# Patient Record
Sex: Male | Born: 1985 | Race: White | Hispanic: No | Smoking: Current every day smoker
Health system: Southern US, Community
[De-identification: ages and names within clinical notes are randomized; demographics above are authoritative.]

## PROBLEM LIST (undated history)

## (undated) DIAGNOSIS — R011 Cardiac murmur, unspecified: Secondary | ICD-10-CM

## (undated) DIAGNOSIS — M1990 Unspecified osteoarthritis, unspecified site: Secondary | ICD-10-CM

## (undated) DIAGNOSIS — F32A Depression, unspecified: Secondary | ICD-10-CM

## (undated) DIAGNOSIS — R519 Headache, unspecified: Secondary | ICD-10-CM

## (undated) DIAGNOSIS — R748 Abnormal levels of other serum enzymes: Secondary | ICD-10-CM

## (undated) DIAGNOSIS — M4800 Spinal stenosis, site unspecified: Secondary | ICD-10-CM

## (undated) DIAGNOSIS — F419 Anxiety disorder, unspecified: Secondary | ICD-10-CM

## (undated) DIAGNOSIS — T7840XA Allergy, unspecified, initial encounter: Secondary | ICD-10-CM

## (undated) DIAGNOSIS — B019 Varicella without complication: Secondary | ICD-10-CM

## (undated) DIAGNOSIS — R51 Headache: Secondary | ICD-10-CM

## (undated) HISTORY — PX: ADENOIDECTOMY: SUR15

## (undated) HISTORY — DX: Anxiety disorder, unspecified: F41.9

## (undated) HISTORY — DX: Allergy, unspecified, initial encounter: T78.40XA

## (undated) HISTORY — PX: TONSILLECTOMY: SUR1361

## (undated) HISTORY — DX: Varicella without complication: B01.9

## (undated) HISTORY — PX: SPINE SURGERY: SHX786

## (undated) HISTORY — DX: Depression, unspecified: F32.A

---
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
## 2008-03-02 ENCOUNTER — Emergency Department (HOSPITAL_COMMUNITY): Admission: EM | Admit: 2008-03-02 | Discharge: 2008-03-02 | Payer: Self-pay | Admitting: Emergency Medicine

## 2011-02-08 ENCOUNTER — Emergency Department (HOSPITAL_COMMUNITY)
Admission: EM | Admit: 2011-02-08 | Discharge: 2011-02-08 | Disposition: A | Discharge status: Home / Self Care | Payer: Self-pay | Attending: Emergency Medicine | Admitting: Emergency Medicine

## 2011-02-08 DIAGNOSIS — M545 Low back pain, unspecified: Secondary | ICD-10-CM | POA: Insufficient documentation

## 2011-02-08 DIAGNOSIS — S335XXA Sprain of ligaments of lumbar spine, initial encounter: Secondary | ICD-10-CM | POA: Insufficient documentation

## 2011-02-08 DIAGNOSIS — X58XXXA Exposure to other specified factors, initial encounter: Secondary | ICD-10-CM | POA: Insufficient documentation

## 2012-09-02 ENCOUNTER — Emergency Department (HOSPITAL_BASED_OUTPATIENT_CLINIC_OR_DEPARTMENT_OTHER): Payer: No Typology Code available for payment source

## 2012-09-02 ENCOUNTER — Emergency Department (HOSPITAL_BASED_OUTPATIENT_CLINIC_OR_DEPARTMENT_OTHER)
Admission: EM | Admit: 2012-09-02 | Discharge: 2012-09-02 | Disposition: A | Discharge status: Home / Self Care | Payer: No Typology Code available for payment source | Attending: Emergency Medicine | Admitting: Emergency Medicine

## 2012-09-02 ENCOUNTER — Encounter (HOSPITAL_BASED_OUTPATIENT_CLINIC_OR_DEPARTMENT_OTHER): Payer: Self-pay | Admitting: *Deleted

## 2012-09-02 DIAGNOSIS — F17200 Nicotine dependence, unspecified, uncomplicated: Secondary | ICD-10-CM | POA: Insufficient documentation

## 2012-09-02 DIAGNOSIS — R011 Cardiac murmur, unspecified: Secondary | ICD-10-CM | POA: Insufficient documentation

## 2012-09-02 DIAGNOSIS — Y9389 Activity, other specified: Secondary | ICD-10-CM | POA: Insufficient documentation

## 2012-09-02 DIAGNOSIS — R51 Headache: Secondary | ICD-10-CM | POA: Insufficient documentation

## 2012-09-02 DIAGNOSIS — Y92410 Unspecified street and highway as the place of occurrence of the external cause: Secondary | ICD-10-CM | POA: Insufficient documentation

## 2012-09-02 DIAGNOSIS — S139XXA Sprain of joints and ligaments of unspecified parts of neck, initial encounter: Secondary | ICD-10-CM | POA: Insufficient documentation

## 2012-09-02 DIAGNOSIS — M2550 Pain in unspecified joint: Secondary | ICD-10-CM | POA: Insufficient documentation

## 2012-09-02 DIAGNOSIS — S134XXA Sprain of ligaments of cervical spine, initial encounter: Secondary | ICD-10-CM

## 2012-09-02 HISTORY — DX: Cardiac murmur, unspecified: R01.1

## 2012-09-02 MED ORDER — HYDROCODONE-ACETAMINOPHEN 5-325 MG PO TABS
ORAL_TABLET | ORAL | Status: AC
  Administered 2012-09-02: 1
  Filled 2012-09-02: qty 1

## 2012-09-02 MED ORDER — CYCLOBENZAPRINE HCL 10 MG PO TABS: 10.0000 mg | ORAL_TABLET | Freq: Two times a day (BID) | ORAL | Status: DC | PRN

## 2012-09-02 MED ORDER — OXYCODONE-ACETAMINOPHEN 5-325 MG PO TABS: 1.0000 | ORAL_TABLET | Freq: Four times a day (QID) | ORAL | Status: DC | PRN

## 2012-09-02 MED ORDER — MORPHINE SULFATE 2 MG/ML IJ SOLN
2.0000 mg | Freq: Once | INTRAMUSCULAR | Status: AC
  Administered 2012-09-02: 2 mg via INTRAVENOUS
  Filled 2012-09-02: qty 1
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
## 2012-09-03 NOTE — ED Provider Notes (Signed)
Medical screening examination/treatment/procedure(s) were performed by non-physician practitioner and as supervising physician I was immediately available for consultation/collaboration.  Hurman Horn, MD 09/03/12 [phone]

## 2012-09-25 ENCOUNTER — Emergency Department (HOSPITAL_COMMUNITY)
Admission: EM | Admit: 2012-09-25 | Discharge: 2012-09-25 | Disposition: A | Discharge status: Home / Self Care | Payer: BC Managed Care – PPO | Attending: Emergency Medicine | Admitting: Emergency Medicine

## 2012-09-25 ENCOUNTER — Encounter (HOSPITAL_COMMUNITY): Payer: Self-pay | Admitting: *Deleted

## 2012-09-25 DIAGNOSIS — R059 Cough, unspecified: Secondary | ICD-10-CM | POA: Insufficient documentation

## 2012-09-25 DIAGNOSIS — R05 Cough: Secondary | ICD-10-CM | POA: Insufficient documentation

## 2012-09-25 DIAGNOSIS — J111 Influenza due to unidentified influenza virus with other respiratory manifestations: Secondary | ICD-10-CM | POA: Insufficient documentation

## 2012-09-25 DIAGNOSIS — F17200 Nicotine dependence, unspecified, uncomplicated: Secondary | ICD-10-CM | POA: Insufficient documentation

## 2012-09-25 DIAGNOSIS — Z8679 Personal history of other diseases of the circulatory system: Secondary | ICD-10-CM | POA: Insufficient documentation

## 2012-09-25 MED ORDER — OSELTAMIVIR PHOSPHATE 75 MG PO CAPS: ORAL_CAPSULE | ORAL | Status: DC

## 2012-09-25 NOTE — ED Notes (Signed)
Cough since last night.  This am fever of 103., sore throat nonproductive cough.

## 2012-09-25 NOTE — ED Notes (Signed)
Pt states that he woke up this am with fever of 103, did take 800mg  ibu and two tablets of daytime cold and flu prior to arrival in er, cough that started last night, is non productive, runny nose, watery eyes, denies diarrhea, states that he did have one episode of vomiting just "a little" when he was coughing this am

## 2012-09-25 NOTE — ED Provider Notes (Signed)
Medical screening examination/treatment/procedure(s) were performed by non-physician practitioner and as supervising physician I was immediately available for consultation/collaboration.  Wetona Viramontes R. Baani Bober, MD 09/25/12 2319 

## 2012-09-25 NOTE — ED Provider Notes (Signed)
History     CSN: [id number]  Arrival date & time 09/25/12  1454   First MD Initiated Contact with Patient 09/25/12 1653      Chief Complaint  Patient presents with  . Fever  . Cough    (Consider location/radiation/quality/duration/timing/severity/associated sxs/prior treatment) Patient is a 27 y.o. male presenting with fever and cough. The history is provided by the patient.  Fever Primary symptoms of the febrile illness include fever, fatigue and cough. Primary symptoms do not include headaches, wheezing, shortness of breath, abdominal pain, nausea, vomiting, dysuria, myalgias or arthralgias. The current episode started yesterday. This is a new problem.  The fever began yesterday. The fever has been gradually improving since its onset. The maximum temperature recorded prior to his arrival was 103 to 104 F. The temperature was taken by an oral thermometer.  Cough Pertinent negatives include no chest pain, no headaches, no myalgias, no shortness of breath and no wheezing.    Past Medical History  Diagnosis Date  . Heart murmur     Past Surgical History  Procedure Date  . Tonsillectomy   . Adenoidectomy     No family history on file.  History  Substance Use Topics  . Smoking status: Current Every Day Smoker  . Smokeless tobacco: Not on file  . Alcohol Use: No      Review of Systems  Constitutional: Positive for fever and fatigue. Negative for activity change.       All ROS Neg except as noted in HPI  HENT: Negative for nosebleeds and neck pain.   Eyes: Negative for photophobia and discharge.  Respiratory: Positive for cough. Negative for shortness of breath and wheezing.   Cardiovascular: Negative for chest pain and palpitations.  Gastrointestinal: Negative for nausea, vomiting, abdominal pain and blood in stool.  Genitourinary: Negative for dysuria, frequency and hematuria.  Musculoskeletal: Negative for myalgias, back pain and arthralgias.  Skin: Negative.     Neurological: Negative for dizziness, seizures, speech difficulty and headaches.  Psychiatric/Behavioral: Negative for hallucinations and confusion.    Allergies  Review of patient's allergies indicates no known allergies.  Home Medications   Current Outpatient Rx  Name  Route  Sig  Dispense  Refill  . IBUPROFEN 800 MG PO TABS   Oral   Take 800 mg by mouth every 8 (eight) hours as needed. fever         . DAYQUIL/NYQUIL COLD/FLU RELIEF PO   Oral   Take 1 tablet by mouth every 6 (six) hours as needed. Fever/aches and pain           BP 156/76  Pulse 106  Temp 98 F (36.7 C) (Oral)  Resp 20  Ht 6\' 2"  (1.88 m)  Wt 185 lb (83.915 kg)  BMI 23.75 kg/m2  SpO2 98%  Physical Exam  Nursing note and vitals reviewed. Constitutional: He is oriented to person, place, and time. He appears well-developed and well-nourished.  Non-toxic appearance.  HENT:  Head: Normocephalic.  Right Ear: Tympanic membrane and external ear normal.  Left Ear: Tympanic membrane and external ear normal.       Nasal congestion  Eyes: EOM and lids are normal. Pupils are equal, round, and reactive to light.  Neck: Normal range of motion. Neck supple. Carotid bruit is not present.  Cardiovascular: Normal heart sounds, intact distal pulses and normal pulses.  Tachycardia present.   Pulmonary/Chest: Breath sounds normal. No respiratory distress.  Abdominal: Soft. Bowel sounds are normal. There is no tenderness. There  is no guarding.  Musculoskeletal: Normal range of motion.  Lymphadenopathy:       Head (right side): No submandibular adenopathy present.       Head (left side): No submandibular adenopathy present.    He has no cervical adenopathy.  Neurological: He is alert and oriented to person, place, and time. He has normal strength. No cranial nerve deficit or sensory deficit.  Skin: Skin is warm and dry.  Psychiatric: He has a normal mood and affect. His speech is normal.    ED Course  Procedures  (including critical care time)  Labs Reviewed - No data to display No results found.   No diagnosis found.    MDM  I have reviewed nursing notes, vital signs, and all appropriate lab and imaging results for this patient. Patient presents to the emergency department with cough, congestion, and fever max of 103. States he is generally not feeling well. Suspect patient has influenza. Patient advised to increase fluids. Wash hands frequently. Tamiflu ordered for the patient being a.       Jay Montgomery, [name] 09/25/12 1724

## 2013-10-17 ENCOUNTER — Encounter (HOSPITAL_COMMUNITY): Payer: Self-pay | Admitting: Emergency Medicine

## 2013-10-17 ENCOUNTER — Emergency Department (HOSPITAL_COMMUNITY)
Admission: EM | Admit: 2013-10-17 | Discharge: 2013-10-17 | Disposition: A | Discharge status: Home / Self Care | Payer: No Typology Code available for payment source | Attending: Emergency Medicine | Admitting: Emergency Medicine

## 2013-10-17 DIAGNOSIS — M545 Low back pain, unspecified: Secondary | ICD-10-CM | POA: Insufficient documentation

## 2013-10-17 DIAGNOSIS — M549 Dorsalgia, unspecified: Secondary | ICD-10-CM

## 2013-10-17 DIAGNOSIS — F17200 Nicotine dependence, unspecified, uncomplicated: Secondary | ICD-10-CM | POA: Insufficient documentation

## 2013-10-17 DIAGNOSIS — R011 Cardiac murmur, unspecified: Secondary | ICD-10-CM | POA: Insufficient documentation

## 2013-10-17 MED ORDER — BACLOFEN 10 MG PO TABS: 10.0000 mg | ORAL_TABLET | Freq: Three times a day (TID) | ORAL | Status: DC

## 2013-10-17 MED ORDER — DICLOFENAC SODIUM 75 MG PO TBEC: 75.0000 mg | DELAYED_RELEASE_TABLET | Freq: Two times a day (BID) | ORAL | Status: DC

## 2013-10-17 MED ORDER — DEXAMETHASONE 6 MG PO TABS: ORAL_TABLET | ORAL | Status: DC

## 2013-10-17 NOTE — ED Provider Notes (Signed)
Medical screening examination/treatment/procedure(s) were performed by non-physician practitioner and as supervising physician I was immediately available for consultation/collaboration.  EKG Interpretation   None         Mervin Kung, MD 10/17/13 [phone]

## 2013-10-17 NOTE — ED Notes (Signed)
Pt c/o mid lower back pain x3 days. Pt has hx of back pain and denies any new injury.

## 2013-10-17 NOTE — ED Provider Notes (Signed)
CSN: [id number]     Arrival date & time 10/17/13  0756 History   First MD Initiated Contact with Patient 10/17/13 [phone]     Chief Complaint  Patient presents with  . Back Pain   (Consider location/radiation/quality/duration/timing/severity/associated sxs/prior Treatment) Patient is a 28 y.o. male presenting with back pain. The history is provided by the patient.  Back Pain Location:  Lumbar spine Quality:  Aching Radiates to:  Does not radiate Pain severity:  Moderate Pain is:  Same all the time Onset quality:  Gradual Duration:  3 days Progression:  Worsening Chronicity:  Chronic Context: lifting heavy objects   Relieved by:  Nothing Worsened by:  Nothing tried Ineffective treatments:  None tried Associated symptoms: no abdominal pain, no bladder incontinence, no bowel incontinence, no chest pain, no dysuria, no perianal numbness and no tingling     Past Medical History  Diagnosis Date  . Heart murmur    Past Surgical History  Procedure Laterality Date  . Tonsillectomy    . Adenoidectomy     History reviewed. No pertinent family history. History  Substance Use Topics  . Smoking status: Current Every Day Smoker  . Smokeless tobacco: Not on file  . Alcohol Use: No    Review of Systems  Constitutional: Negative for activity change.       All ROS Neg except as noted in HPI  HENT: Negative for nosebleeds.   Eyes: Negative for photophobia and discharge.  Respiratory: Negative for cough, shortness of breath and wheezing.   Cardiovascular: Negative for chest pain and palpitations.  Gastrointestinal: Negative for abdominal pain, blood in stool and bowel incontinence.  Genitourinary: Negative for bladder incontinence, dysuria, frequency and hematuria.  Musculoskeletal: Positive for back pain. Negative for arthralgias and neck pain.  Skin: Negative.   Neurological: Negative for dizziness, tingling, seizures and speech difficulty.  Psychiatric/Behavioral: Negative for  hallucinations and confusion.    Allergies  Review of patient's allergies indicates no known allergies.  Home Medications   Current Outpatient Rx  Name  Route  Sig  Dispense  Refill  . ibuprofen (ADVIL,MOTRIN) 800 MG tablet   Oral   Take 800 mg by mouth every 8 (eight) hours as needed. fever         . oseltamivir (TAMIFLU) 75 MG capsule      1 po bid with food   10 capsule   0   . Pseudoeph-Doxylamine-DM-APAP (DAYQUIL/NYQUIL COLD/FLU RELIEF PO)   Oral   Take 1 tablet by mouth every 6 (six) hours as needed. Fever/aches and pain          BP 145/83  Pulse 72  Temp(Src) 97.6 F (36.4 C) (Oral)  Resp 18  SpO2 100% Physical Exam  Nursing note and vitals reviewed. Constitutional: He is oriented to person, place, and time. He appears well-developed and well-nourished.  Non-toxic appearance.  HENT:  Head: Normocephalic.  Right Ear: Tympanic membrane and external ear normal.  Left Ear: Tympanic membrane and external ear normal.  Eyes: EOM and lids are normal. Pupils are equal, round, and reactive to light.  Neck: Normal range of motion. Neck supple. Carotid bruit is not present.  Cardiovascular: Normal rate, regular rhythm, normal heart sounds, intact distal pulses and normal pulses.   Pulmonary/Chest: Breath sounds normal. No respiratory distress.  Abdominal: Soft. Bowel sounds are normal. There is no tenderness. There is no guarding.  Musculoskeletal: Normal range of motion.       Lumbar back: He exhibits tenderness and pain.  Lymphadenopathy:       Head (right side): No submandibular adenopathy present.       Head (left side): No submandibular adenopathy present.    He has no cervical adenopathy.  Neurological: He is alert and oriented to person, place, and time. He has normal strength. No cranial nerve deficit or sensory deficit.  Skin: Skin is warm and dry.  Psychiatric: He has a normal mood and affect. His speech is normal.    ED Course  Procedures (including  critical care time) Labs Review Labs Reviewed - No data to display Imaging Review No results found.  EKG Interpretation   None       MDM  No diagnosis found. **I have reviewed nursing notes, vital signs, and all appropriate lab and imaging results for this patient.*  Pt has a long term history of back pain. Pain worse over the past 3 days. Pt believes this is due to the type of work he does. No gross neuro deficits noted. Rx for diclofenac, decadron, and baclofen given to the patient. Pt encouraged to establish a primary MD for management of this issue.   Lenox Ahr, PA-C 10/17/13 [phone]

## 2013-10-17 NOTE — Discharge Instructions (Signed)
There were no gross neurologic deficits appreciated on your examination today. Please apply heat to your lower back. Please use medications as prescribed. Baclofen may cause drowsiness, please use with caution. Please see the physician listed above, or the physician of your choice to establish a primary care physician to manage her back issues. Back Pain, Adult Low back pain is very common. About 1 in 5 people have back pain.The cause of low back pain is rarely dangerous. The pain often gets better over time.About half of people with a sudden onset of back pain feel better in just 2 weeks. About 8 in 10 people feel better by 6 weeks.  CAUSES Some common causes of back pain include:  Strain of the muscles or ligaments supporting the spine.  Wear and tear (degeneration) of the spinal discs.  Arthritis.  Direct injury to the back. DIAGNOSIS Most of the time, the direct cause of low back pain is not known.However, back pain can be treated effectively even when the exact cause of the pain is unknown.Answering your caregiver's questions about your overall health and symptoms is one of the most accurate ways to make sure the cause of your pain is not dangerous. If your caregiver needs more information, he or she may order lab work or imaging tests (X-rays or MRIs).However, even if imaging tests show changes in your back, this usually does not require surgery. HOME CARE INSTRUCTIONS For many people, back pain returns.Since low back pain is rarely dangerous, it is often a condition that people can learn to [facility] their own.   Remain active. It is stressful on the back to sit or stand in one place. Do not sit, drive, or stand in one place for more than 30 minutes at a time. Take short walks on level surfaces as soon as pain allows.Try to increase the length of time you walk each day.  Do not stay in bed.Resting more than 1 or 2 days can delay your recovery.  Do not avoid exercise or work.Your  body is made to move.It is not dangerous to be active, even though your back may hurt.Your back will likely heal faster if you return to being active before your pain is gone.  Pay attention to your body when you bend and lift. Many people have less discomfortwhen lifting if they bend their knees, keep the load close to their bodies,and avoid twisting. Often, the most comfortable positions are those that put less stress on your recovering back.  Find a comfortable position to sleep. Use a firm mattress and lie on your side with your knees slightly bent. If you lie on your back, put a pillow under your knees.  Only take over-the-counter or prescription medicines as directed by your caregiver. Over-the-counter medicines to reduce pain and inflammation are often the most helpful.Your caregiver may prescribe muscle relaxant drugs.These medicines help dull your pain so you can more quickly return to your normal activities and healthy exercise.  Put ice on the injured area.  Put ice in a plastic bag.  Place a towel between your skin and the bag.  Leave the ice on for 15-20 minutes, 03-04 times a day for the first 2 to 3 days. After that, ice and heat may be alternated to reduce pain and spasms.  Ask your caregiver about trying back exercises and gentle massage. This may be of some benefit.  Avoid feeling anxious or stressed.Stress increases muscle tension and can worsen back pain.It is important to recognize when you  are anxious or stressed and learn ways to manage it.Exercise is a great option. SEEK MEDICAL CARE IF:  You have pain that is not relieved with rest or medicine.  You have pain that does not improve in 1 week.  You have new symptoms.  You are generally not feeling well. SEEK IMMEDIATE MEDICAL CARE IF:   You have pain that radiates from your back into your legs.  You develop new bowel or bladder control problems.  You have unusual weakness or numbness in your arms or  legs.  You develop nausea or vomiting.  You develop abdominal pain.  You feel faint. Document Released: 09/06/2005 Document Revised: 03/07/2012 Document Reviewed: 01/25/2011 [facility] Patient Information 2014 [place], [place].

## 2013-10-22 ENCOUNTER — Emergency Department (HOSPITAL_COMMUNITY)
Admission: EM | Admit: 2013-10-22 | Discharge: 2013-10-22 | Disposition: A | Discharge status: Home / Self Care | Payer: Self-pay | Attending: Emergency Medicine | Admitting: Emergency Medicine

## 2013-10-22 ENCOUNTER — Encounter (HOSPITAL_COMMUNITY): Payer: Self-pay | Admitting: Emergency Medicine

## 2013-10-22 DIAGNOSIS — Z791 Long term (current) use of non-steroidal anti-inflammatories (NSAID): Secondary | ICD-10-CM | POA: Insufficient documentation

## 2013-10-22 DIAGNOSIS — R011 Cardiac murmur, unspecified: Secondary | ICD-10-CM | POA: Insufficient documentation

## 2013-10-22 DIAGNOSIS — F17200 Nicotine dependence, unspecified, uncomplicated: Secondary | ICD-10-CM | POA: Insufficient documentation

## 2013-10-22 DIAGNOSIS — Z79899 Other long term (current) drug therapy: Secondary | ICD-10-CM | POA: Insufficient documentation

## 2013-10-22 DIAGNOSIS — M549 Dorsalgia, unspecified: Secondary | ICD-10-CM | POA: Insufficient documentation

## 2013-10-22 MED ORDER — TRAMADOL HCL 50 MG PO TABS: 50.0000 mg | ORAL_TABLET | Freq: Four times a day (QID) | ORAL | Status: DC | PRN

## 2013-10-22 NOTE — ED Notes (Signed)
PT ambulated with baseline gait; VSS; A&Ox3; no signs of distress; respirations even and unlabored; skin warm and dry; no questions upon discharge.  

## 2013-10-22 NOTE — ED Notes (Signed)
Presents with lower back pain began Sunday after trying to lift and move a heavy box. Pain is worse with movement and better with rest. CMS intact.

## 2013-10-22 NOTE — ED Notes (Signed)
Pt. Stated, i went to lift something and it was heavier than I thought and it didn't move and it pulled my lower back.  Nursed it for a week and no better.

## 2013-10-22 NOTE — ED Provider Notes (Signed)
CSN: [id number]     Arrival date & time 10/22/13  1816 History  This chart was scribed for non-physician practitioner Margarita Mail, PA-C, working with Leota Jacobsen, MD by Vernell Barrier, ED scribe. This patient was seen in room TR10C/TR10C and the patient's care was started at 6:56 PM.    Chief Complaint  Patient presents with  . Back Pain   The history is provided by the patient. No language interpreter was used.   HPI Comments: Jay Montgomery is a 28 y.o. male who presents to the Emergency Department complaining of back pain. Pain worse with rotation, and flexion of the waist. Staying still for long periods of time make his back stiff. Pt states he went to pick up a box and states the box was heavier than he thought. Pt was seen on 09/2813 for back pain and was prescribed diclofenac; was also given steroid shot in ED. Pt states he was taking them for pain but with little relief. Never been evaluated by orthopedist. Pt denies the use of IV drugs. Pt does not have a PCP. Pt used to smoke but admits he has quit. Pt works at [employer]; states job includes heavy lifting daily. Denies rash, sensitivity to light, bowel/bladder incontinence, numbness or tingling down legs.   Past Medical History  Diagnosis Date  . Heart murmur    Past Surgical History  Procedure Laterality Date  . Tonsillectomy    . Adenoidectomy     History reviewed. No pertinent family history. History  Substance Use Topics  . Smoking status: Current Every Day Smoker  . Smokeless tobacco: Not on file  . Alcohol Use: No    Review of Systems  Constitutional: Negative for fever and chills.  Eyes: Negative for photophobia.  Genitourinary: Negative for enuresis.  Musculoskeletal: Positive for back pain. Negative for gait problem and myalgias.  Skin: Negative for color change and rash.  Neurological: Negative for weakness and numbness.   Allergies  Review of patient's allergies indicates no known  allergies.  Home Medications   Current Outpatient Rx  Name  Route  Sig  Dispense  Refill  . ibuprofen (ADVIL,MOTRIN) 400 MG tablet   Oral   Take 400 mg by mouth 2 (two) times daily as needed (pain).         . Multiple Vitamin (MULTIVITAMIN) tablet   Oral   Take 1 tablet by mouth daily.          Triage Vitals: BP 155/78  Pulse 84  Temp(Src) 98 F (36.7 C) (Oral)  SpO2 99% Physical Exam  Nursing note and vitals reviewed. Constitutional: He is oriented to person, place, and time. He appears well-developed and well-nourished. No distress.  HENT:  Head: Normocephalic and atraumatic.  Eyes: Conjunctivae and EOM are normal.  Neck: Neck supple. No thyromegaly present.  Cardiovascular: Normal rate.   Pulmonary/Chest: Effort normal. No respiratory distress.  Musculoskeletal: Normal range of motion.  BACK: Tenderness along paraspinal muscles and  point tenderness bilaterally over SI joints  Lymphadenopathy:    He has no cervical adenopathy.  Neurological: He is alert and oriented to person, place, and time.  Skin: Skin is warm and dry.  Psychiatric: He has a normal mood and affect. His behavior is normal.    ED Course  Procedures (including critical care time) DIAGNOSTIC STUDIES: Oxygen Saturation is 99% on room air, noraml by my interpretation.    COORDINATION OF CARE: At 7:01 PM: Discussed treatment plan with patient which includes  a shot of decadron and a referral to an orthopedist per pt request. Patient agrees.   Labs Review Labs Reviewed - No data to display Imaging Review No results found.  EKG Interpretation   None       MDM   1. Back pain    Patient with back pain.  No neurological deficits and normal neuro exam.  Patient can walk but states is painful.  No loss of bowel or bladder control.  No concern for cauda equina.  No fever, night sweats, weight loss, h/o cancer, IVDU.  [place] protocol and pain medicine indicated and discussed with patient.   I  personally performed the services described in this documentation, which was scribed in my presence. The recorded information has been reviewed and is accurate.       Margarita Mail, PA-C 10/24/13 1110

## 2013-10-22 NOTE — Discharge Instructions (Signed)
SEEK IMMEDIATE MEDICAL ATTENTION IF: New numbness, tingling, weakness, or problem with the use of your arms or legs.  Severe back pain not relieved with medications.  Change in bowel or bladder control.  Increasing pain in any areas of the body (such as chest or abdominal pain).  Shortness of breath, dizziness or fainting.  Nausea (feeling sick to your stomach), vomiting, fever, or sweats.  Back Exercises Back exercises help treat and prevent back injuries. The goal of back exercises is to increase the strength of your abdominal and back muscles and the flexibility of your back. These exercises should be started when you no longer have back pain. Back exercises include:  Pelvic Tilt. Lie on your back with your knees bent. Tilt your pelvis until the lower part of your back is against the floor. Hold this position 5 to 10 sec and repeat 5 to 10 times.  Knee to Chest. Pull first 1 knee up against your chest and hold for 20 to 30 seconds, repeat this with the other knee, and then both knees. This may be done with the other leg straight or bent, whichever feels better.  Sit-Ups or Curl-Ups. Bend your knees 90 degrees. Start with tilting your pelvis, and do a partial, slow sit-up, lifting your trunk only 30 to 45 degrees off the floor. Take at least 2 to 3 seconds for each sit-up. Do not do sit-ups with your knees out straight. If partial sit-ups are difficult, simply do the above but with only tightening your abdominal muscles and holding it as directed.  Hip-Lift. Lie on your back with your knees flexed 90 degrees. Push down with your feet and shoulders as you raise your hips a couple inches off the floor; hold for 10 seconds, repeat 5 to 10 times.  Back arches. Lie on your stomach, propping yourself up on bent elbows. Slowly press on your hands, causing an arch in your low back. Repeat 3 to 5 times. Any initial stiffness and discomfort should lessen with repetition over time.  Shoulder-Lifts. Lie  face down with arms beside your body. Keep hips and torso pressed to floor as you slowly lift your head and shoulders off the floor. Do not overdo your exercises, especially in the beginning. Exercises may cause you some mild back discomfort which lasts for a few minutes; however, if the pain is more severe, or lasts for more than 15 minutes, do not continue exercises until you see your caregiver. Improvement with exercise therapy for back problems is slow.  See your caregivers for assistance with developing a proper back exercise program. Document Released: 10/14/2004 Document Revised: 11/29/2011 Document Reviewed: 07/08/2011 [facility] Patient Information 2014 [place].  Back Injury Prevention Back injuries can be extremely painful and difficult to heal. After having one back injury, you are much more likely to experience another later on. It is important to learn how to avoid injuring or re-injuring your back. The following tips can help you to prevent a back injury. PHYSICAL FITNESS  Exercise regularly and try to develop good tone in your abdominal muscles. Your abdominal muscles provide a lot of the support needed by your back.  Do aerobic exercises (walking, jogging, biking, swimming) regularly.  Do exercises that increase balance and strength (tai chi, yoga) regularly. This can decrease your risk of falling and injuring your back.  Stretch before and after exercising.  Maintain a healthy weight. The more you weigh, the more stress is placed on your back. For every pound of weight,  10 times that amount of pressure is placed on the back. DIET  Talk to your caregiver about how much calcium and vitamin D you need per day. These nutrients help to prevent weakening of the bones (osteoporosis). Osteoporosis can cause broken (fractured) bones that lead to back pain.  Include good sources of calcium in your diet, such as dairy products, green, leafy vegetables, and products with calcium  added (fortified).  Include good sources of vitamin D in your diet, such as milk and foods that are fortified with vitamin D.  Consider taking a nutritional supplement or a multivitamin if needed.  Stop smoking if you smoke. POSTURE  Sit and stand up straight. Avoid leaning forward when you sit or hunching over when you stand.  Choose chairs with good low back (lumbar) support.  If you work at a desk, sit close to your work so you do not need to lean over. Keep your chin tucked in. Keep your neck drawn back and elbows bent at a right angle. Your arms should look like the letter "L."  Sit high and close to the steering wheel when you drive. Add a lumbar support to your car seat if needed.  Avoid sitting or standing in one position for too long. Take breaks to get up, stretch, and walk around at least once every hour. Take breaks if you are driving for long periods of time.  Sleep on your side with your knees slightly bent, or sleep on your back with a pillow under your knees. Do not sleep on your stomach. LIFTING, TWISTING, AND REACHING  Avoid heavy lifting, especially repetitive lifting. If you must do heavy lifting:  Stretch before lifting.  Work slowly.  Rest between lifts.  Use carts and dollies to move objects when possible.  Make several small trips instead of carrying 1 heavy load.  Ask for help when you need it.  Ask for help when moving big, awkward objects.  Follow these steps when lifting:  Stand with your feet shoulder-width apart.  Get as close to the object as you can. Do not try to pick up heavy objects that are far from your body.  Use handles or lifting straps if they are available.  Bend at your knees. Squat down, but keep your heels off the floor.  Keep your shoulders pulled back, your chin tucked in, and your back straight.  Lift the object slowly, tightening the muscles in your legs, abdomen, and buttocks. Keep the object as close to the center of  your body as possible.  When you put a load down, use these same guidelines in reverse.  Do not:  Lift the object above your waist.  Twist at the waist while lifting or carrying a load. Move your feet if you need to turn, not your waist.  Bend over without bending at your knees.  Avoid reaching over your head, across a table, or for an object on a high surface. OTHER TIPS  Avoid wet floors and keep sidewalks clear of ice to prevent falls.  Do not sleep on a mattress that is too soft or too hard.  Keep items that are used frequently within easy reach.  Put heavier objects on shelves at waist level and lighter objects on lower or higher shelves.  Find ways to decrease your stress, such as exercise, massage, or relaxation techniques. Stress can build up in your muscles. Tense muscles are more vulnerable to injury.  Seek treatment for depression or anxiety if needed.  These conditions can increase your risk of developing back pain. SEEK MEDICAL CARE IF:  You injure your back.  You have questions about diet, exercise, or other ways to prevent back injuries. MAKE SURE YOU:  Understand these instructions.  Will watch your condition.  Will get help right away if you are not doing well or get worse. Document Released: 10/14/2004 Document Revised: 11/29/2011 Document Reviewed: 10/18/2011 [facility] Patient Information 2014 [place], [place].  Back Pain, Adult Low back pain is very common. About 1 in 5 people have back pain.The cause of low back pain is rarely dangerous. The pain often gets better over time.About half of people with a sudden onset of back pain feel better in just 2 weeks. About 8 in 10 people feel better by 6 weeks.  CAUSES Some common causes of back pain include:  Strain of the muscles or ligaments supporting the spine.  Wear and tear (degeneration) of the spinal discs.  Arthritis.  Direct injury to the back. DIAGNOSIS Most of the time, the direct cause of  low back pain is not known.However, back pain can be treated effectively even when the exact cause of the pain is unknown.Answering your caregiver's questions about your overall health and symptoms is one of the most accurate ways to make sure the cause of your pain is not dangerous. If your caregiver needs more information, he or she may order lab work or imaging tests (X-rays or MRIs).However, even if imaging tests show changes in your back, this usually does not require surgery. HOME CARE INSTRUCTIONS For many people, back pain returns.Since low back pain is rarely dangerous, it is often a condition that people can learn to [facility] their own.   Remain active. It is stressful on the back to sit or stand in one place. Do not sit, drive, or stand in one place for more than 30 minutes at a time. Take short walks on level surfaces as soon as pain allows.Try to increase the length of time you walk each day.  Do not stay in bed.Resting more than 1 or 2 days can delay your recovery.  Do not avoid exercise or work.Your body is made to move.It is not dangerous to be active, even though your back may hurt.Your back will likely heal faster if you return to being active before your pain is gone.  Pay attention to your body when you bend and lift. Many people have less discomfortwhen lifting if they bend their knees, keep the load close to their bodies,and avoid twisting. Often, the most comfortable positions are those that put less stress on your recovering back.  Find a comfortable position to sleep. Use a firm mattress and lie on your side with your knees slightly bent. If you lie on your back, put a pillow under your knees.  Only take over-the-counter or prescription medicines as directed by your caregiver. Over-the-counter medicines to reduce pain and inflammation are often the most helpful.Your caregiver may prescribe muscle relaxant drugs.These medicines help dull your pain so you can more  quickly return to your normal activities and healthy exercise.  Put ice on the injured area.  Put ice in a plastic bag.  Place a towel between your skin and the bag.  Leave the ice on for 15-20 minutes, 03-04 times a day for the first 2 to 3 days. After that, ice and heat may be alternated to reduce pain and spasms.  Ask your caregiver about trying back exercises and gentle massage. This may be  of some benefit.  Avoid feeling anxious or stressed.Stress increases muscle tension and can worsen back pain.It is important to recognize when you are anxious or stressed and learn ways to manage it.Exercise is a great option. SEEK MEDICAL CARE IF:  You have pain that is not relieved with rest or medicine.  You have pain that does not improve in 1 week.  You have new symptoms.  You are generally not feeling well. SEEK IMMEDIATE MEDICAL CARE IF:   You have pain that radiates from your back into your legs.  You develop new bowel or bladder control problems.  You have unusual weakness or numbness in your arms or legs.  You develop nausea or vomiting.  You develop abdominal pain.  You feel faint. Document Released: 09/06/2005 Document Revised: 03/07/2012 Document Reviewed: 01/25/2011 [facility] Patient Information 2014 [place], [place].  Lumbosacral Strain Lumbosacral strain is a strain of any of the parts that make up your lumbosacral vertebrae. Your lumbosacral vertebrae are the bones that make up the lower third of your backbone. Your lumbosacral vertebrae are held together by muscles and tough, fibrous tissue (ligaments).  CAUSES  A sudden blow to your back can cause lumbosacral strain. Also, anything that causes an excessive stretch of the muscles in the low back can cause this strain. This is typically seen when people exert themselves strenuously, fall, lift heavy objects, bend, or crouch repeatedly. RISK FACTORS  Physically demanding work.  Participation in pushing or pulling  sports or sports that require sudden twist of the back (tennis, golf, baseball).  Weight lifting.  Excessive lower back curvature.  Forward-tilted pelvis.  Weak back or abdominal muscles or both.  Tight hamstrings. SIGNS AND SYMPTOMS  Lumbosacral strain may cause pain in the area of your injury or pain that moves (radiates) down your leg.  DIAGNOSIS Your health care provider can often diagnose lumbosacral strain through a physical exam. In some cases, you may need tests such as X-ray exams.  TREATMENT  Treatment for your lower back injury depends on many factors that your clinician will have to evaluate. However, most treatment will include the use of anti-inflammatory medicines. HOME CARE INSTRUCTIONS   Avoid hard physical activities (tennis, racquetball, waterskiing) if you are not in proper physical condition for it. This may aggravate or create problems.  If you have a back problem, avoid sports requiring sudden body movements. Swimming and walking are generally safer activities.  Maintain good posture.  Maintain a healthy weight.  For acute conditions, you may put ice on the injured area.  Put ice in a plastic bag.  Place a towel between your skin and the bag.  Leave the ice on for 20 minutes, 2 3 times a day.  When the low back starts healing, stretching and strengthening exercises may be recommended. SEEK MEDICAL CARE IF:  Your back pain is getting worse.  You experience severe back pain not relieved with medicines. SEEK IMMEDIATE MEDICAL CARE IF:   You have numbness, tingling, weakness, or problems with the use of your arms or legs.  There is a change in bowel or bladder control.  You have increasing pain in any area of the body, including your belly (abdomen).  You notice shortness of breath, dizziness, or feel faint.  You feel sick to your stomach (nauseous), are throwing up (vomiting), or become sweaty.  You notice discoloration of your toes or legs, or  your feet get very cold. MAKE SURE YOU:   Understand these instructions.  Will watch  your condition.  Will get help right away if you are not doing well or get worse. Document Released: 06/16/2005 Document Revised: 06/27/2013 Document Reviewed: 04/25/2013 [facility] Patient Information 2014 [place], [place].

## 2013-10-23 ENCOUNTER — Telehealth: Payer: Self-pay | Admitting: Orthopedic Surgery

## 2013-10-23 NOTE — Telephone Encounter (Signed)
Jay Montgomery requested an appointment for an ER visit to [facility] 10/17/13  For back pain.  He also went to [facility] ER 10/22/13 for same problem. No xrays were done at either place.  Told him you were not on call 10/17/13 but would ask you to review.  He said he was told at [facility] ER to follow up with Dr. Doran Durand but that office told him to call here.  He will be self pay and the policy was explained to him. Please advise.  His # [phone]

## 2013-10-23 NOTE — Telephone Encounter (Signed)
He can come in for 1 visit IF he has ALL of the fee  Make sure he knows its for 1 visit and i don't follow people with back pain

## 2013-10-23 NOTE — Telephone Encounter (Signed)
Called the patient, he does not want to schedule for only 1 apointment

## 2013-10-27 NOTE — ED Provider Notes (Signed)
Medical screening examination/treatment/procedure(s) were performed by non-physician practitioner and as supervising physician I was immediately available for consultation/collaboration.  Leota Jacobsen, MD 10/27/13 [phone]

## 2014-03-24 ENCOUNTER — Emergency Department (HOSPITAL_COMMUNITY): Payer: 59

## 2014-03-24 ENCOUNTER — Encounter (HOSPITAL_COMMUNITY): Payer: Self-pay | Admitting: Emergency Medicine

## 2014-03-24 ENCOUNTER — Emergency Department (HOSPITAL_COMMUNITY)
Admission: EM | Admit: 2014-03-24 | Discharge: 2014-03-24 | Disposition: A | Discharge status: Home / Self Care | Payer: 59 | Attending: Emergency Medicine | Admitting: Emergency Medicine

## 2014-03-24 DIAGNOSIS — R0789 Other chest pain: Secondary | ICD-10-CM

## 2014-03-24 DIAGNOSIS — R011 Cardiac murmur, unspecified: Secondary | ICD-10-CM | POA: Insufficient documentation

## 2014-03-24 DIAGNOSIS — R071 Chest pain on breathing: Secondary | ICD-10-CM | POA: Insufficient documentation

## 2014-03-24 DIAGNOSIS — Z87891 Personal history of nicotine dependence: Secondary | ICD-10-CM | POA: Insufficient documentation

## 2014-03-24 LAB — I-STAT CHEM 8, ED
BUN: 16 mg/dL (ref 6–23)
CALCIUM ION: 1.18 mmol/L (ref 1.12–1.23)
Chloride: 101 mEq/L (ref 96–112)
Creatinine, Ser: 1.2 mg/dL (ref 0.50–1.35)
Glucose, Bld: 95 mg/dL (ref 70–99)
HCT: 42 % (ref 39.0–52.0)
HEMOGLOBIN: 14.3 g/dL (ref 13.0–17.0)
Potassium: 3.7 mEq/L (ref 3.7–5.3)
SODIUM: 136 meq/L — AB (ref 137–147)
TCO2: 24 mmol/L (ref 0–100)

## 2014-03-24 LAB — I-STAT TROPONIN, ED: TROPONIN I, POC: 0 ng/mL (ref 0.00–0.08)

## 2014-03-24 MED ORDER — METHOCARBAMOL 500 MG PO TABS: 1000.0000 mg | ORAL_TABLET | Freq: Four times a day (QID) | ORAL | Status: DC | PRN

## 2014-03-24 MED ORDER — OXYCODONE-ACETAMINOPHEN 5-325 MG PO TABS
1.0000 | ORAL_TABLET | Freq: Once | ORAL | Status: AC
  Administered 2014-03-24: 1 via ORAL
  Filled 2014-03-24: qty 1

## 2014-03-24 MED ORDER — NAPROXEN 250 MG PO TABS: 250.0000 mg | ORAL_TABLET | Freq: Two times a day (BID) | ORAL | Status: DC

## 2014-03-24 MED ORDER — HYDROCODONE-ACETAMINOPHEN 5-325 MG PO TABS: ORAL_TABLET | ORAL | Status: DC

## 2014-03-24 MED ORDER — IBUPROFEN 400 MG PO TABS
400.0000 mg | ORAL_TABLET | Freq: Once | ORAL | Status: AC
  Administered 2014-03-24: 400 mg via ORAL
  Filled 2014-03-24: qty 1

## 2014-03-24 NOTE — ED Provider Notes (Signed)
CSN: [id number]     Arrival date & time 03/24/14  1647 History   First MD Initiated Contact with Patient 03/24/14 1719     Chief Complaint  Patient presents with  . Chest Pain     HPI Pt was seen at 1715. Per pt, c/o gradual onset and persistence of constant right upper chest wall "pain" for the past 2 days. Pain worsens with palpation of the area and body position changes. Describes the pain as "stabbing" and "constant." Cannot recall injury. Denies palpitations, no SOB/cough, no abd pain, no N/V/D, no back pain, no rash, no fevers.    Past Medical History  Diagnosis Date  . Heart murmur    Past Surgical History  Procedure Laterality Date  . Tonsillectomy    . Adenoidectomy      History  Substance Use Topics  . Smoking status: Former Research scientist (life sciences)  . Smokeless tobacco: Not on file  . Alcohol Use: No    Review of Systems ROS: Statement: All systems negative except as marked or noted in the HPI; Constitutional: Negative for fever and chills. ; ; Eyes: Negative for eye pain, redness and discharge. ; ; ENMT: Negative for ear pain, hoarseness, nasal congestion, sinus pressure and sore throat. ; ; Cardiovascular: Negative for palpitations, diaphoresis, dyspnea and peripheral edema. ; ; Respiratory: Negative for cough, wheezing and stridor. ; ; Gastrointestinal: Negative for nausea, vomiting, diarrhea, abdominal pain, blood in stool, hematemesis, jaundice and rectal bleeding. . ; ; Genitourinary: Negative for dysuria, flank pain and hematuria. ; ; Musculoskeletal: +chest wall pain. Negative for back pain and neck pain. Negative for swelling and trauma.; ; Skin: Negative for pruritus, rash, abrasions, blisters, bruising and skin lesion.; ; Neuro: Negative for headache, lightheadedness and neck stiffness. Negative for weakness, altered level of consciousness , altered mental status, extremity weakness, paresthesias, involuntary movement, seizure and syncope.      Allergies  Review of patient's  allergies indicates no known allergies.  Home Medications   Prior to Admission medications   Not on File   BP 125/90  Pulse 73  Temp(Src) 97.9 F (36.6 C) (Oral)  Resp 16  Ht 6\' 2"  (1.88 m)  Wt 215 lb (97.523 kg)  BMI 27.59 kg/m2  SpO2 100% Physical Exam 1720: Physical examination:  Nursing notes reviewed; Vital signs and O2 SAT reviewed;  Constitutional: Well developed, Well nourished, Well hydrated, In no acute distress; Head:  Normocephalic, atraumatic; Eyes: EOMI, PERRL, No scleral icterus; ENMT: Mouth and pharynx normal, Mucous membranes moist; Neck: Supple, Full range of motion, No lymphadenopathy; Cardiovascular: Regular rate and rhythm, No murmur, rub, or gallop; Respiratory: Breath sounds clear & equal bilaterally, No rales, rhonchi, wheezes.  Speaking full sentences with ease, Normal respiratory effort/excursion; Chest: +right upper chest wall tender to palp. No rash, no soft tissue crepitus, no deformity. Movement normal; Abdomen: Soft, Nontender, Nondistended, Normal bowel sounds; Genitourinary: No CVA tenderness; Extremities: Pulses normal, No tenderness, No edema, No calf edema or asymmetry.; Neuro: AA&Ox3, Major CN grossly intact.  Speech clear. No gross focal motor or sensory deficits in extremities.; Skin: Color normal, Warm, Dry.   ED Course  Procedures     EKG Interpretation   Date/Time:  Sunday March 24 2014 17:02:37 EDT Ventricular Rate:  72 PR Interval:  190 QRS Duration: 98 QT Interval:  346 QTC Calculation: 378 R Axis:   77 Text Interpretation:  Normal sinus rhythm with sinus arrhythmia RSR' or QR  pattern in V1 suggests right ventricular conduction delay Borderline  ECG  No old tracing to compare Confirmed by [facility]  MD, Anslie Spadafora [phone]) on  03/24/2014 5:27:26 PM      EKG Interpretation  Date/Time:  Sunday March 24 2014 17:29:05 EDT Ventricular Rate:  79 PR Interval:  193 QRS Duration: 93 QT Interval:  377 QTC Calculation: 432 R Axis:   72 Text  Interpretation:  Sinus rhythm RSR' or QR pattern in V1 suggests right ventricular conduction delay No significant change was found Since last tracing of earlier today Confirmed by [facility]  MD, Nunzio Cory [phone]) on 03/24/2014 5:46:06 PM         MDM  MDM Reviewed: previous chart, nursing note and vitals Reviewed previous: labs and ECG Interpretation: labs, ECG and x-ray     Results for orders placed during the hospital encounter of 03/24/14  I-STAT CHEM 8, ED      Result Value Ref Range   Sodium 136 (*) 137 - 147 mEq/L   Potassium 3.7  3.7 - 5.3 mEq/L   Chloride 101  96 - 112 mEq/L   BUN 16  6 - 23 mg/dL   Creatinine, Ser 1.20  0.50 - 1.35 mg/dL   Glucose, Bld 95  70 - 99 mg/dL   Calcium, Ion 1.18  1.12 - 1.23 mmol/L   TCO2 24  0 - 100 mmol/L   Hemoglobin 14.3  13.0 - 17.0 g/dL   HCT 42.0  39.0 - 52.0 %  I-STAT TROPOININ, ED      Result Value Ref Range   Troponin i, poc 0.00  0.00 - 0.08 ng/mL   Comment 3            Dg Chest 2 View 03/24/2014   CLINICAL DATA:  Chest pain and shortness of breath.  EXAM: CHEST  2 VIEW  COMPARISON:  Report dated 06/20/2002.  FINDINGS: Normal sized heart.  Clear lungs.  No pneumothorax.  Mild scoliosis.  IMPRESSION: No acute abnormality.   Electronically Signed   By: Enrique Sack M.D.   On: 03/24/2014 17:47    1910:  Doubt PE as cause for symptoms with low risk [place].  Doubt ACS as cause for symptoms with normal troponin and unchanged EKG from previous after 2 days of constant symptoms. Will tx symptomatically. Dx and testing d/w pt and family.  Questions answered.  Verb understanding, agreeable to d/c home with outpt f/u.    Alfonzo Feller, DO 03/26/14 1225

## 2014-03-24 NOTE — ED Notes (Signed)
Pt c/o right sharp chest pain that started yesterday, worse with breathing and movement,

## 2014-03-24 NOTE — Discharge Instructions (Signed)
°Emergency Department Resource Guide °1) Find a Doctor and Pay Out of Pocket °Although you won't have to find out who is covered by your insurance plan, it is a good idea to ask around and get recommendations. You will then need to call the office and see if the doctor you have chosen will accept you as a new patient and what types of options they offer for patients who are self-pay. Some doctors offer discounts or will set up payment plans for their patients who do not have insurance, but you will need to ask so you aren't surprised when you get to your appointment. ° °2) Contact Your Local Health Department °Not all health departments have doctors that can see patients for sick visits, but many do, so it is worth a call to see if yours does. If you don't know where your local health department is, you can check in your phone book. The CDC also has a tool to help you locate your state's health department, and many state websites also have listings of all of their local health departments. ° °3) Find a Walk-in Clinic °If your illness is not likely to be very severe or complicated, you may want to try a walk in clinic. These are popping up all over the country in pharmacies, drugstores, and shopping centers. They're usually staffed by nurse practitioners or physician assistants that have been trained to treat common illnesses and complaints. They're usually fairly quick and inexpensive. However, if you have serious medical issues or chronic medical problems, these are probably not your best option. ° °No Primary Care Doctor: °- Call [facility] at  832-8000 - they can help you locate a primary care doctor that  accepts your insurance, provides certain services, etc. °- Physician Referral Service- 1-800-533-3463 ° °Chronic Pain Problems: °Organization         Address  Phone   Notes  °[facility]  (336) 297-2271 Patients need to be referred by their primary care doctor.  ° °Medication  Assistance: °Organization         Address  Phone   Notes  °[place] [facility] [address] °[place], [place] [address] (336) 641-8030 --Must be a resident of [place] °-- Must have NO insurance coverage whatsoever (no Medicaid/ Medicare, etc.) °-- The pt. MUST have a primary care doctor that directs their care regularly and follows them in the community °  °[facility]  (866) 331-1348   °[employer]  (888) 892-1162   ° °Agencies that provide inexpensive medical care: °Organization         Address  Phone   Notes  °[facility]  (336) 832-8035   °[facility]    (336) 832-7272   °[facility] [address] °[place], [place] [address] (336) 832-4777   °[facility] [address], °[place] (336) 271-4999   °[facility]    (336) 373-0678   °[facility]    (336) 272-1050   °[facility] ° [address], [place] Phone:  (336) 832-4444, Fax:  (336) 832-4440 Hours of Operation:  9 am - 6 pm, M-F.  Also accepts Medicaid/Medicare and self-pay.  °[facility] ° [address], [place] Phone: (336) 832-3150, Fax: (336) [phone]. Hours of Operation:  8:30 am - 5:30 pm, M-F.  Also accepts Medicaid and self-pay.  °[facility] [place] [address], [place] Phone: (336) 878-6027   °[facility] [address], [place], [place] (336)723-1848, Ext. 123 Mondays & Thursdays: 7-9 AM.  First 15 patients are seen on a first come, first serve basis. °  ° °Medicaid-accepting [place] Providers: ° °Organization         Address  Phone   Notes  °[facility] [address], [place] (336) 641-2100 Also accepts self-pay patients.  °[facility] [address], [place] ° (336) 856-9996   °[facility] [address], [place]  (336) 288-8857   °[facility] [address] [place] [address], [place] (336) 299-7000   °Veita Bland [address], [place]  ° (336) 373-1557 Only accepts [place] Access Medicaid patients after they have their name applied to their card.  ° °Self-Pay (no insurance) in [place]: ° °Organization         Address  Phone   Notes  °Sickle Cell Patients, [facility] [address], [place] (336) 832-1970   °[facility] [address], [place] (336) 832-4400   °[facility] ° [address], [place] (336) 992-4800   °[facility]/Dr. Osei-Bonsu ° [address] [place] [address], [place] or [address], [place] (336) 841-8500 Phone number for both [place] and [place] locations is the same.  °[facility] [address], [place] (336) 299-0000   °[facility] [address] [place] [address], [place] or [address] (336) 852-7530 °(336) 878-2260   °[facility] [address], [place] (336) 350-1642, phone; (336) 294-5005, fax Sees patients 1st and 3rd Saturday of every month.  Must not qualify for public or private insurance (i.e. Medicaid, Medicare, [place] Health Choice, Veterans' Benefits) • Household income should be no more than 200% of the poverty level •The clinic cannot treat you if you are pregnant or think you are pregnant • Sexually transmitted diseases are not treated at the clinic.  ° ° °Dental Care: °Organization         Address  Phone  Notes  °[place] [facility] [address], [place] (336) 641-6152 Accepts children up to age 21 who are enrolled in Medicaid or [place] Health Choice; pregnant women with a Medicaid card; and children who have applied for Medicaid or [place] Health Choice, but were declined, whose parents can pay a reduced fee at time of service.  °[place]  [facility] [place]  [address], [place] (336) 641-7733 Accepts children up to age 21 who are enrolled in Medicaid or  Health Choice; pregnant women with a Medicaid card; and children who have applied for Medicaid or  Health Choice, but were declined, whose parents can pay a reduced fee at time of service.  °[facility] ° [address],  (336) 641-4533 Patients are seen by appointment only. Walk-ins are not accepted. [facility] will see patients 18 years of age and older. °Monday - Tuesday (8am-5pm) °Most Wednesdays (8:30-5pm) °$30 per visit, cash only  °[facility] ° [address], [place] (336) 641-4533 Patients are seen by appointment only. Walk-ins are not accepted. [facility] will see patients 18 years of age and older. °One   Wednesday Evening (Monthly: Volunteer Based).  $30 per visit, cash only  °[facility]  (919) 537-3737 for adults; Children under age 4, call Graduate Pediatric Dentistry at ([phone]) [phone]. Children aged 4-14, please call (919) [phone] to request a pediatric application. ° Dental services are provided in all areas of dental care including fillings, crowns and bridges, complete and partial dentures, implants, gum treatment, root canals, and extractions. Preventive care is also provided. Treatment is provided to both adults and children. °Patients are selected via a lottery and there is often a waiting list. °  °[facility] [address], °[place] ° (336) 763-8833 [email or web address] °  °[facility] [address], [place], [place] (336)723-1848, Ext. 123 Second and Fourth Thursday of each month, opens at 6:30 AM; Clinic ends at 9 AM.  Patients are seen on a first-come first-served basis, and a limited number are seen during each clinic.  ° °[facility] ° [address], [place], [place] (336) 723-7904    Eligibility Requirements °You must have lived in [place], [place], or [place] counties for at least the last three months. °  You cannot be eligible for state or federal sponsored healthcare insurance, including Veterans Administration, Medicaid, or Medicare. °  You generally cannot be eligible for healthcare insurance through your employer.  °  How to apply: °Eligibility screenings are held every Tuesday and Wednesday afternoon from 1:00 pm until 4:00 pm. You do not need an appointment for the interview!  °[facility] [address], [place], [place] 336-631-2330   °[facility] [facility]  336-342-8273   °[facility]  336-703-3100   °[facility]  336-570-6415   ° °Behavioral Health Resources in the Community: °Intensive Outpatient Programs °Organization         Address  Phone  Notes  °[facility] [address], [place], [place] 336-878-6098   °[facility] [address], [place], [place] 336-832-9800   °[facility] [address], [place], [place] ° 336-882-2125   °[facility] [address],  °[place], [place] 1-800-853-5163 or 336-641-4981   °Substance Abuse Resources °Organization         Address  Phone  Notes  °[facility]  336-882-2125   °[facility]  336-784-9470   °[facility]  336-285-9073   °[facility]  336-845-3988   °Residential & Outpatient Substance Abuse Program  1-800-659-3381   °Psychological Services °Organization         Address  Phone  Notes  °[facility]  336- 832-9600   °[facility]  336- 378-7881   °[facility] [address], [place] 1-800-853-5163 or 336-641-4981   ° °Mobile Crisis Teams °Organization         Address  Phone  Notes  °[facility], Mobile Crisis Care Unit  1-877-626-1772   °Assertive °[facility] ° [address]  [place], [place] 336-834-9664   °Sharon DeEsch [address] °[place] 336-554-5454   ° °Self-Help/Support Groups °Organization         Address  Phone             Notes  °[facility]  - variety of support groups  336- 373-1402 Call for more information  °Narcotics Anonymous (NA), [facility] [address], °[place]  2 meetings at this location  ° °  Residential Treatment Programs °Organization         Address  Phone  Notes  °[facility] [address],    °[place]  1-866-801-8205   °[facility] ° [address], [place], [place] 704-293-8524   °[facility] [address], [place] 336-845-3988 Admissions: 8am-3pm M-F  °[facility] [address]    °[place], [place] 336-841-1104   °[facility] [address], [place], [place] 336-379-7146   °[facility] [address]  °[place], [place] 336-285-9073   °[facility] Intensive Outpatient [address], [place], [place] 336-852-3033   °[facility] [address]  °[place], [place] 1-877-615-2722 or 336-784-9470   °[facility] [address] [place], [place] 336-227-7417 Accepts Medicaid  °[facility] [address]  ° [place] 1-800-659-3381 Substance Abuse/Addiction Treatment  ° °[place] Behavioral Health Resources °Organization         Address  Phone  Notes  °[facility]  (888) 581-9988   °Julie Brannon, PhD [address] [place], [place]   (336) 349-5553 or (336) 951-0000   °[facility]   [address] °[place], [place] (336) 349-4454   °[facility] [address], [place], [place] (336) 342-8316 Insurance/Medicaid/sponsorship through [facility]  °[facility] [address]                                    [place], [place] (336) 342-8316 Therapy/tele-psych/case    °[facility] [address]  ° , [place] (336) 349-2233    °Dr. Arfeen  (336) 349-4544   °[facility] [place]  [employer] [place] [facility] 1) [address],  °2) [address], [place] °3)  [address], [place] (336) 349-3220 °(336) 342-7768 ° °(336) 342-8140   °[place] Child Abuse Hotline (336) 342-1394 or (336) 342-3537 (After Hours)    ° ° °Take the prescriptions as directed.  Apply moist heat or ice to the area(s) of discomfort, for 15 minutes at a time, several times per day for the next few days.  Do not fall asleep on a heating or ice pack.  Call your regular medical doctor on Monday to schedule a follow up appointment this week.  Return to the Emergency Department immediately if worsening. ° °

## 2015-05-02 ENCOUNTER — Encounter (HOSPITAL_COMMUNITY): Payer: Self-pay | Admitting: *Deleted

## 2015-05-02 ENCOUNTER — Emergency Department (HOSPITAL_COMMUNITY): Payer: Self-pay

## 2015-05-02 ENCOUNTER — Emergency Department (HOSPITAL_COMMUNITY)
Admission: EM | Admit: 2015-05-02 | Discharge: 2015-05-02 | Disposition: A | Discharge status: Home / Self Care | Payer: Self-pay | Attending: Emergency Medicine | Admitting: Emergency Medicine

## 2015-05-02 DIAGNOSIS — Y999 Unspecified external cause status: Secondary | ICD-10-CM | POA: Insufficient documentation

## 2015-05-02 DIAGNOSIS — Y9302 Activity, running: Secondary | ICD-10-CM | POA: Insufficient documentation

## 2015-05-02 DIAGNOSIS — Z791 Long term (current) use of non-steroidal anti-inflammatories (NSAID): Secondary | ICD-10-CM | POA: Insufficient documentation

## 2015-05-02 DIAGNOSIS — Z79899 Other long term (current) drug therapy: Secondary | ICD-10-CM | POA: Insufficient documentation

## 2015-05-02 DIAGNOSIS — W01198A Fall on same level from slipping, tripping and stumbling with subsequent striking against other object, initial encounter: Secondary | ICD-10-CM | POA: Insufficient documentation

## 2015-05-02 DIAGNOSIS — R0789 Other chest pain: Secondary | ICD-10-CM

## 2015-05-02 DIAGNOSIS — R011 Cardiac murmur, unspecified: Secondary | ICD-10-CM | POA: Insufficient documentation

## 2015-05-02 DIAGNOSIS — W010XXA Fall on same level from slipping, tripping and stumbling without subsequent striking against object, initial encounter: Secondary | ICD-10-CM

## 2015-05-02 DIAGNOSIS — Z87891 Personal history of nicotine dependence: Secondary | ICD-10-CM | POA: Insufficient documentation

## 2015-05-02 DIAGNOSIS — S299XXA Unspecified injury of thorax, initial encounter: Secondary | ICD-10-CM | POA: Insufficient documentation

## 2015-05-02 DIAGNOSIS — Y929 Unspecified place or not applicable: Secondary | ICD-10-CM | POA: Insufficient documentation

## 2015-05-02 MED ORDER — CYCLOBENZAPRINE HCL 10 MG PO TABS: 10.0000 mg | ORAL_TABLET | Freq: Two times a day (BID) | ORAL | Status: DC | PRN

## 2015-05-02 MED ORDER — DIAZEPAM 5 MG PO TABS
10.0000 mg | ORAL_TABLET | Freq: Once | ORAL | Status: AC
  Administered 2015-05-02: 10 mg via ORAL
  Filled 2015-05-02: qty 2

## 2015-05-02 MED ORDER — IBUPROFEN 800 MG PO TABS: 800.0000 mg | ORAL_TABLET | Freq: Three times a day (TID) | ORAL | Status: DC

## 2015-05-02 MED ORDER — IBUPROFEN 400 MG PO TABS
800.0000 mg | ORAL_TABLET | Freq: Once | ORAL | Status: AC
  Administered 2015-05-02: 800 mg via ORAL
  Filled 2015-05-02: qty 2

## 2015-05-02 NOTE — ED Notes (Signed)
MD at bedside. 

## 2015-05-02 NOTE — Discharge Instructions (Signed)
1. Medications: Ibuprofen, Flexeril, usual home medications 2. Treatment: rest, drink plenty of fluids, gentle stretching, use incentive spirometer as directed 3. Follow Up: Please followup with your primary doctor in 3-5 days for discussion of your diagnoses and further evaluation after today's visit; if you do not have a primary care doctor use the resource guide provided to find one; Please return to the ER for multi-breathing, high fevers or other concerns    Chest Wall Pain Chest wall pain is pain in or around the bones and muscles of your chest. It may take up to 6 weeks to get better. It may take longer if you must stay physically active in your work and activities.  CAUSES  Chest wall pain may happen on its own. However, it may be caused by:  A viral illness like the flu.  Injury.  Coughing.  Exercise.  Arthritis.  Fibromyalgia.  Shingles. HOME CARE INSTRUCTIONS   Avoid overtiring physical activity. Try not to strain or perform activities that cause pain. This includes any activities using your chest or your abdominal and side muscles, especially if heavy weights are used.  Put ice on the sore area.  Put ice in a plastic bag.  Place a towel between your skin and the bag.  Leave the ice on for 15-20 minutes per hour while awake for the first 2 days.  Only take over-the-counter or prescription medicines for pain, discomfort, or fever as directed by your caregiver. SEEK IMMEDIATE MEDICAL CARE IF:   Your pain increases, or you are very uncomfortable.  You have a fever.  Your chest pain becomes worse.  You have new, unexplained symptoms.  You have nausea or vomiting.  You feel sweaty or lightheaded.  You have a cough with phlegm (sputum), or you cough up blood. MAKE SURE YOU:   Understand these instructions.  Will watch your condition.  Will get help right away if you are not doing well or get worse. Document Released: 09/06/2005 Document Revised:  11/29/2011 Document Reviewed: 05/03/2011 [facility] Patient Information 2015 [place], [place]. This information is not intended to replace advice given to you by your health care provider. Make sure you discuss any questions you have with your health care provider.    Emergency Department Resource Guide 1) Find a Doctor and Pay Out of Pocket Although you won't have to find out who is covered by your insurance plan, it is a good idea to ask around and get recommendations. You will then need to call the office and see if the doctor you have chosen will accept you as a new patient and what types of options they offer for patients who are self-pay. Some doctors offer discounts or will set up payment plans for their patients who do not have insurance, but you will need to ask so you aren't surprised when you get to your appointment.  2) Contact Your Local Health Department Not all health departments have doctors that can see patients for sick visits, but many do, so it is worth a call to see if yours does. If you don't know where your local health department is, you can check in your phone book. The CDC also has a tool to help you locate your state's health department, and many state websites also have listings of all of their local health departments.  3) Find a [facility] If your illness is not likely to be very severe or complicated, you may want to try a walk in clinic. These are popping  up all over the country in pharmacies, drugstores, and shopping centers. They're usually staffed by nurse practitioners or physician assistants that have been trained to treat common illnesses and complaints. They're usually fairly quick and inexpensive. However, if you have serious medical issues or chronic medical problems, these are probably not your best option.  No Primary Care Doctor: - Call Health Connect at  [phone] - they can help you locate a primary care doctor that  accepts your insurance, provides  certain services, etc. - Physician Referral Service- [phone]  Chronic Pain Problems: Organization         Address  Phone   Notes  [facility]  [phone] Patients need to be referred by their primary care doctor.   Medication Assistance: Organization         Address  Phone   Notes  [facility] Medication [facility] [place], [place] [address] [phone] --Must be a resident of [facility] -- Must have NO insurance coverage whatsoever (no Medicaid/ Medicare, etc.) -- The pt. MUST have a primary care doctor that directs their care regularly and follows them in the community   MedAssist  [phone]   [employer]  [phone]    Agencies that provide inexpensive medical care: Organization         Address  Phone   Notes  [place]  [phone]   [facility]    [phone]   [facility] [place], [place] [address] [phone]   [place] [address] [place] [phone]   Planned Parenthood    [phone]   [facility]    [phone]   [place] and [address], [place] Phone:  [phone], Fax:  [phone] Hours of Operation:  9 am - 6 pm, M-F.  Also accepts Medicaid/Medicare and self-pay.  [facility] for [place], [address], [place] Phone: [phone], Fax: [phone]. Hours of Operation:  8:30 am - 5:30 pm, M-F.  Also accepts Medicaid and self-pay.  [facility] [place] [address] [place] Phone: [phone]   [place], [place], [place] [phone], Ext. [phone] Mondays & Thursdays: 7-9 AM.  First 15 patients are seen on a first come, first serve basis.    [place] Providers:  Organization         Address  Phone   Notes  [facility] [address], [place] [phone] Also accepts self-pay patients.  [facility] [address], [place]  [phone]   [place], [address], [place] [phone]   [facility] [address] [place] [phone]   Lucianne Lei [address], [place]   [phone] Only accepts [place] Access [place] patients after they have their name applied to their card.   Self-Pay (no insurance) in [facility]:  Organization         Address  Phone   Notes  Sickle Cell Patients, [facility] [place] [phone]   [facility] [place] [phone]   [facility]gent [place]  [phone]  [address], [place] [phone]   [facility]/Dr. Osei-Bonsu  [address] [place] or [address], [place] [phone] Phone number for both [place] and [place] locations is the same.  Urgent Medical and [facility] [address] [place] [phone]   [facility] [address], [place] or [address] [phone]   [facility] [address], [place] [phone], phone; [phone], fax Sees patients 1st and 3rd Saturday of every month.  Must not qualify for public or private insurance (i.e. Medicaid, Medicare, [place] Health Choice, Veterans' Benefits)  Household income should be no more than 200% of the poverty level The clinic cannot treat you if you are pregnant or think you are pregnant  Sexually transmitted diseases are not treated at the clinic.    Dental Care: Organization         Address  Phone  Notes  [facility] Department of [facility] [place] [phone]  Accepts children up to age 29 who are enrolled in [place] or [place]; pregnant women with a Medicaid card; and children who have applied for Medicaid or [place] Health Choice, but were declined, whose parents can pay a reduced fee at time of service.  [facility] Department of [facility]  [address], [place] [phone] Accepts children up to age 79 who are enrolled in [place] or [place]; pregnant women with a Medicaid card; and children who have applied for Medicaid or [place] Health Choice, but were declined, whose parents can pay a reduced fee at time of service.  [facility] Adult [facility] Access PROGRAM  [place] [phone] Patients are seen by appointment only. Walk-ins are not accepted. [place] will see patients 67 years of age and older. Monday - Tuesday (8am-5pm) Most Wednesdays (8:30-5pm) $30 per visit, cash only  [facility] Adult Dental Access PROGRAM  [address], [facility] [phone] Patients are seen by appointment only. Walk-ins are not accepted. [place] will see patients 67 years of age and older. One Wednesday Evening (Monthly: Volunteer Based).  $30 per visit, cash only  [place]  [phone] for adults; Children under age 38, call Graduate Pediatric Dentistry at [phone]. Children aged 65-14, please call [phone] to request a pediatric application.  Dental services are provided in all areas of dental care including fillings, crowns and bridges, complete and partial dentures, implants, gum treatment, root canals, and extractions. Preventive care is also provided. Treatment is provided to both adults and children. Patients are selected via a lottery and there is often a waiting list.   [facility] [address], [place]  [phone] [email or web address]   [facility] [address] [place], [place] [phone], Ext. [phone] Second  and Fourth Thursday of each month, opens at 6:30 AM; Clinic ends at 9 AM.  Patients are seen on a first-come first-served basis, and a limited number are seen during each clinic.   [facility]  [address] [place] Danker [place], [place] [phone]   Eligibility Requirements You must have lived in [place], [place], or [place] counties for at least the last three months.   You cannot be eligible for state or federal sponsored [employer], including [employer], [place], or  Medicare.   You generally cannot be eligible for healthcare insurance through your employer.    How to apply: Eligibility screenings are held every Tuesday and Wednesday afternoon from 1:00 pm until 4:00 pm. You do not need an appointment for the interview!  [facility] [address] [place], [place]   [place]  [place] Department  [place]  [phone]    Behavioral Health Resources in the Community: Intensive Outpatient Programs Organization         Address  Phone  Notes  [place]. [address], [place], [place] [phone]   [facility] [address] [place], [place]   ADS: Alcohol & Drug Svcs [address], [place], [place]   [place] [address],  [place], [place] or [phone]   Substance Abuse Resources Organization         Address  Phone  Notes  Alcohol and Drug Services  [phone]   [place]  [phone]   [place]   Chinita Pester  [phone]   Residential & Outpatient Substance Abuse Program  [phone]   Psychological Services Organization         Address  Phone  Notes  [facility] [place]  [place]  [phone]   [place] [address] [place] or [phone]    Mobile Crisis Teams Organization         Address  Phone  Notes  [facility], Mobile Crisis Care Unit  [phone]   [facility] Psych[facility]  [address] [place], [place]   [place] [address], [place] [phone]    Self-Help/Support Groups Organization         Address  Phone             Notes  [place]. of [place] - variety of support groups  [place] Call for more information  Narcotics Anonymous (NA), [facility] [address], [employer] [place]  2 meetings at this location   Special educational needs teacher         Address  Phone  Notes  [facility] Residential Treatment [place],    [place]  [phone]   [facility]  [address] [place] [address], [place], [place]   [place], [place] [phone] Admissions: 8am-3pm M-F  Incentives Substance [place] [address],    [place], [place] [phone]   [facility] [address] [place], [place], [place]   [facility] [address]  [place], [place]   [facility] - Intensive Outpatient [address], [place], [place]   [facility] ([place] [address].,  [place], [place] [phone] or [phone]   [facility] Services (RTS) [address] [place], [place] Accepts Medicaid  [place] [address],  [place] [phone] Substance Abuse/Addiction Treatment   [facility] Organization         Address  Phone  Notes  [facility]  [phone]   Domenic Schwab, PhD [address] [place], [place]   [phone] or [phone]   [place]  [place], [place]  [phone]   [facility] [address] [place], [place] [phone] Insurance/Medicaid/sponsorship through [facility] and Families [address] [place], [place] [phone] [place], [place] [phone]    Dr. Adele Schilder  [phone]   [facility] Dept. 1) [address], [place] 2) [place] 3)  [place] [address], [place] [phone]  [phone]   [place] [phone] or [phone] (After Hours)

## 2015-05-02 NOTE — ED Notes (Signed)
Patient states he was running this morning and fell onto his right side.  He has pain under the rib.  Patient has more pain with movement and breathing.  Patient states he did not take any pain meds.

## 2015-05-02 NOTE — ED Provider Notes (Signed)
CSN: [id number]     Arrival date & time 05/02/15  1704 History  This chart was scribed for Abigail Butts, PA-C, working with Fredia Sorrow, MD by Starleen Arms, ED Scribe. This patient was seen in room TR09C/TR09C and the patient's care was started at 6:08 PM.   Chief Complaint  Patient presents with  . Fall  . Chest Pain   Patient is a 29 y.o. male presenting with chest pain. The history is provided by the patient and medical records. No language interpreter was used.  Chest Pain Associated symptoms: no abdominal pain, no back pain, no cough, no diaphoresis, no fatigue, no fever, no headache, no nausea, no shortness of breath and not vomiting    HPI Comments: Jay Montgomery is a 29 y.o. male who presents to the Emergency Department complaining of right-sided CP onset this morning.  The patient reports he was running in wet grass, slipped, and hit his right-sided chest on a hard object (suspected to be a rock).  The pain worsened throughout the day and is worsened with inspiration.  No medications have been tried.  HE did not hit his head or have a LOC.  NO difficulty breathing but pain with deep inspiration.    Past Medical History  Diagnosis Date  . Heart murmur    Past Surgical History  Procedure Laterality Date  . Tonsillectomy    . Adenoidectomy     No family history on file. Social History  Substance Use Topics  . Smoking status: Former Research scientist (life sciences)  . Smokeless tobacco: None  . Alcohol Use: No    Review of Systems  Constitutional: Negative for fever, diaphoresis, appetite change, fatigue and unexpected weight change.  HENT: Negative for mouth sores.   Eyes: Negative for visual disturbance.  Respiratory: Negative for cough, chest tightness, shortness of breath and wheezing.   Cardiovascular: Positive for chest pain.  Gastrointestinal: Negative for nausea, vomiting, abdominal pain, diarrhea and constipation.  Endocrine: Negative for polydipsia, polyphagia and polyuria.   Genitourinary: Negative for dysuria, urgency, frequency and hematuria.  Musculoskeletal: Negative for back pain and neck stiffness.  Skin: Negative for rash.  Allergic/Immunologic: Negative for immunocompromised state.  Neurological: Negative for syncope, light-headedness and headaches.  Hematological: Does not bruise/bleed easily.  Psychiatric/Behavioral: Negative for sleep disturbance. The patient is not nervous/anxious.       Allergies  Review of patient's allergies indicates no known allergies.  Home Medications   Prior to Admission medications   Medication Sig Start Date End Date Taking? Authorizing Provider  cyclobenzaprine (FLEXERIL) 10 MG tablet Take 1 tablet (10 mg total) by mouth 2 (two) times daily as needed for muscle spasms. 05/02/15   Rhys Anchondo, PA-C  HYDROcodone-acetaminophen (NORCO/VICODIN) 5-325 MG per tablet 1 or 2 tabs PO q6 hours prn pain 03/24/14   Francine Graven, DO  ibuprofen (ADVIL,MOTRIN) 800 MG tablet Take 1 tablet (800 mg total) by mouth 3 (three) times daily. 05/02/15   Demaya Hardge, PA-C  methocarbamol (ROBAXIN) 500 MG tablet Take 2 tablets (1,000 mg total) by mouth 4 (four) times daily as needed for muscle spasms (muscle spasm/pain). 03/24/14   Francine Graven, DO  naproxen (NAPROSYN) 250 MG tablet Take 1 tablet (250 mg total) by mouth 2 (two) times daily with a meal. 03/24/14   Francine Graven, DO   BP 136/85 mmHg  Pulse 73  Temp(Src) 98.2 F (36.8 C) (Oral)  Resp 20  Ht 6\' 2"  (1.88 m)  Wt 215 lb (97.523 kg)  BMI 27.59 kg/m2  SpO2 100% Physical Exam  Constitutional: He is oriented to person, place, and time. He appears well-developed and well-nourished. No distress.  Awake, alert, nontoxic appearance  HENT:  Head: Normocephalic and atraumatic.  Nose: Nose normal.  Mouth/Throat: Uvula is midline, oropharynx is clear and moist and mucous membranes are normal. No oropharyngeal exudate.  Eyes: Conjunctivae and EOM are normal. Pupils  are equal, round, and reactive to light. No scleral icterus.  Neck: Normal range of motion. Neck supple. No spinous process tenderness and no muscular tenderness present. No rigidity. Normal range of motion present.  Full ROM without pain No midline cervical tenderness No crepitus, deformity or step-offs No paraspinal tenderness  Cardiovascular: Normal rate, regular rhythm, normal heart sounds and intact distal pulses.   No murmur heard. Pulses:      Radial pulses are 2+ on the right side, and 2+ on the left side.       Dorsalis pedis pulses are 2+ on the right side, and 2+ on the left side.       Posterior tibial pulses are 2+ on the right side, and 2+ on the left side.  Pulmonary/Chest: Effort normal and breath sounds normal. No accessory muscle usage. No respiratory distress. He has no decreased breath sounds. He has no wheezes. He has no rhonchi. He has no rales. He exhibits no tenderness and no bony tenderness.  TTP over the anterior 8th, 9th, and 10th ribs.  No palpable deformity.  No ecchymosis.  No crepitus.  No flail segments.  Equal chest rise.  Clear and equal breath sounds.   Abdominal: Soft. Normal appearance and bowel sounds are normal. He exhibits no mass. There is no tenderness. There is no rigidity, no rebound, no guarding and no CVA tenderness.  No contusions Abd soft and nontender  Musculoskeletal: Normal range of motion. He exhibits no edema.       Thoracic back: He exhibits normal range of motion.       Lumbar back: He exhibits normal range of motion.  Full range of motion of the T-spine and L-spine No tenderness to palpation of the spinous processes of the T-spine or L-spine No crepitus, deformity or step-offs no tenderness to palpation of the paraspinous muscles of the L-spine  Lymphadenopathy:    He has no cervical adenopathy.  Neurological: He is alert and oriented to person, place, and time. He has normal reflexes. No cranial nerve deficit. GCS eye subscore is 4.  GCS verbal subscore is 5. GCS motor subscore is 6.  Reflex Scores:      Bicep reflexes are 2+ on the right side and 2+ on the left side.      Brachioradialis reflexes are 2+ on the right side and 2+ on the left side.      Patellar reflexes are 2+ on the right side and 2+ on the left side.      Achilles reflexes are 2+ on the right side and 2+ on the left side. Speech is clear and goal oriented Moves extremities without ataxia 5/5 in the bilateral upper and lower extremities  Skin: Skin is warm and dry. No rash noted. He is not diaphoretic. No erythema.  Psychiatric: He has a normal mood and affect.  Nursing note and vitals reviewed.   ED Course  Procedures (including critical care time)  DIAGNOSTIC STUDIES: Oxygen Saturation is 99% on RA, normal by my interpretation.    COORDINATION OF CARE:  6:12 PM Discussed treatment plan with patient at bedside.  Patient  acknowledges and agrees with plan.    Labs Review Labs Reviewed - No data to display  Imaging Review Dg Ribs Unilateral W/chest Right  05/02/2015   CLINICAL DATA:  Recent fall with right-sided chest pain, initial encounter  EXAM: RIGHT RIBS AND CHEST - 3+ VIEW  COMPARISON:  03/24/2014  FINDINGS: Cardiac shadow is within normal limits. The lungs are well aerated bilaterally. No pneumothorax is identified. No acute rib fracture is noted.  IMPRESSION: No acute abnormality noted.   Electronically Signed   By: Inez Catalina M.D.   On: 05/02/2015 17:59   I personally reviewed and evaluated these images and lab results as part of my medical decision-making.   EKG Interpretation None      MDM   Final diagnoses:  Right-sided chest wall pain  Fall from slip, trip, or stumble, initial encounter   Jay Montgomery presents with chest wall pain after fall this morning. No evidence of fracture on x-ray. Clear and equal breath sounds. No evidence of pneumothorax.  Patient given anti-inflammatory disease and muscle relaxer here in  the emergency department and will be discharged home with same.  Patient also discharged home with incentive spirometry.  BP 136/85 mmHg  Pulse 73  Temp(Src) 98.2 F (36.8 C) (Oral)  Resp 20  Ht 6\' 2"  (1.88 m)  Wt 215 lb (97.523 kg)  BMI 27.59 kg/m2  SpO2 100%  I personally performed the services described in this documentation, which was scribed in my presence. The recorded information has been reviewed and is accurate.   Jarrett Soho Danicka Hourihan, PA-C 05/02/15 1843  Fredia Sorrow, MD 05/07/15 [phone]

## 2016-03-29 ENCOUNTER — Emergency Department (HOSPITAL_COMMUNITY): Payer: Managed Care, Other (non HMO)

## 2016-03-29 ENCOUNTER — Emergency Department (HOSPITAL_COMMUNITY)
Admission: EM | Admit: 2016-03-29 | Discharge: 2016-03-29 | Disposition: A | Discharge status: Home / Self Care | Payer: Managed Care, Other (non HMO) | Attending: Emergency Medicine | Admitting: Emergency Medicine

## 2016-03-29 ENCOUNTER — Encounter (HOSPITAL_COMMUNITY): Payer: Self-pay | Admitting: *Deleted

## 2016-03-29 DIAGNOSIS — Y9301 Activity, walking, marching and hiking: Secondary | ICD-10-CM | POA: Diagnosis not present

## 2016-03-29 DIAGNOSIS — F17200 Nicotine dependence, unspecified, uncomplicated: Secondary | ICD-10-CM | POA: Diagnosis not present

## 2016-03-29 DIAGNOSIS — S93401A Sprain of unspecified ligament of right ankle, initial encounter: Secondary | ICD-10-CM | POA: Diagnosis not present

## 2016-03-29 DIAGNOSIS — Y929 Unspecified place or not applicable: Secondary | ICD-10-CM | POA: Insufficient documentation

## 2016-03-29 DIAGNOSIS — X501XXA Overexertion from prolonged static or awkward postures, initial encounter: Secondary | ICD-10-CM | POA: Diagnosis not present

## 2016-03-29 DIAGNOSIS — S99911A Unspecified injury of right ankle, initial encounter: Secondary | ICD-10-CM | POA: Diagnosis present

## 2016-03-29 DIAGNOSIS — Y999 Unspecified external cause status: Secondary | ICD-10-CM | POA: Diagnosis not present

## 2016-03-29 MED ORDER — OXYCODONE-ACETAMINOPHEN 5-325 MG PO TABS
1.0000 | ORAL_TABLET | ORAL | Status: AC | PRN
  Administered 2016-03-29 (×2): 1 via ORAL
  Filled 2016-03-29 (×2): qty 1

## 2016-03-29 NOTE — ED Provider Notes (Signed)
CSN: [id number]     Arrival date & time 03/29/16  0518 History   First MD Initiated Contact with Patient 03/29/16 [phone]     Chief Complaint  Patient presents with  . Ankle Pain   HPI   30 YOM presents with Right ankle pain. Patient reports that he was walking down the steps this morning when he rolled his right ankle with that inversion injury. Patient reports pain at that time, pain with ambulation. Swelling noted to the right lateral aspect. Patient denies any pain to the proximal foot nor remainder of the lower extremity. Denies any loss of sensation, decreased range of motion due to pain.   Past Medical History  Diagnosis Date  . Heart murmur    Past Surgical History  Procedure Laterality Date  . Tonsillectomy    . Adenoidectomy     No family history on file. Social History  Substance Use Topics  . Smoking status: Current Some Day Smoker  . Smokeless tobacco: None  . Alcohol Use: No    Review of Systems  All other systems reviewed and are negative.   Allergies  Review of patient's allergies indicates no known allergies.  Home Medications   Prior to Admission medications   Medication Sig Start Date End Date Taking? Authorizing Provider  cyclobenzaprine (FLEXERIL) 10 MG tablet Take 1 tablet (10 mg total) by mouth 2 (two) times daily as needed for muscle spasms. 05/02/15   Hannah Muthersbaugh, PA-C  HYDROcodone-acetaminophen (NORCO/VICODIN) 5-325 MG per tablet 1 or 2 tabs PO q6 hours prn pain 03/24/14   Francine Graven, DO  ibuprofen (ADVIL,MOTRIN) 800 MG tablet Take 1 tablet (800 mg total) by mouth 3 (three) times daily. 05/02/15   Hannah Muthersbaugh, PA-C  methocarbamol (ROBAXIN) 500 MG tablet Take 2 tablets (1,000 mg total) by mouth 4 (four) times daily as needed for muscle spasms (muscle spasm/pain). 03/24/14   Francine Graven, DO  naproxen (NAPROSYN) 250 MG tablet Take 1 tablet (250 mg total) by mouth 2 (two) times daily with a meal. 03/24/14   Francine Graven, DO   BP  134/100 mmHg  Pulse 107  Temp(Src) 98.3 F (36.8 C) (Oral)  Resp 22  Ht 6\' 2"  (1.88 m)  Wt 97.523 kg  BMI 27.59 kg/m2  SpO2 98%   Physical Exam  Constitutional: He is oriented to person, place, and time. He appears well-developed and well-nourished.  HENT:  Head: Normocephalic and atraumatic.  Eyes: Conjunctivae are normal. Pupils are equal, round, and reactive to light. Right eye exhibits no discharge. Left eye exhibits no discharge. No scleral icterus.  Neck: Normal range of motion. No JVD present. No tracheal deviation present.  Pulmonary/Chest: Effort normal. No stridor.  Musculoskeletal:  Obvious swelling to the right lateral ankle, tenderness to palpation of the lateral malleolus, medial ankle nontender, no signs of trauma. Difficult exam due to patient's pain, no obvious laxity. No significant pain with compression of the distal tib-fib, no pain to palpation of the proximal fibula. Pedal pulses 2+, sensation intact  Neurological: He is alert and oriented to person, place, and time. Coordination normal.  Psychiatric: He has a normal mood and affect. His behavior is normal. Judgment and thought content normal.  Nursing note and vitals reviewed.   ED Course  Procedures (including critical care time) Labs Review Labs Reviewed - No data to display  Imaging Review Dg Ankle Complete Right  03/29/2016  CLINICAL DATA:  Lateral ankle pain after falling down stairs. EXAM: RIGHT ANKLE - COMPLETE 3+  VIEW COMPARISON:  None. FINDINGS: No fracture deformity nor dislocation. The ankle mortise appears congruent and the tibiofibular syndesmosis intact. No destructive bony lesions. Mild lateral ankle soft tissue swelling without subcutaneous gas or radiopaque foreign bodies. IMPRESSION: Mild lateral ankle soft tissue swelling without acute fracture deformity or dislocation. Electronically Signed   By: Elon Alas M.D.   On: 03/29/2016 05:50   I have personally reviewed and evaluated these  images and lab results as part of my medical decision-making.   EKG Interpretation None      MDM   Final diagnoses:  Ankle sprain, right, initial encounter    Labs:  Imaging: DG ankle complete right- soft tissue swelling no acute fracture  Consults:  Therapeutics:  Discharge Meds:   Assessment/Plan: 30 year old male presents today with ankle sprain. No gross laxity on exam, patient be placed in an ASO, given crutches, weightbearing as tolerated, gradual progression to weightbearing. Patient given orthopedics follow-up if symptoms persist, return precautions given. Patient verbalized understanding and agreement to today's plan.        Okey Regal, PA-C [id number]  Delora Fuel, MD [id number]

## 2016-03-29 NOTE — ED Notes (Signed)
Pt states that he was coming down the stairs for work and rolled his rt ankle; pt c/o pain and swelling to rt ankle; + pulse; + sensation

## 2016-03-29 NOTE — Discharge Instructions (Signed)
Ankle Sprain  An ankle sprain is an injury to the strong, fibrous tissues (ligaments) that hold the bones of your ankle joint together.   CAUSES  An ankle sprain is usually caused by a fall or by twisting your ankle. Ankle sprains most commonly occur when you step on the outer edge of your foot, and your ankle turns inward. People who participate in sports are more prone to these types of injuries.   SYMPTOMS    Pain in your ankle. The pain may be present at rest or only when you are trying to stand or walk.   Swelling.   Bruising. Bruising may develop immediately or within 1 to 2 days after your injury.   Difficulty standing or walking, particularly when turning corners or changing directions.  DIAGNOSIS   Your caregiver will ask you details about your injury and perform a physical exam of your ankle to determine if you have an ankle sprain. During the physical exam, your caregiver will press on and apply pressure to specific areas of your foot and ankle. Your caregiver will try to move your ankle in certain ways. An X-ray exam may be done to be sure a bone was not broken or a ligament did not separate from one of the bones in your ankle (avulsion fracture).   TREATMENT   Certain types of braces can help stabilize your ankle. Your caregiver can make a recommendation for this. Your caregiver may recommend the use of medicine for pain. If your sprain is severe, your caregiver may refer you to a surgeon who helps to restore function to parts of your skeletal system (orthopedist) or a physical therapist.  HOME CARE INSTRUCTIONS    Apply ice to your injury for 1-2 days or as directed by your caregiver. Applying ice helps to reduce inflammation and pain.    Put ice in a plastic bag.    Place a towel between your skin and the bag.    Leave the ice on for 15-20 minutes at a time, every 2 hours while you are awake.   Only take over-the-counter or prescription medicines for pain, discomfort, or fever as directed by  your caregiver.   Elevate your injured ankle above the level of your heart as much as possible for 2-3 days.   If your caregiver recommends crutches, use them as instructed. Gradually put weight on the affected ankle. Continue to use crutches or a cane until you can walk without feeling pain in your ankle.   If you have a plaster splint, wear the splint as directed by your caregiver. Do not rest it on anything harder than a pillow for the first 24 hours. Do not put weight on it. Do not get it wet. You may take it off to take a shower or bath.   You may have been given an elastic bandage to wear around your ankle to provide support. If the elastic bandage is too tight (you have numbness or tingling in your foot or your foot becomes cold and blue), adjust the bandage to make it comfortable.   If you have an air splint, you may blow more air into it or let air out to make it more comfortable. You may take your splint off at night and before taking a shower or bath. Wiggle your toes in the splint several times per day to decrease swelling.  SEEK MEDICAL CARE IF:    You have rapidly increasing bruising or swelling.   Your toes feel   extremely cold or you lose feeling in your foot.   Your pain is not relieved with medicine.  SEEK IMMEDIATE MEDICAL CARE IF:   Your toes are numb or blue.   You have severe pain that is increasing.  MAKE SURE YOU:    Understand these instructions.   Will watch your condition.   Will get help right away if you are not doing well or get worse.     This information is not intended to replace advice given to you by your health care provider. Make sure you discuss any questions you have with your health care provider.     Document Released: 09/06/2005 Document Revised: 09/27/2014 Document Reviewed: 09/18/2011  Elsevier Interactive Patient Education 2016 [employer]

## 2016-04-01 ENCOUNTER — Encounter (HOSPITAL_COMMUNITY): Payer: Self-pay | Admitting: Emergency Medicine

## 2016-04-01 ENCOUNTER — Emergency Department (HOSPITAL_COMMUNITY): Payer: Managed Care, Other (non HMO)

## 2016-04-01 ENCOUNTER — Emergency Department (HOSPITAL_COMMUNITY)
Admission: EM | Admit: 2016-04-01 | Discharge: 2016-04-01 | Disposition: A | Discharge status: Home / Self Care | Payer: Managed Care, Other (non HMO) | Attending: Emergency Medicine | Admitting: Emergency Medicine

## 2016-04-01 DIAGNOSIS — Z5321 Procedure and treatment not carried out due to patient leaving prior to being seen by health care provider: Secondary | ICD-10-CM | POA: Diagnosis not present

## 2016-04-01 DIAGNOSIS — M25571 Pain in right ankle and joints of right foot: Secondary | ICD-10-CM | POA: Diagnosis present

## 2016-04-01 NOTE — ED Notes (Signed)
Pt states he was seen for R ankle sprain 2 days ago, pain has not increased and is now radiating around back of ankle with increased swelling to foot and ankle. Pt also states he has periods of numbness in toes.

## 2016-04-01 NOTE — ED Notes (Signed)
Pt very upset regarding wait, attempts made to explain to patient he was left in triage room for his comfort so he would not have to elevate foot in waiting room. Pt continues to not listen to this RN, pt had repeat xray, pt given cryotherapy. Pt does wear ASO provided 2 days ago, stating it places too much pressure on ankle.  Explained to patient more rooms would be opening very soon so he would be in a room very soon, pt states he will be leaving d/t family obligations, requested patient alert staff if he decided to leave, pt states "I ain't sayin' nothin'".  Family at bedside.

## 2016-04-01 NOTE — ED Notes (Signed)
Pt can be heard cursing in the hallway, this nurse ask patient to refrain from [place]. Pt upset regarding wait, apologies given for delay in getting ice pack to patient. Again attempts made to explain wait times. Pt seen ambulating quickly from department, pt can be heard cursing in waiting area.

## 2016-09-09 ENCOUNTER — Emergency Department (HOSPITAL_COMMUNITY): Payer: Managed Care, Other (non HMO)

## 2016-09-09 ENCOUNTER — Emergency Department (HOSPITAL_COMMUNITY)
Admission: EM | Admit: 2016-09-09 | Discharge: 2016-09-09 | Disposition: A | Discharge status: Home / Self Care | Payer: Managed Care, Other (non HMO) | Attending: Emergency Medicine | Admitting: Emergency Medicine

## 2016-09-09 ENCOUNTER — Encounter (HOSPITAL_COMMUNITY): Payer: Self-pay

## 2016-09-09 DIAGNOSIS — R091 Pleurisy: Secondary | ICD-10-CM | POA: Diagnosis not present

## 2016-09-09 DIAGNOSIS — J069 Acute upper respiratory infection, unspecified: Secondary | ICD-10-CM | POA: Insufficient documentation

## 2016-09-09 DIAGNOSIS — F17200 Nicotine dependence, unspecified, uncomplicated: Secondary | ICD-10-CM | POA: Insufficient documentation

## 2016-09-09 DIAGNOSIS — R079 Chest pain, unspecified: Secondary | ICD-10-CM | POA: Diagnosis present

## 2016-09-09 LAB — CBC
HCT: 46.3 % (ref 39.0–52.0)
HEMOGLOBIN: 16.1 g/dL (ref 13.0–17.0)
MCH: 30 pg (ref 26.0–34.0)
MCHC: 34.8 g/dL (ref 30.0–36.0)
MCV: 86.2 fL (ref 78.0–100.0)
Platelets: 198 10*3/uL (ref 150–400)
RBC: 5.37 MIL/uL (ref 4.22–5.81)
RDW: 13.2 % (ref 11.5–15.5)
WBC: 8.1 10*3/uL (ref 4.0–10.5)

## 2016-09-09 LAB — BASIC METABOLIC PANEL
ANION GAP: 8 (ref 5–15)
BUN: 9 mg/dL (ref 6–20)
CHLORIDE: 105 mmol/L (ref 101–111)
CO2: 24 mmol/L (ref 22–32)
Calcium: 9.5 mg/dL (ref 8.9–10.3)
Creatinine, Ser: 1.14 mg/dL (ref 0.61–1.24)
GFR calc non Af Amer: >60 mL/min (ref >60)
Glucose, Bld: 100 mg/dL — ABNORMAL HIGH (ref 65–99)
POTASSIUM: 4.4 mmol/L (ref 3.5–5.1)
Sodium: 137 mmol/L (ref 135–145)

## 2016-09-09 LAB — D-DIMER, QUANTITATIVE (NOT AT ARMC)

## 2016-09-09 LAB — I-STAT TROPONIN, ED: TROPONIN I, POC: 0 ng/mL (ref 0.00–0.08)

## 2016-09-09 MED ORDER — HYDROCODONE-HOMATROPINE 5-1.5 MG/5ML PO SYRP: 5.0000 mL | ORAL_SOLUTION | Freq: Four times a day (QID) | ORAL | 0 refills | Status: DC | PRN

## 2016-09-09 MED ORDER — IBUPROFEN 800 MG PO TABS
800.0000 mg | ORAL_TABLET | Freq: Three times a day (TID) | ORAL | 0 refills | Status: DC
Start: 2016-09-09 — End: 2016-12-29

## 2016-09-09 MED ORDER — ALBUTEROL SULFATE HFA 108 (90 BASE) MCG/ACT IN AERS
2.0000 | INHALATION_SPRAY | RESPIRATORY_TRACT | Status: DC | PRN
  Administered 2016-09-09: 2 via RESPIRATORY_TRACT
  Filled 2016-09-09: qty 6.7

## 2016-09-09 MED ORDER — AZITHROMYCIN 250 MG PO TABS: 250.0000 mg | ORAL_TABLET | Freq: Every day | ORAL | 0 refills | Status: DC

## 2016-09-09 MED ORDER — IPRATROPIUM BROMIDE 0.02 % IN SOLN
0.5000 mg | Freq: Once | RESPIRATORY_TRACT | Status: AC
  Administered 2016-09-09: 0.5 mg via RESPIRATORY_TRACT
  Filled 2016-09-09: qty 2.5

## 2016-09-09 MED ORDER — ALBUTEROL SULFATE (2.5 MG/3ML) 0.083% IN NEBU
5.0000 mg | INHALATION_SOLUTION | Freq: Once | RESPIRATORY_TRACT | Status: AC
  Administered 2016-09-09: 5 mg via RESPIRATORY_TRACT
  Filled 2016-09-09: qty 6

## 2016-09-09 NOTE — ED Triage Notes (Addendum)
Pt presents for evaluation of nonradiating R sided stabbing chest pain starting yesterday. Pt. Denies hx of same, denies injury. Pt. Reports SOB/Dizziness this AM associated with CP. Pt reports ongoing dry cough x 4 weeks. Pt AxO x4.

## 2016-09-09 NOTE — ED Provider Notes (Signed)
[facility] DEPT Provider Note   CSN: [id number] Arrival date & time: 09/09/16  [id number]     History   Chief Complaint Chief Complaint  Patient presents with  . Chest Pain    HPI Jay Montgomery is a 30 y.o. male.  Patient presents to the emergency department with chief complaint of chest pain. He states that he has had an upper respiratory tract infection for the past several weeks, and reports ongoing dry cough 4 weeks. He states that last night he began noticing some right-sided chest pain. He states that this worsened this morning, and he came to the emergency department for further evaluation. He denies any new fevers or chills. Denies any nausea, vomiting, or diarrhea. He has been taking over-the-counter sinus medication with good relief of his symptoms. He states that he now feels short of breath and felt dizzy this morning. He denies any history of PE or DVT. Denies any lower extremity swelling. Denies any history of ACS. He does not smoke. He denies any COPD or asthma history.   The history is provided by the patient. No language interpreter was used.    Past Medical History:  Diagnosis Date  . Heart murmur     There are no active problems to display for this patient.   Past Surgical History:  Procedure Laterality Date  . ADENOIDECTOMY    . TONSILLECTOMY         Home Medications    Prior to Admission medications   Medication Sig Start Date End Date Taking? Authorizing Provider  acetaminophen (TYLENOL) 500 MG tablet Take 1,000 mg by mouth every 6 (six) hours as needed for mild pain, moderate pain or headache.    Historical Provider, MD    Family History No family history on file.  Social History Social History  Substance Use Topics  . Smoking status: Current Some Day Smoker  . Smokeless tobacco: Never Used  . Alcohol use No     Allergies   Patient has no known allergies.   Review of Systems Review of Systems  Respiratory: Positive for  cough and shortness of breath.   Cardiovascular: Positive for chest pain.  All other systems reviewed and are negative.    Physical Exam Updated Vital Signs BP 132/85 (BP Location: Right Arm)   Pulse 107   Temp 97.8 F (36.6 C) (Oral)   Resp 24   SpO2 98%   Physical Exam  Constitutional: He is oriented to person, place, and time. He appears well-developed and well-nourished.  HENT:  Head: Normocephalic and atraumatic.  Eyes: Conjunctivae and EOM are normal. Pupils are equal, round, and reactive to light. Right eye exhibits no discharge. Left eye exhibits no discharge. No scleral icterus.  Neck: Normal range of motion. Neck supple. No JVD present.  Cardiovascular: Normal rate, regular rhythm and normal heart sounds.  Exam reveals no gallop and no friction rub.   No murmur heard. Pulmonary/Chest: Effort normal and breath sounds normal. No respiratory distress. He has no wheezes. He has no rales. He exhibits no tenderness.  Clear to auscultation bilaterally Chest wall and ribs nontender to palpation  Abdominal: Soft. He exhibits no distension and no mass. There is no tenderness. There is no rebound and no guarding.  Musculoskeletal: Normal range of motion. He exhibits no edema or tenderness.  Neurological: He is alert and oriented to person, place, and time.  Skin: Skin is warm and dry.  Psychiatric: He has a normal mood and affect. His behavior is  normal. Judgment and thought content normal.  Nursing note and vitals reviewed.    ED Treatments / Results  Labs (all labs ordered are listed, but only abnormal results are displayed) Labs Reviewed  BASIC METABOLIC PANEL - Abnormal; Notable for the following:       Result Value   Glucose, Bld 100 (*)    All other components within normal limits  CBC  D-DIMER, QUANTITATIVE (NOT AT [facility])  I-STAT TROPOININ, ED    EKG  EKG Interpretation  Date/Time:  Thursday September 09 2016 09:55:20 EST Ventricular Rate:  130 PR  Interval:  166 QRS Duration: 86 QT Interval:  290 QTC Calculation: 426 R Axis:   94 Text Interpretation:  Sinus tachycardia Biatrial enlargement Rightward axis Baseline wander Abnormal ekg Confirmed by Carmin Muskrat  MD ([id number]) on 09/09/2016 10:18:11 AM Also confirmed by Carmin Muskrat  MD ([id number]), editor [place]  CCT, [place] ([id number])  on 09/09/2016 10:52:44 AM       Radiology Dg Chest 2 View  Result Date: 09/09/2016 CLINICAL DATA:  Chest pain and shortness of Breath EXAM: CHEST  2 VIEW COMPARISON:  05/02/2015 FINDINGS: The heart size and mediastinal contours are within normal limits. Both lungs are clear. The visualized skeletal structures are unremarkable. IMPRESSION: No active cardiopulmonary disease. Electronically Signed   By: Inez Catalina M.D.   On: 09/09/2016 10:27    Procedures Procedures (including critical care time)  Medications Ordered in ED Medications - No data to display   Initial Impression / Assessment and Plan / ED Course  I have reviewed the triage vital signs and the nursing notes.  Pertinent labs & imaging results that were available during my care of the patient were reviewed by me and considered in my medical decision making (see chart for details).  Clinical Course     Patient with right-sided chest pain. Symptoms start last night and worsened this morning. He has had a URI. Chest x-ray negative for pneumonia. No fevers. No visible rib fractures, no tenderness to palpation on exam. He does feel like it's hard to catch his breath, and is slightly tachycardic. Cannot use PERC criteria, will check d-dimer.  D-dimer is negative. Very low suspicion for ACS in this young and otherwise healthy patient. I suspect that the patient has a URI, possible pleurisy. Will treat with ibuprofen, and supportive therapy for URI. Patient did have improvement with breathing treatment, but never had any wheezing on exam. I will send the patient home with an inhaler. His vital  signs are stable. He is well-appearing. He is not in any apparent distress.  Final Clinical Impressions(s) / ED Diagnoses   Final diagnoses:  Upper respiratory tract infection, unspecified type  Pleurisy    New Prescriptions Discharge Medication List as of 09/09/2016 11:47 AM    START taking these medications   Details  azithromycin (ZITHROMAX) 250 MG tablet Take 1 tablet (250 mg total) by mouth daily. Take first 2 tablets together, then 1 every day until finished., Starting Thu 09/09/2016, Print    HYDROcodone-homatropine (HYCODAN) 5-1.5 MG/5ML syrup Take 5 mLs by mouth every 6 (six) hours as needed for cough., Starting Thu 09/09/2016, Print    ibuprofen (ADVIL,MOTRIN) 800 MG tablet Take 1 tablet (800 mg total) by mouth 3 (three) times daily., Starting Thu 09/09/2016, Print         Montine Circle, PA-C 09/09/16 [place], MD 09/15/16 [phone]

## 2016-09-09 NOTE — ED Notes (Signed)
NAD at this time. Pt is stable and going home.  

## 2016-12-13 ENCOUNTER — Encounter (HOSPITAL_COMMUNITY): Payer: Self-pay | Admitting: Emergency Medicine

## 2016-12-13 ENCOUNTER — Emergency Department (HOSPITAL_COMMUNITY): Payer: Managed Care, Other (non HMO)

## 2016-12-13 ENCOUNTER — Emergency Department (HOSPITAL_COMMUNITY)
Admission: EM | Admit: 2016-12-13 | Discharge: 2016-12-14 | Disposition: A | Discharge status: Home / Self Care | Payer: Managed Care, Other (non HMO) | Attending: Emergency Medicine | Admitting: Emergency Medicine

## 2016-12-13 DIAGNOSIS — Y9389 Activity, other specified: Secondary | ICD-10-CM | POA: Insufficient documentation

## 2016-12-13 DIAGNOSIS — F17200 Nicotine dependence, unspecified, uncomplicated: Secondary | ICD-10-CM | POA: Insufficient documentation

## 2016-12-13 DIAGNOSIS — S39012A Strain of muscle, fascia and tendon of lower back, initial encounter: Secondary | ICD-10-CM | POA: Insufficient documentation

## 2016-12-13 DIAGNOSIS — X500XXA Overexertion from strenuous movement or load, initial encounter: Secondary | ICD-10-CM | POA: Diagnosis not present

## 2016-12-13 DIAGNOSIS — S3992XA Unspecified injury of lower back, initial encounter: Secondary | ICD-10-CM | POA: Diagnosis present

## 2016-12-13 DIAGNOSIS — Y9289 Other specified places as the place of occurrence of the external cause: Secondary | ICD-10-CM | POA: Diagnosis not present

## 2016-12-13 DIAGNOSIS — Y999 Unspecified external cause status: Secondary | ICD-10-CM | POA: Insufficient documentation

## 2016-12-13 MED ORDER — HYDROMORPHONE HCL 1 MG/ML IJ SOLN
1.0000 mg | Freq: Once | INTRAMUSCULAR | Status: AC
  Administered 2016-12-13: 1 mg via INTRAMUSCULAR
  Filled 2016-12-13: qty 1

## 2016-12-13 MED ORDER — KETOROLAC TROMETHAMINE 60 MG/2ML IM SOLN
60.0000 mg | Freq: Once | INTRAMUSCULAR | Status: AC
  Administered 2016-12-13: 60 mg via INTRAMUSCULAR
  Filled 2016-12-13: qty 2

## 2016-12-13 MED ORDER — CYCLOBENZAPRINE HCL 10 MG PO TABS: 10.0000 mg | ORAL_TABLET | Freq: Two times a day (BID) | ORAL | 0 refills | Status: DC | PRN

## 2016-12-13 MED ORDER — METHYLPREDNISOLONE 4 MG PO TBPK: ORAL_TABLET | ORAL | 0 refills | Status: DC

## 2016-12-13 MED ORDER — OXYCODONE-ACETAMINOPHEN 5-325 MG PO TABS
ORAL_TABLET | ORAL | Status: AC
  Filled 2016-12-13: qty 1

## 2016-12-13 MED ORDER — HYDROCODONE-ACETAMINOPHEN 5-325 MG PO TABS: 1.0000 | ORAL_TABLET | Freq: Four times a day (QID) | ORAL | 0 refills | Status: DC | PRN

## 2016-12-13 MED ORDER — OXYCODONE-ACETAMINOPHEN 5-325 MG PO TABS
1.0000 | ORAL_TABLET | Freq: Once | ORAL | Status: AC
  Administered 2016-12-13: 1 via ORAL

## 2016-12-13 MED ORDER — NAPROXEN 500 MG PO TABS: 500.0000 mg | ORAL_TABLET | Freq: Two times a day (BID) | ORAL | 0 refills | Status: DC

## 2016-12-13 NOTE — ED Triage Notes (Signed)
Pt. reports low back pain onset this afternoon injured while lifting a heavy couch , pain increases with movement /changing positions , denies hematuria or dysuria .

## 2016-12-13 NOTE — ED Provider Notes (Signed)
[facility] DEPT Provider Note   CSN: [id number] Arrival date & time: 12/13/16  2103     History   Chief Complaint Chief Complaint  Patient presents with  . Back Pain    HPI Jay Montgomery is a 31 y.o. male.  The history is provided by the patient. No language interpreter was used.  Back Pain     Jay Montgomery is a 31 y.o. male who presents to the Emergency Department complaining of back pain. He presents for evaluation of acute onset low back pain that occurred when he was lifting a heavy couch. Pain is located in his mid back and radiates up and down his back. He denies any numbness, weakness, urinary symptoms, abdominal pain, fevers, nausea, vomiting. Pain is worse with movement. Past Medical History:  Diagnosis Date  . Heart murmur     There are no active problems to display for this patient.   Past Surgical History:  Procedure Laterality Date  . ADENOIDECTOMY    . TONSILLECTOMY         Home Medications    Prior to Admission medications   Medication Sig Start Date End Date Taking? Authorizing Provider  acetaminophen (TYLENOL) 325 MG tablet Take 500 mg by mouth every 6 (six) hours as needed for mild pain, moderate pain or headache.     Historical Provider, MD  azithromycin (ZITHROMAX) 250 MG tablet Take 1 tablet (250 mg total) by mouth daily. Take first 2 tablets together, then 1 every day until finished. 09/09/16   Montine Circle, PA-C  HYDROcodone-homatropine [facility]) 5-1.5 MG/5ML syrup Take 5 mLs by mouth every 6 (six) hours as needed for cough. 09/09/16   Montine Circle, PA-C  ibuprofen (ADVIL,MOTRIN) 800 MG tablet Take 1 tablet (800 mg total) by mouth 3 (three) times daily. 09/09/16   Montine Circle, PA-C  Pseudoeph-Doxylamine-DM-APAP (NYQUIL PO) Take 30 mLs by mouth at bedtime as needed (cough).    Historical Provider, MD    Family History No family history on file.  Social History Social History  Substance Use Topics  . Smoking  status: Current Some Day Smoker  . Smokeless tobacco: Never Used  . Alcohol use No     Allergies   Patient has no known allergies.   Review of Systems Review of Systems  Musculoskeletal: Positive for back pain.  All other systems reviewed and are negative.    Physical Exam Updated Vital Signs BP (!) 144/87 (BP Location: Left Arm)   Pulse 71   Temp 98.1 F (36.7 C) (Oral)   Resp 18   Ht 6\' 4"  (1.93 m)   Wt 240 lb (108.9 kg)   SpO2 99%   BMI 29.21 kg/m   Physical Exam  Constitutional: He is oriented to person, place, and time. He appears well-developed and well-nourished.  HENT:  Head: Normocephalic and atraumatic.  Cardiovascular: Normal rate and regular rhythm.   No murmur heard. Pulmonary/Chest: Effort normal and breath sounds normal. No respiratory distress.  Abdominal: Soft. There is no tenderness. There is no rebound and no guarding.  Musculoskeletal: He exhibits no edema.  Tenderness to palpation throughout the lumbar spine without any step-offs or deformity. No paraspinous tenderness to palpation. 2+ DP pulses bilaterally  Neurological: He is alert and oriented to person, place, and time.  5 out of 5 strength in all 4 extremities with sensation to light touch intact in all 4 extremities.  Skin: Skin is warm and dry.  Psychiatric: He has a normal mood and affect.  His behavior is normal.  Nursing note and vitals reviewed.    ED Treatments / Results  Labs (all labs ordered are listed, but only abnormal results are displayed) Labs Reviewed - No data to display  EKG  EKG Interpretation None       Radiology Dg Lumbar Spine Complete  Result Date: 12/13/2016 CLINICAL DATA:  Low back pain while moving a couch today. EXAM: LUMBAR SPINE - COMPLETE 4+ VIEW COMPARISON:  09/25/2012 FINDINGS: Vertebral body heights and alignment are within normal limits. Mild straightening of lumbar lordosis. Interval development small anterior osteophytes at L1 and L2. 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 No acute fracture noted. Chronic mild superior endplate compression suggested of L5 as before. There is facet sclerosis and mild hypertrophy at L5-S1. IMPRESSION: No acute osseous abnormality. Remote appearing superior endplate compression of L5 without retropulsion. Electronically Signed   By: Ashley Royalty M.D.   On: 12/13/2016 22:30    Procedures Procedures (including critical care time)  Medications Ordered in ED Medications  ketorolac (TORADOL) injection 60 mg (not administered)  HYDROmorphone (DILAUDID) injection 1 mg (not administered)  oxyCODONE-acetaminophen (PERCOCET/ROXICET) 5-325 MG per tablet 1 tablet (1 tablet Oral Given 12/13/16 2126)     Initial Impression / Assessment and Plan / ED Course  I have reviewed the triage vital signs and the nursing notes.  Pertinent labs & imaging results that were available during my care of the patient were reviewed by me and considered in my medical decision making (see chart for details).     Patient here for evaluation of low back pain after a lifting injury. He is neurovascularly intact on examination with no evidence of acute fracture, acute cauda equina. Discussed with patient home care for lumbar strain with rest, outpatient follow-up, and return precautions.  Final Clinical Impressions(s) / ED Diagnoses   Final diagnoses:  None    New Prescriptions New Prescriptions   No medications on file     Quintella Reichert, MD 12/13/16 2301

## 2016-12-14 NOTE — ED Notes (Signed)
Pt verbalized understanding of d/c instructions and has no further questions. Pt is stable, A&Ox4, VSS.  

## 2016-12-29 ENCOUNTER — Encounter: Payer: Self-pay | Admitting: Family Medicine

## 2016-12-29 ENCOUNTER — Ambulatory Visit (INDEPENDENT_AMBULATORY_CARE_PROVIDER_SITE_OTHER): Payer: Managed Care, Other (non HMO) | Admitting: Family Medicine

## 2016-12-29 VITALS — BP 120/90 | HR 97 | Resp 12 | Ht 76.0 in | Wt 232.5 lb

## 2016-12-29 DIAGNOSIS — M545 Low back pain, unspecified: Secondary | ICD-10-CM

## 2016-12-29 DIAGNOSIS — G8929 Other chronic pain: Secondary | ICD-10-CM | POA: Diagnosis not present

## 2016-12-29 DIAGNOSIS — R030 Elevated blood-pressure reading, without diagnosis of hypertension: Secondary | ICD-10-CM

## 2016-12-29 MED ORDER — KETOROLAC TROMETHAMINE 60 MG/2ML IM SOLN
60.0000 mg | Freq: Once | INTRAMUSCULAR | Status: AC
Start: 2016-12-29 — End: 2016-12-29
  Administered 2016-12-29: 60 mg via INTRAMUSCULAR

## 2016-12-29 MED ORDER — DICLOFENAC SODIUM 75 MG PO TBEC: 75.0000 mg | DELAYED_RELEASE_TABLET | Freq: Two times a day (BID) | ORAL | 0 refills | Status: DC | PRN

## 2016-12-29 MED ORDER — METHOCARBAMOL 500 MG PO TABS: 500.0000 mg | ORAL_TABLET | Freq: Three times a day (TID) | ORAL | 1 refills | Status: DC | PRN

## 2016-12-29 NOTE — Progress Notes (Signed)
Pre visit review using our clinic review tool, if applicable. No additional management support is needed unless otherwise documented below in the visit note. 

## 2016-12-29 NOTE — Patient Instructions (Addendum)
A few things to remember from today's visit:   Acute exacerbation of chronic low back pain - Plan: diclofenac (VOLTAREN) 75 MG EC tablet, methocarbamol (ROBAXIN) 500 MG tablet, Ambulatory referral to Orthopedic Surgery  Elevated blood pressure reading  Do not start Diclofenac until about 8 hours.  Methocarbamol causes drowsiness.  Smoking cessation encouraged.  Consider scheduling a physical.  Monitor blood pressure periodically,it should be less than 140/90.  DASH diet recommended: high in vegetables, fruits, low-fat dairy products, whole grains, poultry, fish, and nuts; and low in sweets, sugar-sweetened beverages, and red meats.    Please be sure medication list is accurate. If a new problem present, please set up appointment sooner than planned today.

## 2016-12-29 NOTE — Progress Notes (Signed)
HPI:   JayJay Montgomery is a 31 y.o. male, who is here today with his girlfriend to establish care.  Former PCP: N/A Last preventive routine visit: 2015.  Chronic medical problems: Back pain,HLD, elevated BP readings. He is also reporting that he was "born" with chronic bronchitis. + Smoker. He does not follow a healthy diet, he doesn't exercise regularly but states that he has an active job.  Concerns today: Persistent back pain.  He was recently seen in the ER, 12/13/2016, due to back pain. He tells me that he was told pain was muscle related but he does not believe it. He states that he can feel "bone against bone" when he moves. Lumbar X ray done 12/13/16: No acute osseous abnormality. Remote appearing superior endplate compression of L5 without retropulsion.   According to patient, he has had back pain for a while, it is not daily, this time he feels like it is worse that episodes he has previously. According to girlfriend, pain is severe, Reporting seeing  him crying due to pain. He denies any recent injury or unusual level of activity.  Pain is not radiated, sharp like, severe in intensity, with no associated LE numbness, tingling, urinary incontinence or retention, stool incontinence, or saddle anesthesia.  Currently pain is exacerbated by movement (bending,lifting), also upon standing after prolonged sitting.   He usually has pain the next day after he engages in certain activities:cleaning car for example. He can complete the task with no problem but next day he starts with back pain. It usually last a few days but this time it is not getting any better. Alleviated by rest and changes in position. No rash or edema on area, fever, chills, or abnormal wt loss.   OTC medications: Aleve/ibuprofen. He has not taking any medication today.  He has not seen orthopedics the past and has not tried PT.  Review of Systems  Constitutional: Negative for appetite change,  chills, fatigue, fever and unexpected weight change.  HENT: Negative for mouth sores, nosebleeds and sore throat.   Respiratory: Negative for cough, shortness of breath and wheezing.   Cardiovascular: Negative for chest pain, palpitations and leg swelling.  Gastrointestinal: Negative for abdominal pain, nausea and vomiting.       Denies changes in bowel habits.  Genitourinary: Negative for decreased urine volume, difficulty urinating, dysuria, frequency, hematuria and urgency.  Musculoskeletal: Positive for back pain. Negative for joint swelling and neck pain.  Skin: Negative for color change and rash.  Neurological: Negative for syncope, weakness, numbness and headaches.  Psychiatric/Behavioral: Negative for confusion. The patient is not nervous/anxious.     No current outpatient prescriptions on file prior to visit.   No current facility-administered medications on file prior to visit.     Past Medical History:  Diagnosis Date  . Chicken pox   . Emphysema of lung ([facility])   . Heart murmur   . Hyperlipidemia   . Hypertension    elevated BP readings.   No Known Allergies  Family History  Problem Relation Age of Onset  . Arthritis Mother   . Hypertension Mother   . Arthritis Father   . Diabetes Father     Social History   Social History  . Marital status: Married    Spouse name: N/A  . Number of children: N/A  . Years of education: N/A   Social History Main Topics  . Smoking status: Current Some Day Smoker  . Smokeless tobacco: Never  Used  . Alcohol use No  . Drug use: No  . Sexual activity: Not Asked   Other Topics Concern  . None   Social History Narrative  . None    Vitals:   12/29/16 0908  BP: 120/90  Pulse: 97  Resp: 12   O2 sat at RA 98%. Body mass index is 28.3 kg/m.  Physical Exam  Nursing note and vitals reviewed. Constitutional: He is oriented to person, place, and time. He appears well-developed. No distress.  HENT:  Head: Atraumatic.    Mouth/Throat: Oropharynx is clear and moist and mucous membranes are normal.  Eyes: Conjunctivae and EOM are normal.  Cardiovascular: Normal rate and regular rhythm.   No murmur heard. Pulses:      Dorsalis pedis pulses are 2+ on the right side, and 2+ on the left side.  Respiratory: Effort normal and breath sounds normal. No respiratory distress.  GI: Soft. He exhibits no mass. There is no hepatomegaly. There is no tenderness.  Musculoskeletal: He exhibits no edema.       Lumbar back: He exhibits decreased range of motion. He exhibits no bony tenderness.  Mild scoliosis appreciated. + Tenderness upon palpation of paraspinal muscles, bilateral: rigth from T12 and Left from L3 down. Pain elicited with movement on exam table during examination. No local edema or erythema appreciated, no suspicious lesions.  Lymphadenopathy:    He has no cervical adenopathy.  Neurological: He is alert and oriented to person, place, and time. He has normal strength. Coordination normal.  Reflex Scores:      Patellar reflexes are 2+ on the right side and 2+ on the left side.      Achilles reflexes are 2+ on the right side and 2+ on the left side. SLR negative bilateral. Can walk on heels and tip toes. Antalgic gait.   Skin: Skin is warm. No erythema.  Psychiatric: He has a normal mood and affect.  Well groomed,good eye contact.    ASSESSMENT AND PLAN:   Jay Montgomery was seen today for establish care.  Diagnoses and all orders for this visit:  Acute exacerbation of chronic low back pain  Here in the office after verbal consent she received Toradol 60 mg IM x 1. He will continue with muscle relaxants and oral Diclofenac (which he can start in about 8 hours). Some side effects of medications were discussed. Relative rest. Instructed about warning signs. Appointment with orthopedist will be arranged.  -     diclofenac (VOLTAREN) 75 MG EC tablet; Take 1 tablet (75 mg total) by mouth 2 (two) times  daily as needed. With food -     methocarbamol (ROBAXIN) 500 MG tablet; Take 1 tablet (500 mg total) by mouth every 8 (eight) hours as needed for muscle spasms. -     Ambulatory referral to Orthopedic Surgery -     ketorolac (TORADOL) injection 60 mg; Inject 2 mLs (60 mg total) into the muscle once.  Elevated blood pressure reading  Mildly elevated today. Recommended monitoring BP at home. Low salt diet recommended as well as smoking cessation.      Jacquelynn Friend G. Martinique, MD  [facility]. [place] office.

## 2017-01-11 ENCOUNTER — Telehealth: Payer: Self-pay | Admitting: Family Medicine

## 2017-01-11 NOTE — Telephone Encounter (Signed)
Pt states he cannot see the ortho md until 5/23. Pt states the meds dr Martinique prescribed him are not working and he doesn't know what to do now.  Please advise  [place], [place]

## 2017-01-11 NOTE — Telephone Encounter (Signed)
We could try a different muscle relaxant, try another NSAID's like Celebrex or OTC Ibuprofen. Thanks, BJ

## 2017-01-12 NOTE — Telephone Encounter (Signed)
Tried contacting patient, VM not set up.

## 2017-01-13 NOTE — Telephone Encounter (Signed)
Tried contacting patient, VM not set up.

## 2017-01-17 NOTE — Telephone Encounter (Signed)
Tried contacting patient, VM not set up.

## 2017-01-17 NOTE — Telephone Encounter (Signed)
I have tried contacting patient multiple times, with no answer & no return phone call. Do you want to send in the Celebrex, or close the phone note?

## 2017-02-09 ENCOUNTER — Encounter (INDEPENDENT_AMBULATORY_CARE_PROVIDER_SITE_OTHER): Payer: Self-pay | Admitting: Orthopaedic Surgery

## 2017-02-09 ENCOUNTER — Ambulatory Visit (INDEPENDENT_AMBULATORY_CARE_PROVIDER_SITE_OTHER): Payer: Managed Care, Other (non HMO) | Admitting: Orthopaedic Surgery

## 2017-02-09 VITALS — BP 139/97 | HR 75

## 2017-02-09 DIAGNOSIS — M545 Low back pain: Secondary | ICD-10-CM | POA: Diagnosis not present

## 2017-02-09 DIAGNOSIS — G8929 Other chronic pain: Secondary | ICD-10-CM | POA: Diagnosis not present

## 2017-02-09 DIAGNOSIS — M47816 Spondylosis without myelopathy or radiculopathy, lumbar region: Secondary | ICD-10-CM
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
## 2017-02-10 ENCOUNTER — Ambulatory Visit
Admission: RE | Admit: 2017-02-10 | Discharge: 2017-02-10 | Disposition: A | Discharge status: Home / Self Care | Payer: Managed Care, Other (non HMO) | Source: Ambulatory Visit | Attending: Surgery | Admitting: Surgery

## 2017-02-10 DIAGNOSIS — M545 Low back pain: Principal | ICD-10-CM

## 2017-02-10 DIAGNOSIS — G8929 Other chronic pain: Secondary | ICD-10-CM

## 2017-02-15 ENCOUNTER — Ambulatory Visit (INDEPENDENT_AMBULATORY_CARE_PROVIDER_SITE_OTHER): Payer: Managed Care, Other (non HMO) | Admitting: Orthopaedic Surgery

## 2017-02-15 ENCOUNTER — Encounter (INDEPENDENT_AMBULATORY_CARE_PROVIDER_SITE_OTHER): Payer: Self-pay | Admitting: Orthopaedic Surgery

## 2017-02-15 VITALS — BP 141/98 | HR 91 | Ht 75.0 in | Wt 240.0 lb

## 2017-02-15 DIAGNOSIS — M48061 Spinal stenosis, lumbar region without neurogenic claudication: Secondary | ICD-10-CM | POA: Diagnosis not present

## 2017-02-15 DIAGNOSIS — Q7649 Other congenital malformations of spine, not associated with scoliosis: Secondary | ICD-10-CM | POA: Diagnosis not present
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
## 2017-02-22 ENCOUNTER — Telehealth (INDEPENDENT_AMBULATORY_CARE_PROVIDER_SITE_OTHER): Payer: Self-pay | Admitting: Orthopaedic Surgery

## 2017-02-22 NOTE — Telephone Encounter (Signed)
Called patient and left message advising patient that this had been submitted and we are awaitng approval.

## 2017-02-22 NOTE — Telephone Encounter (Signed)
Please advise what you would like to do. Thanks.

## 2017-02-22 NOTE — Telephone Encounter (Signed)
Please advise 

## 2017-02-22 NOTE — Telephone Encounter (Signed)
Patient called and asked the status of the approval for the steroid injection in his back. CB # [phone]

## 2017-02-23 NOTE — Telephone Encounter (Signed)
Tylenol, ibuprofen in February and March. IM Toradol 12/13/2016 emergency room visit. Robaxin and Voltaren 12/29/2016.

## 2017-02-23 NOTE — Telephone Encounter (Signed)
Fyi. Dr Lorin Mercy called patient and gave him all of the information. He advised patient to call his insurance company.

## 2017-02-23 NOTE — Telephone Encounter (Signed)
Patient has already had anti-inflammatories, muscle relaxants, intramuscular shots of Toradol and is been going to the emergency room multiple times for treatment with resultant higher health care costs. I called patient and he can call and talk to his insurance company.

## 2017-04-06 ENCOUNTER — Ambulatory Visit (INDEPENDENT_AMBULATORY_CARE_PROVIDER_SITE_OTHER): Payer: Managed Care, Other (non HMO) | Admitting: Orthopaedic Surgery

## 2017-05-19 ENCOUNTER — Emergency Department
Admission: EM | Admit: 2017-05-19 | Discharge: 2017-05-20 | Disposition: A | Discharge status: Home / Self Care | Payer: Managed Care, Other (non HMO) | Attending: Emergency Medicine | Admitting: Emergency Medicine

## 2017-05-19 DIAGNOSIS — M545 Low back pain: Secondary | ICD-10-CM

## 2017-05-19 DIAGNOSIS — M5440 Lumbago with sciatica, unspecified side: Secondary | ICD-10-CM | POA: Insufficient documentation

## 2017-05-19 DIAGNOSIS — G8929 Other chronic pain: Secondary | ICD-10-CM

## 2017-05-19 DIAGNOSIS — R200 Anesthesia of skin: Secondary | ICD-10-CM | POA: Diagnosis present

## 2017-05-19 DIAGNOSIS — R202 Paresthesia of skin: Secondary | ICD-10-CM

## 2017-05-19 DIAGNOSIS — I10 Essential (primary) hypertension: Secondary | ICD-10-CM | POA: Diagnosis not present

## 2017-05-19 DIAGNOSIS — M5416 Radiculopathy, lumbar region: Secondary | ICD-10-CM | POA: Insufficient documentation

## 2017-05-19 DIAGNOSIS — F17210 Nicotine dependence, cigarettes, uncomplicated: Secondary | ICD-10-CM | POA: Diagnosis not present

## 2017-05-19 HISTORY — DX: Spinal stenosis, site unspecified: M48.00

## 2017-05-19 NOTE — ED Triage Notes (Signed)
Patient c/o bilateral leg and foot numbness, and lower back pain. Pt reports hx of spinal stenosis.

## 2017-05-20 ENCOUNTER — Emergency Department: Payer: Managed Care, Other (non HMO)

## 2017-05-20 MED ORDER — LIDOCAINE 5 % EX PTCH: 1.0000 | MEDICATED_PATCH | Freq: Two times a day (BID) | CUTANEOUS | 0 refills | Status: DC

## 2017-05-20 MED ORDER — OXYCODONE-ACETAMINOPHEN 5-325 MG PO TABS
1.0000 | ORAL_TABLET | Freq: Once | ORAL | Status: AC
  Administered 2017-05-20: 1 via ORAL
  Filled 2017-05-20: qty 1

## 2017-05-20 MED ORDER — METHYLPREDNISOLONE 4 MG PO TBPK: ORAL_TABLET | ORAL | 0 refills | Status: DC

## 2017-05-20 MED ORDER — LIDOCAINE 5 % EX PTCH
1.0000 | MEDICATED_PATCH | CUTANEOUS | Status: DC
  Administered 2017-05-20: 1 via TRANSDERMAL
  Filled 2017-05-20: qty 1

## 2017-05-20 MED ORDER — PREDNISONE 20 MG PO TABS
60.0000 mg | ORAL_TABLET | Freq: Once | ORAL | Status: AC
  Administered 2017-05-20: 60 mg via ORAL
  Filled 2017-05-20: qty 3

## 2017-05-20 NOTE — ED Provider Notes (Signed)
[facility] Emergency Department Provider Note   ____________________________________________   First MD Initiated Contact with Patient 05/19/17 2345     (approximate)  I have reviewed the triage vital signs and the nursing notes.   HISTORY  Chief Complaint Numbness (leg)    HPI Jay Montgomery is a 31 y.o. male who comes into the hospital today with some numbness to his bilateral lower extremities. The patient states that he was diagnosed with a congenital spinal stenosis. Yesterday he states it is feet and legs became numb and cold. He reports that he could not get in to see his neurosurgeon in because the symptoms are persistent he decided to come in and get checked out. He reports that he initially didn't pay much attention to it as the numbness would come and go. 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 He reports that his leg gave away but he did not hit his head or fall to the ground. He reports that his entire legs from his thighs down are numb bilaterally. He denies any incontinence or any saddle anesthesia. He reports that his strength is still intact uterus feels numb. He also has some low back pain and pain in his hips that he rates an 8 out of 10 in intensity. The patient reports that the plan is for him to start physical therapy tomorrow and to get epidural injections. He reports that he would eventually need surgery but they're trying to delay it as long as they possibly can.   Past Medical History:  Diagnosis Date  . Chicken pox   . Emphysema of lung ([place])   . Heart murmur   . Hyperlipidemia   . Hypertension    elevated BP readings.  Marland Kitchen Spinal stenosis     Patient Active Problem List   Diagnosis Date Noted  . Spinal stenosis of lumbar region 02/15/2017    Past Surgical History:  Procedure Laterality Date  . ADENOIDECTOMY    . TONSILLECTOMY      Prior to Admission medications   Medication Sig  Start Date End Date Taking? Authorizing Provider  diclofenac (VOLTAREN) 75 MG EC tablet Take 1 tablet (75 mg total) by mouth 2 (two) times daily as needed. With food 12/29/16   [place], Betty G, MD  lidocaine (LIDODERM) 5 % Place 1 patch onto the skin every 12 (twelve) hours. Remove & Discard patch within 12 hours or as directed by MD 05/20/17 05/20/18  Loney Hering, MD  methocarbamol (ROBAXIN) 500 MG tablet Take 1 tablet (500 mg total) by mouth every 8 (eight) hours as needed for muscle spasms. 12/29/16   [place], Betty G, MD  methylPREDNISolone (MEDROL DOSEPAK) 4 MG TBPK tablet Take 6 tabs on day 1 Take 5 tabs on day 2 Take 4 tabs on day 3 Take 3 tabs on day 4 Take 2 tabs on day 5 Take 1 tab on day 6 05/20/17   Loney Hering, MD    Allergies Patient has no known allergies.  Family History  Problem Relation Age of Onset  . Arthritis Mother   . Hypertension Mother   . Arthritis Father   . Diabetes Father     Social History Social History  Substance Use Topics  . Smoking status: Current Every Day Smoker    Packs/day: 0.50    Types: Cigarettes  . Smokeless tobacco: Never Used  . Alcohol use Yes     Comment: occassional  Review of Systems  Constitutional: No fever/chills Eyes: No visual changes. ENT: No sore throat. Cardiovascular: Denies chest pain. Respiratory: Denies shortness of breath. Gastrointestinal: No abdominal pain.  No nausea, no vomiting.  No diarrhea.  No constipation. Genitourinary: Negative for dysuria. Musculoskeletal:  back pain. Skin: Negative for rash. Neurological: Bilateral lower extremity numbness.   ____________________________________________   PHYSICAL EXAM:  VITAL SIGNS: ED Triage Vitals  Enc Vitals Group     BP 05/19/17 1952 140/88     Pulse Rate 05/19/17 1952 89     Resp 05/19/17 1952 20     Temp 05/19/17 1952 98.7 F (37.1 C)     Temp Source 05/19/17 1952 Oral     SpO2 05/19/17 1952 98 %     Weight 05/19/17 1953 240 lb  (108.9 kg)     Height 05/19/17 1953 6\' 4"  (1.93 m)     Head Circumference --      Peak Flow --      Pain Score 05/19/17 1952 8     Pain Loc --      Pain Edu? --      Excl. in [place]? --     Constitutional: Alert and oriented. Well appearing and in Mild distress. Eyes: Conjunctivae are normal. PERRL. EOMI. Head: Atraumatic. Nose: No congestion/rhinnorhea. Mouth/Throat: Mucous membranes are moist.  Oropharynx non-erythematous. Cardiovascular: Normal rate, regular rhythm. Grossly normal heart sounds.  Good peripheral circulation. Respiratory: Normal respiratory effort.  No retractions. Lungs CTAB. Gastrointestinal: Soft and nontender. No distention. Positive bowel sounds Musculoskeletal: No lower extremity tenderness nor edema.   Neurologic:  Normal speech and language. Mild sensory deficit to bilateral lower extremities worse medially than laterally, no complete numbness. Strenght 5/5 in lower extremities.  Skin:  Skin is warm, dry and intact.  Psychiatric: Mood and affect are normal.   ____________________________________________   LABS (all labs ordered are listed, but only abnormal results are displayed)  Labs Reviewed - No data to display ____________________________________________  EKG  none ____________________________________________  RADIOLOGY  Mr Lumbar Spine Wo Contrast  Result Date: 05/20/2017 CLINICAL DATA:  Acute on chronic back pain, repetitive stress/lifting injuries. EXAM: MRI LUMBAR SPINE WITHOUT CONTRAST TECHNIQUE: Multiplanar, multisequence MR imaging of the lumbar spine was performed. No intravenous contrast was administered. COMPARISON:  MRI of the lumbar spine Feb 10, 2017 FINDINGS: SEGMENTATION: For the purposes of this report, the last well-formed intervertebral disc will be reported as L5-S1. ALIGNMENT: Straightened lumbar lordosis. No malalignment. VERTEBRAE:Mild old L5 superior endplate compression fracture with less than 25% height loss. Acute to  subacute L5 superior endplate Schmorl's month with additional old Schmorl's nodes at all lumbar levels. Intervertebral disc morphology generally maintained with mild desiccation. Multilevel mild chronic discogenic endplate changes. Congenital canal narrowing on the basis of foreshortened pedicles. CONUS MEDULLARIS: Conus medullaris terminates at T12-L1 and demonstrates normal morphology and signal characteristics. Cauda equina is normal. PARASPINAL AND SOFT TISSUES: Included prevertebral and paraspinal soft tissues are normal. DISC LEVELS: L1-2 and L2-3: No disc bulge, canal stenosis nor neural foraminal narrowing. L3-4: No disc bulge, canal stenosis nor neural foraminal narrowing. Mild facet arthropathy L4-5: Small broad-based disc bulge, mild facet arthropathy and ligamentum flavum redundancy. Mild canal stenosis. No neural foraminal narrowing. L5-S1: No disc bulge. Minimal RIGHT facet arthropathy. No canal stenosis. Mild RIGHT neural foraminal narrowing. IMPRESSION: 1. Stable examination: Old mild L5 compression fracture without acute fracture or malalignment. 2. Degenerative lumbar spine superimposed on congenital canal narrowing. Multilevel Schmorl's nodes. 3. Mild canal stenosis L4-5.  Mild RIGHT L5-S1 neural foraminal narrowing. Electronically Signed   By: Elon Alas M.D.   On: 05/20/2017 01:14    ____________________________________________   PROCEDURES  Procedure(s) performed: None  Procedures  Critical Care performed: No  ____________________________________________   INITIAL IMPRESSION / ASSESSMENT AND PLAN / ED COURSE  Pertinent labs & imaging results that were available during my care of the patient were reviewed by me and considered in my medical decision making (see chart for details).  This is a 31 year old male who comes into the hospital today with some leg numbness after being diagnosed with some congenital spinal stenosis. The patient does not have saddle anesthesia  nor does he have any incontinence or urinary retention but I did send him for an MRI to evaluate if there was any progression of his stenosis or any acute disc bulging. The patient's MRI was stable from his previous study. I did give the patient a dose of prednisone as well as Percocet here in the emergency department. I will send him home with a Medrol Dosepak and some Lidoderm patches. He should follow back up with his neurosurgeon for further evaluation and treatment of his symptoms.      ____________________________________________   FINAL CLINICAL IMPRESSION(S) / ED DIAGNOSES  Final diagnoses:  Paresthesia  Lumbar radiculopathy  Chronic midline low back pain, with sciatica presence unspecified      NEW MEDICATIONS STARTED DURING THIS VISIT:  Discharge Medication List as of 05/20/2017  1:32 AM    START taking these medications   Details  lidocaine (LIDODERM) 5 % Place 1 patch onto the skin every 12 (twelve) hours. Remove & Discard patch within 12 hours or as directed by MD, Starting Fri 05/20/2017, Until Sat 05/20/2018, Print    methylPREDNISolone (MEDROL DOSEPAK) 4 MG TBPK tablet Take 6 tabs on day 1 Take 5 tabs on day 2 Take 4 tabs on day 3 Take 3 tabs on day 4 Take 2 tabs on day 5 Take 1 tab on day 6, Print         Note:  This document was prepared using Dragon voice recognition software and may include unintentional dictation errors.    Loney Hering, MD 05/20/17 [id number]

## 2017-05-20 NOTE — ED Notes (Signed)
Patient transported to MRI 

## 2017-05-20 NOTE — ED Notes (Signed)
Pt back from MRI 

## 2017-05-20 NOTE — Discharge Instructions (Signed)
Please follow up with Dr. Cyndy Freeze for further evaluation of your back pain

## 2017-08-17 HISTORY — PX: BACK SURGERY: SHX140

## 2017-11-24 ENCOUNTER — Other Ambulatory Visit: Payer: Self-pay | Admitting: Neurosurgery

## 2017-11-30 ENCOUNTER — Encounter (HOSPITAL_COMMUNITY): Payer: Self-pay

## 2017-11-30 ENCOUNTER — Other Ambulatory Visit: Payer: Self-pay

## 2017-11-30 ENCOUNTER — Encounter (HOSPITAL_COMMUNITY)
Admission: RE | Admit: 2017-11-30 | Discharge: 2017-11-30 | Disposition: A | Discharge status: Home / Self Care | Payer: Managed Care, Other (non HMO) | Source: Ambulatory Visit | Attending: Neurosurgery | Admitting: Neurosurgery

## 2017-11-30 DIAGNOSIS — Z01812 Encounter for preprocedural laboratory examination: Secondary | ICD-10-CM | POA: Insufficient documentation

## 2017-11-30 HISTORY — DX: Headache, unspecified: R51.9

## 2017-11-30 HISTORY — DX: Headache: R51

## 2017-11-30 LAB — BASIC METABOLIC PANEL
Anion gap: 9 (ref 5–15)
BUN: 14 mg/dL (ref 6–20)
CO2: 24 mmol/L (ref 22–32)
Calcium: 9.4 mg/dL (ref 8.9–10.3)
Chloride: 107 mmol/L (ref 101–111)
Creatinine, Ser: 1.17 mg/dL (ref 0.61–1.24)
GFR calc Af Amer: >60 mL/min (ref >60)
Glucose, Bld: 109 mg/dL — ABNORMAL HIGH (ref 65–99)
POTASSIUM: 4.2 mmol/L (ref 3.5–5.1)
SODIUM: 140 mmol/L (ref 135–145)

## 2017-11-30 LAB — CBC WITH DIFFERENTIAL/PLATELET
BASOS ABS: 0 10*3/uL (ref 0.0–0.1)
Basophils Relative: 1 %
EOS ABS: 0.1 10*3/uL (ref 0.0–0.7)
EOS PCT: 2 %
HCT: 46.9 % (ref 39.0–52.0)
Hemoglobin: 15.9 g/dL (ref 13.0–17.0)
LYMPHS ABS: 2.1 10*3/uL (ref 0.7–4.0)
LYMPHS PCT: 29 %
MCH: 30.5 pg (ref 26.0–34.0)
MCHC: 33.9 g/dL (ref 30.0–36.0)
MCV: 89.8 fL (ref 78.0–100.0)
Monocytes Absolute: 0.5 10*3/uL (ref 0.1–1.0)
Monocytes Relative: 7 %
Neutro Abs: 4.5 10*3/uL (ref 1.7–7.7)
Neutrophils Relative %: 61 %
PLATELETS: 257 10*3/uL (ref 150–400)
RBC: 5.22 MIL/uL (ref 4.22–5.81)
RDW: 12.9 % (ref 11.5–15.5)
WBC: 7.2 10*3/uL (ref 4.0–10.5)

## 2017-11-30 LAB — SURGICAL PCR SCREEN
MRSA, PCR: NEGATIVE
Staphylococcus aureus: NEGATIVE

## 2017-11-30 NOTE — Pre-Procedure Instructions (Signed)
Jay Montgomery  11/30/2017      [place], [place] [address] [place] [address] Phone: [phone] Fax: [phone]    Your procedure is scheduled on 12/01/2017.  Report to [facility] Admitting at 0600 A.M.  Call this number if you have problems the morning of surgery:  [phone]   Remember:  Do not eat food or drink liquids after midnight.  Take these medicines the morning of surgery with A SIP OF WATER: Gabapentin (Neurontin) Methocarbamol (Robaxin) - if needed  7 days prior to surgery STOP taking any Aspirin(unless otherwise instructed by your surgeon), Aleve, Naproxen, Ibuprofen, Motrin, Advil, Goody's, BC's, all herbal medications, fish oil, and all vitamins    Do not wear jewelry.  Do not wear lotions, powders, or colognes, or deodorant.  Men may shave face and neck.  Do not bring valuables to the hospital.  [facility] is not responsible for any belongings or valuables.  Contacts, dentures or bridgework may not be worn into surgery.  Leave your suitcase in the car.  After surgery it may be brought to your room.  For patients admitted to the hospital, discharge time will be determined by your treatment team.  Patients discharged the day of surgery will not be allowed to drive home.   Name and phone number of your driver:    Special instructions:   [facility]- Preparing For Surgery  Before surgery, you can play an important role. Because skin is not sterile, your skin needs to be as free of germs as possible. You can reduce the number of germs on your skin by washing with CHG (chlorahexidine gluconate) Soap before surgery.  CHG is an antiseptic cleaner which kills germs and bonds with the skin to continue killing germs even after washing.  Please do not use if you have an allergy to CHG or antibacterial soaps. If your skin becomes reddened/irritated stop using the CHG.  Do not  shave (including legs and underarms) for at least 48 hours prior to first CHG shower. It is OK to shave your face.  Please follow these instructions carefully.   1. Shower the NIGHT BEFORE SURGERY and the MORNING OF SURGERY with CHG.   2. If you chose to wash your hair, wash your hair first as usual with your normal shampoo.  3. After you shampoo, rinse your hair and body thoroughly to remove the shampoo.  4. Use CHG as you would any other liquid soap. You can apply CHG directly to the skin and wash gently with a scrungie or a clean washcloth.   5. Apply the CHG Soap to your body ONLY FROM THE NECK DOWN.  Do not use on open wounds or open sores. Avoid contact with your eyes, ears, mouth and genitals (private parts). Wash Face and genitals (private parts)  with your normal soap.  6. Wash thoroughly, paying special attention to the area where your surgery will be performed.  7. Thoroughly rinse your body with warm water from the neck down.  8. DO NOT shower/wash with your normal soap after using and rinsing off the CHG Soap.  9. Pat yourself dry with a CLEAN TOWEL.  10. Wear CLEAN PAJAMAS to bed the night before surgery, wear comfortable clothes the morning of surgery  11. Place CLEAN SHEETS on your bed the night of your first shower and DO NOT SLEEP WITH PETS.    Day of Surgery:  Shower as stated above. Do not apply any deodorants/lotions. Please wear clean clothes to the hospital/surgery center.      Please read over the following fact sheets that you were given.

## 2017-12-01 ENCOUNTER — Encounter (HOSPITAL_COMMUNITY): Payer: Self-pay | Admitting: *Deleted

## 2017-12-01 ENCOUNTER — Inpatient Hospital Stay (HOSPITAL_COMMUNITY)
Admission: RE | Admit: 2017-12-01 | Discharge: 2017-12-06 | DRG: 092 | Disposition: A | Discharge status: Home / Self Care | Payer: Managed Care, Other (non HMO) | Source: Ambulatory Visit | Attending: Neurosurgery | Admitting: Neurosurgery

## 2017-12-01 ENCOUNTER — Inpatient Hospital Stay (HOSPITAL_COMMUNITY)
Admission: RE | Disposition: A | Discharge status: Home / Self Care | Payer: Self-pay | Source: Ambulatory Visit | Attending: Neurosurgery

## 2017-12-01 ENCOUNTER — Inpatient Hospital Stay (HOSPITAL_COMMUNITY): Payer: Managed Care, Other (non HMO) | Admitting: Certified Registered"

## 2017-12-01 DIAGNOSIS — Z79899 Other long term (current) drug therapy: Secondary | ICD-10-CM | POA: Diagnosis not present

## 2017-12-01 DIAGNOSIS — R51 Headache: Secondary | ICD-10-CM | POA: Diagnosis not present

## 2017-12-01 DIAGNOSIS — G9600 Cerebrospinal fluid leak, unspecified: Secondary | ICD-10-CM | POA: Diagnosis present

## 2017-12-01 DIAGNOSIS — G9782 Other postprocedural complications and disorders of nervous system: Secondary | ICD-10-CM | POA: Diagnosis present

## 2017-12-01 DIAGNOSIS — G9619 Other disorders of meninges, not elsewhere classified: Principal | ICD-10-CM | POA: Diagnosis present

## 2017-12-01 DIAGNOSIS — Z87891 Personal history of nicotine dependence: Secondary | ICD-10-CM

## 2017-12-01 HISTORY — PX: LUMBAR LAMINECTOMY/DECOMPRESSION MICRODISCECTOMY: SHX5026

## 2017-12-01 SURGERY — LUMBAR LAMINECTOMY/DECOMPRESSION MICRODISCECTOMY 1 LEVEL
Anesthesia: General | Site: Back

## 2017-12-01 MED ORDER — CHLORHEXIDINE GLUCONATE CLOTH 2 % EX PADS: 6.0000 | MEDICATED_PAD | Freq: Once | CUTANEOUS | Status: DC

## 2017-12-01 MED ORDER — HYDROCODONE-ACETAMINOPHEN 5-325 MG PO TABS
1.0000 | ORAL_TABLET | ORAL | Status: DC | PRN
  Administered 2017-12-02 – 2017-12-05 (×4): 1 via ORAL
  Filled 2017-12-01 (×4): qty 1

## 2017-12-01 MED ORDER — CEFAZOLIN SODIUM-DEXTROSE 2-4 GM/100ML-% IV SOLN
INTRAVENOUS | Status: AC
  Filled 2017-12-01: qty 100

## 2017-12-01 MED ORDER — FENTANYL CITRATE (PF) 250 MCG/5ML IJ SOLN
INTRAMUSCULAR | Status: AC
  Filled 2017-12-01: qty 5

## 2017-12-01 MED ORDER — ROCURONIUM BROMIDE 10 MG/ML (PF) SYRINGE
PREFILLED_SYRINGE | INTRAVENOUS | Status: DC | PRN
  Administered 2017-12-01: 20 mg via INTRAVENOUS
  Administered 2017-12-01: 50 mg via INTRAVENOUS

## 2017-12-01 MED ORDER — SODIUM CHLORIDE 0.9% FLUSH
3.0000 mL | Freq: Two times a day (BID) | INTRAVENOUS | Status: DC
  Administered 2017-12-01 – 2017-12-05 (×7): 3 mL via INTRAVENOUS

## 2017-12-01 MED ORDER — LACTATED RINGERS IV SOLN
INTRAVENOUS | Status: DC
  Administered 2017-12-01: 07:00:00 via INTRAVENOUS

## 2017-12-01 MED ORDER — DEXAMETHASONE SODIUM PHOSPHATE 10 MG/ML IJ SOLN
10.0000 mg | INTRAMUSCULAR | Status: AC
  Administered 2017-12-01: 10 mg via INTRAVENOUS

## 2017-12-01 MED ORDER — OXYCODONE HCL 5 MG PO TABS: 5.0000 mg | ORAL_TABLET | Freq: Once | ORAL | Status: DC | PRN

## 2017-12-01 MED ORDER — BACITRACIN 50000 UNITS IM SOLR
INTRAMUSCULAR | Status: DC | PRN
  Administered 2017-12-01: 09:00:00

## 2017-12-01 MED ORDER — SODIUM CHLORIDE 0.9% FLUSH: 3.0000 mL | INTRAVENOUS | Status: DC | PRN

## 2017-12-01 MED ORDER — ALUM & MAG HYDROXIDE-SIMETH 200-200-20 MG/5ML PO SUSP: 30.0000 mL | Freq: Four times a day (QID) | ORAL | Status: DC | PRN

## 2017-12-01 MED ORDER — CYCLOBENZAPRINE HCL 10 MG PO TABS
10.0000 mg | ORAL_TABLET | Freq: Three times a day (TID) | ORAL | Status: DC | PRN
  Administered 2017-12-01 – 2017-12-06 (×7): 10 mg via ORAL
  Filled 2017-12-01 (×7): qty 1

## 2017-12-01 MED ORDER — ONDANSETRON HCL 4 MG/2ML IJ SOLN: 4.0000 mg | Freq: Four times a day (QID) | INTRAMUSCULAR | Status: DC | PRN

## 2017-12-01 MED ORDER — ACETAMINOPHEN 650 MG RE SUPP: 650.0000 mg | RECTAL | Status: DC | PRN

## 2017-12-01 MED ORDER — MIDAZOLAM HCL 2 MG/2ML IJ SOLN
INTRAMUSCULAR | Status: AC
  Filled 2017-12-01: qty 2

## 2017-12-01 MED ORDER — CEFAZOLIN SODIUM-DEXTROSE 1-4 GM/50ML-% IV SOLN
1.0000 g | Freq: Three times a day (TID) | INTRAVENOUS | Status: DC
  Administered 2017-12-01 – 2017-12-06 (×15): 1 g via INTRAVENOUS
  Filled 2017-12-01 (×18): qty 50

## 2017-12-01 MED ORDER — BUPIVACAINE HCL (PF) 0.25 % IJ SOLN
INTRAMUSCULAR | Status: DC | PRN
  Administered 2017-12-01: 20 mL

## 2017-12-01 MED ORDER — ARTIFICIAL TEARS OPHTHALMIC OINT
TOPICAL_OINTMENT | OPHTHALMIC | Status: DC | PRN
  Administered 2017-12-01: 1 via OPHTHALMIC

## 2017-12-01 MED ORDER — PROMETHAZINE HCL 25 MG/ML IJ SOLN: 6.2500 mg | INTRAMUSCULAR | Status: DC | PRN

## 2017-12-01 MED ORDER — FENTANYL CITRATE (PF) 250 MCG/5ML IJ SOLN
INTRAMUSCULAR | Status: DC | PRN
  Administered 2017-12-01: 100 ug via INTRAVENOUS
  Administered 2017-12-01 (×3): 50 ug via INTRAVENOUS

## 2017-12-01 MED ORDER — ONDANSETRON HCL 4 MG/2ML IJ SOLN
INTRAMUSCULAR | Status: AC
  Filled 2017-12-01: qty 2

## 2017-12-01 MED ORDER — LIDOCAINE HCL (CARDIAC) 20 MG/ML IV SOLN
INTRAVENOUS | Status: AC
  Filled 2017-12-01: qty 5

## 2017-12-01 MED ORDER — LIDOCAINE 2% (20 MG/ML) 5 ML SYRINGE
INTRAMUSCULAR | Status: DC | PRN
  Administered 2017-12-01: 100 mg via INTRAVENOUS

## 2017-12-01 MED ORDER — THROMBIN 5000 UNITS EX SOLR
CUTANEOUS | Status: DC | PRN
  Administered 2017-12-01 (×2): 5000 [IU] via TOPICAL

## 2017-12-01 MED ORDER — ACETAMINOPHEN 325 MG PO TABS: 650.0000 mg | ORAL_TABLET | ORAL | Status: DC | PRN

## 2017-12-01 MED ORDER — ONDANSETRON HCL 4 MG PO TABS: 4.0000 mg | ORAL_TABLET | Freq: Four times a day (QID) | ORAL | Status: DC | PRN

## 2017-12-01 MED ORDER — CEFAZOLIN SODIUM-DEXTROSE 2-4 GM/100ML-% IV SOLN
2.0000 g | INTRAVENOUS | Status: AC
  Administered 2017-12-01: 2 g via INTRAVENOUS

## 2017-12-01 MED ORDER — THROMBIN 5000 UNITS EX SOLR
CUTANEOUS | Status: AC
  Filled 2017-12-01: qty 5000

## 2017-12-01 MED ORDER — PROPOFOL 10 MG/ML IV BOLUS
INTRAVENOUS | Status: DC | PRN
  Administered 2017-12-01: 170 mg via INTRAVENOUS

## 2017-12-01 MED ORDER — PHENOL 1.4 % MT LIQD: 1.0000 | OROMUCOSAL | Status: DC | PRN

## 2017-12-01 MED ORDER — HYDROMORPHONE HCL 1 MG/ML IJ SOLN
INTRAMUSCULAR | Status: AC
  Administered 2017-12-01: 0.5 mg via INTRAVENOUS
  Filled 2017-12-01: qty 1

## 2017-12-01 MED ORDER — MENTHOL 3 MG MT LOZG: 1.0000 | LOZENGE | OROMUCOSAL | Status: DC | PRN

## 2017-12-01 MED ORDER — LACTATED RINGERS IV SOLN
INTRAVENOUS | Status: DC | PRN
  Administered 2017-12-01 (×2): via INTRAVENOUS

## 2017-12-01 MED ORDER — THROMBIN 5000 UNITS EX SOLR
CUTANEOUS | Status: AC
  Filled 2017-12-01: qty 10000

## 2017-12-01 MED ORDER — HYDROCODONE-ACETAMINOPHEN 10-325 MG PO TABS
2.0000 | ORAL_TABLET | ORAL | Status: DC | PRN
  Administered 2017-12-01 – 2017-12-06 (×14): 2 via ORAL
  Filled 2017-12-01 (×15): qty 2

## 2017-12-01 MED ORDER — DEXMEDETOMIDINE HCL 200 MCG/2ML IV SOLN
INTRAVENOUS | Status: DC | PRN
  Administered 2017-12-01 (×4): 4 ug via INTRAVENOUS
  Administered 2017-12-01: 8 ug via INTRAVENOUS

## 2017-12-01 MED ORDER — OXYCODONE HCL 5 MG/5ML PO SOLN: 5.0000 mg | Freq: Once | ORAL | Status: DC | PRN

## 2017-12-01 MED ORDER — MIDAZOLAM HCL 2 MG/2ML IJ SOLN
INTRAMUSCULAR | Status: DC | PRN
  Administered 2017-12-01: 2 mg via INTRAVENOUS

## 2017-12-01 MED ORDER — SUGAMMADEX SODIUM 500 MG/5ML IV SOLN
INTRAVENOUS | Status: AC
  Filled 2017-12-01: qty 5

## 2017-12-01 MED ORDER — GABAPENTIN 300 MG PO CAPS
300.0000 mg | ORAL_CAPSULE | Freq: Three times a day (TID) | ORAL | Status: DC
  Administered 2017-12-01 – 2017-12-06 (×15): 300 mg via ORAL
  Filled 2017-12-01 (×15): qty 1

## 2017-12-01 MED ORDER — SODIUM CHLORIDE 0.9 % IV SOLN
250.0000 mL | INTRAVENOUS | Status: DC
  Administered 2017-12-04: 250 mL via INTRAVENOUS

## 2017-12-01 MED ORDER — ROCURONIUM BROMIDE 10 MG/ML (PF) SYRINGE
PREFILLED_SYRINGE | INTRAVENOUS | Status: AC
  Filled 2017-12-01: qty 10

## 2017-12-01 MED ORDER — THROMBIN (RECOMBINANT) 5000 UNITS EX SOLR
OROMUCOSAL | Status: DC | PRN
  Administered 2017-12-01: 11:00:00 via TOPICAL

## 2017-12-01 MED ORDER — HYDROMORPHONE HCL 1 MG/ML IJ SOLN
0.2500 mg | INTRAMUSCULAR | Status: DC | PRN
  Administered 2017-12-01 (×2): 0.5 mg via INTRAVENOUS

## 2017-12-01 MED ORDER — KETOROLAC TROMETHAMINE 15 MG/ML IJ SOLN
30.0000 mg | Freq: Four times a day (QID) | INTRAMUSCULAR | Status: AC
Start: 2017-12-01 — End: 2017-12-02
  Administered 2017-12-01 – 2017-12-02 (×4): 30 mg via INTRAVENOUS
  Filled 2017-12-01 (×4): qty 2

## 2017-12-01 MED ORDER — SUGAMMADEX SODIUM 500 MG/5ML IV SOLN
INTRAVENOUS | Status: DC | PRN
  Administered 2017-12-01: 250 mg via INTRAVENOUS

## 2017-12-01 MED ORDER — ONDANSETRON HCL 4 MG/2ML IJ SOLN
INTRAMUSCULAR | Status: DC | PRN
  Administered 2017-12-01: 4 mg via INTRAVENOUS

## 2017-12-01 MED ORDER — PROPOFOL 10 MG/ML IV BOLUS
INTRAVENOUS | Status: AC
  Filled 2017-12-01: qty 20

## 2017-12-01 MED ORDER — MEPERIDINE HCL 50 MG/ML IJ SOLN: 6.2500 mg | INTRAMUSCULAR | Status: DC | PRN

## 2017-12-01 MED ORDER — HEMOSTATIC AGENTS (NO CHARGE) OPTIME
TOPICAL | Status: DC | PRN
  Administered 2017-12-01 (×2): 1 via TOPICAL

## 2017-12-01 MED ORDER — BUPIVACAINE HCL (PF) 0.25 % IJ SOLN
INTRAMUSCULAR | Status: AC
  Filled 2017-12-01: qty 30

## 2017-12-01 MED ORDER — 0.9 % SODIUM CHLORIDE (POUR BTL) OPTIME
TOPICAL | Status: DC | PRN
  Administered 2017-12-01: 1000 mL

## 2017-12-01 MED ORDER — DEXAMETHASONE SODIUM PHOSPHATE 10 MG/ML IJ SOLN
INTRAMUSCULAR | Status: AC
  Filled 2017-12-01: qty 1

## 2017-12-01 MED ORDER — HYDROMORPHONE HCL 1 MG/ML IJ SOLN
1.0000 mg | INTRAMUSCULAR | Status: DC | PRN
  Administered 2017-12-01 – 2017-12-06 (×17): 1 mg via INTRAVENOUS
  Filled 2017-12-01 (×17): qty 1

## 2017-12-01 SURGICAL SUPPLY — 57 items
BAG DECANTER FOR FLEXI CONT (MISCELLANEOUS) ×2 IMPLANT
BENZOIN TINCTURE PRP APPL 2/3 (GAUZE/BANDAGES/DRESSINGS) ×2 IMPLANT
BLADE CLIPPER SURG (BLADE) ×2 IMPLANT
BUR CUTTER 7.0 ROUND (BURR) ×2 IMPLANT
CANISTER SUCT 3000ML PPV (MISCELLANEOUS) ×2 IMPLANT
CARTRIDGE OIL MAESTRO DRILL (MISCELLANEOUS) ×1 IMPLANT
DECANTER SPIKE VIAL GLASS SM (MISCELLANEOUS) ×2 IMPLANT
DERMABOND ADVANCED (GAUZE/BANDAGES/DRESSINGS) ×1
DERMABOND ADVANCED .7 DNX12 (GAUZE/BANDAGES/DRESSINGS) ×1 IMPLANT
DIFFUSER DRILL AIR PNEUMATIC (MISCELLANEOUS) ×2 IMPLANT
DRAIN SUBARACHNOID (WOUND CARE) ×2 IMPLANT
DRAPE HALF SHEET 40X57 (DRAPES) IMPLANT
DRAPE LAPAROTOMY 100X72X124 (DRAPES) ×2 IMPLANT
DRAPE MICROSCOPE LEICA (MISCELLANEOUS) ×2 IMPLANT
DRAPE SURG 17X23 STRL (DRAPES) ×4 IMPLANT
DRSG OPSITE 4X5.5 SM (GAUZE/BANDAGES/DRESSINGS) ×2 IMPLANT
DRSG OPSITE POSTOP 4X6 (GAUZE/BANDAGES/DRESSINGS) ×2 IMPLANT
DURAPREP 26ML APPLICATOR (WOUND CARE) ×2 IMPLANT
DURASEAL APPLICATOR TIP (TIP) ×2 IMPLANT
DURASEAL SPINE SEALANT 3ML (MISCELLANEOUS) ×2 IMPLANT
ELECT REM PT RETURN 9FT ADLT (ELECTROSURGICAL) ×2
ELECTRODE REM PT RTRN 9FT ADLT (ELECTROSURGICAL) ×1 IMPLANT
GAUZE SPONGE 4X4 12PLY STRL (GAUZE/BANDAGES/DRESSINGS) ×2 IMPLANT
GAUZE SPONGE 4X4 16PLY XRAY LF (GAUZE/BANDAGES/DRESSINGS) IMPLANT
GLOVE BIOGEL PI IND STRL 7.0 (GLOVE) ×4 IMPLANT
GLOVE BIOGEL PI IND STRL 8 (GLOVE) ×1 IMPLANT
GLOVE BIOGEL PI INDICATOR 7.0 (GLOVE) ×4
GLOVE BIOGEL PI INDICATOR 8 (GLOVE) ×1
GLOVE ECLIPSE 9.0 STRL (GLOVE) ×2 IMPLANT
GLOVE EXAM NITRILE LRG STRL (GLOVE) IMPLANT
GLOVE EXAM NITRILE XL STR (GLOVE) IMPLANT
GLOVE EXAM NITRILE XS STR PU (GLOVE) IMPLANT
GOWN STRL REUS W/ TWL LRG LVL3 (GOWN DISPOSABLE) IMPLANT
GOWN STRL REUS W/ TWL XL LVL3 (GOWN DISPOSABLE) ×2 IMPLANT
GOWN STRL REUS W/TWL 2XL LVL3 (GOWN DISPOSABLE) IMPLANT
GOWN STRL REUS W/TWL LRG LVL3 (GOWN DISPOSABLE)
GOWN STRL REUS W/TWL XL LVL3 (GOWN DISPOSABLE) ×2
HEMOSTAT POWDER SURGIFOAM 1G (HEMOSTASIS) ×2 IMPLANT
KIT BASIN OR (CUSTOM PROCEDURE TRAY) ×2 IMPLANT
KIT DRAIN CSF ACCUDRAIN (MISCELLANEOUS) ×2 IMPLANT
KIT ROOM TURNOVER OR (KITS) ×2 IMPLANT
NEEDLE HYPO 22GX1.5 SAFETY (NEEDLE) ×2 IMPLANT
NEEDLE SPNL 22GX3.5 QUINCKE BK (NEEDLE) ×2 IMPLANT
NS IRRIG 1000ML POUR BTL (IV SOLUTION) ×2 IMPLANT
OIL CARTRIDGE MAESTRO DRILL (MISCELLANEOUS) ×2
PACK LAMINECTOMY NEURO (CUSTOM PROCEDURE TRAY) ×2 IMPLANT
PAD ARMBOARD 7.5X6 YLW CONV (MISCELLANEOUS) ×6 IMPLANT
RUBBERBAND STERILE (MISCELLANEOUS) ×4 IMPLANT
SEALANT ADHERUS EXTEND TIP (MISCELLANEOUS) ×2 IMPLANT
SPONGE SURGIFOAM ABS GEL SZ50 (HEMOSTASIS) ×2 IMPLANT
STRIP CLOSURE SKIN 1/2X4 (GAUZE/BANDAGES/DRESSINGS) ×2 IMPLANT
SUT PROLENE 6 0 BV (SUTURE) ×2 IMPLANT
SUT VIC AB 2-0 CT1 18 (SUTURE) ×4 IMPLANT
SUT VIC AB 3-0 SH 8-18 (SUTURE) ×2 IMPLANT
TOWEL GREEN STERILE (TOWEL DISPOSABLE) ×2 IMPLANT
TOWEL GREEN STERILE FF (TOWEL DISPOSABLE) ×2 IMPLANT
WATER STERILE IRR 1000ML POUR (IV SOLUTION) ×2 IMPLANT

## 2017-12-01 NOTE — Anesthesia Procedure Notes (Signed)
Procedure Name: Intubation Date/Time: 12/01/2017 10:13 AM Performed by: Bryson Corona, CRNA Pre-anesthesia Checklist: Patient identified, Emergency Drugs available, Suction available and Patient being monitored Patient Re-evaluated:Patient Re-evaluated prior to induction Oxygen Delivery Method: Circle System Utilized Preoxygenation: Pre-oxygenation with 100% oxygen Induction Type: IV induction Ventilation: Mask ventilation without difficulty Laryngoscope Size: Mac and 4 Grade View: Grade II Tube type: Oral Tube size: 7.0 mm Number of attempts: 1 Airway Equipment and Method: Stylet and Oral airway Placement Confirmation: ETT inserted through vocal cords under direct vision,  positive ETCO2 and breath sounds checked- equal and bilateral Secured at: 22 cm Tube secured with: Tape Dental Injury: Teeth and Oropharynx as per pre-operative assessment

## 2017-12-01 NOTE — Transfer of Care (Signed)
Immediate Anesthesia Transfer of Care Note  Patient: Jay Montgomery  Procedure(s) Performed: REPAIR OF CEREBROSPINAL FLUID LEAK and Placement of Lumbar Drain (N/A Back)  Patient Location: PACU  Anesthesia Type:General  Level of Consciousness: drowsy  Airway & Oxygen Therapy: Patient Spontanous Breathing and Patient connected to nasal cannula oxygen  Post-op Assessment: Report given to RN and Post -op Vital signs reviewed and unstable, Anesthesiologist notified  Post vital signs: Reviewed and stable  Last Vitals:  Vitals:   12/01/17 0640  BP: (!) 153/99  Pulse: 75  Resp: 20  Temp: (!) 36.4 C  SpO2: 99%    Last Pain:  Vitals:   12/01/17 0647  TempSrc:   PainSc: 7       Patients Stated Pain Goal: 4 (86/76/19 5093)  Complications: No apparent anesthesia complications

## 2017-12-01 NOTE — Brief Op Note (Signed)
12/01/2017  11:37 AM  PATIENT:  Glena Norfolk Lehrmann  32 y.o. male  PRE-OPERATIVE DIAGNOSIS:  Pseudomeningocele  POST-OPERATIVE DIAGNOSIS:  Pseudomeningocele  PROCEDURE:  Procedure(s): REPAIR OF CEREBROSPINAL FLUID LEAK and Placement of Lumbar Drain (N/A)  SURGEON:  Surgeon(s) and Role:    * Earnie Larsson, MD - Primary  PHYSICIAN ASSISTANT:   ASSISTANTS: none   ANESTHESIA:   general  EBL:  50 mL   BLOOD ADMINISTERED:none  DRAINS: Lumbar subarachnoid catheter hooked to closed drainage system   LOCAL MEDICATIONS USED:  MARCAINE     SPECIMEN:  No Specimen  DISPOSITION OF SPECIMEN:  N/A  COUNTS:  YES  TOURNIQUET:  * No tourniquets in log *  DICTATION: .Dragon Dictation  PLAN OF CARE: Admit to inpatient   PATIENT DISPOSITION:  PACU - hemodynamically stable.   Delay start of Pharmacological VTE agent (>24hrs) due to surgical blood loss or risk of bleeding: yes

## 2017-12-01 NOTE — Anesthesia Preprocedure Evaluation (Signed)
Anesthesia Evaluation  Patient identified by MRN, date of birth, ID band Patient awake    Reviewed: Allergy & Precautions, NPO status , Patient's Chart, lab work & pertinent test results  Airway Mallampati: II  TM Distance: >3 FB Neck ROM: Full    Dental no notable dental hx.    Pulmonary neg pulmonary ROS, former smoker,    Pulmonary exam normal breath sounds clear to auscultation       Cardiovascular negative cardio ROS Normal cardiovascular exam Rhythm:Regular Rate:Normal     Neuro/Psych negative neurological ROS  negative psych ROS   GI/Hepatic negative GI ROS, Neg liver ROS,   Endo/Other  negative endocrine ROS  Renal/GU negative Renal ROS  negative genitourinary   Musculoskeletal negative musculoskeletal ROS (+)   Abdominal   Peds negative pediatric ROS (+)  Hematology negative hematology ROS (+)   Anesthesia Other Findings   Reproductive/Obstetrics negative OB ROS                             Anesthesia Physical Anesthesia Plan  ASA: II  Anesthesia Plan: General   Post-op Pain Management:    Induction: Intravenous  PONV Risk Score and Plan: 2 and Ondansetron and Midazolam  Airway Management Planned: Oral ETT  Additional Equipment:   Intra-op Plan:   Post-operative Plan: Extubation in OR  Informed Consent: I have reviewed the patients History and Physical, chart, labs and discussed the procedure including the risks, benefits and alternatives for the proposed anesthesia with the patient or authorized representative who has indicated his/her understanding and acceptance.   Dental advisory given  Plan Discussed with: CRNA  Anesthesia Plan Comments:         Anesthesia Quick Evaluation

## 2017-12-01 NOTE — H&P (Signed)
Jay Montgomery is an 32 y.o. male.   Chief Complaint: Back swelling HPI: 32 year old male status post L4-5 decompressive laminectomy by Dr. Cyndy Freeze in November of this past year.  Patient presents back with worsening lower back swelling consistent with a pseudomeningocele.  MRI scanning confirms a subcutaneous CSF collection which is compressing the dorsal aspect of his thecal sac and causing symptoms.  Patient presents now for placement of lumbar drain and open repair of his CSF fistula.  Past Medical History:  Diagnosis Date  . Chicken pox   . Headache    due to CSF leak  . Heart murmur    "as a child"  . Spinal stenosis     Past Surgical History:  Procedure Laterality Date  . ADENOIDECTOMY    . BACK SURGERY  08/17/2017   L4-L5  . TONSILLECTOMY      Family History  Problem Relation Age of Onset  . Arthritis Mother   . Hypertension Mother   . COPD Mother   . Emphysema Mother   . Arthritis Father   . Diabetes Father    Social History:  reports that he quit smoking about 2 months ago. His smoking use included cigarettes. He smoked 0.50 packs per day. he has never used smokeless tobacco. He reports that he drinks alcohol. He reports that he does not use drugs.  Allergies: No Known Allergies  Medications Prior to Admission  Medication Sig Dispense Refill  . gabapentin (NEURONTIN) 300 MG capsule Take 300 mg by mouth 3 (three) times daily.    . methocarbamol (ROBAXIN) 750 MG tablet Take 750 mg by mouth every 6 (six) hours as needed for muscle spasms.    Marland Kitchen ibuprofen (ADVIL,MOTRIN) 200 MG tablet Take 400-600 mg by mouth every 8 (eight) hours as needed (FOR PAIN.).      Results for orders placed or performed during the hospital encounter of 11/30/17 (from the past 48 hour(s))  Surgical pcr screen     Status: None   Collection Time: 11/30/17  3:22 PM  Result Value Ref Range   MRSA, PCR NEGATIVE NEGATIVE   Staphylococcus aureus NEGATIVE NEGATIVE    Comment: (NOTE) The Xpert  SA Assay (FDA approved for NASAL specimens in patients 58 years of age and older), is one component of a comprehensive surveillance program. It is not intended to diagnose infection nor to guide or monitor treatment. Performed at [facility], [place] [address] [place], [place] [address]   Basic metabolic panel     Status: Abnormal   Collection Time: 11/30/17  3:23 PM  Result Value Ref Range   Sodium 140 135 - 145 mmol/L   Potassium 4.2 3.5 - 5.1 mmol/L   Chloride 107 101 - 111 mmol/L   CO2 24 22 - 32 mmol/L   Glucose, Bld 109 (H) 65 - 99 mg/dL   BUN 14 6 - 20 mg/dL   Creatinine, Ser 1.17 0.61 - 1.24 mg/dL   Calcium 9.4 8.9 - 10.3 mg/dL   GFR calc non Af Amer >60 >60 mL/min   GFR calc Af Amer >60 >60 mL/min    Comment: (NOTE) The eGFR has been calculated using the CKD EPI equation. This calculation has not been validated in all clinical situations. eGFR's persistently <60 mL/min signify possible Chronic Kidney Disease.    Anion gap 9 5 - 15    Comment: Performed at [facility] [address], [place], [place] [address]  CBC WITH DIFFERENTIAL  Status: None   Collection Time: 11/30/17  3:23 PM  Result Value Ref Range   WBC 7.2 4.0 - 10.5 K/uL   RBC 5.22 4.22 - 5.81 MIL/uL   Hemoglobin 15.9 13.0 - 17.0 g/dL   HCT 46.9 39.0 - 52.0 %   MCV 89.8 78.0 - 100.0 fL   MCH 30.5 26.0 - 34.0 pg   MCHC 33.9 30.0 - 36.0 g/dL   RDW 12.9 11.5 - 15.5 %   Platelets 257 150 - 400 K/uL   Neutrophils Relative % 61 %   Neutro Abs 4.5 1.7 - 7.7 K/uL   Lymphocytes Relative 29 %   Lymphs Abs 2.1 0.7 - 4.0 K/uL   Monocytes Relative 7 %   Monocytes Absolute 0.5 0.1 - 1.0 K/uL   Eosinophils Relative 2 %   Eosinophils Absolute 0.1 0.0 - 0.7 K/uL   Basophils Relative 1 %   Basophils Absolute 0.0 0.0 - 0.1 K/uL    Comment: Performed at [facility] [address], [place], [place] [address]   No results found.  Pertinent items noted in HPI and remainder of comprehensive ROS  otherwise negative.  Blood pressure (!) 153/99, pulse 75, temperature (!) 97.5 F (36.4 C), temperature source Oral, resp. rate 20, height '6\' 2"'  (1.88 m), weight 112.5 kg (248 lb), SpO2 99 %.  Patient is awake and alert.  Oriented and appropriate.  Cranial nerve function intact.  Motor and sensory function extremities normal.  Straight raising positive bilaterally.  Motor examination intact bilateral.  Sensory examination normal.  Examination of his lumbar wound demonstrates evidence of a subcutaneous fluid collection which with palpation causes pain to radiate down both lower extremities.  Examination head ears eyes nose throat is unremarkable her chest and abdomen are benign.  Extremities free from injury deformity. 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  Plan placement of lumbar drain and open repair of CSF fistula.  Risks and benefits of been explained.  Patient wishes to proceed.  Mallie Mussel A Lorenzo Arscott 12/01/2017, 9:48 AM

## 2017-12-01 NOTE — Op Note (Signed)
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  Asst. Surgeon: None  Anesthesia: General  Indication: 32 year old male status post previous L4-5 decompressive laminectomy by Dr. Cyndy Freeze in November.  Patient presents with wound swelling and increased pain.  Workup consistent with pseudomeningocele from CSF fistula.  Patient presents now for repair.  Operative note: After induction of anesthesia, patient position prone on the Wilson frame and properly padded.  Lumbar region prepped and draped sterilely.  A 14-gauge Touhy needle was then introduced into the L23 interspace with good return of clear CSF.  A lumbar subarachnoid catheter was then passed into the lumbar subarachnoid space.  The catheter was noted to be draining CSF well.  This is then tunneled and exited through a separate incision.  The lumbar wound was then reopened using a 10 blade.  Dissection then proceeded down into the associated mucocele.  Fluid was evacuated and dissection then proceeded down to the laminectomy bed.  Previous epidural scar was resected.  There was a small area of dural laceration in the midline above the lamina of L5.  Microscope brought in field used for microdissection of the spinal canal.  The dural laceration was oversewn using a 6-0 Prolene in a watertight fashion.  DuraSeal fibrin glue sealant was then placed over the dural repair.  Wounds and closed in layers with Vicryl sutures.  Steri-Strips and sterile dressing were applied.  No apparent complications.  Patient tolerated the procedure well and he returns to the recovery room postop.

## 2017-12-02 ENCOUNTER — Encounter (HOSPITAL_COMMUNITY): Payer: Self-pay | Admitting: Neurosurgery

## 2017-12-02 MED FILL — Thrombin For Soln 5000 Unit: CUTANEOUS | Qty: 5000 | Status: AC

## 2017-12-02 NOTE — Progress Notes (Signed)
Postop day 1.  Overall patient's lower extremities feel better.  Less numbness.  No weakness.  Tolerating the lumbar drain well with no significant headache.  Awake and alert.  Oriented and appropriate.  Motor and sensory function intact.  Abdomen soft.  CSF clear.  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  Continue bedrest and lumbar drainage.  Plan to continue lumbar drainage through Monday at which point a lumbar drain will be removed and the patient will be mobilized.  Dr. Saintclair Halsted will cover for me in my absence this next week.

## 2017-12-02 NOTE — Anesthesia Postprocedure Evaluation (Signed)
Anesthesia Post Note  Patient: Jay Montgomery  Procedure(s) Performed: REPAIR OF CEREBROSPINAL FLUID LEAK and Placement of Lumbar Drain (N/A Back)     Patient location during evaluation: PACU Anesthesia Type: General Level of consciousness: awake and alert Pain management: pain level controlled Vital Signs Assessment: post-procedure vital signs reviewed and stable Respiratory status: spontaneous breathing, nonlabored ventilation and respiratory function stable Cardiovascular status: blood pressure returned to baseline and stable Postop Assessment: no apparent nausea or vomiting Anesthetic complications: no    Last Vitals:  Vitals:   12/02/17 0500 12/02/17 0600  BP: (!) 118/107 110/67  Pulse: 74 (!) 59  Resp: 17 13  Temp:    SpO2: 92% 95%    Last Pain:  Vitals:   12/02/17 0400  TempSrc: Oral  PainSc: Asleep                 Lynda [place]

## 2017-12-03 NOTE — Progress Notes (Signed)
Subjective: Patient reports doing well little bit of a headache but manageable  Objective: Vital signs in last 24 hours: Temp:  [97.7 F (36.5 C)-98.3 F (36.8 C)] 98 F (36.7 C) (03/16 0747) Pulse Rate:  [67-107] 73 (03/16 0700) Resp:  [13-22] 17 (03/16 0700) BP: (107-155)/(62-106) 149/106 (03/16 0700) SpO2:  [92 %-99 %] 92 % (03/16 0700)  Intake/Output from previous day: 03/15 0701 - 03/16 0700 In: 1443 [P.O.:1340; I.V.:3; IV Piggyback:100] Out: 854 [Urine:700; Drains:154] Intake/Output this shift: No intake/output data recorded.  awake alert oriented strength 5 out of 5 wound clean dry and intact  Lab Results: Recent Labs    11/30/17 1523  WBC 7.2  HGB 15.9  HCT 46.9  PLT 257   BMET Recent Labs    11/30/17 1523  NA 140  K 4.2  CL 107  CO2 24  GLUCOSE 109*  BUN 14  CREATININE 1.17  CALCIUM 9.4    Studies/Results: No results found.  Assessment/Plan: Postoperative day to revision and repair CSF leak with lumbar drain placement. Drains seems to be functioning well patient has minimal headache dressings dry. Continue drainage for 4-5 days  LOS: 2 days     Montserrat Shek P 12/03/2017, 7:51 AM

## 2017-12-04 MED ORDER — SENNA 8.6 MG PO TABS
1.0000 | ORAL_TABLET | Freq: Every day | ORAL | Status: DC
  Administered 2017-12-04 – 2017-12-06 (×3): 8.6 mg via ORAL
  Filled 2017-12-04 (×3): qty 1

## 2017-12-04 MED ORDER — DOCUSATE SODIUM 100 MG PO CAPS
100.0000 mg | ORAL_CAPSULE | Freq: Every day | ORAL | Status: DC
  Administered 2017-12-04 – 2017-12-06 (×3): 100 mg via ORAL
  Filled 2017-12-04 (×3): qty 1

## 2017-12-04 NOTE — Progress Notes (Signed)
Subjective: Patient reports patient well slight headache no leg pain  Objective: Vital signs in last 24 hours: Temp:  [97.9 F (36.6 C)-98.8 F (37.1 C)] 98.1 F (36.7 C) (03/17 0400) Pulse Rate:  [60-109] 91 (03/17 0500) Resp:  [12-22] 17 (03/17 0500) BP: (123-149)/(72-107) 123/90 (03/17 0500) SpO2:  [89 %-98 %] 91 % (03/17 0500) FiO2 (%):  [3 %] 3 % (03/17 0400)  Intake/Output from previous day: 03/16 0701 - 03/17 0700 In: 200 [IV Piggyback:200] Out: 1818 [Urine:1700; Drains:118] Intake/Output this shift: Total I/O In: 50 [IV Piggyback:50] Out: 1021 [Urine:975; Drains:46]  strength 5 out of 5 wound clean dry and intact  Lab Results: No results for input(s): WBC, HGB, HCT, PLT in the last 72 hours. BMET No results for input(s): NA, K, CL, CO2, GLUCOSE, BUN, CREATININE, CALCIUM in the last 72 hours.  Studies/Results: No results found.  Assessment/Plan: Postop day3 from primary repair of CSF leak of lumbar drain placement. Patient doing well drain functioning well continue drainage will more day DC the drain tomorrow.  LOS: 3 days     Dhruv Christina P 12/04/2017, 5:52 AM

## 2017-12-05 NOTE — Progress Notes (Signed)
Subjective: Patient reports  Mild headache but has not gotten any worse.  Objective: Vital signs in last 24 hours: Temp:  [98 F (36.7 C)-98.9 F (37.2 C)] 98.1 F (36.7 C) (03/18 0800) Pulse Rate:  [67-127] 91 (03/18 0900) Resp:  [11-25] 25 (03/18 0900) BP: (116-146)/(62-99) 124/83 (03/18 0900) SpO2:  [95 %-99 %] 99 % (03/18 0900)  Intake/Output from previous day: 03/17 0701 - 03/18 0700 In: 383 [I.V.:233; IV Piggyback:150] Out: 4008 [Urine:3850; Drains:158] Intake/Output this shift: Total I/O In: 30 [I.V.:30] Out: 331 [Urine:300; Drains:31]  Neurologic: Grossly normal  Lab Results: Lab Results  Component Value Date   WBC 7.2 11/30/2017   HGB 15.9 11/30/2017   HCT 46.9 11/30/2017   MCV 89.8 11/30/2017   PLT 257 11/30/2017   No results found for: INR, PROTIME BMET Lab Results  Component Value Date   NA 140 11/30/2017   K 4.2 11/30/2017   CL 107 11/30/2017   CO2 24 11/30/2017   GLUCOSE 109 (H) 11/30/2017   BUN 14 11/30/2017   CREATININE 1.17 11/30/2017   CALCIUM 9.4 11/30/2017    Studies/Results: No results found.  Assessment/Plan: DC'd lumbar drain. Continue to lay flat today. Will slowly raise him up tomorrow   LOS: 4 days    Ocie Cornfield [facility] 12/05/2017, 10:39 AM

## 2017-12-05 NOTE — Plan of Care (Signed)
PRN pain meds helping w/ appropriate pain management for pt.

## 2017-12-06 MED ORDER — HYDROCODONE-ACETAMINOPHEN 10-325 MG PO TABS: 2.0000 | ORAL_TABLET | ORAL | 0 refills | Status: DC | PRN

## 2017-12-06 NOTE — Progress Notes (Signed)
Patient d/c instructions given to patient , questions answered. IV removed. Patient taken to car via wheelchair.   MD office paged 1250 for prescription to be signed.  Office called again 1400 for same. MD office sent prescription electronically to pharmacy.

## 2017-12-06 NOTE — Progress Notes (Signed)
1900: Handoff report received from RN. Pt resting in bed with girlfriend at bedside; pt c/o 8/10 pain in back. Discussed plan of care for the shift; pt amenable to plan.  0000: Pt continues with poor pain control per subjective report; no objective s/s. Awaiting IV abx from Rx.  0200: Pt resting comfortably.  0400: Pt continues resting comfortably.  0700: Handoff report given to RN. No acute events overnight.

## 2017-12-06 NOTE — Progress Notes (Signed)
Patient ID: Jay Montgomery, male   DOB: [birth year], 32 y.o.   MRN: [id number] Doing well some headache  Strength out of 5 wound clean dry and intact  Mobilized today

## 2017-12-06 NOTE — Discharge Summary (Signed)
  Physician Discharge Summary  Patient ID: Jay Montgomery MRN: [id number] DOB/AGE: [birth year] 32 y.o. Estimated body mass index is 31.84 kg/m as calculated from the following:   Height as of this encounter: 6\' 2"  (1.88 m).   Weight as of this encounter: 112.5 kg (248 lb).   Admit date: 12/01/2017 Discharge date: 12/06/2017  Admission Diagnoses:CSF leak  Discharge Diagnoses: same Active Problems:   Postoperative CSF leak   Discharged Condition: good  Hospital Course: patient was admitted underwent primary CSF leak requiring placement of lumbar drain. Drain was continued for 4 days wound remained dry. Drain was discontinued patient was observed another 24 hours was mobilized and no active drainage. Patient stable for discharge home.  Consults: Significant Diagnostic Studies: Treatments:exploration of lumbar wound for repair of CSF leak with placement of lumbar drain Discharge Exam: Blood pressure (!) 135/92, pulse 93, temperature 98.2 F (36.8 C), temperature source Oral, resp. rate 18, height 6\' 2"  (1.88 m), weight 112.5 kg (248 lb), SpO2 95 %. Strength out of 5 wound clean dry and intact  Disposition: home   Allergies as of 12/06/2017   No Known Allergies     Medication List    TAKE these medications   gabapentin 300 MG capsule Commonly known as:  NEURONTIN Take 300 mg by mouth 3 (three) times daily.   HYDROcodone-acetaminophen 10-325 MG tablet Commonly known as:  NORCO Take 2 tablets by mouth every 4 (four) hours as needed for severe pain ((score 7 to 10)).   ibuprofen 200 MG tablet Commonly known as:  ADVIL,MOTRIN Take 400-600 mg by mouth every 8 (eight) hours as needed (FOR PAIN.).   methocarbamol 750 MG tablet Commonly known as:  ROBAXIN Take 750 mg by mouth every 6 (six) hours as needed for muscle spasms.        Signed: Ross Hefferan P 12/06/2017, 12:10 PM

## 2019-01-23 ENCOUNTER — Other Ambulatory Visit: Payer: Self-pay

## 2019-01-23 ENCOUNTER — Emergency Department (HOSPITAL_COMMUNITY)
Admission: EM | Admit: 2019-01-23 | Discharge: 2019-01-23 | Disposition: A | Discharge status: Home / Self Care | Payer: Managed Care, Other (non HMO) | Attending: Emergency Medicine | Admitting: Emergency Medicine

## 2019-01-23 ENCOUNTER — Emergency Department (HOSPITAL_COMMUNITY): Payer: Managed Care, Other (non HMO)

## 2019-01-23 ENCOUNTER — Encounter (HOSPITAL_COMMUNITY): Payer: Self-pay

## 2019-01-23 DIAGNOSIS — Z87891 Personal history of nicotine dependence: Secondary | ICD-10-CM | POA: Insufficient documentation

## 2019-01-23 DIAGNOSIS — W010XXA Fall on same level from slipping, tripping and stumbling without subsequent striking against object, initial encounter: Secondary | ICD-10-CM | POA: Insufficient documentation

## 2019-01-23 DIAGNOSIS — Y92003 Bedroom of unspecified non-institutional (private) residence as the place of occurrence of the external cause: Secondary | ICD-10-CM | POA: Diagnosis not present

## 2019-01-23 DIAGNOSIS — Y9389 Activity, other specified: Secondary | ICD-10-CM | POA: Diagnosis not present

## 2019-01-23 DIAGNOSIS — S0990XA Unspecified injury of head, initial encounter: Secondary | ICD-10-CM | POA: Diagnosis not present

## 2019-01-23 DIAGNOSIS — R55 Syncope and collapse: Secondary | ICD-10-CM

## 2019-01-23 DIAGNOSIS — Z79899 Other long term (current) drug therapy: Secondary | ICD-10-CM | POA: Diagnosis not present

## 2019-01-23 DIAGNOSIS — Y999 Unspecified external cause status: Secondary | ICD-10-CM | POA: Diagnosis not present

## 2019-01-23 DIAGNOSIS — H9319 Tinnitus, unspecified ear: Secondary | ICD-10-CM

## 2019-01-23 DIAGNOSIS — F419 Anxiety disorder, unspecified: Secondary | ICD-10-CM

## 2019-01-23 DIAGNOSIS — D229 Melanocytic nevi, unspecified: Secondary | ICD-10-CM | POA: Diagnosis not present

## 2019-01-23 LAB — BASIC METABOLIC PANEL
Anion gap: 7 (ref 5–15)
BUN: 11 mg/dL (ref 6–20)
CO2: 26 mmol/L (ref 22–32)
Calcium: 9.6 mg/dL (ref 8.9–10.3)
Chloride: 104 mmol/L (ref 98–111)
Creatinine, Ser: 1.05 mg/dL (ref 0.61–1.24)
GFR calc Af Amer: >60 mL/min (ref >60)
GFR calc non Af Amer: >60 mL/min (ref >60)
Glucose, Bld: 107 mg/dL — ABNORMAL HIGH (ref 70–99)
Potassium: 3.8 mmol/L (ref 3.5–5.1)
Sodium: 137 mmol/L (ref 135–145)

## 2019-01-23 LAB — URINALYSIS, ROUTINE W REFLEX MICROSCOPIC
Bilirubin Urine: NEGATIVE
Glucose, UA: NEGATIVE mg/dL
Hgb urine dipstick: NEGATIVE
Ketones, ur: NEGATIVE mg/dL
Leukocytes,Ua: NEGATIVE
Nitrite: NEGATIVE
Protein, ur: NEGATIVE mg/dL
Specific Gravity, Urine: 1.005 (ref 1.005–1.030)
pH: 7 (ref 5.0–8.0)

## 2019-01-23 LAB — CBC
HCT: 48.6 % (ref 39.0–52.0)
Hemoglobin: 16.1 g/dL (ref 13.0–17.0)
MCH: 29.7 pg (ref 26.0–34.0)
MCHC: 33.1 g/dL (ref 30.0–36.0)
MCV: 89.7 fL (ref 80.0–100.0)
Platelets: 218 10*3/uL (ref 150–400)
RBC: 5.42 MIL/uL (ref 4.22–5.81)
RDW: 13 % (ref 11.5–15.5)
WBC: 11.2 10*3/uL — ABNORMAL HIGH (ref 4.0–10.5)
nRBC: 0 % (ref 0.0–0.2)

## 2019-01-23 LAB — CBG MONITORING, ED: Glucose-Capillary: 76 mg/dL (ref 70–99)

## 2019-01-23 MED ORDER — SODIUM CHLORIDE 0.9% FLUSH: 3.0000 mL | Freq: Once | INTRAVENOUS | Status: DC

## 2019-01-23 MED ORDER — HYDROXYZINE HCL 25 MG PO TABS: 25.0000 mg | ORAL_TABLET | Freq: Four times a day (QID) | ORAL | 0 refills | Status: DC | PRN

## 2019-01-23 MED ORDER — SODIUM CHLORIDE 0.9 % IV BOLUS
1000.0000 mL | Freq: Once | INTRAVENOUS | Status: AC
  Administered 2019-01-23: 1000 mL via INTRAVENOUS

## 2019-01-23 NOTE — ED Triage Notes (Signed)
Pt states he stood up and passed out this morning, hit his head on the floor. Pt states his heart was racing.

## 2019-01-23 NOTE — ED Notes (Signed)
Patient returned from CT

## 2019-01-23 NOTE — ED Provider Notes (Signed)
Pt signed out by Dr. Eulis Foster pending labs.  Pt is feeling a little better after IVFs, but still a little dizzy.  He also tells me that he has some tinnitus. He denies taking ASA.  He has been working night shift, and has 4 kids at home due to [employer].  He gets very little sleep.  He has been having trouble with anxiety. Labs wnl.  Pt will be d/c home with prn atarax.  He is instructed to f/u with ENT if tinnitus worsens or does not resolve.  He is given head injury instructions.  He is encouraged to try to sleep more and to drink more water.  Return if worse.   Isla Pence, MD 01/23/19 [phone]

## 2019-01-23 NOTE — ED Notes (Signed)
Patient transported to CT 

## 2019-01-23 NOTE — ED Provider Notes (Signed)
[facility] EMERGENCY DEPARTMENT Provider Note   CSN: [id number] Arrival date & time: 01/23/19  1728    History   Chief Complaint Chief Complaint  Patient presents with  . Near Syncope    HPI Jay Montgomery is a 33 y.o. male.    Patient was getting out of bed to go to work around 3 PM, when he felt dizzy and fell forward.  He remembers staggering then awoke with his wife arousing him.  He was "groggy for a while," then able to get up and walk.  He came here by private vehicle for evaluation.  Currently complains of headache.  He denies nausea, vomiting, blurred vision, weakness, paresthesias, neck pain, back pain or extremity pain.  No prior similar problems.  There are no other known modifying factors.  Past Medical History:  Diagnosis Date  . Chicken pox   . Headache    due to CSF leak  . Heart murmur    "as a child"  . Spinal stenosis     Patient Active Problem List   Diagnosis Date Noted  . Postoperative CSF leak 12/01/2017  . Spinal stenosis of lumbar region 02/15/2017    Past Surgical History:  Procedure Laterality Date  . ADENOIDECTOMY    . BACK SURGERY  08/17/2017   L4-L5  . LUMBAR LAMINECTOMY/DECOMPRESSION MICRODISCECTOMY N/A 12/01/2017   Procedure: REPAIR OF CEREBROSPINAL FLUID LEAK and Placement of Lumbar Drain;  Surgeon: Earnie Larsson, MD;  Location: [place];  Service: Neurosurgery;  Laterality: N/A;  . TONSILLECTOMY          Home Medications    Prior to Admission medications   Medication Sig Start Date End Date Taking? Authorizing Provider  gabapentin (NEURONTIN) 300 MG capsule Take 300 mg by mouth 3 (three) times daily.    [provider]  HYDROcodone-acetaminophen (NORCO) 10-325 MG tablet Take 2 tablets by mouth every 4 (four) hours as needed for severe pain ((score 7 to 10)). 12/06/17   Kary Kos, MD  ibuprofen (ADVIL,MOTRIN) 200 MG tablet Take 400-600 mg by mouth every 8 (eight) hours as needed (FOR PAIN.).    [provider]   methocarbamol (ROBAXIN) 750 MG tablet Take 750 mg by mouth every 6 (six) hours as needed for muscle spasms.    [provider]    Family History Family History  Problem Relation Age of Onset  . Arthritis Mother   . Hypertension Mother   . COPD Mother   . Emphysema Mother   . Arthritis Father   . Diabetes Father     Social History Social History   Tobacco Use  . Smoking status: Former Smoker    Packs/day: 0.50    Types: Cigarettes    Last attempt to quit: 09/12/2017    Years since quitting: 1.3  . Smokeless tobacco: Never Used  Substance Use Topics  . Alcohol use: Not Currently    Comment: occassional  . Drug use: No     Allergies   Vicodin [hydrocodone-acetaminophen]   Review of Systems Review of Systems   Physical Exam Updated Vital Signs BP (!) 122/91   Pulse 83   Temp 97.9 F (36.6 C) (Oral)   Resp 20   Ht 6\' 3"  (1.905 m)   Wt 129.3 kg   SpO2 99%   BMI 35.62 kg/m   Physical Exam Vitals signs and nursing note reviewed.  Constitutional:      General: He is not in acute distress.    Appearance: He is  well-developed. He is not ill-appearing, toxic-appearing or diaphoretic.  HENT:     Head: Normocephalic.     Comments: Tender left frontal temporal area, without significant swelling, abrasion, laceration or bruising.  No blood in auditory canals.    Right Ear: External ear normal.     Left Ear: External ear normal.     Nose: No congestion or rhinorrhea.     Mouth/Throat:     Mouth: Mucous membranes are moist.     Pharynx: No oropharyngeal exudate or posterior oropharyngeal erythema.  Eyes:     Conjunctiva/sclera: Conjunctivae normal.     Pupils: Pupils are equal, round, and reactive to light.  Neck:     Musculoskeletal: Normal range of motion and neck supple.     Trachea: Phonation normal.  Cardiovascular:     Rate and Rhythm: Normal rate and regular rhythm.     Heart sounds: Normal heart sounds.  Pulmonary:     Effort: Pulmonary  effort is normal.     Breath sounds: Normal breath sounds.  Abdominal:     Palpations: Abdomen is soft.     Tenderness: There is no abdominal tenderness.  Musculoskeletal: Normal range of motion.  Skin:    General: Skin is warm and dry.     Comments: He asked me to look at a skin lesion on his back.  Left posterior shoulder region has a 3 mm benign appearing mole with some hair growing out of it.  He is concerned that has been present for a while and would like to get it removed.  Neurological:     Mental Status: He is alert and oriented to person, place, and time.     Cranial Nerves: No cranial nerve deficit.     Sensory: No sensory deficit.     Motor: No abnormal muscle tone.     Coordination: Coordination normal.  Psychiatric:        Mood and Affect: Mood normal.        Behavior: Behavior normal.        Thought Content: Thought content normal.        Judgment: Judgment normal.      ED Treatments / Results  Labs (all labs ordered are listed, but only abnormal results are displayed) Labs Reviewed  BASIC METABOLIC PANEL  CBC  URINALYSIS, ROUTINE W REFLEX MICROSCOPIC  CBG MONITORING, ED    EKG None  Radiology No results found.  Procedures Procedures (including critical care time)  Medications Ordered in ED Medications  sodium chloride flush (NS) 0.9 % injection 3 mL (has no administration in time range)  sodium chloride 0.9 % bolus 1,000 mL (has no administration in time range)     Initial Impression / Assessment and Plan / ED Course  I have reviewed the triage vital signs and the nursing notes.  Pertinent labs & imaging results that were available during my care of the patient were reviewed by me and considered in my medical decision making (see chart for details).         Patient Vitals for the past 24 hrs:  BP Temp Temp src Pulse Resp SpO2 Height Weight  01/23/19 1749 (!) 122/91 97.9 F (36.6 C) Oral 83 20 99 % - -  01/23/19 1736 - - - - - - 6\' 3"   (1.905 m) 129.3 kg     Medical Decision Making: Syncope upon standing, after sleeping.  Likely vagal event.  Treatment for cardiopulmonary abnormalities and occult problems has been requested.  Patient has a benign mole, and has requested dermatology follow-up.  CRITICAL CARE-no Performed by: Daleen Bo   Nursing Notes Reviewed/ Care Coordinated Applicable Imaging Reviewed Interpretation of Laboratory Data incorporated into ED treatment  Plan-disposition by oncoming provider team following return of testing. \  Final Clinical Impressions(s) / ED Diagnoses   Final diagnoses:  Syncope and collapse  Injury of head, initial encounter  Benign skin mole    ED Discharge Orders    None       Daleen Bo, MD 01/23/19 1907

## 2019-01-24 ENCOUNTER — Telehealth: Payer: Self-pay | Admitting: Family Medicine

## 2019-01-24 NOTE — Telephone Encounter (Signed)
Are you ok with TOC of this pt to [facility] in [place]. GF states that [facility] will be closer.

## 2019-01-25 ENCOUNTER — Ambulatory Visit (INDEPENDENT_AMBULATORY_CARE_PROVIDER_SITE_OTHER): Payer: Managed Care, Other (non HMO) | Admitting: Physician Assistant

## 2019-01-25 ENCOUNTER — Encounter: Payer: Self-pay | Admitting: Physician Assistant

## 2019-01-25 ENCOUNTER — Other Ambulatory Visit: Payer: Self-pay

## 2019-01-25 VITALS — Ht 75.0 in | Wt 215.0 lb

## 2019-01-25 DIAGNOSIS — F411 Generalized anxiety disorder: Secondary | ICD-10-CM

## 2019-01-25 DIAGNOSIS — R55 Syncope and collapse: Secondary | ICD-10-CM | POA: Diagnosis not present

## 2019-01-25 MED ORDER — CITALOPRAM HYDROBROMIDE 10 MG PO TABS: 10.0000 mg | ORAL_TABLET | Freq: Every day | ORAL | 1 refills | Status: DC

## 2019-01-25 MED ORDER — ALPRAZOLAM 0.25 MG PO TABS: 0.2500 mg | ORAL_TABLET | Freq: Two times a day (BID) | ORAL | 0 refills | Status: DC | PRN

## 2019-01-25 NOTE — Progress Notes (Signed)
I have discussed the procedure for the virtual visit with the patient who has given consent to proceed with assessment and treatment.   Rebeka Kimble, CMA     

## 2019-01-25 NOTE — Patient Instructions (Signed)
Instructions sent to MyChart.   Please start the Citalopram, taking 1 tablet (10 mg) daily.  The Xanax is to use no more than as directed for severe acute anxiety only. Try to download the Calm app on your smart phone and try out some of these exercises to help with stress, anxiety and irritability.  Consider calling [place] at [phone] to set up a counseling appointment. We have fantastic counselors available to [place]. I would recommend Dr. Glennon Hamilton, Dr. Lurline Hare, Clint Bolder, Dr. Gaynell Face or Caroline Sauger.  Follow-up with me in 4 weeks. You can call the office to schedule video visit or we can do via MyChart if you prefer.   It was very nice meeting you today. Welcome to [facility]!

## 2019-01-25 NOTE — Progress Notes (Signed)
   Virtual Visit via Video   I connected with patient on 01/25/19 at 10:20 AM EDT by a video enabled telemedicine application and verified that I am speaking with the correct person using two identifiers.  Location patient: Home Location provider: [facility], Office Persons participating in the virtual visit: Patient, Provider, [place] ([place])  I discussed the limitations of evaluation and management by telemedicine and the availability of in person appointments. The patient expressed understanding and agreed to proceed.  Subjective:   HPI:   Patient presents via DOxy.me today as a transfer of care from Dr. Betty Martinique, for ER follow-up. Patient was seen in ER on 01/23/2019 with c/o syncopal episode (witnessed) occurring at home. ER workup included labs (negative UA, unremarkable BMP, Mild increase WBC at 11.2 (hemoconcentration likely)), CT Head (negative). Felt better with IV fluids. Syncopal episode thought to be vasovagal 2/2 anxiety, lack of sleep and mild dehydration. Was told to increase fluids, rest and follow-up with primary care for discussion of anxiety.   Since discharge, patient denies any recurrent syncopal episodes. Notes some episodes of lightheadedness when getting flustered. Notes chronic anxiety for most of his life but notes this has been worsening over the past several months. Notes being more irritable. Sometimes noting anhedonia without true depressed mood. Denies SI/HI.  ROS:   See pertinent positives and negatives per HPI.  Patient Active Problem List   Diagnosis Date Noted  . Postoperative CSF leak 12/01/2017  . Spinal stenosis of lumbar region 02/15/2017    Social History   Tobacco Use  . Smoking status: Former Smoker    Packs/day: 0.50    Types: Cigarettes    Last attempt to quit: 09/12/2017    Years since quitting: 1.3  . Smokeless tobacco: Never Used  Substance Use Topics  . Alcohol use: Not Currently    Comment: occassional   No  current outpatient medications on file.  Allergies  Allergen Reactions  . Hydroxyzine     Dizziness, mental fogginess, nausea  . Vicodin [Hydrocodone-Acetaminophen] Hives and Itching    Objective:   Ht 6\' 3"  (1.905 m)   Wt 215 lb (97.5 kg)   BMI 26.87 kg/m   Patient is well-developed, well-nourished in no acute distress.  Resting comfortably at home.  Head is normocephalic, atraumatic.  No labored breathing.  Speech is clear and coherent with logical content.  Patient is alert and oriented at baseline.   Assessment and Plan:   1. GAD (generalized anxiety disorder) Will start Citalopram 10 mg once daily over the next two weeks. If tolerating well without side effect, will plan on increasing to 20 mg at that time. Rx short-course of Xanax to act as a Data processing manager for acute anxiety. - citalopram (CELEXA) 10 MG tablet; Take 1 tablet (10 mg total) by mouth daily.  Dispense: 30 tablet; Refill: 1 - ALPRAZolam (XANAX) 0.25 MG tablet; Take 1 tablet (0.25 mg total) by mouth 2 (two) times daily as needed for anxiety.  Dispense: 5 tablet; Refill: 0  2. Vasovagal episode Working on diet, hydration and anxiety. Counseling recommended. He will give thought to this. Starting SSRI as noted above for anxiety. Continue increased fluid intake. No skipping meals. Follow-up scheduled. Strict ER precautions reviewed with patient.     [name], PA-C 01/25/2019

## 2019-01-26 ENCOUNTER — Encounter: Payer: Self-pay | Admitting: Physician Assistant

## 2019-01-26 NOTE — Telephone Encounter (Signed)
It is Ok. Thanks, BJ

## 2019-01-31 ENCOUNTER — Encounter: Payer: Self-pay | Admitting: Physician Assistant

## 2019-01-31 ENCOUNTER — Other Ambulatory Visit: Payer: Self-pay

## 2019-01-31 ENCOUNTER — Ambulatory Visit (INDEPENDENT_AMBULATORY_CARE_PROVIDER_SITE_OTHER): Payer: Managed Care, Other (non HMO) | Admitting: Physician Assistant

## 2019-01-31 DIAGNOSIS — F411 Generalized anxiety disorder: Secondary | ICD-10-CM | POA: Diagnosis not present

## 2019-01-31 MED ORDER — DIAZEPAM 2 MG PO TABS: 2.0000 mg | ORAL_TABLET | Freq: Two times a day (BID) | ORAL | 0 refills | Status: DC | PRN

## 2019-01-31 NOTE — Progress Notes (Signed)
I have discussed the procedure for the virtual visit with the patient who has given consent to proceed with assessment and treatment.   Riyah Bardon S Jannatul Wojdyla, CMA     

## 2019-01-31 NOTE — Progress Notes (Signed)
   Virtual Visit via Video   I connected with patient on 01/31/19 at 10:00 AM EDT by a video enabled telemedicine application and verified that I am speaking with the correct person using two identifiers.  Location patient: Home Location provider: [facility], Office Persons participating in the virtual visit: Patient, Provider, [place] ([place])  I discussed the limitations of evaluation and management by telemedicine and the availability of in person appointments. The patient expressed understanding and agreed to proceed.  Subjective:   HPI:   Patient presets today for 1 week follow-up of GAD with panic attack and vasovagal syncope. Patient endorses starting the Citalopram and taking daily as directed. Is tolerating well. Has noted some improvement in appetite. Still dealing with significant anxiety, having to take a Xanax every couple of days when he gets really overwhelmed. Notes one other episode of nausea with sweating and lightheadedness the other day. Took a Xanax and symptoms resolved rapidly. Is trying to hydrate more. Sleep is somewhat improved but still inconsistent.   ROS:   See pertinent positives and negatives per HPI.  Patient Active Problem List   Diagnosis Date Noted  . GAD (generalized anxiety disorder) 01/25/2019  . Postoperative CSF leak 12/01/2017  . Spinal stenosis of lumbar region 02/15/2017    Social History   Tobacco Use  . Smoking status: Former Smoker    Packs/day: 0.50    Types: Cigarettes    Last attempt to quit: 09/12/2017    Years since quitting: 1.3  . Smokeless tobacco: Never Used  Substance Use Topics  . Alcohol use: Not Currently    Comment: occassional    Current Outpatient Medications:  .  citalopram (CELEXA) 10 MG tablet, Take 1 tablet (10 mg total) by mouth daily., Disp: 30 tablet, Rfl: 1 .  diazepam (VALIUM) 2 MG tablet, Take 1 tablet (2 mg total) by mouth every 12 (twelve) hours as needed for anxiety., Disp: 60 tablet,  Rfl: 0  Allergies  Allergen Reactions  . Hydroxyzine     Dizziness, mental fogginess, nausea  . Vicodin [Hydrocodone-Acetaminophen] Hives and Itching    Objective:   There were no vitals taken for this visit.  Patient is well-developed, well-nourished in no acute distress.  Resting comfortably at home.  Head is normocephalic, atraumatic.  No labored breathing.  Speech is clear and coherent with logical content.  Patient is alert and oriented at baseline.   Assessment and Plan:    1. GAD (generalized anxiety disorder) Will have him continue Citalopram at current dose for a couple more days to get to total of 10 days on this dose. Then will increase to 20 mg daily. Stop Xanax and start Diazepam as longer acting to help more with anxiety until SSRI gets to therapeutic effect. In-office follow-up scheduled. Strict return sooner precautions reviewed with patient.   - diazepam (VALIUM) 2 MG tablet; Take 1 tablet (2 mg total) by mouth every 12 (twelve) hours as needed for anxiety.  Dispense: 60 tablet; Refill: 0    [name], [place] 01/31/2019

## 2019-01-31 NOTE — Patient Instructions (Signed)
Instructions sent to MyChart:  Please continue the 1 tablet of the Citalopram for a few more days, then increase to 2 tablets (20 mg) daily. Stop the Xanax. I have sent in Diazepam to take up to twice daily to help with acute anxiety and sleep while waiting for the Citalopam to reach a more therapeutic effect.  Keep hydrated and try to rest. We will follow-up in office as scheduled.

## 2019-02-15 ENCOUNTER — Encounter: Payer: Self-pay | Admitting: Physician Assistant

## 2019-02-15 ENCOUNTER — Other Ambulatory Visit: Payer: Self-pay

## 2019-02-15 ENCOUNTER — Ambulatory Visit: Payer: Managed Care, Other (non HMO) | Admitting: Physician Assistant

## 2019-02-15 VITALS — BP 122/84 | HR 88 | Temp 98.2°F | Resp 16 | Ht 74.0 in | Wt 214.0 lb

## 2019-02-15 DIAGNOSIS — R42 Dizziness and giddiness: Secondary | ICD-10-CM | POA: Diagnosis not present

## 2019-02-15 DIAGNOSIS — R002 Palpitations: Secondary | ICD-10-CM | POA: Diagnosis not present

## 2019-02-15 LAB — BASIC METABOLIC PANEL
BUN: 8 mg/dL (ref 6–23)
CO2: 28 mEq/L (ref 19–32)
Calcium: 9.3 mg/dL (ref 8.4–10.5)
Chloride: 104 mEq/L (ref 96–112)
Creatinine, Ser: 0.99 mg/dL (ref 0.40–1.50)
GFR: 87.77 mL/min (ref >60.00)
Glucose, Bld: 92 mg/dL (ref 70–99)
Potassium: 3.8 mEq/L (ref 3.5–5.1)
Sodium: 140 mEq/L (ref 135–145)

## 2019-02-15 LAB — CBC WITH DIFFERENTIAL/PLATELET
Basophils Absolute: 0.1 10*3/uL (ref 0.0–0.1)
Basophils Relative: 0.7 % (ref 0.0–3.0)
Eosinophils Absolute: 0.2 10*3/uL (ref 0.0–0.7)
Eosinophils Relative: 2.5 % (ref 0.0–5.0)
HCT: 44.8 % (ref 39.0–52.0)
Hemoglobin: 15.4 g/dL (ref 13.0–17.0)
Lymphocytes Relative: 27.2 % (ref 12.0–46.0)
Lymphs Abs: 2.1 10*3/uL (ref 0.7–4.0)
MCHC: 34.5 g/dL (ref 30.0–36.0)
MCV: 89.3 fl (ref 78.0–100.0)
Monocytes Absolute: 0.5 10*3/uL (ref 0.1–1.0)
Monocytes Relative: 5.9 % (ref 3.0–12.0)
Neutro Abs: 5 10*3/uL (ref 1.4–7.7)
Neutrophils Relative %: 63.7 % (ref 43.0–77.0)
Platelets: 204 10*3/uL (ref 150.0–400.0)
RBC: 5.02 Mil/uL (ref 4.22–5.81)
RDW: 13.5 % (ref 11.5–15.5)
WBC: 7.9 10*3/uL (ref 4.0–10.5)

## 2019-02-15 LAB — TSH: TSH: 2.14 u[IU]/mL (ref 0.35–4.50)

## 2019-02-15 NOTE — Patient Instructions (Addendum)
Please go to the lab today for blood work.  I will call you with your results. We will alter treatment regimen(s) if indicated by your results.   You will be contacted for holter monitor study and Ultrasound of your heart to further assess giving current symptoms.  I do think anxiety is a big component, but giving your history we need to check this out more fully.  Continue 20 mg of the Citalopram daily. I have sent in a new prescription for you. For the next 2 days, try to increase the Valium to 1-2 tablets every every 12 hours to help with anxiety.  Keep hydrated and drink an electrolyte water like gatorade once daily. Change positions slowly. Try to avoid situations that are overly stressful.

## 2019-02-15 NOTE — Progress Notes (Signed)
Patient presents to clinic today for follow-up of anxiety and pre-syncopal events. At last visit patient's Citalopram was increased from 10 to 20 mg. Was also switched from Xanax to Valium BID as needed. Notes improvement overall in mood and anxiety with increase in Citalopram. Notes mood is calmer and no depressed mood. Notes improvement in overall anxiety but is still having episodes of increased anxiety with panic and pre-syncopal lightheadedness. Notes nausea and sweating associated with these episodes. Denies any recurrence of syncope. Again workup thus far has been unremarkable. Notes history of heart murmur as a child/teenager that would cause some SOB with exertion but this resolved itself per patient. Denies skipped beats but does not heart races during episodes of anxiety.   Past Medical History:  Diagnosis Date  . Chicken pox   . Headache    due to CSF leak  . Heart murmur    "as a child"  . Spinal stenosis     Current Outpatient Medications on File Prior to Visit  Medication Sig Dispense Refill  . citalopram (CELEXA) 10 MG tablet Take 1 tablet (10 mg total) by mouth daily. 30 tablet 1  . diazepam (VALIUM) 2 MG tablet Take 1 tablet (2 mg total) by mouth every 12 (twelve) hours as needed for anxiety. 60 tablet 0   No current facility-administered medications on file prior to visit.     Allergies  Allergen Reactions  . Hydroxyzine Other (See Comments)    Dizziness, mental fogginess, nausea  . Vicodin [Hydrocodone-Acetaminophen] Hives and Itching    Family History  Problem Relation Age of Onset  . Arthritis Mother   . Hypertension Mother   . COPD Mother   . Emphysema Mother   . Arthritis Father   . Diabetes Father     Social History   Socioeconomic History  . Marital status: Legally Separated    Spouse name: Not on file  . Number of children: Not on file  . Years of education: Not on file  . Highest education level: Not on file  Occupational History  . Not on  file  Social Needs  . Financial resource strain: Not on file  . Food insecurity:    Worry: Not on file    Inability: Not on file  . Transportation needs:    Medical: Not on file    Non-medical: Not on file  Tobacco Use  . Smoking status: Former Smoker    Packs/day: 0.50    Types: Cigarettes    Last attempt to quit: 09/12/2017    Years since quitting: 1.4  . Smokeless tobacco: Never Used  Substance and Sexual Activity  . Alcohol use: Not Currently    Comment: occassional  . Drug use: No  . Sexual activity: Yes    Partners: Female    Comment: wife  Lifestyle  . Physical activity:    Days per week: Not on file    Minutes per session: Not on file  . Stress: Not on file  Relationships  . Social connections:    Talks on phone: Not on file    Gets together: Not on file    Attends religious service: Not on file    Active member of club or organization: Not on file    Attends meetings of clubs or organizations: Not on file    Relationship status: Not on file  Other Topics Concern  . Not on file  Social History Narrative  . Not on file   Review of Systems -  See HPI.  All other ROS are negative.  BP 122/84   Pulse 88   Temp 98.2 F (36.8 C) (Skin)   Resp 16   Ht 6\' 2"  (1.88 m)   Wt 214 lb (97.1 kg)   SpO2 95%   BMI 27.48 kg/m   Physical Exam Vitals signs reviewed.  Constitutional:      Appearance: Normal appearance.  HENT:     Head: Normocephalic and atraumatic.     Right Ear: Tympanic membrane normal.     Left Ear: Tympanic membrane normal.     Nose: Nose normal.     Mouth/Throat:     Mouth: Mucous membranes are moist.  Eyes:     Conjunctiva/sclera: Conjunctivae normal.     Pupils: Pupils are equal, round, and reactive to light.  Neck:     Musculoskeletal: Neck supple. No muscular tenderness.  Cardiovascular:     Rate and Rhythm: Normal rate and regular rhythm.     Pulses: Normal pulses.     Heart sounds: Murmur (Faint I-II/VI systolic murmur.  Since  childhood per patient.) present.  Pulmonary:     Effort: Pulmonary effort is normal.     Breath sounds: Normal breath sounds.  Lymphadenopathy:     Cervical: No cervical adenopathy.  Neurological:     General: No focal deficit present.     Mental Status: He is alert and oriented to person, place, and time.  Psychiatric:        Mood and Affect: Mood normal.        Thought Content: Thought content normal.     Recent Results (from the past 2160 hour(s))  Urinalysis, Routine w reflex microscopic     Status: Abnormal   Collection Time: 01/23/19  5:38 PM  Result Value Ref Range   Color, Urine STRAW (A) YELLOW   APPearance CLEAR CLEAR   Specific Gravity, Urine 1.005 1.005 - 1.030   pH 7.0 5.0 - 8.0   Glucose, UA NEGATIVE NEGATIVE mg/dL   Hgb urine dipstick NEGATIVE NEGATIVE   Bilirubin Urine NEGATIVE NEGATIVE   Ketones, ur NEGATIVE NEGATIVE mg/dL   Protein, ur NEGATIVE NEGATIVE mg/dL   Nitrite NEGATIVE NEGATIVE   Leukocytes,Ua NEGATIVE NEGATIVE    Comment: Performed at [facility], [address], [place], [place] [address]  Basic metabolic panel     Status: Abnormal   Collection Time: 01/23/19  5:55 PM  Result Value Ref Range   Sodium 137 135 - 145 mmol/L   Potassium 3.8 3.5 - 5.1 mmol/L   Chloride 104 98 - 111 mmol/L   CO2 26 22 - 32 mmol/L   Glucose, Bld 107 (H) 70 - 99 mg/dL   BUN 11 6 - 20 mg/dL   Creatinine, Ser 1.05 0.61 - 1.24 mg/dL   Calcium 9.6 8.9 - 10.3 mg/dL   GFR calc non Af Amer >60 >60 mL/min   GFR calc Af Amer >60 >60 mL/min   Anion gap 7 5 - 15    Comment: Performed at [facility], [address], [place], [place] [address]  CBC     Status: Abnormal   Collection Time: 01/23/19  5:55 PM  Result Value Ref Range   WBC 11.2 (H) 4.0 - 10.5 K/uL   RBC 5.42 4.22 - 5.81 MIL/uL   Hemoglobin 16.1 13.0 - 17.0 g/dL   HCT 48.6 39.0 - 52.0 %   MCV 89.7 80.0 - 100.0 fL   MCH 29.7 26.0 - 34.0 pg   MCHC  33.1 30.0 - 36.0 g/dL   RDW 13.0 11.5 - 15.5 %   Platelets 218  150 - 400 K/uL   nRBC 0.0 0.0 - 0.2 %    Comment: Performed at [facility], [address], [place], [place] [address]  CBG monitoring, ED     Status: None   Collection Time: 01/23/19  6:08 PM  Result Value Ref Range   Glucose-Capillary 76 70 - 99 mg/dL    Assessment/Plan: 1. Episodic lightheadedness  2. Palpitations Felt to be secondary to anxiety/panic attack. Slightly improved since addition of SSRI but still occurring a few times during the day. Will repeat CBC and check BMP, TSH. EKG today without concerning findings. Will obtain Holter study and set up for an Echo as well giving audible murmur (chronic per patient). Strict ER precautions reviewed with patient. Will increased Valium to 1-2 tablets BID. Close follow-up scheduled.   - EKG 12-Lead - Holter monitor - 48 hour; Future - CBC w/Diff - Basic metabolic panel - TSH - ECHOCARDIOGRAM COMPLETE; Future   [name], PA-C

## 2019-02-16 ENCOUNTER — Encounter: Payer: Self-pay | Admitting: Physician Assistant

## 2019-02-16 ENCOUNTER — Other Ambulatory Visit: Payer: Self-pay | Admitting: Physician Assistant

## 2019-02-16 ENCOUNTER — Ambulatory Visit: Payer: Managed Care, Other (non HMO) | Admitting: Physician Assistant

## 2019-02-16 DIAGNOSIS — F411 Generalized anxiety disorder: Secondary | ICD-10-CM

## 2019-02-20 MED ORDER — DIAZEPAM 5 MG PO TABS: 5.0000 mg | ORAL_TABLET | Freq: Three times a day (TID) | ORAL | 0 refills | Status: DC | PRN

## 2019-02-20 NOTE — Addendum Note (Signed)
Addended by: Brunetta Jeans on: 02/20/2019 03:42 PM   Modules accepted: Orders

## 2019-02-20 NOTE — Addendum Note (Signed)
Addended by: Brunetta Jeans on: 02/20/2019 07:36 AM   Modules accepted: Orders

## 2019-02-22 ENCOUNTER — Telehealth (HOSPITAL_COMMUNITY): Payer: Self-pay | Admitting: Cardiology

## 2019-02-22 ENCOUNTER — Other Ambulatory Visit: Payer: Self-pay

## 2019-02-22 ENCOUNTER — Ambulatory Visit (HOSPITAL_COMMUNITY): Payer: Managed Care, Other (non HMO) | Attending: Cardiology

## 2019-02-22 DIAGNOSIS — R002 Palpitations: Secondary | ICD-10-CM

## 2019-02-22 DIAGNOSIS — R42 Dizziness and giddiness: Secondary | ICD-10-CM | POA: Diagnosis present

## 2019-02-22 NOTE — Telephone Encounter (Signed)

## 2019-02-27 ENCOUNTER — Encounter: Payer: Self-pay | Admitting: Physician Assistant

## 2019-02-27 DIAGNOSIS — F411 Generalized anxiety disorder: Secondary | ICD-10-CM

## 2019-02-27 MED ORDER — CITALOPRAM HYDROBROMIDE 20 MG PO TABS: 20.0000 mg | ORAL_TABLET | Freq: Every day | ORAL | 1 refills | Status: DC

## 2019-03-07 ENCOUNTER — Encounter: Payer: Self-pay | Admitting: Physician Assistant

## 2019-03-08 ENCOUNTER — Other Ambulatory Visit: Payer: Self-pay

## 2019-03-08 ENCOUNTER — Encounter: Payer: Self-pay | Admitting: Physician Assistant

## 2019-03-08 ENCOUNTER — Ambulatory Visit (INDEPENDENT_AMBULATORY_CARE_PROVIDER_SITE_OTHER): Payer: Managed Care, Other (non HMO) | Admitting: Physician Assistant

## 2019-03-08 DIAGNOSIS — J302 Other seasonal allergic rhinitis: Secondary | ICD-10-CM | POA: Diagnosis not present

## 2019-03-08 DIAGNOSIS — G44209 Tension-type headache, unspecified, not intractable: Secondary | ICD-10-CM | POA: Diagnosis not present

## 2019-03-08 MED ORDER — BENZONATATE 100 MG PO CAPS: 100.0000 mg | ORAL_CAPSULE | Freq: Three times a day (TID) | ORAL | 0 refills | Status: DC | PRN

## 2019-03-08 MED ORDER — FLUTICASONE PROPIONATE 50 MCG/ACT NA SUSP: 2.0000 | Freq: Every day | NASAL | 0 refills | Status: DC

## 2019-03-08 NOTE — Progress Notes (Signed)
I have discussed the procedure for the virtual visit with the patient who has given consent to proceed with assessment and treatment.   Aitan Rossbach S Basem Yannuzzi, CMA     

## 2019-03-08 NOTE — Patient Instructions (Signed)
Please take the Tessalon as directed for cough. Start the Flonase every other day over the next 1-2 weeks. Continue OTC Claritin daily.  Apply Icy Hot or Aspercreme to the neck  Can consider a heating pad as well. Really feel if we can calm allergic cough down then the muscle tension will also improve.   Hang in there!
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
## 2019-03-12 ENCOUNTER — Encounter: Payer: Self-pay | Admitting: Physician Assistant

## 2019-03-20 ENCOUNTER — Ambulatory Visit: Payer: Self-pay | Admitting: *Deleted

## 2019-03-20 ENCOUNTER — Encounter: Payer: Self-pay | Admitting: Physician Assistant
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
## 2019-03-21 ENCOUNTER — Telehealth: Payer: Self-pay | Admitting: *Deleted

## 2019-03-21 ENCOUNTER — Other Ambulatory Visit: Payer: Self-pay

## 2019-03-21 ENCOUNTER — Encounter: Payer: Self-pay | Admitting: Physician Assistant

## 2019-03-21 ENCOUNTER — Ambulatory Visit (INDEPENDENT_AMBULATORY_CARE_PROVIDER_SITE_OTHER): Payer: Managed Care, Other (non HMO) | Admitting: Physician Assistant

## 2019-03-21 DIAGNOSIS — F410 Panic disorder [episodic paroxysmal anxiety] without agoraphobia: Secondary | ICD-10-CM | POA: Diagnosis not present

## 2019-03-21 DIAGNOSIS — F411 Generalized anxiety disorder: Secondary | ICD-10-CM

## 2019-03-21 MED ORDER — CITALOPRAM HYDROBROMIDE 40 MG PO TABS: 40.0000 mg | ORAL_TABLET | Freq: Every day | ORAL | 3 refills | Status: DC

## 2019-03-21 MED ORDER — DIAZEPAM 5 MG PO TABS: 5.0000 mg | ORAL_TABLET | Freq: Three times a day (TID) | ORAL | 0 refills | Status: DC | PRN
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
## 2019-03-21 NOTE — Telephone Encounter (Signed)
Please call to follow-up with patient and to schedule VV or in-office visit if needed.

## 2019-03-21 NOTE — Telephone Encounter (Signed)
3 day ZIO XT long term holter monitor to be mailed to your home.  Instructions reviewed briefly as they are included in the monitor kit.  Patient may remove and mail back after 48 hours.

## 2019-03-21 NOTE — Progress Notes (Signed)
I have discussed the procedure for the virtual visit with the patient who has given consent to proceed with assessment and treatment.   Khloey Chern S Saraiyah Hemminger, CMA     

## 2019-03-21 NOTE — Patient Instructions (Signed)
Instructions sent to MyChart.  Please continue the Diazepam as directed as needed for more acute anxiety. Will start the new dose of the Citalopram once daily as directed.  Follow-up with me via MyChart in 2 weeks for reassessment of mood/anxiety. I will try to reach out more to the specialist to see what our options are since they are only doing 30-day monitors right now.  Please reach out to your HR representative to have them send me FMLA paperwork so that we can get you some coverage in case of absence due to a panic attack while we are getting this under control.

## 2019-03-21 NOTE — Telephone Encounter (Signed)
Spoke with patient regarding symptoms.  Patient reports panic attack x 2 yesterday, had to leave work early.  Patient states he is "okay today", but is fidgety. Has taken medications today.  Scheduled with PCP today via VV.

## 2019-03-22 ENCOUNTER — Encounter: Payer: Self-pay | Admitting: Physician Assistant

## 2019-03-26 NOTE — Telephone Encounter (Signed)
Spoke with patient regarding symptoms/change in citalopram dosage.  Patient states he is tolerating dosage increase "pretty good today". He states he is not as tired/sleepy today and has more energy.  Patient does c/o increased bruising, denies injury. Patient reports he is easily distracted, concerned for attention deficit disorder (family hx). Advised to observe for excessive bruising/bleeding, call with worsening symptoms. Pt verbalized understanding.

## 2019-03-26 NOTE — Telephone Encounter (Signed)
Please contact patient to see how he is doing today. Thank you.

## 2019-03-27 ENCOUNTER — Ambulatory Visit (INDEPENDENT_AMBULATORY_CARE_PROVIDER_SITE_OTHER): Payer: Managed Care, Other (non HMO)

## 2019-03-27 ENCOUNTER — Encounter: Payer: Self-pay | Admitting: Physician Assistant

## 2019-03-27 DIAGNOSIS — R002 Palpitations: Secondary | ICD-10-CM

## 2019-03-27 DIAGNOSIS — R42 Dizziness and giddiness: Secondary | ICD-10-CM

## 2019-04-01 ENCOUNTER — Encounter: Payer: Self-pay | Admitting: Physician Assistant

## 2019-04-02 ENCOUNTER — Ambulatory Visit (INDEPENDENT_AMBULATORY_CARE_PROVIDER_SITE_OTHER): Payer: Managed Care, Other (non HMO) | Admitting: Physician Assistant

## 2019-04-02 ENCOUNTER — Encounter: Payer: Self-pay | Admitting: Physician Assistant

## 2019-04-02 ENCOUNTER — Other Ambulatory Visit: Payer: Self-pay

## 2019-04-02 DIAGNOSIS — F39 Unspecified mood [affective] disorder: Secondary | ICD-10-CM

## 2019-04-02 MED ORDER — ARIPIPRAZOLE 2 MG PO TABS: 2.0000 mg | ORAL_TABLET | Freq: Every day | ORAL | 1 refills | Status: DC

## 2019-04-02 NOTE — Progress Notes (Signed)
I have discussed the procedure for the virtual visit with the patient who has given consent to proceed with assessment and treatment.   Jay Montgomery Jay Montgomery, CMA     

## 2019-04-02 NOTE — Progress Notes (Signed)
Virtual Visit via Video   I connected with patient on 04/02/19 at 10:00 AM EDT by a video enabled telemedicine application and verified that I am speaking with the correct person using two identifiers.  Location patient: Home Location provider: [facility], Office Persons participating in the virtual visit: Patient, Provider, [place] (Patina Moore)  I discussed the limitations of evaluation and management by telemedicine and the availability of in person appointments. The patient expressed understanding and agreed to proceed.  Subjective:   HPI:   Patient presents today via Doxy.Me to further discuss mood.  Patient is currently on a regimen of citalopram, recently increased to 40 mg daily for depression and anxiety.  Patient endorses taking medication as directed.  Notes improvement in levels of anxiety, but feels his mood has continued to be "off".  Patient notes extreme difficulty dealing with irritability.  Notes having fits of rage, although denies thoughts of harming others.  These are mainly triggered by other individuals outside of his family.  Patient states he feels that his mood has been very fluctuant, even before starting medication.  Now endorses episodes of "highs", where he feels very good but makes reckless choices.  Wife is present for visit and notes carelessness with money during these episodes.  Notes extremely labile mood.  Patient denies suicidal thought or ideation.  Patient states his lows are more so associated with anhedonia then very extreme levels of depressed mood.  Wife mentions that patient has family history of bipolar disorder.  ROS:   See pertinent positives and negatives per HPI.  Patient Active Problem List   Diagnosis Date Noted  . GAD (generalized anxiety disorder) 01/25/2019  . Postoperative CSF leak 12/01/2017  . Spinal stenosis of lumbar region 02/15/2017    Social History   Tobacco Use  . Smoking status: Former Smoker    Packs/day: 0.50     Types: Cigarettes    Quit date: 09/12/2017    Years since quitting: 1.5  . Smokeless tobacco: Never Used  Substance Use Topics  . Alcohol use: Not Currently    Comment: occassional    Current Outpatient Medications:  .  citalopram (CELEXA) 40 MG tablet, Take 1 tablet (40 mg total) by mouth daily., Disp: 30 tablet, Rfl: 3 .  diazepam (VALIUM) 5 MG tablet, Take 1 tablet (5 mg total) by mouth every 8 (eight) hours as needed for anxiety., Disp: 90 tablet, Rfl: 0 .  fluticasone (FLONASE) 50 MCG/ACT nasal spray, Place 2 sprays into both nostrils daily., Disp: 16 g, Rfl: 0  Allergies  Allergen Reactions  . Hydroxyzine Other (See Comments)    Dizziness, mental fogginess, nausea  . Vicodin [Hydrocodone-Acetaminophen] Hives and Itching    Objective:   There were no vitals taken for this visit.  Patient is well-developed, well-nourished in no acute distress.  Resting comfortably at home.  Head is normocephalic, atraumatic.  No labored breathing.  Speech is clear and coherent with logical contest.  Patient is alert and oriented at baseline.   Assessment and Plan:   1. Mood disorder ([place]) Question bipolar disorder versus unipolar depression originally thought to be the issue.  Will start a wean down off citalopram, reducing to 20 mg once daily over the next week, then 10 mg once daily for a week before stopping.  Will begin Abilify 2 mg daily.  Referral to psychiatry placed for further assessment.  Video visit scheduled for 2 weeks to follow-up regarding this and for further titration of mood stabilizing medication.  Strict ER precautions reviewed with patient and wife.  They voiced understanding and agreement with plan. - Ambulatory referral to Psychiatry - ARIPiprazole (ABILIFY) 2 MG tablet; Take 1 tablet (2 mg total) by mouth daily.  Dispense: 30 tablet; Refill: [place], [place] 04/02/2019

## 2019-04-02 NOTE — Patient Instructions (Signed)
Instructions sent to MyChart.  Please start weaning off of the Citalopram as follows:  - Cut back from 40 mg to 20 mg once daily over the next week.  - Then reduce to 10 mg daily for 1 week (1/2 tablet of your leftover 20 mg tablets)  -  Then stop.  Start the Abilify once daily as directed. I am setting you up with Psychiatry for further management.  Follow-up with me via video visit as scheduled in 2 weeks.

## 2019-04-03 ENCOUNTER — Encounter: Payer: Self-pay | Admitting: Physician Assistant

## 2019-04-04 NOTE — Telephone Encounter (Signed)
Please see MyChart message. Can we see about getting him a male psychiatrist per patient preference? Referral had already been placed.

## 2019-04-04 NOTE — Addendum Note (Signed)
Addended by: Katina Dung on: 04/04/2019 08:05 AM   Modules accepted: Orders

## 2019-04-04 NOTE — Telephone Encounter (Signed)
LM for [facility] to call back to give me a update on scheduling pts.

## 2019-04-04 NOTE — Addendum Note (Signed)
Addended by: Katina Dung on: 04/04/2019 11:11 AM   Modules accepted: Orders

## 2019-04-05 ENCOUNTER — Encounter: Payer: Self-pay | Admitting: Physician Assistant

## 2019-04-05 NOTE — Telephone Encounter (Signed)
The referral was for a psychiatrist not a counselor/psychologist. Ok to leave current appt for counseling (noted in referral) as this is already scheduled but he needs to see a psychiatrist

## 2019-04-09 ENCOUNTER — Telehealth: Payer: Self-pay | Admitting: Physician Assistant

## 2019-04-09 NOTE — Telephone Encounter (Signed)
FMLA paperwork cannot be completed as is. The forms he dropped off are for caregiver FMLA and not personal FMLA. Needs to have work send correct forms to our office so we can complete for him. Can be faxed.

## 2019-04-10 NOTE — Telephone Encounter (Signed)
Spoke with pt he will get the correct forms faxed over

## 2019-04-12 ENCOUNTER — Encounter: Payer: Self-pay | Admitting: Physician Assistant

## 2019-04-12 ENCOUNTER — Other Ambulatory Visit: Payer: Self-pay | Admitting: Physician Assistant

## 2019-04-12 DIAGNOSIS — R42 Dizziness and giddiness: Secondary | ICD-10-CM

## 2019-04-12 DIAGNOSIS — R002 Palpitations: Secondary | ICD-10-CM

## 2019-04-16 ENCOUNTER — Ambulatory Visit (INDEPENDENT_AMBULATORY_CARE_PROVIDER_SITE_OTHER): Payer: Managed Care, Other (non HMO) | Admitting: Physician Assistant

## 2019-04-16 ENCOUNTER — Encounter: Payer: Self-pay | Admitting: Physician Assistant

## 2019-04-16 ENCOUNTER — Other Ambulatory Visit: Payer: Self-pay

## 2019-04-16 DIAGNOSIS — F39 Unspecified mood [affective] disorder: Secondary | ICD-10-CM | POA: Diagnosis not present

## 2019-04-16 DIAGNOSIS — F411 Generalized anxiety disorder: Secondary | ICD-10-CM | POA: Diagnosis not present

## 2019-04-16 NOTE — Progress Notes (Signed)
I have discussed the procedure for the virtual visit with the patient who has given consent to proceed with assessment and treatment.   Jay Montgomery, CMA     

## 2019-04-16 NOTE — Progress Notes (Signed)
   Virtual Visit via Video   I connected with patient on 04/16/19 at 10:00 AM EDT by a video enabled telemedicine application and verified that I am speaking with the correct person using two identifiers.  Location patient: Home Location provider: [facility], Office Persons participating in the virtual visit: Patient, Provider, [place] (Patina Moore)  I discussed the limitations of evaluation and management by telemedicine and the availability of in person appointments. The patient expressed understanding and agreed to proceed.  Subjective:   HPI:   Patient presents via Doxy.me today to follow-up regarding bipolar depression.  At last visit it was deemed he was most likely suffering more so from bipolar depression and anxiety disorder, versus unipolar depression.  As such he started to wean him off of citalopram.  He is now on 10 mg daily and has been doing so for a week.  Is about to wean further before completely stopping.  Patient was placed on Abilify 2 mg daily at last visit.  Patient endorses taking as directed.  Has noted a little bit of a scratchy throat and was questioning if this might be a side effect.  Otherwise tolerating very well.  Patient notes he is being more cognizant of his anxiety triggers.  Is trying to avoid provoking situations if possible.  At last visit, patient was referred to psychiatry.  Has appointment on Wednesday with Dr. Theda Sers.  ROS:   See pertinent positives and negatives per HPI.  Patient Active Problem List   Diagnosis Date Noted  . GAD (generalized anxiety disorder) 01/25/2019  . Postoperative CSF leak 12/01/2017  . Spinal stenosis of lumbar region 02/15/2017    Social History   Tobacco Use  . Smoking status: Former Smoker    Packs/day: 0.50    Types: Cigarettes    Quit date: 09/12/2017    Years since quitting: 1.5  . Smokeless tobacco: Never Used  Substance Use Topics  . Alcohol use: Not Currently    Comment: occassional     Current Outpatient Medications:  .  ARIPiprazole (ABILIFY) 2 MG tablet, Take 1 tablet (2 mg total) by mouth daily., Disp: 30 tablet, Rfl: 1 .  diazepam (VALIUM) 5 MG tablet, Take 1 tablet (5 mg total) by mouth every 8 (eight) hours as needed for anxiety., Disp: 90 tablet, Rfl: 0 .  fluticasone (FLONASE) 50 MCG/ACT nasal spray, Place 2 sprays into both nostrils daily., Disp: 16 g, Rfl: 0  Allergies  Allergen Reactions  . Hydroxyzine Other (See Comments)    Dizziness, mental fogginess, nausea  . Vicodin [Hydrocodone-Acetaminophen] Hives and Itching    Objective:   There were no vitals taken for this visit.  Patient is well-developed, well-nourished in no acute distress.  Resting comfortably at home.  Head is normocephalic, atraumatic.  No labored breathing.  Speech is clear and coherent with logical contest.  Patient is alert and oriented at baseline.   Assessment and Plan:   1. GAD (generalized anxiety disorder) 2. Mood disorder ([place]) Patient to cut back on citalopram to 10 mg every other day until his appointment Wednesday with psychiatry.  Will likely be completely stopped at that time.  Continue Abilify as directed.  Will defer further medication changes to psychiatry as he still needs to get a formal diagnosis of bipolar disorder.  Continue Diazepam as needed for acute anxiety.     [name], PA-C 04/16/2019

## 2019-04-16 NOTE — Patient Instructions (Signed)
Instructions sent to my chart.  Please refax your FMLA paperwork to our office. The number is [phone]. I will fill this out once I have received it.  Cut back the citalopram to 1 tablet every other day until your appointment this week with psychiatry. Continue Abilify as directed.

## 2019-04-18 ENCOUNTER — Ambulatory Visit (INDEPENDENT_AMBULATORY_CARE_PROVIDER_SITE_OTHER): Payer: 59 | Admitting: Professional

## 2019-04-18 ENCOUNTER — Encounter: Payer: Self-pay | Admitting: Physician Assistant

## 2019-04-18 ENCOUNTER — Other Ambulatory Visit: Payer: Self-pay | Admitting: Physician Assistant

## 2019-04-18 ENCOUNTER — Other Ambulatory Visit: Payer: Self-pay

## 2019-04-18 ENCOUNTER — Telehealth (HOSPITAL_COMMUNITY): Payer: Self-pay | Admitting: Psychiatry

## 2019-04-18 ENCOUNTER — Other Ambulatory Visit (HOSPITAL_COMMUNITY): Payer: 59 | Attending: Psychiatry | Admitting: Psychiatry

## 2019-04-18 DIAGNOSIS — F39 Unspecified mood [affective] disorder: Secondary | ICD-10-CM

## 2019-04-18 DIAGNOSIS — F6381 Intermittent explosive disorder: Secondary | ICD-10-CM | POA: Diagnosis not present

## 2019-04-18 DIAGNOSIS — F411 Generalized anxiety disorder: Secondary | ICD-10-CM

## 2019-04-18 NOTE — Progress Notes (Unsigned)
Virtual Visit via Video Note  I connected with Jay Montgomery on 04/18/19 at 1530 by a video enabled telemedicine application and verified that I am speaking with the correct person using two identifiers.  I discussed the limitations of evaluation and management by telemedicine and the availability of in person appointments. The patient expressed understanding and agreed to proceed.  I discussed the assessment and treatment plan with the patient. The patient was provided an opportunity to ask questions and all were answered. The patient agreed with the plan and demonstrated an understanding of the instructions.   The patient was advised to call back or seek an in-person evaluation if the symptoms worsen or if the condition fails to improve as anticipated.  I provided 50 minutes of non-face-to-face time during this encounter.
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
## 2019-04-18 NOTE — Telephone Encounter (Signed)
D:  Jay Montgomery, [facility] referred pt to MH-IOP.  A:  Placed call to orient and complete assessment with pt.  Pt will start MH-IOP tomorrow (9-12 noon) virtually.  R:  Pt receptive.

## 2019-04-18 NOTE — Telephone Encounter (Signed)
Please see referral. I put referral in to psychiatry for patient and it seems he was set up with a counselor instead who cannot prescribe medication.  Please have patient continue counseling and let him know we are making sure he is also set up with a psychiatrist as this was what the referral was for.

## 2019-04-19 ENCOUNTER — Telehealth (HOSPITAL_COMMUNITY): Payer: Self-pay | Admitting: Psychiatry

## 2019-04-19 ENCOUNTER — Other Ambulatory Visit: Payer: Self-pay | Admitting: Physician Assistant

## 2019-04-19 MED ORDER — DIAZEPAM 5 MG PO TABS: 5.0000 mg | ORAL_TABLET | Freq: Three times a day (TID) | ORAL | 0 refills | Status: DC | PRN

## 2019-04-19 NOTE — Telephone Encounter (Signed)
D:  Pt was referred by therapist Francie Massing, [facility]).  Pt didn't sign on for MH-IOP this morning.  A:  Placed call to patient and he eventually picked up; stating that he forgot about group this morning, but has decided not to do group but see his new psychiatrist on 05-02-19.  Writer will inform Francie Massing, LPC.  R:  Pt receptive.

## 2019-04-19 NOTE — Telephone Encounter (Signed)
Last OV 04/16/19 Valium last filled 03/21/19 #90 with 0

## 2019-04-20 ENCOUNTER — Telehealth: Payer: Self-pay

## 2019-04-20 NOTE — Telephone Encounter (Signed)
Spoke with patient and informed him of the information that was given to me by the pharmacy and PCP. I tried to explain that even though the prescription was sent in on 03/21/19, he did not fill script until 03/26/19. Pharmacy is unable to fill new script until 04/24/19. Patient became upset stating that he was going on vacation today and that if he "wigs out at the beach, he will call us." I was trying to calm patient down when he terminated the call.

## 2019-04-20 NOTE — Telephone Encounter (Signed)
Attempted to notify patient. VM full

## 2019-04-20 NOTE — Telephone Encounter (Signed)
Patient called in stating the pharmacy is denying to refill DIAZEPAM 0.5 mg until August 4th.  Spoke with pharmacist at [facility] [employer], patient last picked up medication on July 6th. Pharmacist stated that patient should have 15 tabs left, but admitted to taking more than 3 daily. Patient said that he is going out of town today and needs refill.   OK to give approval for early refill or does patient need to wait til August 4th? Please advise.

## 2019-04-20 NOTE — Telephone Encounter (Signed)
Cannot be filled until due. He should not be taking more than as directed.

## 2019-04-25 ENCOUNTER — Other Ambulatory Visit: Payer: Self-pay

## 2019-04-25 DIAGNOSIS — R001 Bradycardia, unspecified: Secondary | ICD-10-CM

## 2019-04-30 ENCOUNTER — Encounter: Payer: Self-pay | Admitting: Physician Assistant

## 2019-04-30 ENCOUNTER — Ambulatory Visit (INDEPENDENT_AMBULATORY_CARE_PROVIDER_SITE_OTHER): Payer: Managed Care, Other (non HMO) | Admitting: Physician Assistant

## 2019-04-30 ENCOUNTER — Other Ambulatory Visit: Payer: Self-pay

## 2019-04-30 VITALS — BP 99/62 | HR 100

## 2019-04-30 DIAGNOSIS — F411 Generalized anxiety disorder: Secondary | ICD-10-CM

## 2019-04-30 MED ORDER — CLONAZEPAM 1 MG PO TABS: 1.0000 mg | ORAL_TABLET | Freq: Two times a day (BID) | ORAL | 0 refills | Status: DC

## 2019-04-30 NOTE — Progress Notes (Signed)
I have discussed the procedure for the virtual visit with the patient who has given consent to proceed with assessment and treatment.   Jay Montgomery S Reghan Thul, CMA     

## 2019-04-30 NOTE — Progress Notes (Signed)
   Virtual Visit via Video   I connected with patient on 04/30/19 at  1:00 PM EDT by a video enabled telemedicine application and verified that I am speaking with the correct person using two identifiers.  Location patient: Home Location provider: [facility], Office Persons participating in the virtual visit: Patient, Provider, [place] (Patina Moore)  I discussed the limitations of evaluation and management by telemedicine and the availability of in person appointments. The patient expressed understanding and agreed to proceed.  Subjective:   HPI:   Patient presents via Doxy.Me today to discuss issue with FMLA paperwork as well as to follow-up on mood disorder. Patient has started counseling sessions with his therapist. Is taking his Abilify daily. Has weaned Citalopram down to 10 mg QOD. Notes mood stable overall. Still some breakthrough anxiety that is not helped with diazepam. Did get an appointment scheduled with Psychiatry, scheduled for this Wednesday 05/02/2019. In regards to his FMLA he was written for Intermittent FMLA to cover flares of anxiety and to cover appointments. Was notified by HR supervisor that they will not accept intermittent FMLA. He will either have to take a full 12 weeks off or need [place] to write a letter terminating the FMLA for now. States he wants to work and does not understand why the [facility] could not be granted.  ROS:   See pertinent positives and negatives per HPI.  Patient Active Problem List   Diagnosis Date Noted  . GAD (generalized anxiety disorder) 01/25/2019  . Postoperative CSF leak 12/01/2017  . Spinal stenosis of lumbar region 02/15/2017    Social History   Tobacco Use  . Smoking status: Former Smoker    Packs/day: 0.50    Types: Cigarettes    Quit date: 09/12/2017    Years since quitting: 1.6  . Smokeless tobacco: Never Used  Substance Use Topics  . Alcohol use: Not Currently    Comment: occassional    Current Outpatient  Medications:  .  ARIPiprazole (ABILIFY) 2 MG tablet, Take 1 tablet (2 mg total) by mouth daily., Disp: 30 tablet, Rfl: 1 .  fluticasone (FLONASE) 50 MCG/ACT nasal spray, Place 2 sprays into both nostrils daily., Disp: 16 g, Rfl: 0 .  clonazePAM (KLONOPIN) 1 MG tablet, Take 1 tablet (1 mg total) by mouth 2 (two) times daily., Disp: 20 tablet, Rfl: 0  Allergies  Allergen Reactions  . Hydroxyzine Other (See Comments)    Dizziness, mental fogginess, nausea  . Vicodin [Hydrocodone-Acetaminophen] Hives and Itching    Objective:   BP 99/62   Pulse 100   Patient is well-developed, well-nourished in no acute distress.  Resting comfortably at home.  Head is normocephalic, atraumatic.  No labored breathing.  Speech is clear and coherent with logical contest.  Patient is alert and oriented at baseline.   Assessment and Plan:        1. GAD (generalized anxiety disorder) Dicussed cessation of Citalopram. Continue Abilify. Will stop Diazepam and give short trial of Klonopin for acute anxiety. He is aware specialist will be taking this over. In terms of FMLA will write letter to terminate this. Discussed with him recommendation to speak with another rep or manager regarding this as I have never encountered this in my 6 years of practice.     [name], PA-C 04/30/2019

## 2019-04-30 NOTE — Patient Instructions (Signed)
Instructions sent to MyChart.  I will send you a letter shortly to terminate current FMLA.  I would still recommend speaking to someone else in HR about what the original person told you. Go ahead and stop the Citalopram now that you have weaned. Continue Abilify. I have sent in a script for Klonopin to use for acute anxiety. Follow-up with Psychiatry Wednesday for your new patient appointment.

## 2019-05-02 ENCOUNTER — Encounter: Payer: Self-pay | Admitting: Adult Health

## 2019-05-02 ENCOUNTER — Ambulatory Visit (INDEPENDENT_AMBULATORY_CARE_PROVIDER_SITE_OTHER): Payer: 59 | Admitting: Adult Health

## 2019-05-02 ENCOUNTER — Other Ambulatory Visit: Payer: Self-pay

## 2019-05-02 DIAGNOSIS — G4700 Insomnia, unspecified: Secondary | ICD-10-CM

## 2019-05-02 DIAGNOSIS — F39 Unspecified mood [affective] disorder: Secondary | ICD-10-CM | POA: Diagnosis not present

## 2019-05-02 DIAGNOSIS — F411 Generalized anxiety disorder: Secondary | ICD-10-CM | POA: Diagnosis not present

## 2019-05-02 MED ORDER — CLONAZEPAM 1 MG PO TABS: 1.0000 mg | ORAL_TABLET | Freq: Three times a day (TID) | ORAL | 0 refills | Status: DC | PRN

## 2019-05-02 MED ORDER — ARIPIPRAZOLE 5 MG PO TABS: ORAL_TABLET | ORAL | 2 refills | Status: DC
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
## 2019-05-16 ENCOUNTER — Ambulatory Visit (INDEPENDENT_AMBULATORY_CARE_PROVIDER_SITE_OTHER): Payer: 59 | Admitting: Adult Health

## 2019-05-16 ENCOUNTER — Encounter: Payer: Self-pay | Admitting: Adult Health

## 2019-05-16 ENCOUNTER — Other Ambulatory Visit: Payer: Self-pay

## 2019-05-16 DIAGNOSIS — F331 Major depressive disorder, recurrent, moderate: Secondary | ICD-10-CM

## 2019-05-16 DIAGNOSIS — F319 Bipolar disorder, unspecified: Secondary | ICD-10-CM

## 2019-05-16 DIAGNOSIS — F909 Attention-deficit hyperactivity disorder, unspecified type: Secondary | ICD-10-CM

## 2019-05-16 DIAGNOSIS — F411 Generalized anxiety disorder: Secondary | ICD-10-CM

## 2019-05-16 NOTE — Progress Notes (Signed)
[facility] MD/PA/NP Initial Note  05/16/2019 8:32 AM Jay Montgomery  MRN:  [id number]  Chief Complaint: Mood disorder, Anxiety, depression, ADHD, and Insomnia  HPI:   Describes mood today as "a little better". Mood symptoms - feels anxious, and irritable - "but better". Panic attacks have calmed down a "little bit". Gets really "aggravated" at times. Abilify has helped with sleep and irritability. Denies feeling depressed. More "stressed" over all. Still throws up, but not like he was. Has to go get daughter in [place]. Has no money to go get her. Feels more "stressed than depressed". More "anxious" some days than others. Stating "I get bored a lot, but I'm trying to find things to do". [place].  Feels "iffy about going out in public". Took dog out with him to [employer] and felt safe and secure. Don't like to talk on the phone. Improved interest and motivation. Taking medications as prescribed. Energy levels stable. Active, exercises 3 times a week at home. Bought a 40 pound punching bag and broke it in 2 days. Riding 4-wheelers on the weekends. It did help - "not as intense or tight". Typically works full time for [employer]. Is out on FMLA for 12 weeks because of his current "issues". Last day of FMLA is October 29. Returns to November 1st. Enjoys some usual interests. Spending time with family. Having some issues with step-son ADHD. Stating "we need to get his medication changed".  Appetite improved. Stating "I eat once or twice a day". Weight stable.   Sleeping better at night. Averages 6 to 8 hours a day. Had been sleeping 2 to 3 hours a night. Stating "I'm sleeping better".  Focus and concentration difficulties. Can't focus on things. Talked to a person recently and changed topics 3 times in 30 mintues. Completing tasks. Manages aspects of household. Mowing grass.  Denies SI or HI.  Denies AH or VH.  Medication trials: Xanax, Celexa, Valium  Visit Diagnosis:  No  diagnosis found.  Past Psychiatric History:   Past Medical History:  Past Medical History:  Diagnosis Date  . Anxiety   . Chicken pox   . Headache    due to CSF leak  . Heart murmur    "as a child"  . Spinal stenosis     Past Surgical History:  Procedure Laterality Date  . ADENOIDECTOMY    . BACK SURGERY  08/17/2017   L4-L5  . LUMBAR LAMINECTOMY/DECOMPRESSION MICRODISCECTOMY N/A 12/01/2017   Procedure: REPAIR OF CEREBROSPINAL FLUID LEAK and Placement of Lumbar Drain;  Surgeon: Earnie Larsson, MD;  Location: [facility];  Service: Neurosurgery;  Laterality: N/A;  . TONSILLECTOMY      Family Psychiatric History: Mother - deceased 7 years ago COPD and emphysema. Was Bipolar - manic depressive. Sister 64 bipolar/Schizophrena.   Family History:  Family History  Problem Relation Age of Onset  . Arthritis Mother   . Hypertension Mother   . COPD Mother   . Emphysema Mother   . Bipolar disorder Mother   . Arthritis Father   . Diabetes Father   . Bipolar disorder Sister   . Schizophrenia Sister     Social History:  Social History   Socioeconomic History  . Marital status: Legally Separated    Spouse name: Not on file  . Number of children: Not on file  . Years of education: Not on file  . Highest education level: Not on file  Occupational History  . Occupation: Public house manager  Social Needs  .  Financial resource strain: Not on file  . Food insecurity    Worry: Not on file    Inability: Not on file  . Transportation needs    Medical: Not on file    Non-medical: Not on file  Tobacco Use  . Smoking status: Current Every Day Smoker    Packs/day: 1.00    Types: Cigarettes  . Smokeless tobacco: Never Used  Substance and Sexual Activity  . Alcohol use: Not Currently    Comment: occassional  . Drug use: No  . Sexual activity: Yes    Partners: Female    Comment: wife  Lifestyle  . Physical activity    Days per week: Not on file    Minutes per session: Not on file  .  Stress: Not on file  Relationships  . Social Herbalist on phone: Not on file    Gets together: Not on file    Attends religious service: Not on file    Active member of club or organization: Not on file    Attends meetings of clubs or organizations: Not on file    Relationship status: Not on file  Other Topics Concern  . Not on file  Social History Narrative  . Not on file    Allergies:  Allergies  Allergen Reactions  . Hydroxyzine Other (See Comments)    Dizziness, mental fogginess, nausea  . Vicodin [Hydrocodone-Acetaminophen] Hives and Itching    Metabolic Disorder Labs: No results found for: HGBA1C, MPG No results found for: PROLACTIN No results found for: CHOL, TRIG, HDL, CHOLHDL, VLDL, LDLCALC Lab Results  Component Value Date   TSH 2.14 02/15/2019    Therapeutic Level Labs: No results found for: LITHIUM No results found for: VALPROATE No components found for:  CBMZ  Current Medications: Current Outpatient Medications  Medication Sig Dispense Refill  . ARIPiprazole (ABILIFY) 5 MG tablet Take one tablet at hs 30 tablet 2  . clonazePAM (KLONOPIN) 1 MG tablet Take 1 tablet (1 mg total) by mouth 3 (three) times daily as needed for anxiety. 90 tablet 0  . fluticasone (FLONASE) 50 MCG/ACT nasal spray Place 2 sprays into both nostrils daily. 16 g 0   No current facility-administered medications for this visit.     Medication Side Effects: none  Orders placed this visit:  No orders of the defined types were placed in this encounter.   Psychiatric Specialty Exam:  Review of Systems  Neurological: Negative for tremors and weakness.  Psychiatric/Behavioral: The patient is nervous/anxious.     There were no vitals taken for this visit.There is no height or weight on file to calculate BMI.  General Appearance: Neat  Eye Contact:  Good  Speech:  Talkative  Volume:  Normal  Mood:  Anxious  Affect:  Appropriate  Thought Process:  Coherent   Orientation:  Full (Time, Place, and Person)  Thought Content: Logical   Suicidal Thoughts:  No  Homicidal Thoughts:  No  Memory:  WNL  Judgement:  Fair  Insight:  Fair  Psychomotor Activity:  Negative  Concentration:  Concentration: Fair  Recall:  Good  Fund of Knowledge: Fair  Language: Good  Assets:  Communication Skills Desire for Improvement Financial Resources/Insurance Housing Intimacy Leisure Time Physical Health Resilience Social Support Talents/Skills Transportation Vocational/Educational  ADL's:  Intact  Cognition: WNL  Prognosis:  Good   Screenings:  GAD-7     Office Visit from 02/15/2019 in [facility] Office Visit from 01/31/2019 in  [facility] Office Visit from 01/25/2019 in [facility]  Total GAD-7 Score  3  11  18     PHQ2-9     Office Visit from 01/25/2019 in [facility]  PHQ-2 Total Score  0      Receiving Psychotherapy: No   Treatment Plan/Recommendations:   Rule out BPD, IED, ADHD,OCD  Plan: 1. Increase Abilify from 5mg  to 7.5mg  daily to target mood instability. Will plan to escalate as needed/tolerated. 2. Increase Klonopin 1mg  TID to 4 times daily - explained this will be a temporay increase until anxiety more controlled - no substance issues. 3. Consider adding Depakote 125mg  BID 4. ADHD outside testing - tested 7 - adult ADHD in office today. Would not recommend treatment at this time with current mood instability. 5. Consider referral for psychological testing. 6. Will fill out forms for emotional support animal.   RTC 3 weeks  Discussed potential metabolic side effects associated with atypical antipsychotics, as well as potential risk for movement side effects. Advised pt to contact office if movement side effects occur.   Patient advised to contact office with any questions,  adverse effects, or acute worsening in signs and symptoms.  Aloha Gell, NP

## 2019-05-17 NOTE — Progress Notes (Signed)
Cardiology Office Note   Date:  05/18/2019   ID:  Jay Montgomery, Jay Montgomery [birth year], MRN [id number]  PCP:  Brunetta Jeans, PA-C  Cardiologist:   Minus Breeding, MD Referring:  Brunetta Jeans, PA-C  Chief Complaint  Patient presents with  . Loss of Consciousness      History of Present Illness: Jay Montgomery is a 33 y.o. male who is referred by Brunetta Jeans, PA-C for evaluation of syncope and bradycardia.   He had an episode of syncope in May.  He stood up in his house to go to work.  Everything went blank and he passed out.  He injured his elbow.  However, he had a work-up.  He went to the emergency room and I did review these records.  He had a normal echo in June.  Holter demonstrated occasional premature beats.  He did have a few episodes of bradycardia.  He had heart rate in the 30s.  This was thought to be vagal.  Head CT and lab work up was normal.  EKG demonstrated sinus rhythm rate 69, RSR prime V1 V2 no acute ST-T wave changes.  He was sent to me because a Holter demonstrated sinus bradycardia with rates in the 30s.  I reviewed the tracings with him.  He was occasionally tachycardic but his overall heart rate was in the 70s.  We reviewed what he was doing at that time as his heart rate would be high and low and it perfectly tracked with his activities at work or sleeping.  He does not really notice any palpitations other than when he gets agitated.  He has been diagnosed with panic and generalized anxiety and has not been medicated for this.  He denies any shortness of breath, PND or orthopnea.  He is having no new chest pressure, neck or arm discomfort.  He has had no weight gain or edema.   Past Medical History:  Diagnosis Date  . Anxiety   . Chicken pox   . Headache    due to CSF leak  . Heart murmur    "as a child"  . Spinal stenosis     Past Surgical History:  Procedure Laterality Date  . ADENOIDECTOMY    . BACK SURGERY  08/17/2017   L4-L5  .  LUMBAR LAMINECTOMY/DECOMPRESSION MICRODISCECTOMY N/A 12/01/2017   Procedure: REPAIR OF CEREBROSPINAL FLUID LEAK and Placement of Lumbar Drain;  Surgeon: Earnie Larsson, MD;  Location: [facility];  Service: Neurosurgery;  Laterality: N/A;  . TONSILLECTOMY       Current Outpatient Medications  Medication Sig Dispense Refill  . ARIPiprazole (ABILIFY) 5 MG tablet Take 7.5 mg by mouth daily.    . clonazePAM (KLONOPIN) 1 MG tablet Take 1 mg by mouth 4 (four) times daily as needed for anxiety.    . fluticasone (FLONASE) 50 MCG/ACT nasal spray Place 2 sprays into both nostrils daily as needed for allergies or rhinitis.     No current facility-administered medications for this visit.     Allergies:   Hydroxyzine and Vicodin [hydrocodone-acetaminophen]    Social History:  The patient  reports that he has been smoking cigarettes. He has been smoking about 1.00 pack per day. He has never used smokeless tobacco. He reports previous alcohol use. He reports that he does not use drugs.   Family History:  The patient's family history includes Arthritis in his father and mother; Bipolar disorder in his mother and sister; COPD in his mother;  Diabetes in his father; Emphysema in his mother; Hypertension in his mother; Schizophrenia in his sister.    ROS:  Please see the history of present illness.   Otherwise, review of systems are positive for none.   All other systems are reviewed and negative.    PHYSICAL EXAM: VS:  BP 120/76   Pulse 72   Ht 6\' 2"  (1.88 m)   Wt 200 lb 9.6 oz (91 kg)   BMI 25.76 kg/m  , BMI Body mass index is 25.76 kg/m. GENERAL:  Well appearing HEENT:  Pupils equal round and reactive, fundi not visualized, oral mucosa unremarkable NECK:  No jugular venous distention, waveform within normal limits, carotid upstroke brisk and symmetric, no bruits, no thyromegaly LYMPHATICS:  No cervical, inguinal adenopathy LUNGS:  Clear to auscultation bilaterally BACK:  No CVA tenderness CHEST:   Unremarkable HEART:  PMI not displaced or sustained,S1 and S2 within normal limits, no S3, no S4, no clicks, no rubs, no murmurs ABD:  Flat, positive bowel sounds normal in frequency in pitch, no bruits, no rebound, no guarding, no midline pulsatile mass, no hepatomegaly, no splenomegaly EXT:  2 plus pulses throughout, no edema, no cyanosis no clubbing SKIN:  No rashes no nodules NEURO:  Cranial nerves II through XII grossly intact, motor grossly intact throughout PSYCH:  Cognitively intact, oriented to person place and time    EKG:  EKG is not ordered today. See above   Recent Labs: 02/15/2019: BUN 8; Creatinine, Ser 0.99; Hemoglobin 15.4; Platelets 204.0; Potassium 3.8; Sodium 140; TSH 2.14    Lipid Panel No results found for: CHOL, TRIG, HDL, CHOLHDL, VLDL, LDLCALC, LDLDIRECT    Wt Readings from Last 3 Encounters:  05/18/19 200 lb 9.6 oz (91 kg)  02/15/19 214 lb (97.1 kg)  01/25/19 215 lb (97.5 kg)      Other studies Reviewed: Additional studies/ records that were reviewed today include: ED records, Holter, labs, EKG. Review of the above records demonstrates:  Please see elsewhere in the note.     ASSESSMENT AND PLAN:  SYNCOPE:   This was likely a vagal episode.  He had an extensive work-up.  No further work-up is suggested.  BRADYCARDIA:   He has sinus bradycardia but only during his sleeping hours.  He has a perfectly normal response.  No change in therapy is indicated.  Of note he did have normal electrolytes and TSH.  ANXIETY: We a long discussion about this and about healthy management.  That is getting medical management and he is exercising routinely and he always has.  We talked about other techniques of management.  TOBACCO: I gave him suggestions for quitting smoking.  Current medicines are reviewed at length with the patient today.  The patient does not have concerns regarding medicines.  The following changes have been made:  no change  Labs/ tests  ordered today include: None No orders of the defined types were placed in this encounter.    Disposition:   FU with as needed.      Signed, Minus Breeding, MD  05/18/2019 8:48 AM    [facility]

## 2019-05-18 ENCOUNTER — Ambulatory Visit: Payer: Managed Care, Other (non HMO) | Admitting: Cardiology

## 2019-05-18 ENCOUNTER — Other Ambulatory Visit: Payer: Self-pay

## 2019-05-18 ENCOUNTER — Encounter: Payer: Self-pay | Admitting: Cardiology

## 2019-05-18 VITALS — BP 120/76 | HR 72 | Ht 74.0 in | Wt 200.6 lb

## 2019-05-18 DIAGNOSIS — R001 Bradycardia, unspecified: Secondary | ICD-10-CM | POA: Insufficient documentation

## 2019-05-18 DIAGNOSIS — R55 Syncope and collapse: Secondary | ICD-10-CM | POA: Diagnosis not present

## 2019-05-18 NOTE — Patient Instructions (Signed)
Medication Instructions:  Your physician recommends that you continue on your current medications as directed. Please refer to the Current Medication list given to you today.  If you need a refill on your cardiac medications before your next appointment, please call your pharmacy.   Lab work: NONE  Testing/Procedures: NONE  Follow-Up: AS NEEDED   

## 2019-05-22 ENCOUNTER — Encounter: Payer: Self-pay | Admitting: Physician Assistant

## 2019-05-23 ENCOUNTER — Other Ambulatory Visit: Payer: Self-pay | Admitting: Adult Health

## 2019-05-23 DIAGNOSIS — F411 Generalized anxiety disorder: Secondary | ICD-10-CM

## 2019-05-23 DIAGNOSIS — F319 Bipolar disorder, unspecified: Secondary | ICD-10-CM

## 2019-05-23 DIAGNOSIS — F331 Major depressive disorder, recurrent, moderate: Secondary | ICD-10-CM

## 2019-05-23 MED ORDER — ARIPIPRAZOLE 5 MG PO TABS: 7.5000 mg | ORAL_TABLET | Freq: Every day | ORAL | 2 refills | Status: DC

## 2019-05-23 MED ORDER — CLONAZEPAM 1 MG PO TABS: 1.0000 mg | ORAL_TABLET | Freq: Four times a day (QID) | ORAL | 0 refills | Status: DC | PRN

## 2019-05-23 NOTE — Telephone Encounter (Signed)
Scripts resent

## 2019-05-23 NOTE — Telephone Encounter (Signed)
Patient called and said that he needs arefill on his abilify. On his last visit gina told him to increase to 7.5 mg and so he is out also he was told to increase his klonopin to 4 a day and is out of that as well. [facility] [address] [facility] Barnett Applebaum was suppose to send these in at last visit

## 2019-05-30 ENCOUNTER — Ambulatory Visit (INDEPENDENT_AMBULATORY_CARE_PROVIDER_SITE_OTHER): Payer: 59 | Admitting: Adult Health

## 2019-05-30 ENCOUNTER — Other Ambulatory Visit: Payer: Self-pay

## 2019-05-30 ENCOUNTER — Encounter: Payer: Self-pay | Admitting: Adult Health

## 2019-05-30 DIAGNOSIS — G4700 Insomnia, unspecified: Secondary | ICD-10-CM

## 2019-05-30 DIAGNOSIS — F411 Generalized anxiety disorder: Secondary | ICD-10-CM | POA: Diagnosis not present

## 2019-05-30 DIAGNOSIS — F909 Attention-deficit hyperactivity disorder, unspecified type: Secondary | ICD-10-CM

## 2019-05-30 DIAGNOSIS — F331 Major depressive disorder, recurrent, moderate: Secondary | ICD-10-CM

## 2019-05-30 DIAGNOSIS — F39 Unspecified mood [affective] disorder: Secondary | ICD-10-CM

## 2019-05-30 MED ORDER — ARIPIPRAZOLE 10 MG PO TABS: 10.0000 mg | ORAL_TABLET | Freq: Every day | ORAL | 2 refills | Status: DC

## 2019-05-30 MED ORDER — CLONAZEPAM 1 MG PO TABS: 1.0000 mg | ORAL_TABLET | Freq: Four times a day (QID) | ORAL | 2 refills | Status: DC | PRN

## 2019-05-30 MED ORDER — ARIPIPRAZOLE 5 MG PO TABS: 7.5000 mg | ORAL_TABLET | Freq: Every day | ORAL | 2 refills | Status: DC

## 2019-05-30 NOTE — Progress Notes (Signed)
[facility] MD/PA/NP Initial Note  05/30/2019 9:36 AM Jay Montgomery  MRN:  [id number]  Chief Complaint: Mood disorder, Anxiety, depression, ADHD, and Insomnia  HPI:   Describes mood today as "a lot better". Mood symptoms - feels anxious, and irritable - "but better".  Things are "going". 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 Does not feel like the at Abilify 7.5mg  is all the helpful. Helps him sleep better at night. Stating "last Saturday was the best day I've had in a while". Was able to race again. Had fun for the first time in a long time. Has had a few panic attacks. Got over excited about racing. Always has one on race day. Is able to control himself better. Gets bored easily. Has nothing to occupy my mind with. Currently out of work on [employer]. Feels a lot calmer than he used. Not wide opened, antsy, and fidgety. Not as aggravated and annoyed. Using punching bag. Improved interest and motivation. Taking medications as prescribed. Energy levels stable. Active, exercises 3 times a week at home. Working part-time. Gets frustrated when he has nothing to do.   Enjoys some usual interests. Spending time with family. Getting out more Appetite improved. Weight stable.   Sleep has improved. Averages 8 hours a day.  Focus and concentration difficulties. Completing tasks. Manages aspects of household.  Denies SI or HI. Denies AH or VH.  Medication trials: Xanax, Celexa, Valium  Visit Diagnosis:    ICD-10-CM   1. Mood disorder (HCC)  F39 ARIPiprazole (ABILIFY) 10 MG tablet  2. Insomnia, unspecified type  G47.00   3. Generalized anxiety disorder  F41.1 ARIPiprazole (ABILIFY) 10 MG tablet    clonazePAM (KLONOPIN) 1 MG tablet  4. Attention deficit hyperactivity disorder (ADHD), unspecified ADHD type  F90.9   5. Major depressive disorder, recurrent episode, moderate (HCC)  F33.1 ARIPiprazole (ABILIFY) 10 MG tablet     Past Psychiatric History:   Past Medical History:  Past Medical History:  Diagnosis Date  . Anxiety   . Chicken pox   . Headache    due to CSF leak  . Heart murmur    "as a child"  . Spinal stenosis     Past Surgical History:  Procedure Laterality Date  . ADENOIDECTOMY    . BACK SURGERY  08/17/2017   L4-L5  . LUMBAR LAMINECTOMY/DECOMPRESSION MICRODISCECTOMY N/A 12/01/2017   Procedure: REPAIR OF CEREBROSPINAL FLUID LEAK and Placement of Lumbar Drain;  Surgeon: Earnie Larsson, MD;  Location: [facility];  Service: Neurosurgery;  Laterality: N/A;  . TONSILLECTOMY      Family Psychiatric History: Mother - deceased 7 years ago COPD and emphysema. Was Bipolar - manic depressive. Sister 35 bipolar/Schizophrena.   Family History:  Family History  Problem Relation Age of Onset  . Arthritis Mother   . Hypertension Mother   . COPD Mother   . Emphysema Mother   . Bipolar disorder Mother   . Arthritis Father   . Diabetes Father   . Bipolar disorder Sister   . Schizophrenia Sister     Social History:  Social History   Socioeconomic History  . Marital status: Legally Separated    Spouse name: Not on file  . Number of children: Not on file  . Years of education: Not on file  . Highest education level: Not on file  Occupational History  . Occupation: Public house manager  Social Needs  .  Financial resource strain: Not on file  . Food insecurity    Worry: Not on file    Inability: Not on file  . Transportation needs    Medical: Not on file    Non-medical: Not on file  Tobacco Use  . Smoking status: Current Every Day Smoker    Packs/day: 1.00    Types: Cigarettes  . Smokeless tobacco: Never Used  Substance and Sexual Activity  . Alcohol use: Not Currently    Comment: occassional  . Drug use: No  . Sexual activity: Yes    Partners: Female    Comment: wife  Lifestyle  . Physical activity    Days per week: Not on file    Minutes per session: Not on file  . Stress: Not on file   Relationships  . Social Herbalist on phone: Not on file    Gets together: Not on file    Attends religious service: Not on file    Active member of club or organization: Not on file    Attends meetings of clubs or organizations: Not on file    Relationship status: Not on file  Other Topics Concern  . Not on file  Social History Narrative  . Not on file    Allergies:  Allergies  Allergen Reactions  . Hydroxyzine Other (See Comments)    Dizziness, mental fogginess, nausea  . Vicodin [Hydrocodone-Acetaminophen] Hives and Itching    Metabolic Disorder Labs: No results found for: HGBA1C, MPG No results found for: PROLACTIN No results found for: CHOL, TRIG, HDL, CHOLHDL, VLDL, LDLCALC Lab Results  Component Value Date   TSH 2.14 02/15/2019    Therapeutic Level Labs: No results found for: LITHIUM No results found for: VALPROATE No components found for:  CBMZ  Current Medications: Current Outpatient Medications  Medication Sig Dispense Refill  . ARIPiprazole (ABILIFY) 10 MG tablet Take 1 tablet (10 mg total) by mouth daily. 30 tablet 2  . clonazePAM (KLONOPIN) 1 MG tablet Take 1 tablet (1 mg total) by mouth 4 (four) times daily as needed for anxiety. 120 tablet 2  . fluticasone (FLONASE) 50 MCG/ACT nasal spray Place 2 sprays into both nostrils daily as needed for allergies or rhinitis.     No current facility-administered medications for this visit.     Medication Side Effects: none  Orders placed this visit:  No orders of the defined types were placed in this encounter.   Psychiatric Specialty Exam:  Review of Systems  Neurological: Negative for tremors and weakness.  Psychiatric/Behavioral: The patient is nervous/anxious.     There were no vitals taken for this visit.There is no height or weight on file to calculate BMI.  General Appearance: Neat  Eye Contact:  Good  Speech:  Talkative  Volume:  Normal  Mood:  Anxious  Affect:  Appropriate   Thought Process:  Coherent  Orientation:  Full (Time, Place, and Person)  Thought Content: Logical   Suicidal Thoughts:  No  Homicidal Thoughts:  No  Memory:  WNL  Judgement:  Fair  Insight:  Fair  Psychomotor Activity:  Negative  Concentration:  Concentration: Fair  Recall:  Good  Fund of Knowledge: Fair  Language: Good  Assets:  Communication Skills Desire for Improvement Financial Resources/Insurance Housing Intimacy Leisure Time Physical Health Resilience Social Support Talents/Skills Transportation Vocational/Educational  ADL's:  Intact  Cognition: WNL  Prognosis:  Good   Screenings:  GAD-7     Office Visit from 02/15/2019 in [facility] Office Visit from 01/31/2019 in [facility] Office Visit from 01/25/2019 in [facility]  Total GAD-7 Score  3  11  18     PHQ2-9     Office Visit from 01/25/2019 in [facility]  PHQ-2 Total Score  0      Receiving Psychotherapy: No   Treatment Plan/Recommendations:   Rule out BPD, IED, ADHD,OCD  Plan: Increase Abilify from 7.5mg  to 10mg  daily to target mood instability.  Continue Klonopin 1mg  TID to 4 times daily - explained this will be a temporay increase until anxiety more controlled - no substance issues.  ADHD outside testing - tested 84 - adult ADHD in office today. Would not recommend treatment at this time with current mood instability.  Consider referral for psychological testing.  Will fill out forms for emotional support animal.   Out ow work on FMLA through 06/17/2019  RTC 3 weeks  Discussed potential metabolic side effects associated with atypical antipsychotics, as well as potential risk for movement side effects. Advised pt to contact office if movement side effects occur.   Patient advised to contact office with any questions, adverse effects, or  acute worsening in signs and symptoms.  Aloha Gell, NP

## 2019-06-05 ENCOUNTER — Encounter: Payer: Self-pay | Admitting: Physician Assistant

## 2019-06-18 ENCOUNTER — Encounter: Payer: Self-pay | Admitting: Physician Assistant

## 2019-06-19 ENCOUNTER — Telehealth: Payer: Managed Care, Other (non HMO) | Admitting: Physician Assistant

## 2019-06-19 DIAGNOSIS — R059 Cough, unspecified: Secondary | ICD-10-CM

## 2019-06-19 DIAGNOSIS — R05 Cough: Secondary | ICD-10-CM

## 2019-06-19 DIAGNOSIS — J029 Acute pharyngitis, unspecified: Secondary | ICD-10-CM

## 2019-06-19 MED ORDER — FLUTICASONE PROPIONATE 50 MCG/ACT NA SUSP: 2.0000 | Freq: Every day | NASAL | 0 refills | Status: DC | PRN

## 2019-06-19 MED ORDER — BENZONATATE 100 MG PO CAPS: 100.0000 mg | ORAL_CAPSULE | Freq: Three times a day (TID) | ORAL | 0 refills | Status: DC | PRN

## 2019-06-19 NOTE — Progress Notes (Signed)
We are sorry you are not feeling well.  Here is how we plan to help! ° °Based on what you have shared with me, it looks like you may have a viral upper respiratory infection.  Upper respiratory infections are caused by a large number of viruses; however, rhinovirus is the most common cause.  ° °Symptoms vary from person to person, with common symptoms including sore throat, cough, fatigue or lack of energy and feeling of general discomfort.  A low-grade fever of up to 100.4 may present, but is often uncommon.  Symptoms vary however, and are closely related to a person's age or underlying illnesses.  The most common symptoms associated with an upper respiratory infection are nasal discharge or congestion, cough, sneezing, headache and pressure in the ears and face.  These symptoms usually persist for about 3 to 10 days, but can last up to 2 weeks.  It is important to know that upper respiratory infections do not cause serious illness or complications in most cases.   ° °Upper respiratory infections can be transmitted from person to person, with the most common method of transmission being a person's hands.  The virus is able to live on the skin and can infect other persons for up to 2 hours after direct contact.  Also, these can be transmitted when someone coughs or sneezes; thus, it is important to cover the mouth to reduce this risk.  To keep the spread of the illness at bay, good hand hygiene is very important. ° °This is an infection that is most likely caused by a virus. There are no specific treatments other than to help you with the symptoms until the infection runs its course.  We are sorry you are not feeling well.  Here is how we plan to help! ° ° °For nasal congestion, you may use an oral decongestants such as Mucinex D or if you have glaucoma or high blood pressure use plain Mucinex.  Saline nasal spray or nasal drops can help and can safely be used as often as needed for congestion.  For your congestion,  I have prescribed Fluticasone nasal spray one spray in each nostril twice a day ° °If you do not have a history of heart disease, hypertension, diabetes or thyroid disease, prostate/bladder issues or glaucoma, you may also use Sudafed to treat nasal congestion.  It is highly recommended that you consult with a pharmacist or your primary care physician to ensure this medication is safe for you to take.    ° °If you have a cough, you may use cough suppressants such as Delsym and Robitussin.  If you have glaucoma or high blood pressure, you can also use Coricidin HBP.   °For cough I have prescribed for you A prescription cough medication called Tessalon Perles 100 mg. You may take 1-2 capsules every 8 hours as needed for cough ° °If you have a sore or scratchy throat, use a saltwater gargle- ¼ to ½ teaspoon of salt dissolved in a 4-ounce to 8-ounce glass of warm water.  Gargle the solution for approximately 15-30 seconds and then spit.  It is important not to swallow the solution.  You can also use throat lozenges/cough drops and Chloraseptic spray to help with throat pain or discomfort.  Warm or cold liquids can also be helpful in relieving throat pain. ° °For headache, pain or general discomfort, you can use Ibuprofen or Tylenol as directed.   °Some authorities believe that zinc sprays or the use of   Echinacea may shorten the course of your symptoms. ° ° °HOME CARE °• Only take medications as instructed by your medical team. °• Be sure to drink plenty of fluids. Water is fine as well as fruit juices, sodas and electrolyte beverages. You may want to stay away from caffeine or alcohol. If you are nauseated, try taking small sips of liquids. How do you know if you are getting enough fluid? Your urine should be a pale yellow or almost colorless. °• Get rest. °• Taking a steamy shower or using a humidifier may help nasal congestion and ease sore throat pain. You can place a towel over your head and breathe in the steam  from hot water coming from a faucet. °• Using a saline nasal spray works much the same way. °• Cough drops, hard candies and sore throat lozenges may ease your cough. °• Avoid close contacts especially the very young and the elderly °• Cover your mouth if you cough or sneeze °• Always remember to wash your hands.  ° °GET HELP RIGHT AWAY IF: °• You develop worsening fever. °• If your symptoms do not improve within 10 days °• You develop yellow or green discharge from your nose over 3 days. °• You have coughing fits °• You develop a severe head ache or visual changes. °• You develop shortness of breath, difficulty breathing or start having chest pain °• Your symptoms persist after you have completed your treatment plan ° °MAKE SURE YOU  °· Understand these instructions. °· Will watch your condition. °· Will get help right away if you are not doing well or get worse. ° °Your e-visit answers were reviewed by a board certified advanced clinical practitioner to complete your personal care plan. Depending upon the condition, your plan could have included both over the counter or prescription medications. °Please review your pharmacy choice. If there is a problem, you may call our nursing hot line at and have the prescription routed to another pharmacy. °Your safety is important to us. If you have drug allergies check your prescription carefully.  ° °You can use MyChart to ask questions about today’s visit, request a non-urgent call back, or ask for a work or school excuse for 24 hours related to this e-Visit. If it has been greater than 24 hours you will need to follow up with your provider, or enter a new e-Visit to address those concerns. °You will get an e-mail in the next two days asking about your experience.  I hope that your e-visit has been valuable and will speed your recovery. Thank you for using e-visits. ° ° ° ° °Greater than 5 minutes, yet less than 10 minutes of time have been spent researching, coordinating  and implementing care for this patient today.  ° °

## 2019-06-20 ENCOUNTER — Other Ambulatory Visit: Payer: Self-pay

## 2019-06-20 ENCOUNTER — Ambulatory Visit (INDEPENDENT_AMBULATORY_CARE_PROVIDER_SITE_OTHER): Payer: 59 | Admitting: Adult Health

## 2019-06-20 ENCOUNTER — Encounter: Payer: Self-pay | Admitting: Adult Health

## 2019-06-20 DIAGNOSIS — G4700 Insomnia, unspecified: Secondary | ICD-10-CM | POA: Diagnosis not present

## 2019-06-20 DIAGNOSIS — F411 Generalized anxiety disorder: Secondary | ICD-10-CM

## 2019-06-20 DIAGNOSIS — F39 Unspecified mood [affective] disorder: Secondary | ICD-10-CM | POA: Diagnosis not present

## 2019-06-20 DIAGNOSIS — F909 Attention-deficit hyperactivity disorder, unspecified type: Secondary | ICD-10-CM

## 2019-06-20 DIAGNOSIS — F319 Bipolar disorder, unspecified: Secondary | ICD-10-CM

## 2019-06-20 NOTE — Progress Notes (Signed)
[facility] MD/PA/NP Initial Note  06/20/2019 10:04 AM Jay Montgomery  MRN:  [id number]  Chief Complaint: Mood disorder, Anxiety, depression, ADHD, and Insomnia  HPI:   Describes mood today as "ok". Mood symptoms - decreased anxiety, depression, and irritability. Stating "I'm doing a lot better". Moods more "level". Has decreased Clonazepam from 4 to 3 a day. Went back to work Sunday, September 20th. Forgot to take Abilify one day and noticed a big difference without it. Otherwise, has been compliant with medications. Plans to start racing again. Has a race this weekend in [place] [facility]. Improved interest and motivation. Taking medications as prescribed. Energy levels stable. Active, exercises 3 times a week at home. Working full-time - 40 hours.  Enjoys some usual interests. Spending time with family - fiance and children. Family local.  Appetite improved. Weight stable.   Sleeps well most nights.  Averages 8 hours.  Focus and concentration has improved overall - still very distracted - stating "I'm like a squirrel". Completing tasks. Manages aspects of household. Working going well - 4p to 1:30a.  Denies SI or HI. Denies AH or VH.  Medication trials: Xanax, Celexa, Valium  Visit Diagnosis:    ICD-10-CM   1. Mood disorder ([place])  F39   2. Insomnia, unspecified type  G47.00   3. Generalized anxiety disorder  F41.1   4. Attention deficit hyperactivity disorder (ADHD), unspecified ADHD type  F90.9   5. Bipolar I disorder ([place])  F31.9     Past Psychiatric History:   Past Medical History:  Past Medical History:  Diagnosis Date  . Anxiety   . Chicken pox   . Headache    due to CSF leak  . Heart murmur    "as a child"  . Spinal stenosis     Past Surgical History:  Procedure Laterality Date  . ADENOIDECTOMY    . BACK SURGERY  08/17/2017   L4-L5  . LUMBAR LAMINECTOMY/DECOMPRESSION MICRODISCECTOMY N/A 12/01/2017   Procedure: REPAIR OF CEREBROSPINAL FLUID LEAK and Placement of Lumbar Drain;   Surgeon: Earnie Larsson, MD;  Location: [facility];  Service: Neurosurgery;  Laterality: N/A;  . TONSILLECTOMY      Family Psychiatric History: Mother - deceased 7 years ago COPD and emphysema. Was Bipolar - manic depressive. Sister 11 bipolar/Schizophrena.   Family History:  Family History  Problem Relation Age of Onset  . Arthritis Mother   . Hypertension Mother   . COPD Mother   . Emphysema Mother   . Bipolar disorder Mother   . Arthritis Father   . Diabetes Father   . Bipolar disorder Sister   . Schizophrenia Sister     Social History:  Social History   Socioeconomic History  . Marital status: Legally Separated    Spouse name: Not on file  . Number of children: Not on file  . Years of education: Not on file  . Highest education level: Not on file  Occupational History  . Occupation: Public house manager  Social Needs  . Financial resource strain: Not on file  . Food insecurity    Worry: Not on file    Inability: Not on file  . Transportation needs    Medical: Not on file    Non-medical: Not on file  Tobacco Use  . Smoking status: Current Every Day Smoker    Packs/day: 1.00    Types: Cigarettes  . Smokeless tobacco: Never Used  Substance and Sexual Activity  . Alcohol use: Not Currently    Comment: occassional  .  Drug use: No  . Sexual activity: Yes    Partners: Female    Comment: wife  Lifestyle  . Physical activity    Days per week: Not on file    Minutes per session: Not on file  . Stress: Not on file  Relationships  . Social Herbalist on phone: Not on file    Gets together: Not on file    Attends religious service: Not on file    Active member of club or organization: Not on file    Attends meetings of clubs or organizations: Not on file    Relationship status: Not on file  Other Topics Concern  . Not on file  Social History Narrative  . Not on file    Allergies:  Allergies  Allergen Reactions  . Hydroxyzine Other (See Comments)     Dizziness, mental fogginess, nausea  . Vicodin [Hydrocodone-Acetaminophen] Hives and Itching    Metabolic Disorder Labs: No results found for: HGBA1C, MPG No results found for: PROLACTIN No results found for: CHOL, TRIG, HDL, CHOLHDL, VLDL, LDLCALC Lab Results  Component Value Date   TSH 2.14 02/15/2019    Therapeutic Level Labs: No results found for: LITHIUM No results found for: VALPROATE No components found for:  CBMZ  Current Medications: Current Outpatient Medications  Medication Sig Dispense Refill  . ARIPiprazole (ABILIFY) 10 MG tablet Take 1 tablet (10 mg total) by mouth daily. 30 tablet 2  . benzonatate (TESSALON) 100 MG capsule Take 1-2 capsules (100-200 mg total) by mouth 3 (three) times daily as needed for cough. 40 capsule 0  . clonazePAM (KLONOPIN) 1 MG tablet Take 1 tablet (1 mg total) by mouth 4 (four) times daily as needed for anxiety. 120 tablet 2  . fluticasone (FLONASE) 50 MCG/ACT nasal spray Place 2 sprays into both nostrils daily as needed for allergies or rhinitis. 9.9 mL 0   No current facility-administered medications for this visit.     Medication Side Effects: none  Orders placed this visit:  No orders of the defined types were placed in this encounter.   Psychiatric Specialty Exam:  Review of Systems  Neurological: Negative for tremors and weakness.    There were no vitals taken for this visit.There is no height or weight on file to calculate BMI.  General Appearance: Neat  Eye Contact:  Good  Speech:  Talkative  Volume:  Normal  Mood:  Euthymic  Affect:  Appropriate  Thought Process:  Coherent  Orientation:  Full (Time, Place, and Person)  Thought Content: Logical   Suicidal Thoughts:  No  Homicidal Thoughts:  No  Memory:  WNL  Judgement:  Fair  Insight:  Fair  Psychomotor Activity:  Negative  Concentration:  Concentration: Fair  Recall:  Good  Fund of Knowledge: Fair  Language: Good  Assets:  Communication Skills Desire for  Improvement Financial Resources/Insurance Housing Intimacy Leisure Time Physical Health Resilience Social Support Talents/Skills Transportation Vocational/Educational  ADL's:  Intact  Cognition: WNL  Prognosis:  Good   Screenings:  GAD-7     Office Visit from 02/15/2019 in [facility] Office Visit from 01/31/2019 in [facility] Office Visit from 01/25/2019 in [facility]  Total GAD-7 Score  3  11  18     PHQ2-9     Office Visit from 01/25/2019 in [facility]  PHQ-2 Total Score  0      Receiving Psychotherapy: No  Treatment Plan/Recommendations:   Rule out BPD, IED, ADHD,OCD  Plan: Abilify 10mg  daily to target mood instability.  Klonopin 1mg  TID to 4 times daily - continue for now - explained this will be a temporay increase until anxiety more controlled - no substance issues.  Add low dose Adderall next visit  ADHD outside testing - tested 37 - adult ADHD in office today. Would not recommend treatment at this time with current mood instability.  Consider referral for psychological testing.  Will fill out forms for emotional support animal.   RTC 4 weeks  Discussed potential metabolic side effects associated with atypical antipsychotics, as well as potential risk for movement side effects. Advised pt to contact office if movement side effects occur.   Patient advised to contact office with any questions, adverse effects, or acute worsening in signs and symptoms.  Aloha Gell, NP

## 2019-06-27 ENCOUNTER — Encounter: Payer: Self-pay | Admitting: Physician Assistant

## 2019-06-27 ENCOUNTER — Other Ambulatory Visit: Payer: Self-pay

## 2019-06-27 ENCOUNTER — Ambulatory Visit (INDEPENDENT_AMBULATORY_CARE_PROVIDER_SITE_OTHER): Payer: Managed Care, Other (non HMO) | Admitting: Physician Assistant

## 2019-06-27 VITALS — Temp 100.0°F

## 2019-06-27 DIAGNOSIS — Z20822 Contact with and (suspected) exposure to covid-19: Secondary | ICD-10-CM

## 2019-06-27 DIAGNOSIS — J0100 Acute maxillary sinusitis, unspecified: Secondary | ICD-10-CM

## 2019-06-27 DIAGNOSIS — Z20828 Contact with and (suspected) exposure to other viral communicable diseases: Secondary | ICD-10-CM

## 2019-06-27 MED ORDER — AMOXICILLIN-POT CLAVULANATE 875-125 MG PO TABS: 1.0000 | ORAL_TABLET | Freq: Two times a day (BID) | ORAL | 0 refills | Status: DC

## 2019-06-27 NOTE — Progress Notes (Signed)
   Virtual Visit via Video   I connected with patient on 06/27/19 at  3:30 PM EDT by a video enabled telemedicine application and verified that I am speaking with the correct person using two identifiers.  Location patient: Home Location provider: [facility] Office Persons participating in the virtual visit: Patient, Provider, [place] (Patina Moore)  I discussed the limitations of evaluation and management by telemedicine and the availability of in person appointments. The patient expressed understanding and agreed to proceed.  Subjective:   HPI:   Patient presents via [facility] today c/o 3-4 days of nasal congestion, cough, watery eyes. Now with fever, Tmax 100.2, maxillary sinus pain and frontal sinus tenderness. Also has body aches and chills since onset.   Had 1 episode of emesis this morning after a hot cup of coffee. Non-bloody. None since then. Has been able to keep down food and fluids. Denies diarrhea. Has been taking his Flonase and old script for tessalon that is in date.  ROS:   See pertinent positives and negatives per HPI.  Patient Active Problem List   Diagnosis Date Noted  . Bradycardia 05/18/2019  . Syncope and collapse 05/18/2019  . GAD (generalized anxiety disorder) 01/25/2019  . Postoperative CSF leak 12/01/2017  . Spinal stenosis of lumbar region 02/15/2017    Social History   Tobacco Use  . Smoking status: Current Every Day Smoker    Packs/day: 1.00    Types: Cigarettes  . Smokeless tobacco: Never Used  Substance Use Topics  . Alcohol use: Not Currently    Comment: occassional    Current Outpatient Medications:  .  ARIPiprazole (ABILIFY) 10 MG tablet, Take 1 tablet (10 mg total) by mouth daily., Disp: 30 tablet, Rfl: 2 .  benzonatate (TESSALON) 100 MG capsule, Take 1-2 capsules (100-200 mg total) by mouth 3 (three) times daily as needed for cough., Disp: 40 capsule, Rfl: 0 .  clonazePAM (KLONOPIN) 1 MG tablet, Take 1 tablet (1 mg total) by mouth  4 (four) times daily as needed for anxiety., Disp: 120 tablet, Rfl: 2 .  fluticasone (FLONASE) 50 MCG/ACT nasal spray, Place 2 sprays into both nostrils daily as needed for allergies or rhinitis., Disp: 9.9 mL, Rfl: 0  Allergies  Allergen Reactions  . Hydroxyzine Other (See Comments)    Dizziness, mental fogginess, nausea  . Vicodin [Hydrocodone-Acetaminophen] Hives and Itching    Objective:   Temp 100 F (37.8 C) (Temporal)   Patient is well-developed, well-nourished in no acute distress.  Resting comfortably at home.  Head is normocephalic, atraumatic.  No labored breathing.  Speech is clear and coherent with logical contest.  Patient is alert and oriented at baseline.  + TTP R frontal sinuses (patient-performed)  Assessment and Plan:   1. Suspected COVID-19 virus infection Sent for testing. HE is to quarantine until results are in. Patient enrolled in symptom monitoring program. Supportive measures and OTC medications reviewed. ER precautions reviewed. Tx for secondary maxillary sinusitis as noted below.   2. Acute non-recurrent maxillary sinusitis Rx Augmentin.  Increase fluids.  Rest.  Saline nasal spray.  Probiotic.  Mucinex as directed.  Humidifier in bedroom. Continue Flonase and antihistamine.  Call or return to clinic if symptoms are not improving.     [name], PA-C 06/27/2019

## 2019-06-27 NOTE — Progress Notes (Signed)
I have discussed the procedure for the virtual visit with the patient who has given consent to proceed with assessment and treatment.   Jamilex Bohnsack S Pearley Baranek, CMA     

## 2019-06-27 NOTE — Patient Instructions (Signed)
Instructions sent to MyChart.  Please go to [facility] for Covid testing. They are open M-F 8-3. This is a drive-thru testing. Quarantine until results are in.  Please take antibiotic as directed.  Increase fluid intake.  Use Saline nasal spray.  Take a daily multivitamin. Continue Flonase and Tessalon.  Place a humidifier in the bedroom.  Please call or return clinic if symptoms are not improving.  Sinusitis Sinusitis is redness, soreness, and swelling (inflammation) of the paranasal sinuses. Paranasal sinuses are air pockets within the bones of your face (beneath the eyes, the middle of the forehead, or above the eyes). In healthy paranasal sinuses, mucus is able to drain out, and air is able to circulate through them by way of your nose. However, when your paranasal sinuses are inflamed, mucus and air can become trapped. This can allow bacteria and other germs to grow and cause infection. Sinusitis can develop quickly and last only a short time (acute) or continue over a long period (chronic). Sinusitis that lasts for more than 12 weeks is considered chronic.  CAUSES  Causes of sinusitis include:  Allergies.  Structural abnormalities, such as displacement of the cartilage that separates your nostrils (deviated septum), which can decrease the air flow through your nose and sinuses and affect sinus drainage.  Functional abnormalities, such as when the small hairs (cilia) that line your sinuses and help remove mucus do not work properly or are not present. SYMPTOMS  Symptoms of acute and chronic sinusitis are the same. The primary symptoms are pain and pressure around the affected sinuses. Other symptoms include:  Upper toothache.  Earache.  Headache.  Bad breath.  Decreased sense of smell and taste.  A cough, which worsens when you are lying flat.  Fatigue.  Fever.  Thick drainage from your nose, which often is green and may contain pus (purulent).  Swelling and warmth  over the affected sinuses. DIAGNOSIS  Your caregiver will perform a physical exam. During the exam, your caregiver may:  Look in your nose for signs of abnormal growths in your nostrils (nasal polyps).  Tap over the affected sinus to check for signs of infection.  View the inside of your sinuses (endoscopy) with a special imaging device with a light attached (endoscope), which is inserted into your sinuses. If your caregiver suspects that you have chronic sinusitis, one or more of the following tests may be recommended:  Allergy tests.  Nasal culture A sample of mucus is taken from your nose and sent to a lab and screened for bacteria.  Nasal cytology A sample of mucus is taken from your nose and examined by your caregiver to determine if your sinusitis is related to an allergy. TREATMENT  Most cases of acute sinusitis are related to a viral infection and will resolve on their own within 10 days. Sometimes medicines are prescribed to help relieve symptoms (pain medicine, decongestants, nasal steroid sprays, or saline sprays).  However, for sinusitis related to a bacterial infection, your caregiver will prescribe antibiotic medicines. These are medicines that will help kill the bacteria causing the infection.  Rarely, sinusitis is caused by a fungal infection. In theses cases, your caregiver will prescribe antifungal medicine. For some cases of chronic sinusitis, surgery is needed. Generally, these are cases in which sinusitis recurs more than 3 times per year, despite other treatments. HOME CARE INSTRUCTIONS   Drink plenty of water. Water helps thin the mucus so your sinuses can drain more easily.  Use a  humidifier.  Inhale steam 3 to 4 times a day (for example, sit in the bathroom with the shower running).  Apply a warm, moist washcloth to your face 3 to 4 times a day, or as directed by your caregiver.  Use saline nasal sprays to help moisten and clean your sinuses.  Take  over-the-counter or prescription medicines for pain, discomfort, or fever only as directed by your caregiver. SEEK IMMEDIATE MEDICAL CARE IF:  You have increasing pain or severe headaches.  You have nausea, vomiting, or drowsiness.  You have swelling around your face.  You have vision problems.  You have a stiff neck.  You have difficulty breathing. MAKE SURE YOU:   Understand these instructions.  Will watch your condition.  Will get help right away if you are not doing well or get worse. Document Released: 09/06/2005 Document Revised: 11/29/2011 Document Reviewed: 09/21/2011 [facility] Patient Information 2014 [place], [place].

## 2019-06-28 ENCOUNTER — Other Ambulatory Visit: Payer: Self-pay

## 2019-06-28 ENCOUNTER — Encounter (INDEPENDENT_AMBULATORY_CARE_PROVIDER_SITE_OTHER): Payer: Self-pay

## 2019-06-28 DIAGNOSIS — Z20822 Contact with and (suspected) exposure to covid-19: Secondary | ICD-10-CM

## 2019-06-29 ENCOUNTER — Telehealth: Payer: Self-pay

## 2019-06-29 ENCOUNTER — Encounter (INDEPENDENT_AMBULATORY_CARE_PROVIDER_SITE_OTHER): Payer: Self-pay

## 2019-06-29 LAB — NOVEL CORONAVIRUS, NAA: SARS-CoV-2, NAA: NOT DETECTED

## 2019-06-29 NOTE — Telephone Encounter (Signed)
Patient has been advise per protocol for diarrhea:  If diarrhea remains the same: encourage patient to drink oral fluids and bland foods.   Avoid alcohol, spicy foods, caffeine or fatty foods that could make diarrhea worse.   Continue to monitor for signs of dehydration (increased thirst decreased urine output, yellow urine, dry skin, headache or dizziness).   Advise patient to try OTC medication (Imodium, kaopectate, Pepto-Bismol) as per manufacturer's instructions.  If worsening diarrhea occurs and becomes severe (6-7 bowel movements a day): notify PCP   If diarrhea last greater than 7 days: notify PCP   IF SIGNS OF DEHYDRATION OCCUR (INCREASED THIRST, DECREASED URINE OUTPUT, YELLOW URINE, DRY SKIN, HEADACHE OR DIZZINESS) ADVISE PATIENT TO CALL 911 AND SEEK TREATMENT IN THE ED  Patient states that his diarrhea started this morning. Patient will continue to monitor symptoms. Patient verbalized understanding and agrees with plan.

## 2019-06-30 ENCOUNTER — Encounter (INDEPENDENT_AMBULATORY_CARE_PROVIDER_SITE_OTHER): Payer: Self-pay

## 2019-07-01 ENCOUNTER — Encounter (INDEPENDENT_AMBULATORY_CARE_PROVIDER_SITE_OTHER): Payer: Self-pay

## 2019-07-02 ENCOUNTER — Encounter (INDEPENDENT_AMBULATORY_CARE_PROVIDER_SITE_OTHER): Payer: Self-pay

## 2019-07-02 ENCOUNTER — Telehealth: Payer: Self-pay | Admitting: Physician Assistant

## 2019-07-02 NOTE — Telephone Encounter (Signed)
Pt called in asking for a returned to work note. States that he is still having a cough and runny nose but believes this is allergy related. Pt has not had a fever since Friday. Please advise. Pt can be reached at the home # pt states you can send the letter via mychart.

## 2019-07-02 NOTE — Telephone Encounter (Signed)
Sine he is 0 days out from onset of symptoms with negative COVID testing and fever free for Friday, he may return to work this evening. Note written and sent to MyChart.

## 2019-07-03 ENCOUNTER — Encounter (INDEPENDENT_AMBULATORY_CARE_PROVIDER_SITE_OTHER): Payer: Self-pay

## 2019-07-09 ENCOUNTER — Encounter (INDEPENDENT_AMBULATORY_CARE_PROVIDER_SITE_OTHER): Payer: Self-pay

## 2019-07-18 ENCOUNTER — Ambulatory Visit (INDEPENDENT_AMBULATORY_CARE_PROVIDER_SITE_OTHER): Payer: 59 | Admitting: Adult Health

## 2019-07-18 ENCOUNTER — Ambulatory Visit: Payer: 59 | Admitting: Adult Health

## 2019-07-18 ENCOUNTER — Other Ambulatory Visit: Payer: Self-pay

## 2019-07-18 ENCOUNTER — Encounter: Payer: Self-pay | Admitting: Adult Health

## 2019-07-18 DIAGNOSIS — F331 Major depressive disorder, recurrent, moderate: Secondary | ICD-10-CM | POA: Diagnosis not present

## 2019-07-18 DIAGNOSIS — F319 Bipolar disorder, unspecified: Secondary | ICD-10-CM | POA: Diagnosis not present

## 2019-07-18 DIAGNOSIS — G4700 Insomnia, unspecified: Secondary | ICD-10-CM | POA: Diagnosis not present

## 2019-07-18 DIAGNOSIS — F909 Attention-deficit hyperactivity disorder, unspecified type: Secondary | ICD-10-CM

## 2019-07-18 DIAGNOSIS — F411 Generalized anxiety disorder: Secondary | ICD-10-CM | POA: Diagnosis not present

## 2019-07-18 MED ORDER — AMPHETAMINE-DEXTROAMPHETAMINE 20 MG PO TABS: 20.0000 mg | ORAL_TABLET | Freq: Every day | ORAL | 0 refills | Status: DC
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
## 2019-08-14 ENCOUNTER — Encounter: Payer: Self-pay | Admitting: Adult Health

## 2019-08-14 ENCOUNTER — Ambulatory Visit (INDEPENDENT_AMBULATORY_CARE_PROVIDER_SITE_OTHER): Payer: 59 | Admitting: Adult Health

## 2019-08-14 ENCOUNTER — Other Ambulatory Visit: Payer: Self-pay

## 2019-08-14 DIAGNOSIS — F909 Attention-deficit hyperactivity disorder, unspecified type: Secondary | ICD-10-CM

## 2019-08-14 DIAGNOSIS — F411 Generalized anxiety disorder: Secondary | ICD-10-CM | POA: Diagnosis not present

## 2019-08-14 DIAGNOSIS — F331 Major depressive disorder, recurrent, moderate: Secondary | ICD-10-CM

## 2019-08-14 DIAGNOSIS — F319 Bipolar disorder, unspecified: Secondary | ICD-10-CM

## 2019-08-14 DIAGNOSIS — F39 Unspecified mood [affective] disorder: Secondary | ICD-10-CM

## 2019-08-14 DIAGNOSIS — G4700 Insomnia, unspecified: Secondary | ICD-10-CM

## 2019-08-14 MED ORDER — CLONAZEPAM 1 MG PO TABS: 1.0000 mg | ORAL_TABLET | Freq: Four times a day (QID) | ORAL | 2 refills | Status: DC | PRN

## 2019-08-14 MED ORDER — ARIPIPRAZOLE 10 MG PO TABS: 10.0000 mg | ORAL_TABLET | Freq: Every day | ORAL | 5 refills | Status: DC

## 2019-08-14 MED ORDER — AMPHETAMINE-DEXTROAMPHETAMINE 20 MG PO TABS: 20.0000 mg | ORAL_TABLET | Freq: Two times a day (BID) | ORAL | 0 refills | Status: DC

## 2019-08-14 NOTE — Progress Notes (Signed)
[facility] MD/PA/NP Medication Check  08/14/2019 3:21 PM Jay Montgomery  MRN:  [id number]  Chief Complaint: Mood disorder, Anxiety, depression, ADHD, and Insomnia  HPI:   Describes mood today as "ok". Mood symptoms - denies depression. Anxious "sometimes". More irritable "overall". Feels like "we are on the right track with his medications". Feels more "tolerable" of things until Adderall wears off.  Stating "I don't know if it's people I can't stand, or what". Gets aggravated when people don't understand things. 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 Stating "working with people is my issue". Feels like addition of Adderall has been helpful. States "I think it helps keep me sit down and do things. Can sit down and work on one thing until it's completed. Cleaning house and making sure things are in "order". Improved interest and motivation. Taking medications as prescribed. Energy levels stable. Active, exercises 3 days a week. 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  Enjoys some usual interests. Spending time with family - fiance and children. Family local. Going to father's for Thanksgiving.  Appetite stable. Weight stable. Did not want to "eat initially, then it passed".   Sleeps well most nights. Averages 6 hours. Some nights with "broken sleep". Focus and concentration improved with addition of Adderall. Completing tasks. Manages aspects of household. Doing better in work setting. Denies SI or HI. Denies AH or VH.  Medication trials: Xanax, Celexa, Valium  Visit Diagnosis:    ICD-10-CM   1. Insomnia, unspecified type  G47.00   2. Generalized anxiety disorder  F41.1 clonazePAM (KLONOPIN) 1 MG tablet    ARIPiprazole (ABILIFY) 10 MG tablet  3. Major depressive disorder, recurrent episode, moderate (HCC)  F33.1 ARIPiprazole (ABILIFY) 10 MG tablet  4. Bipolar I disorder ([place])  F31.9   5. Attention deficit hyperactivity disorder (ADHD),  unspecified ADHD type  F90.9 amphetamine-dextroamphetamine (ADDERALL) 20 MG tablet  6. Mood disorder (HCC)  F39 ARIPiprazole (ABILIFY) 10 MG tablet    Past Psychiatric History: Denies psychiatric hospitalizations.   Past Medical History:  Past Medical History:  Diagnosis Date  . Anxiety   . Chicken pox   . Headache    due to CSF leak  . Heart murmur    "as a child"  . Spinal stenosis     Past Surgical History:  Procedure Laterality Date  . ADENOIDECTOMY    . BACK SURGERY  08/17/2017   L4-L5  . LUMBAR LAMINECTOMY/DECOMPRESSION MICRODISCECTOMY N/A 12/01/2017   Procedure: REPAIR OF CEREBROSPINAL FLUID LEAK and Placement of Lumbar Drain;  Surgeon: Earnie Larsson, MD;  Location: [facility];  Service: Neurosurgery;  Laterality: N/A;  . TONSILLECTOMY      Family Psychiatric History: Mother - deceased 7 years ago COPD and emphysema. Was Bipolar - manic depressive. Sister 75 bipolar/Schizophrena.   Family History:  Family History  Problem Relation Age of Onset  . Arthritis Mother   . Hypertension Mother   . COPD Mother   . Emphysema Mother   . Bipolar disorder Mother   . Arthritis Father   . Diabetes Father   . Bipolar disorder Sister   . Schizophrenia Sister     Social History:  Social History   Socioeconomic History  . Marital status: Legally Separated    Spouse name: Not on file  . Number of children: Not on file  . Years of education: Not on file  . Highest education level: Not on file  Occupational History  . Occupation:  [employer]  Social Needs  . Financial resource strain: Not on file  . Food insecurity    Worry: Not on file    Inability: Not on file  . Transportation needs    Medical: Not on file    Non-medical: Not on file  Tobacco Use  . Smoking status: Current Every Day Smoker    Packs/day: 1.00    Types: Cigarettes  . Smokeless tobacco: Never Used  Substance and Sexual Activity  . Alcohol use: Not Currently    Comment: occassional  . Drug use: No  .  Sexual activity: Yes    Partners: Female    Comment: wife  Lifestyle  . Physical activity    Days per week: Not on file    Minutes per session: Not on file  . Stress: Not on file  Relationships  . Social Herbalist on phone: Not on file    Gets together: Not on file    Attends religious service: Not on file    Active member of club or organization: Not on file    Attends meetings of clubs or organizations: Not on file    Relationship status: Not on file  Other Topics Concern  . Not on file  Social History Narrative  . Not on file    Allergies:  Allergies  Allergen Reactions  . Hydroxyzine Other (See Comments)    Dizziness, mental fogginess, nausea  . Vicodin [Hydrocodone-Acetaminophen] Hives and Itching    Metabolic Disorder Labs: No results found for: HGBA1C, MPG No results found for: PROLACTIN No results found for: CHOL, TRIG, HDL, CHOLHDL, VLDL, LDLCALC Lab Results  Component Value Date   TSH 2.14 02/15/2019    Therapeutic Level Labs: No results found for: LITHIUM No results found for: VALPROATE No components found for:  CBMZ  Current Medications: Current Outpatient Medications  Medication Sig Dispense Refill  . amoxicillin-clavulanate (AUGMENTIN) 875-125 MG tablet Take 1 tablet by mouth 2 (two) times daily. 14 tablet 0  . amphetamine-dextroamphetamine (ADDERALL) 20 MG tablet Take 1 tablet (20 mg total) by mouth 2 (two) times daily. 60 tablet 0  . ARIPiprazole (ABILIFY) 10 MG tablet Take 1 tablet (10 mg total) by mouth daily. 30 tablet 5  . benzonatate (TESSALON) 100 MG capsule Take 1-2 capsules (100-200 mg total) by mouth 3 (three) times daily as needed for cough. 40 capsule 0  . clonazePAM (KLONOPIN) 1 MG tablet Take 1 tablet (1 mg total) by mouth 4 (four) times daily as needed for anxiety. 120 tablet 2  . fluticasone (FLONASE) 50 MCG/ACT nasal spray Place 2 sprays into both nostrils daily as needed for allergies or rhinitis. 9.9 mL 0   No current  facility-administered medications for this visit.     Medication Side Effects: none  Orders placed this visit:  No orders of the defined types were placed in this encounter.   Psychiatric Specialty Exam:  Review of Systems  Musculoskeletal: Negative for falls.  Neurological: Negative for tremors and weakness.  Psychiatric/Behavioral: Negative for depression and suicidal ideas. The patient has insomnia.     There were no vitals taken for this visit.There is no height or weight on file to calculate BMI.  General Appearance: Neat  Eye Contact:  Good  Speech:  Talkative  Volume:  Normal  Mood:  Irritable  Affect:  Appropriate  Thought Process:  Coherent and Descriptions of Associations: Intact  Orientation:  Full (Time, Place, and Person)  Thought Content: Logical  Suicidal Thoughts:  No  Homicidal Thoughts:  No  Memory:  WNL  Judgement:  Fair  Insight:  Fair  Psychomotor Activity:  Negative  Concentration:  Concentration: Fair  Recall:  Good  Fund of Knowledge: Fair  Language: Good  Assets:  Communication Skills Desire for Improvement Financial Resources/Insurance Housing Intimacy Leisure Time Physical Health Resilience Social Support Talents/Skills Transportation Vocational/Educational  ADL's:  Intact  Cognition: WNL  Prognosis:  Good   Screenings:  GAD-7     Office Visit from 02/15/2019 in [facility] Office Visit from 01/31/2019 in [facility] Office Visit from 01/25/2019 in [facility]  Total GAD-7 Score  3  11  18     PHQ2-9     Office Visit from 01/25/2019 in [facility]  PHQ-2 Total Score  0      Receiving Psychotherapy: No   Treatment Plan/Recommendations:   Rule out BPD, IED, ADHD,OCD  Plan: Abilify 10mg  daily to target mood instability.  Klonopin 1mg  4 times daily - continue for now -  explained this will be a temporay increase until anxiety more controlled - no substance issues. Increase Adderall 20mg  to 40mg  daily   ADHD outside testing - tested 61 - adult ADHD in office today. Consider referral for psychological testing.  Will fill out forms for emotional support animal.   RTC 4 weeks  Discussed potential metabolic side effects associated with atypical antipsychotics, as well as potential risk for movement side effects. Advised pt to contact office if movement side effects occur.   Patient advised to contact office with any questions, adverse effects, or acute worsening in signs and symptoms.  Aloha Gell, NP

## 2019-09-11 ENCOUNTER — Other Ambulatory Visit: Payer: Self-pay

## 2019-09-11 ENCOUNTER — Ambulatory Visit (INDEPENDENT_AMBULATORY_CARE_PROVIDER_SITE_OTHER): Payer: 59 | Admitting: Adult Health

## 2019-09-11 ENCOUNTER — Encounter: Payer: Self-pay | Admitting: Adult Health

## 2019-09-11 DIAGNOSIS — F411 Generalized anxiety disorder: Secondary | ICD-10-CM

## 2019-09-11 DIAGNOSIS — F909 Attention-deficit hyperactivity disorder, unspecified type: Secondary | ICD-10-CM

## 2019-09-11 DIAGNOSIS — F319 Bipolar disorder, unspecified: Secondary | ICD-10-CM

## 2019-09-11 DIAGNOSIS — F39 Unspecified mood [affective] disorder: Secondary | ICD-10-CM

## 2019-09-11 DIAGNOSIS — G4700 Insomnia, unspecified: Secondary | ICD-10-CM

## 2019-09-11 DIAGNOSIS — F331 Major depressive disorder, recurrent, moderate: Secondary | ICD-10-CM

## 2019-09-11 MED ORDER — AMPHETAMINE-DEXTROAMPHETAMINE 30 MG PO TABS: 30.0000 mg | ORAL_TABLET | Freq: Two times a day (BID) | ORAL | 0 refills | Status: DC

## 2019-09-11 MED ORDER — ARIPIPRAZOLE 15 MG PO TABS: 15.0000 mg | ORAL_TABLET | Freq: Every day | ORAL | 5 refills | Status: DC

## 2019-09-11 MED ORDER — AMPHETAMINE-DEXTROAMPHETAMINE 20 MG PO TABS: 20.0000 mg | ORAL_TABLET | Freq: Every day | ORAL | 0 refills | Status: DC

## 2019-09-11 NOTE — Progress Notes (Signed)
[name] [id number] [birth year] 33 y.o.  Subjective:   Patient ID:  Jay Montgomery is a 33 y.o. (DOB March 29, 1986) male.  Chief Complaint: No chief complaint on file.   HPI Jay Montgomery presents to the office today for follow-up of Gad, MDD, insomnia, ADHD, BPD 1.  Describes mood today as "ok". Mood symptoms - reports depression, anxiety, and irritability. Stating "I think it's the holidays". Stating "things haven't been the same since my mother passed away". Mother passed away at Christmas. Also stating "I've gotten where I hate Christmas". Increased worry and rumination. Stating "I worry about everything all the time". Also stating "I think that's why I get irritated". Trying to get children christmas presents and pay the bills. Stating "I don't like being around a lot of people". Improved interest and motivation. Taking medications as prescribed. Energy levels stable. Active, exercises 3 days a week. 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  Enjoys some usual interests. Single. Lives with family - fiance and children. Family local - visiting family members. Appetite stable. Weight stable - 201. Sleeps well most nights. Averages 8 to 9 hours.  Focus and concentration improved with Adderall - "still not where I want to be". Completing tasks. Manages aspects of household. Work is "better" - still has a hard time staying focused on tasks". Feels like he is more patient with the children. Not "yelling and talking more".  Denies SI or HI. Denies AH or VH.  Medication trials: Xanax, Celexa, Valium   GAD-7     Office Visit from 02/15/2019 in [place] Office Visit from 01/31/2019 in [place] Office Visit from 01/25/2019 in [place]  Total GAD-7 Score  3  11  18     PHQ2-9     Office Visit from 01/25/2019 in [place]  PHQ-2 Total Score  0       Review of Systems:  Review of Systems  Musculoskeletal: Negative for gait problem.  Neurological: Negative for tremors.  Psychiatric/Behavioral:       Please refer to HPI    Medications: I have reviewed the patient's current medications.  Current Outpatient Medications  Medication Sig Dispense Refill  . amoxicillin-clavulanate (AUGMENTIN) 875-125 MG tablet Take 1 tablet by mouth 2 (two) times daily. 14 tablet 0  . amphetamine-dextroamphetamine (ADDERALL) 20 MG tablet Take 1 tablet (20 mg total) by mouth daily. 30 tablet 0  . amphetamine-dextroamphetamine (ADDERALL) 30 MG tablet Take 1 tablet by mouth 2 (two) times daily. 60 tablet 0  . ARIPiprazole (ABILIFY) 15 MG tablet Take 1 tablet (15 mg total) by mouth daily. 30 tablet 5  . benzonatate (TESSALON) 100 MG capsule Take 1-2 capsules (100-200 mg total) by mouth 3 (three) times daily as needed for cough. 40 capsule 0  . clonazePAM (KLONOPIN) 1 MG tablet Take 1 tablet (1 mg total) by mouth 4 (four) times daily as needed for anxiety. 120 tablet 2  . fluticasone (FLONASE) 50 MCG/ACT nasal spray Place 2 sprays into both nostrils daily as needed for allergies or rhinitis. 9.9 mL 0   No current facility-administered medications for this visit.    Medication Side Effects: None  Allergies:  Allergies  Allergen Reactions  . Hydroxyzine Other (See Comments)    Dizziness, mental fogginess, nausea  . Vicodin [Hydrocodone-Acetaminophen] Hives and Itching    Past Medical History:  Diagnosis Date  . Anxiety   . Chicken pox   .  Headache    due to CSF leak  . Heart murmur    "as a child"  . Spinal stenosis     Family History  Problem Relation Age of Onset  . Arthritis Mother   . Hypertension Mother   . COPD Mother   . Emphysema Mother   . Bipolar disorder Mother   . Arthritis Father   . Diabetes Father   . Bipolar disorder Sister   . Schizophrenia Sister     Social History    Socioeconomic History  . Marital status: Legally Separated    Spouse name: Not on file  . Number of children: Not on file  . Years of education: Not on file  . Highest education level: Not on file  Occupational History  . Occupation: [employer]  Tobacco Use  . Smoking status: Current Every Day Smoker    Packs/day: 1.00    Types: Cigarettes  . Smokeless tobacco: Never Used  Substance and Sexual Activity  . Alcohol use: Not Currently    Comment: occassional  . Drug use: No  . Sexual activity: Yes    Partners: Female    Comment: wife  Other Topics Concern  . Not on file  Social History Narrative  . Not on file   Social Determinants of Health   Financial Resource Strain:   . Difficulty of Paying Living Expenses: Not on file  Food Insecurity:   . Worried About Charity fundraiser in the Last Year: Not on file  . Ran Out of Food in the Last Year: Not on file  Transportation Needs:   . Lack of Transportation (Medical): Not on file  . Lack of Transportation (Non-Medical): Not on file  Physical Activity:   . Days of Exercise per Week: Not on file  . Minutes of Exercise per Session: Not on file  Stress:   . Feeling of Stress : Not on file  Social Connections:   . Frequency of Communication with Friends and Family: Not on file  . Frequency of Social Gatherings with Friends and Family: Not on file  . Attends Religious Services: Not on file  . Active Member of Clubs or Organizations: Not on file  . Attends Archivist Meetings: Not on file  . Marital Status: Not on file  Intimate Partner Violence:   . Fear of Current or Ex-Partner: Not on file  . Emotionally Abused: Not on file  . Physically Abused: Not on file  . Sexually Abused: Not on file    Past Medical History, Surgical history, Social history, and Family history were reviewed and updated as appropriate.   Please see review of systems for further details on the patient's review from today.    Objective:   Physical Exam:  There were no vitals taken for this visit.  Physical Exam Constitutional:      General: He is not in acute distress.    Appearance: He is well-developed.  Musculoskeletal:        General: No deformity.  Neurological:     Mental Status: He is alert and oriented to person, place, and time.     Coordination: Coordination normal.  Psychiatric:        Attention and Perception: Attention and perception normal. He does not perceive auditory or visual hallucinations.        Mood and Affect: Mood is anxious and depressed. Affect is not labile, blunt, angry or inappropriate.        Speech: Speech normal.  Behavior: Behavior is agitated.        Thought Content: Thought content normal. Thought content is not paranoid or delusional. Thought content does not include homicidal or suicidal ideation. Thought content does not include homicidal or suicidal plan.        Cognition and Memory: Cognition and memory normal.        Judgment: Judgment normal.     Comments: Insight intact     Lab Review:     Component Value Date/Time   NA 140 02/15/2019 0949   K 3.8 02/15/2019 0949   CL 104 02/15/2019 0949   CO2 28 02/15/2019 0949   GLUCOSE 92 02/15/2019 0949   BUN 8 02/15/2019 0949   CREATININE 0.99 02/15/2019 0949   CALCIUM 9.3 02/15/2019 0949   GFRNONAA >60 01/23/2019 1755   GFRAA >60 01/23/2019 1755       Component Value Date/Time   WBC 7.9 02/15/2019 0949   RBC 5.02 02/15/2019 0949   HGB 15.4 02/15/2019 0949   HCT 44.8 02/15/2019 0949   PLT 204.0 02/15/2019 0949   MCV 89.3 02/15/2019 0949   MCH 29.7 01/23/2019 1755   MCHC 34.5 02/15/2019 0949   RDW 13.5 02/15/2019 0949   LYMPHSABS 2.1 02/15/2019 0949   MONOABS 0.5 02/15/2019 0949   EOSABS 0.2 02/15/2019 0949   BASOSABS 0.1 02/15/2019 0949    No results found for: POCLITH, LITHIUM   No results found for: PHENYTOIN, PHENOBARB, VALPROATE, CBMZ   .res Assessment: Plan:    Plan: Increase  Abilify 10mg  to 15mg  daily to target mood instability.  Klonopin 1mg  4 times daily. Increase Adderall 20mg  BID to 30mg  BID  Add Adderall 20mg  in the afternoon for work - evening shift  ADHD outside testing - tested 45 - adult ADHD in office today. Consider referral for psychological testing.  Will fill out forms for emotional support animal.   RTC 4 weeks  Discussed potential metabolic side effects associated with atypical antipsychotics, as well as potential risk for movement side effects. Advised pt to contact office if movement side effects occur.   Discussed potential benefits, risks, and side effects of stimulants with patient to include increased heart rate, palpitations, insomnia, increased anxiety, increased irritability, or decreased appetite.  Instructed patient to contact office if experiencing any significant tolerability issues.  Discussed potential benefits, risk, and side effects of benzodiazepines to include potential risk of tolerance and dependence, as well as possible drowsiness.  Advised patient not to drive if experiencing drowsiness and to take lowest possible effective dose to minimize risk of dependence and tolerance.  Patient advised to contact office with any questions, adverse effects, or acute worsening in signs and symptoms. Diagnoses and all orders for this visit:  Mood disorder ([place]) -     ARIPiprazole (ABILIFY) 15 MG tablet; Take 1 tablet (15 mg total) by mouth daily.  Attention deficit hyperactivity disorder (ADHD), unspecified ADHD type -     amphetamine-dextroamphetamine (ADDERALL) 30 MG tablet; Take 1 tablet by mouth 2 (two) times daily. -     amphetamine-dextroamphetamine (ADDERALL) 20 MG tablet; Take 1 tablet (20 mg total) by mouth daily.  Bipolar I disorder ([place])  Major depressive disorder, recurrent episode, moderate (HCC) -     ARIPiprazole (ABILIFY) 15 MG tablet; Take 1 tablet (15 mg total) by mouth daily.  Generalized anxiety disorder -      ARIPiprazole (ABILIFY) 15 MG tablet; Take 1 tablet (15 mg total) by mouth daily.  Insomnia, unspecified type  Please see After Visit Summary for patient specific instructions.  No future appointments.  No orders of the defined types were placed in this encounter.   -------------------------------

## 2019-09-26 ENCOUNTER — Telehealth: Payer: Self-pay | Admitting: Adult Health

## 2019-09-26 NOTE — Telephone Encounter (Signed)
Pt called to advise he had to leave work early last night. Felt he was having a panic attack. Come on all the sudden. Stated stressed more than usual, got the best of him. He did notify his supervisor he was leaving. Ask for a note for work. Please contact @ [phone]

## 2019-09-26 NOTE — Telephone Encounter (Signed)
Rtc to patient and he left early from work last night on 09/25/2019 due to his panic attack. Note typed up and patient will pick up this afternoon.

## 2019-10-09 ENCOUNTER — Other Ambulatory Visit: Payer: Self-pay

## 2019-10-09 ENCOUNTER — Ambulatory Visit (INDEPENDENT_AMBULATORY_CARE_PROVIDER_SITE_OTHER): Payer: 59 | Admitting: Adult Health

## 2019-10-09 ENCOUNTER — Encounter: Payer: Self-pay | Admitting: Adult Health

## 2019-10-09 DIAGNOSIS — F909 Attention-deficit hyperactivity disorder, unspecified type: Secondary | ICD-10-CM | POA: Diagnosis not present

## 2019-10-09 DIAGNOSIS — F319 Bipolar disorder, unspecified: Secondary | ICD-10-CM

## 2019-10-09 DIAGNOSIS — F331 Major depressive disorder, recurrent, moderate: Secondary | ICD-10-CM | POA: Diagnosis not present

## 2019-10-09 DIAGNOSIS — G4700 Insomnia, unspecified: Secondary | ICD-10-CM

## 2019-10-09 DIAGNOSIS — F39 Unspecified mood [affective] disorder: Secondary | ICD-10-CM

## 2019-10-09 DIAGNOSIS — F411 Generalized anxiety disorder: Secondary | ICD-10-CM

## 2019-10-09 MED ORDER — CLONAZEPAM 1 MG PO TABS: 1.0000 mg | ORAL_TABLET | Freq: Four times a day (QID) | ORAL | 2 refills | Status: DC | PRN

## 2019-10-09 MED ORDER — AMPHETAMINE-DEXTROAMPHETAMINE 20 MG PO TABS: 20.0000 mg | ORAL_TABLET | Freq: Every day | ORAL | 0 refills | Status: DC

## 2019-10-09 MED ORDER — AMPHETAMINE-DEXTROAMPHETAMINE 30 MG PO TABS: 30.0000 mg | ORAL_TABLET | Freq: Two times a day (BID) | ORAL | 0 refills | Status: DC

## 2019-10-09 NOTE — Progress Notes (Signed)
DRISTIN DEGON [id number] 06/12/1986 34 y.o.  Subjective:   Patient ID:  Jay Montgomery is a 34 y.o. (DOB [birth year]) male.  Chief Complaint:  Chief Complaint  Patient presents with  . Anxiety  . Depression  . ADHD  . Insomnia  . Other    Bipolar disorder    HPI Jay Montgomery presents to the office today for follow-up of GAD, MDD, insomnia, ADHD, BPD 1.  Describes mood today as "ok". Mood symptoms - reports depression, anxiety, and irritability "some days". Having better days over all. Stating "I feel like I'm ok right now". Frustrated with the chaos at "work". Feels like they are bringing new people in and not training them. Increases his anxiety - "it is really hard for me". Has tried "talking" to management with little "success". Feels like taking his Adderall has helped "a lot at work". Stating my biggest issues is my "job". Stating "things at home are going great". Decreased worry and rumination - "that has settled down". Improved interest and motivation. Taking medications as prescribed. Energy levels stable. Active, exercises 3 days a week. 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  Enjoys some usual interests. Single. Lives with family - fiance of 3 years and 2 children - 3 on the weekend. Family local and supportive. Appetite stable. Weight stable. Sleeps well most nights. Averages 8 to 9 hours most nights. Sometimes taking longer to get to sleep.  Focus and concentration stable with Adderall. Completing tasks. Manages aspects of household. Work is "difficult". Denies SI or HI. Denies AH or VH.  Medication trials: Xanax, Celexa, Valium   GAD-7     Office Visit from 02/15/2019 in [place] Office Visit from 01/31/2019 in [place] Office Visit from 01/25/2019 in [place]  Total GAD-7 Score  3  11  18     PHQ2-9     Office Visit from  01/25/2019 in [place]  PHQ-2 Total Score  0       Review of Systems:  Review of Systems  Musculoskeletal: Negative for gait problem.  Neurological: Negative for tremors.  Psychiatric/Behavioral:       Please refer to HPI    Medications: I have reviewed the patient's current medications.  Current Outpatient Medications  Medication Sig Dispense Refill  . amoxicillin-clavulanate (AUGMENTIN) 875-125 MG tablet Take 1 tablet by mouth 2 (two) times daily. 14 tablet 0  . amphetamine-dextroamphetamine (ADDERALL) 20 MG tablet Take 1 tablet (20 mg total) by mouth daily. 30 tablet 0  . amphetamine-dextroamphetamine (ADDERALL) 30 MG tablet Take 1 tablet by mouth 2 (two) times daily. 60 tablet 0  . ARIPiprazole (ABILIFY) 15 MG tablet Take 1 tablet (15 mg total) by mouth daily. 30 tablet 5  . benzonatate (TESSALON) 100 MG capsule Take 1-2 capsules (100-200 mg total) by mouth 3 (three) times daily as needed for cough. 40 capsule 0  . clonazePAM (KLONOPIN) 1 MG tablet Take 1 tablet (1 mg total) by mouth 4 (four) times daily as needed for anxiety. 120 tablet 2  . fluticasone (FLONASE) 50 MCG/ACT nasal spray Place 2 sprays into both nostrils daily as needed for allergies or rhinitis. 9.9 mL 0   No current facility-administered medications for this visit.    Medication Side Effects: None  Allergies:  Allergies  Allergen Reactions  . Hydroxyzine Other (See Comments)    Dizziness, mental fogginess, nausea  . Vicodin [Hydrocodone-Acetaminophen] Hives and Itching  Past Medical History:  Diagnosis Date  . Anxiety   . Chicken pox   . Headache    due to CSF leak  . Heart murmur    "as a child"  . Spinal stenosis     Family History  Problem Relation Age of Onset  . Arthritis Mother   . Hypertension Mother   . COPD Mother   . Emphysema Mother   . Bipolar disorder Mother   . Arthritis Father   . Diabetes Father   . Bipolar disorder Sister   .  Schizophrenia Sister     Social History   Socioeconomic History  . Marital status: Legally Separated    Spouse name: Not on file  . Number of children: Not on file  . Years of education: Not on file  . Highest education level: Not on file  Occupational History  . Occupation: [employer]  Tobacco Use  . Smoking status: Current Every Day Smoker    Packs/day: 1.00    Types: Cigarettes  . Smokeless tobacco: Never Used  Substance and Sexual Activity  . Alcohol use: Not Currently    Comment: occassional  . Drug use: No  . Sexual activity: Yes    Partners: Female    Comment: wife  Other Topics Concern  . Not on file  Social History Narrative  . Not on file   Social Determinants of Health   Financial Resource Strain:   . Difficulty of Paying Living Expenses: Not on file  Food Insecurity:   . Worried About Charity fundraiser in the Last Year: Not on file  . Ran Out of Food in the Last Year: Not on file  Transportation Needs:   . Lack of Transportation (Medical): Not on file  . Lack of Transportation (Non-Medical): Not on file  Physical Activity:   . Days of Exercise per Week: Not on file  . Minutes of Exercise per Session: Not on file  Stress:   . Feeling of Stress : Not on file  Social Connections:   . Frequency of Communication with Friends and Family: Not on file  . Frequency of Social Gatherings with Friends and Family: Not on file  . Attends Religious Services: Not on file  . Active Member of Clubs or Organizations: Not on file  . Attends Archivist Meetings: Not on file  . Marital Status: Not on file  Intimate Partner Violence:   . Fear of Current or Ex-Partner: Not on file  . Emotionally Abused: Not on file  . Physically Abused: Not on file  . Sexually Abused: Not on file    Past Medical History, Surgical history, Social history, and Family history were reviewed and updated as appropriate.   Please see review of systems for further details on  the patient's review from today.   Objective:   Physical Exam:  There were no vitals taken for this visit.  Physical Exam Constitutional:      General: He is not in acute distress.    Appearance: He is well-developed.  Musculoskeletal:        General: No deformity.  Neurological:     Mental Status: He is alert and oriented to person, place, and time.     Coordination: Coordination normal.  Psychiatric:        Attention and Perception: Attention and perception normal. He does not perceive auditory or visual hallucinations.        Mood and Affect: Mood normal. Mood is not anxious  or depressed. Affect is not labile, blunt, angry or inappropriate.        Speech: Speech normal.        Behavior: Behavior normal.        Thought Content: Thought content normal. Thought content is not paranoid or delusional. Thought content does not include homicidal or suicidal ideation. Thought content does not include homicidal or suicidal plan.        Cognition and Memory: Cognition and memory normal.        Judgment: Judgment normal.     Comments: Insight intact     Lab Review:     Component Value Date/Time   NA 140 02/15/2019 0949   K 3.8 02/15/2019 0949   CL 104 02/15/2019 0949   CO2 28 02/15/2019 0949   GLUCOSE 92 02/15/2019 0949   BUN 8 02/15/2019 0949   CREATININE 0.99 02/15/2019 0949   CALCIUM 9.3 02/15/2019 0949   GFRNONAA >60 01/23/2019 1755   GFRAA >60 01/23/2019 1755       Component Value Date/Time   WBC 7.9 02/15/2019 0949   RBC 5.02 02/15/2019 0949   HGB 15.4 02/15/2019 0949   HCT 44.8 02/15/2019 0949   PLT 204.0 02/15/2019 0949   MCV 89.3 02/15/2019 0949   MCH 29.7 01/23/2019 1755   MCHC 34.5 02/15/2019 0949   RDW 13.5 02/15/2019 0949   LYMPHSABS 2.1 02/15/2019 0949   MONOABS 0.5 02/15/2019 0949   EOSABS 0.2 02/15/2019 0949   BASOSABS 0.1 02/15/2019 0949    No results found for: POCLITH, LITHIUM   No results found for: PHENYTOIN, PHENOBARB, VALPROATE, CBMZ    .res Assessment: Plan:    Plan: Abilify 10mg  to 15mg  daily to target mood instability.  Klonopin 1mg  4 times daily. Adderall 30mg  BID  Adderall 20mg  in the afternoon for work - evening shift  ADHD outside testing - tested 48 - adult ADHD in office today. Consider referral for psychological testing.  Will fill out forms for emotional support animal.   RTC 4 weeks  Discussed potential metabolic side effects associated with atypical antipsychotics, as well as potential risk for movement side effects. Advised pt to contact office if movement side effects occur.   Discussed potential benefits, risks, and side effects of stimulants with patient to include increased heart rate, palpitations, insomnia, increased anxiety, increased irritability, or decreased appetite.  Instructed patient to contact office if experiencing any significant tolerability issues.  Discussed potential benefits, risk, and side effects of benzodiazepines to include potential risk of tolerance and dependence, as well as possible drowsiness.  Advised patient not to drive if experiencing drowsiness and to take lowest possible effective dose to minimize risk of dependence and tolerance.  Patient advised to contact office with any questions, adverse effects, or acute worsening in signs and symptoms. Diagnoses and all orders for this visit:  [place] was seen today for anxiety, depression, adhd, insomnia and other.  Diagnoses and all orders for this visit:  Attention deficit hyperactivity disorder (ADHD), unspecified ADHD type -     amphetamine-dextroamphetamine (ADDERALL) 30 MG tablet; Take 1 tablet by mouth 2 (two) times daily. -     amphetamine-dextroamphetamine (ADDERALL) 20 MG tablet; Take 1 tablet (20 mg total) by mouth daily.  Mood disorder (HCC)  Bipolar I disorder ([place])  Major depressive disorder, recurrent episode, moderate (HCC)  Generalized anxiety disorder -     clonazePAM (KLONOPIN) 1 MG tablet;  Take 1 tablet (1 mg total) by mouth 4 (four) times daily as needed  for anxiety.  Insomnia, unspecified type     Please see After Visit Summary for patient specific instructions.  No future appointments.  No orders of the defined types were placed in this encounter.   -------------------------------

## 2019-10-24 ENCOUNTER — Other Ambulatory Visit: Payer: Self-pay

## 2019-10-24 ENCOUNTER — Encounter: Payer: Self-pay | Admitting: Physician Assistant

## 2019-10-24 ENCOUNTER — Ambulatory Visit: Payer: Managed Care, Other (non HMO) | Attending: Internal Medicine

## 2019-10-24 DIAGNOSIS — Z20822 Contact with and (suspected) exposure to covid-19: Secondary | ICD-10-CM

## 2019-10-26 LAB — NOVEL CORONAVIRUS, NAA: SARS-CoV-2, NAA: NOT DETECTED

## 2019-10-30 ENCOUNTER — Encounter: Payer: Self-pay | Admitting: Physician Assistant

## 2019-11-06 ENCOUNTER — Other Ambulatory Visit: Payer: Self-pay

## 2019-11-06 ENCOUNTER — Ambulatory Visit (INDEPENDENT_AMBULATORY_CARE_PROVIDER_SITE_OTHER): Payer: 59 | Admitting: Adult Health

## 2019-11-06 ENCOUNTER — Encounter: Payer: Self-pay | Admitting: Adult Health

## 2019-11-06 DIAGNOSIS — F319 Bipolar disorder, unspecified: Secondary | ICD-10-CM | POA: Diagnosis not present

## 2019-11-06 DIAGNOSIS — G4700 Insomnia, unspecified: Secondary | ICD-10-CM

## 2019-11-06 DIAGNOSIS — F331 Major depressive disorder, recurrent, moderate: Secondary | ICD-10-CM | POA: Diagnosis not present

## 2019-11-06 DIAGNOSIS — F39 Unspecified mood [affective] disorder: Secondary | ICD-10-CM

## 2019-11-06 DIAGNOSIS — F411 Generalized anxiety disorder: Secondary | ICD-10-CM

## 2019-11-06 DIAGNOSIS — F909 Attention-deficit hyperactivity disorder, unspecified type: Secondary | ICD-10-CM | POA: Diagnosis not present

## 2019-11-06 MED ORDER — AMPHETAMINE-DEXTROAMPHETAMINE 30 MG PO TABS: 30.0000 mg | ORAL_TABLET | Freq: Two times a day (BID) | ORAL | 0 refills | Status: DC

## 2019-11-06 MED ORDER — AMPHETAMINE-DEXTROAMPHETAMINE 20 MG PO TABS: 20.0000 mg | ORAL_TABLET | Freq: Every day | ORAL | 0 refills | Status: DC

## 2019-11-06 NOTE — Progress Notes (Signed)
Jay Montgomery [id number] 05-21-1986 34 y.o.  Subjective:   Patient ID:  Jay Montgomery is a 34 y.o. (DOB [birth year]) male.  Chief Complaint: No chief complaint on file.  HPI   Jay Montgomery presents to the office today for follow-up of GAD, MDD, insomnia, ADHD, and BPD 1.  Describes mood today as "ok". Mood symptoms - reports decreased depression, anxiety, and irritability. Stating "everything is going ok right now". Stating "my family life is good". Having more "stress" at work. Has to confront people in the work setting and gets nervous about it. Stating "there's a lot of bickering in my area". Has to tell people he works with that "they the messed up". Stating "they get upset with me because I take their money away". Is interviewing for a new job. Hoping to be moved to a less stressful area. Decreased worry and rumination - "that has settled down". Improved interest and motivation. Taking medications as prescribed. Energy levels stable. Active, has a regular exercise routine. 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  Enjoys some usual interests. Single. Lives with family - fiance of 3 years and 3 children. Family local and supportive. Appetite stable. Weight stable - 201. Sleeps well most nights. Averages 8 to 9 hours. Focus and concentration improved with Adderall. Completing tasks. Manages aspects of household. Difficulties in work setting.. Denies SI or HI. Denies AH or VH.  Medication trials: Xanax, Celexa, Valium   GAD-7     Office Visit from 02/15/2019 in [facility] Office Visit from 01/31/2019 in [facility] Office Visit from 01/25/2019 in [facility]  Total GAD-7 Score  3  11  18     PHQ2-9     Office Visit from 01/25/2019 in [facility]  PHQ-2 Total Score  0       Review of Systems:  Review of Systems   Musculoskeletal: Negative for gait problem.  Neurological: Negative for tremors.  Psychiatric/Behavioral:       Please refer to HPI    Medications: I have reviewed the patient's current medications.  Current Outpatient Medications  Medication Sig Dispense Refill  . amoxicillin-clavulanate (AUGMENTIN) 875-125 MG tablet Take 1 tablet by mouth 2 (two) times daily. 14 tablet 0  . amphetamine-dextroamphetamine (ADDERALL) 20 MG tablet Take 1 tablet (20 mg total) by mouth daily. 30 tablet 0  . amphetamine-dextroamphetamine (ADDERALL) 30 MG tablet Take 1 tablet by mouth 2 (two) times daily. 60 tablet 0  . ARIPiprazole (ABILIFY) 15 MG tablet Take 1 tablet (15 mg total) by mouth daily. 30 tablet 5  . benzonatate (TESSALON) 100 MG capsule Take 1-2 capsules (100-200 mg total) by mouth 3 (three) times daily as needed for cough. 40 capsule 0  . clonazePAM (KLONOPIN) 1 MG tablet Take 1 tablet (1 mg total) by mouth 4 (four) times daily as needed for anxiety. 120 tablet 2  . fluticasone (FLONASE) 50 MCG/ACT nasal spray Place 2 sprays into both nostrils daily as needed for allergies or rhinitis. 9.9 mL 0   No current facility-administered medications for this visit.    Medication Side Effects: None  Allergies:  Allergies  Allergen Reactions  . Hydroxyzine Other (See Comments)    Dizziness, mental fogginess, nausea  . Vicodin [Hydrocodone-Acetaminophen] Hives and Itching    Past Medical History:  Diagnosis Date  . Anxiety   . Chicken pox   . Headache    due to CSF leak  .  Heart murmur    "as a child"  . Spinal stenosis     Family History  Problem Relation Age of Onset  . Arthritis Mother   . Hypertension Mother   . COPD Mother   . Emphysema Mother   . Bipolar disorder Mother   . Arthritis Father   . Diabetes Father   . Bipolar disorder Sister   . Schizophrenia Sister     Social History   Socioeconomic History  . Marital status: Legally Separated    Spouse name: Not on file   . Number of children: Not on file  . Years of education: Not on file  . Highest education level: Not on file  Occupational History  . Occupation: [employer]  Tobacco Use  . Smoking status: Current Every Day Smoker    Packs/day: 1.00    Types: Cigarettes  . Smokeless tobacco: Never Used  Substance and Sexual Activity  . Alcohol use: Not Currently    Comment: occassional  . Drug use: No  . Sexual activity: Yes    Partners: Female    Comment: wife  Other Topics Concern  . Not on file  Social History Narrative  . Not on file   Social Determinants of Health   Financial Resource Strain:   . Difficulty of Paying Living Expenses: Not on file  Food Insecurity:   . Worried About Charity fundraiser in the Last Year: Not on file  . Ran Out of Food in the Last Year: Not on file  Transportation Needs:   . Lack of Transportation (Medical): Not on file  . Lack of Transportation (Non-Medical): Not on file  Physical Activity:   . Days of Exercise per Week: Not on file  . Minutes of Exercise per Session: Not on file  Stress:   . Feeling of Stress : Not on file  Social Connections:   . Frequency of Communication with Friends and Family: Not on file  . Frequency of Social Gatherings with Friends and Family: Not on file  . Attends Religious Services: Not on file  . Active Member of Clubs or Organizations: Not on file  . Attends Archivist Meetings: Not on file  . Marital Status: Not on file  Intimate Partner Violence:   . Fear of Current or Ex-Partner: Not on file  . Emotionally Abused: Not on file  . Physically Abused: Not on file  . Sexually Abused: Not on file    Past Medical History, Surgical history, Social history, and Family history were reviewed and updated as appropriate.   Please see review of systems for further details on the patient's review from today.   Objective:   Physical Exam:  There were no vitals taken for this visit.  Physical  Exam Constitutional:      General: He is not in acute distress.    Appearance: He is well-developed.  Musculoskeletal:        General: No deformity.  Neurological:     Mental Status: He is alert and oriented to person, place, and time.     Coordination: Coordination normal.  Psychiatric:        Attention and Perception: Attention and perception normal. He does not perceive auditory or visual hallucinations.        Mood and Affect: Mood normal. Mood is not anxious or depressed. Affect is not labile, blunt, angry or inappropriate.        Speech: Speech normal.  Behavior: Behavior normal.        Thought Content: Thought content normal. Thought content is not paranoid or delusional. Thought content does not include homicidal or suicidal ideation. Thought content does not include homicidal or suicidal plan.        Cognition and Memory: Cognition and memory normal.        Judgment: Judgment normal.     Comments: Insight intact    Lab Review:     Component Value Date/Time   NA 140 02/15/2019 0949   K 3.8 02/15/2019 0949   CL 104 02/15/2019 0949   CO2 28 02/15/2019 0949   GLUCOSE 92 02/15/2019 0949   BUN 8 02/15/2019 0949   CREATININE 0.99 02/15/2019 0949   CALCIUM 9.3 02/15/2019 0949   GFRNONAA >60 01/23/2019 1755   GFRAA >60 01/23/2019 1755       Component Value Date/Time   WBC 7.9 02/15/2019 0949   RBC 5.02 02/15/2019 0949   HGB 15.4 02/15/2019 0949   HCT 44.8 02/15/2019 0949   PLT 204.0 02/15/2019 0949   MCV 89.3 02/15/2019 0949   MCH 29.7 01/23/2019 1755   MCHC 34.5 02/15/2019 0949   RDW 13.5 02/15/2019 0949   LYMPHSABS 2.1 02/15/2019 0949   MONOABS 0.5 02/15/2019 0949   EOSABS 0.2 02/15/2019 0949   BASOSABS 0.1 02/15/2019 0949    No results found for: POCLITH, LITHIUM   No results found for: PHENYTOIN, PHENOBARB, VALPROATE, CBMZ   .res Assessment: Plan:    Plan:  Abilify 15mg  daily to target mood instability.  Klonopin 1mg  4 times daily. Adderall  30mg  BID  Adderall 20mg  in the afternoon for work - evening shift  ADHD outside testing - tested 12 - adult ADHD in office today. Consider referral for psychological testing.  Will fill out forms for emotional support animal.   RTC 4 weeks  Discussed potential metabolic side effects associated with atypical antipsychotics, as well as potential risk for movement side effects. Advised pt to contact office if movement side effects occur.   Discussed potential benefits, risks, and side effects of stimulants with patient to include increased heart rate, palpitations, insomnia, increased anxiety, increased irritability, or decreased appetite.  Instructed patient to contact office if experiencing any significant tolerability issues.  Discussed potential benefits, risk, and side effects of benzodiazepines to include potential risk of tolerance and dependence, as well as possible drowsiness.  Advised patient not to drive if experiencing drowsiness and to take lowest possible effective dose to minimize risk of dependence and tolerance.  Patient advised to contact office with any questions, adverse effects, or acute worsening in signs and symptoms. Diagnoses and all orders for this visit:  Diagnoses and all orders for this visit:  Attention deficit hyperactivity disorder (ADHD), unspecified ADHD type  Mood disorder (HCC)  Bipolar I disorder ([place])  Major depressive disorder, recurrent episode, moderate (HCC)  Generalized anxiety disorder  Insomnia, unspecified type     Please see After Visit Summary for patient specific instructions.  No future appointments.  No orders of the defined types were placed in this encounter.   -------------------------------

## 2019-12-18 ENCOUNTER — Telehealth: Payer: Self-pay | Admitting: Adult Health

## 2019-12-18 ENCOUNTER — Other Ambulatory Visit: Payer: Self-pay

## 2019-12-18 DIAGNOSIS — F909 Attention-deficit hyperactivity disorder, unspecified type: Secondary | ICD-10-CM

## 2019-12-18 MED ORDER — AMPHETAMINE-DEXTROAMPHETAMINE 30 MG PO TABS: 30.0000 mg | ORAL_TABLET | Freq: Two times a day (BID) | ORAL | 0 refills | Status: DC

## 2019-12-18 MED ORDER — AMPHETAMINE-DEXTROAMPHETAMINE 20 MG PO TABS: 20.0000 mg | ORAL_TABLET | Freq: Every day | ORAL | 0 refills | Status: DC

## 2019-12-18 NOTE — Telephone Encounter (Signed)
Last refill on both 11/13/2019, pended for Barnett Applebaum to submit Has apt scheduled in April

## 2019-12-18 NOTE — Telephone Encounter (Signed)
Pt would like a refill on his Adderall 20mg  and 30mg . Please send to [facility] on [address].

## 2020-01-03 ENCOUNTER — Ambulatory Visit (INDEPENDENT_AMBULATORY_CARE_PROVIDER_SITE_OTHER): Payer: 59 | Admitting: Adult Health

## 2020-01-03 ENCOUNTER — Encounter: Payer: Self-pay | Admitting: Adult Health

## 2020-01-03 ENCOUNTER — Other Ambulatory Visit: Payer: Self-pay

## 2020-01-03 DIAGNOSIS — F39 Unspecified mood [affective] disorder: Secondary | ICD-10-CM

## 2020-01-03 DIAGNOSIS — F411 Generalized anxiety disorder: Secondary | ICD-10-CM

## 2020-01-03 DIAGNOSIS — F909 Attention-deficit hyperactivity disorder, unspecified type: Secondary | ICD-10-CM | POA: Diagnosis not present

## 2020-01-03 DIAGNOSIS — F331 Major depressive disorder, recurrent, moderate: Secondary | ICD-10-CM

## 2020-01-03 MED ORDER — AMPHETAMINE-DEXTROAMPHETAMINE 30 MG PO TABS: 30.0000 mg | ORAL_TABLET | Freq: Two times a day (BID) | ORAL | 0 refills | Status: DC

## 2020-01-03 MED ORDER — AMPHETAMINE-DEXTROAMPHETAMINE 20 MG PO TABS: 20.0000 mg | ORAL_TABLET | Freq: Every day | ORAL | 0 refills | Status: DC

## 2020-01-03 MED ORDER — ARIPIPRAZOLE 15 MG PO TABS: 15.0000 mg | ORAL_TABLET | Freq: Every day | ORAL | 5 refills | Status: DC

## 2020-01-03 MED ORDER — CLONAZEPAM 1 MG PO TABS: 1.0000 mg | ORAL_TABLET | Freq: Four times a day (QID) | ORAL | 2 refills | Status: DC | PRN

## 2020-01-03 NOTE — Progress Notes (Signed)
MCKAY FITZNER [id number] 1986-06-18 34 y.o.  Subjective:   Patient ID:  Jay Montgomery is a 34 y.o. (DOB 1985/10/04) male.  Chief Complaint: No chief complaint on file.   HPI Jay Montgomery presents to the office today for follow-up of GAD, MDD, insomnia, ADHD, and BPD 1.  Describes mood today as "ok". Mood symptoms - reports decreased depression, anxiety, and irritability. Stating "I feel like I'm doing alright". Home life is "great". Continues to have issues in work setting. Has gotten another job and is being transferred out. Stating "sometime I feel like I need a break from that place". Dreads going into work some days. Decreased worry and rumination. Improved interest and motivation. Taking medications as prescribed. Energy levels stable. Active, has a regular exercise routine. 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  Enjoys some usual interests. Single. Lives with family - fiance of 3 years and 3 children. Family local and supportive. 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  Appetite stable. Weight stable - 201. Sleeps well most nights. Averages 7 to 8 hours. Focus and concentration stable. Completing tasks. Manages aspects of household. Work going better - stressful.  Denies SI or HI. Denies AH or VH.  Medication trials: Xanax, Celexa, Valium   GAD-7     Office Visit from 02/15/2019 in [facility] Office Visit from 01/31/2019 in [facility] Office Visit from 01/25/2019 in [facility]  Total GAD-7 Score  3  11  18     PHQ2-9     Office Visit from 01/25/2019 in [facility]  PHQ-2 Total Score  0       Review of Systems:  Review of Systems  Musculoskeletal: Negative for gait problem.  Neurological: Negative for tremors.  Psychiatric/Behavioral:       Please refer to HPI    Medications: I have reviewed the  patient's current medications.  Current Outpatient Medications  Medication Sig Dispense Refill  . amoxicillin-clavulanate (AUGMENTIN) 875-125 MG tablet Take 1 tablet by mouth 2 (two) times daily. 14 tablet 0  . [START ON 02/28/2020] amphetamine-dextroamphetamine (ADDERALL) 20 MG tablet Take 1 tablet (20 mg total) by mouth daily. 30 tablet 0  . [START ON 02/28/2020] amphetamine-dextroamphetamine (ADDERALL) 30 MG tablet Take 1 tablet by mouth 2 (two) times daily. 60 tablet 0  . ARIPiprazole (ABILIFY) 15 MG tablet Take 1 tablet (15 mg total) by mouth daily. 30 tablet 5  . benzonatate (TESSALON) 100 MG capsule Take 1-2 capsules (100-200 mg total) by mouth 3 (three) times daily as needed for cough. 40 capsule 0  . clonazePAM (KLONOPIN) 1 MG tablet Take 1 tablet (1 mg total) by mouth 4 (four) times daily as needed for anxiety. 120 tablet 2  . fluticasone (FLONASE) 50 MCG/ACT nasal spray Place 2 sprays into both nostrils daily as needed for allergies or rhinitis. 9.9 mL 0   No current facility-administered medications for this visit.    Medication Side Effects: None  Allergies:  Allergies  Allergen Reactions  . Hydroxyzine Other (See Comments)    Dizziness, mental fogginess, nausea  . Vicodin [Hydrocodone-Acetaminophen] Hives and Itching    Past Medical History:  Diagnosis Date  . Anxiety   . Chicken pox   . Headache    due to CSF leak  . Heart murmur    "as a child"  . Spinal stenosis     Family History  Problem Relation Age of Onset  . Arthritis  Mother   . Hypertension Mother   . COPD Mother   . Emphysema Mother   . Bipolar disorder Mother   . Arthritis Father   . Diabetes Father   . Bipolar disorder Sister   . Schizophrenia Sister     Social History   Socioeconomic History  . Marital status: Legally Separated    Spouse name: Not on file  . Number of children: Not on file  . Years of education: Not on file  . Highest education level: Not on file  Occupational History   . Occupation: [employer]  Tobacco Use  . Smoking status: Current Every Day Smoker    Packs/day: 1.00    Types: Cigarettes  . Smokeless tobacco: Never Used  Substance and Sexual Activity  . Alcohol use: Not Currently    Comment: occassional  . Drug use: No  . Sexual activity: Yes    Partners: Female    Comment: wife  Other Topics Concern  . Not on file  Social History Narrative  . Not on file   Social Determinants of Health   Financial Resource Strain:   . Difficulty of Paying Living Expenses:   Food Insecurity:   . Worried About Charity fundraiser in the Last Year:   . Arboriculturist in the Last Year:   Transportation Needs:   . Film/video editor (Medical):   Marland Kitchen Lack of Transportation (Non-Medical):   Physical Activity:   . Days of Exercise per Week:   . Minutes of Exercise per Session:   Stress:   . Feeling of Stress :   Social Connections:   . Frequency of Communication with Friends and Family:   . Frequency of Social Gatherings with Friends and Family:   . Attends Religious Services:   . Active Member of Clubs or Organizations:   . Attends Archivist Meetings:   Marland Kitchen Marital Status:   Intimate Partner Violence:   . Fear of Current or Ex-Partner:   . Emotionally Abused:   Marland Kitchen Physically Abused:   . Sexually Abused:     Past Medical History, Surgical history, Social history, and Family history were reviewed and updated as appropriate.   Please see review of systems for further details on the patient's review from today.   Objective:   Physical Exam:  There were no vitals taken for this visit.  Physical Exam Constitutional:      General: He is not in acute distress. Musculoskeletal:        General: No deformity.  Neurological:     Mental Status: He is alert and oriented to person, place, and time.     Coordination: Coordination normal.  Psychiatric:        Attention and Perception: Attention and perception normal. He does not perceive  auditory or visual hallucinations.        Mood and Affect: Mood normal. Mood is not anxious or depressed. Affect is not labile, blunt, angry or inappropriate.        Speech: Speech normal.        Behavior: Behavior normal.        Thought Content: Thought content normal. Thought content is not paranoid or delusional. Thought content does not include homicidal or suicidal ideation. Thought content does not include homicidal or suicidal plan.        Cognition and Memory: Cognition and memory normal.        Judgment: Judgment normal.     Comments: Insight intact  Lab Review:     Component Value Date/Time   NA 140 02/15/2019 0949   K 3.8 02/15/2019 0949   CL 104 02/15/2019 0949   CO2 28 02/15/2019 0949   GLUCOSE 92 02/15/2019 0949   BUN 8 02/15/2019 0949   CREATININE 0.99 02/15/2019 0949   CALCIUM 9.3 02/15/2019 0949   GFRNONAA >60 01/23/2019 1755   GFRAA >60 01/23/2019 1755       Component Value Date/Time   WBC 7.9 02/15/2019 0949   RBC 5.02 02/15/2019 0949   HGB 15.4 02/15/2019 0949   HCT 44.8 02/15/2019 0949   PLT 204.0 02/15/2019 0949   MCV 89.3 02/15/2019 0949   MCH 29.7 01/23/2019 1755   MCHC 34.5 02/15/2019 0949   RDW 13.5 02/15/2019 0949   LYMPHSABS 2.1 02/15/2019 0949   MONOABS 0.5 02/15/2019 0949   EOSABS 0.2 02/15/2019 0949   BASOSABS 0.1 02/15/2019 0949    No results found for: POCLITH, LITHIUM   No results found for: PHENYTOIN, PHENOBARB, VALPROATE, CBMZ   .res Assessment: Plan:    Plan:  Abilify 15mg  daily to target mood instability.  Klonopin 1mg  4 times daily. Adderall 30mg  BID  Adderall 20mg  in the afternoon for work - evening shift  ADHD outside testing - tested 65 - adult ADHD in office today. Consider referral for psychological testing.  RTC 3 months  Discussed potential metabolic side effects associated with atypical antipsychotics, as well as potential risk for movement side effects. Advised pt to contact office if movement side effects  occur.   Discussed potential benefits, risks, and side effects of stimulants with patient to include increased heart rate, palpitations, insomnia, increased anxiety, increased irritability, or decreased appetite.  Instructed patient to contact office if experiencing any significant tolerability issues.  Discussed potential benefits, risk, and side effects of benzodiazepines to include potential risk of tolerance and dependence, as well as possible drowsiness.  Advised patient not to drive if experiencing drowsiness and to take lowest possible effective dose to minimize risk of dependence and tolerance.  Patient advised to contact office with any questions, adverse effects, or acute worsening in signs and symptoms. Diagnoses and all orders for this visit:   Diagnoses and all orders for this visit:  Attention deficit hyperactivity disorder (ADHD), unspecified ADHD type -     Discontinue: amphetamine-dextroamphetamine (ADDERALL) 30 MG tablet; Take 1 tablet by mouth 2 (two) times daily. -     Discontinue: amphetamine-dextroamphetamine (ADDERALL) 20 MG tablet; Take 1 tablet (20 mg total) by mouth daily. -     Discontinue: amphetamine-dextroamphetamine (ADDERALL) 30 MG tablet; Take 1 tablet by mouth 2 (two) times daily. -     Discontinue: amphetamine-dextroamphetamine (ADDERALL) 20 MG tablet; Take 1 tablet (20 mg total) by mouth daily. -     amphetamine-dextroamphetamine (ADDERALL) 30 MG tablet; Take 1 tablet by mouth 2 (two) times daily. -     amphetamine-dextroamphetamine (ADDERALL) 20 MG tablet; Take 1 tablet (20 mg total) by mouth daily.  Mood disorder (HCC) -     ARIPiprazole (ABILIFY) 15 MG tablet; Take 1 tablet (15 mg total) by mouth daily.  Major depressive disorder, recurrent episode, moderate (HCC) -     ARIPiprazole (ABILIFY) 15 MG tablet; Take 1 tablet (15 mg total) by mouth daily.  Generalized anxiety disorder -     ARIPiprazole (ABILIFY) 15 MG tablet; Take 1 tablet (15 mg total) by  mouth daily. -     clonazePAM (KLONOPIN) 1 MG tablet; Take 1 tablet (1 mg total)  by mouth 4 (four) times daily as needed for anxiety.     Please see After Visit Summary for patient specific instructions.  No future appointments.  No orders of the defined types were placed in this encounter.   -------------------------------

## 2020-04-03 ENCOUNTER — Other Ambulatory Visit: Payer: Self-pay

## 2020-04-03 ENCOUNTER — Encounter: Payer: Self-pay | Admitting: Adult Health

## 2020-04-03 ENCOUNTER — Ambulatory Visit (INDEPENDENT_AMBULATORY_CARE_PROVIDER_SITE_OTHER): Payer: 59 | Admitting: Adult Health

## 2020-04-03 DIAGNOSIS — F909 Attention-deficit hyperactivity disorder, unspecified type: Secondary | ICD-10-CM

## 2020-04-03 DIAGNOSIS — F411 Generalized anxiety disorder: Secondary | ICD-10-CM | POA: Diagnosis not present

## 2020-04-03 DIAGNOSIS — F39 Unspecified mood [affective] disorder: Secondary | ICD-10-CM | POA: Diagnosis not present

## 2020-04-03 DIAGNOSIS — F331 Major depressive disorder, recurrent, moderate: Secondary | ICD-10-CM

## 2020-04-03 MED ORDER — ARIPIPRAZOLE 15 MG PO TABS: 15.0000 mg | ORAL_TABLET | Freq: Every day | ORAL | 5 refills | Status: DC

## 2020-04-03 MED ORDER — AMPHETAMINE-DEXTROAMPHETAMINE 20 MG PO TABS: 20.0000 mg | ORAL_TABLET | Freq: Every day | ORAL | 0 refills | Status: DC

## 2020-04-03 MED ORDER — CLONAZEPAM 1 MG PO TABS: 1.0000 mg | ORAL_TABLET | Freq: Four times a day (QID) | ORAL | 2 refills | Status: DC | PRN

## 2020-04-03 MED ORDER — AMPHETAMINE-DEXTROAMPHETAMINE 30 MG PO TABS: 30.0000 mg | ORAL_TABLET | Freq: Two times a day (BID) | ORAL | 0 refills | Status: DC

## 2020-04-03 NOTE — Progress Notes (Signed)
CHISOM AUST [id number] Aug 26, 1986 34 y.o.  Subjective:   Patient ID:  Jay Montgomery is a 34 y.o. (DOB 1986/01/19) male.  Chief Complaint: No chief complaint on file.   HPI Jay Montgomery presents to the office today for follow-up of GAD, MDD, insomnia, ADHD, and BPD 1.  Describes mood today as "ok". Mood symptoms - denies depression, anxiety, and irritability. Stating "I'm doing alright". Had 2 days this week where Jay Montgomery felt like medications weren't working for him. Stating "I don't know why that happened". Missed 2 days of work this week. Stating "I feel fine now". Reports taking medications consistently. Has changed jobs at work and feels like things are "much better" there.Spending time with family - racing cars. Stable interest and motivation. Taking medications as prescribed. Energy levels stable. Active, has a regular exercise routine.  Enjoys some usual interests. Single. Lives with family - fiance of 3 years and 3 children. Gamily ily local and supportive. 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  Appetite stable. Weight stable - 207. Sleeps well most nights. Averages 7 to 8 hours. Focus and concentration stable. Completing tasks. Manages aspects of household. Work going well. 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  Denies SI or HI. Denies AH or VH.  Medication trials: Xanax, Celexa, Valium   GAD-7     Office Visit from 02/15/2019 in [facility] Office Visit from 01/31/2019 in [facility] Office Visit from 01/25/2019 in [facility]  Total GAD-7 Score 3 11 18     PHQ2-9     Office Visit from 01/25/2019 in [facility]  PHQ-2 Total Score 0       Review of Systems:  Review of Systems  Musculoskeletal: Negative for gait problem.  Neurological: Negative for tremors.  Psychiatric/Behavioral:       Please refer  to HPI    Medications: I have reviewed the patient's current medications.  Current Outpatient Medications  Medication Sig Dispense Refill  . amoxicillin-clavulanate (AUGMENTIN) 875-125 MG tablet Take 1 tablet by mouth 2 (two) times daily. 14 tablet 0  . [START ON 05/29/2020] amphetamine-dextroamphetamine (ADDERALL) 20 MG tablet Take 1 tablet (20 mg total) by mouth daily. 30 tablet 0  . [START ON 05/29/2020] amphetamine-dextroamphetamine (ADDERALL) 30 MG tablet Take 1 tablet by mouth 2 (two) times daily. 60 tablet 0  . ARIPiprazole (ABILIFY) 15 MG tablet Take 1 tablet (15 mg total) by mouth daily. 30 tablet 5  . benzonatate (TESSALON) 100 MG capsule Take 1-2 capsules (100-200 mg total) by mouth 3 (three) times daily as needed for cough. 40 capsule 0  . clonazePAM (KLONOPIN) 1 MG tablet Take 1 tablet (1 mg total) by mouth 4 (four) times daily as needed for anxiety. 120 tablet 2  . fluticasone (FLONASE) 50 MCG/ACT nasal spray Place 2 sprays into both nostrils daily as needed for allergies or rhinitis. 9.9 mL 0   No current facility-administered medications for this visit.    Medication Side Effects: None  Allergies:  Allergies  Allergen Reactions  . Hydroxyzine Other (See Comments)    Dizziness, mental fogginess, nausea  . Vicodin [Hydrocodone-Acetaminophen] Hives and Itching    Past Medical History:  Diagnosis Date  . Anxiety   . Chicken pox   . Headache    due to CSF leak  . Heart murmur    "as a child"  . Spinal stenosis     Family History  Problem Relation Age  of Onset  . Arthritis Mother   . Hypertension Mother   . COPD Mother   . Emphysema Mother   . Bipolar disorder Mother   . Arthritis Father   . Diabetes Father   . Bipolar disorder Sister   . Schizophrenia Sister     Social History   Socioeconomic History  . Marital status: Legally Separated    Spouse name: Not on file  . Number of children: Not on file  . Years of education: Not on file  . Highest  education level: Not on file  Occupational History  . Occupation: [employer]  Tobacco Use  . Smoking status: Current Every Day Smoker    Packs/day: 1.00    Types: Cigarettes  . Smokeless tobacco: Never Used  Vaping Use  . Vaping Use: Former  Substance and Sexual Activity  . Alcohol use: Not Currently    Comment: occassional  . Drug use: No  . Sexual activity: Yes    Partners: Female    Comment: wife  Other Topics Concern  . Not on file  Social History Narrative  . Not on file   Social Determinants of Health   Financial Resource Strain:   . Difficulty of Paying Living Expenses:   Food Insecurity:   . Worried About Charity fundraiser in the Last Year:   . Arboriculturist in the Last Year:   Transportation Needs:   . Film/video editor (Medical):   Marland Kitchen Lack of Transportation (Non-Medical):   Physical Activity:   . Days of Exercise per Week:   . Minutes of Exercise per Session:   Stress:   . Feeling of Stress :   Social Connections:   . Frequency of Communication with Friends and Family:   . Frequency of Social Gatherings with Friends and Family:   . Attends Religious Services:   . Active Member of Clubs or Organizations:   . Attends Archivist Meetings:   Marland Kitchen Marital Status:   Intimate Partner Violence:   . Fear of Current or Ex-Partner:   . Emotionally Abused:   Marland Kitchen Physically Abused:   . Sexually Abused:     Past Medical History, Surgical history, Social history, and Family history were reviewed and updated as appropriate.   Please see review of systems for further details on the patient's review from today.   Objective:   Physical Exam:  There were no vitals taken for this visit.  Physical Exam Constitutional:      General: Jay Montgomery is not in acute distress. Musculoskeletal:        General: No deformity.  Neurological:     Mental Status: Jay Montgomery is alert and oriented to person, place, and time.     Coordination: Coordination normal.  Psychiatric:         Attention and Perception: Attention and perception normal. Jay Montgomery does not perceive auditory or visual hallucinations.        Mood and Affect: Mood normal. Mood is not anxious or depressed. Affect is not labile, blunt, angry or inappropriate.        Speech: Speech normal.        Behavior: Behavior normal.        Thought Content: Thought content normal. Thought content is not paranoid or delusional. Thought content does not include homicidal or suicidal ideation. Thought content does not include homicidal or suicidal plan.        Cognition and Memory: Cognition and memory normal.  Judgment: Judgment normal.     Comments: Insight intact     Lab Review:     Component Value Date/Time   NA 140 02/15/2019 0949   K 3.8 02/15/2019 0949   CL 104 02/15/2019 0949   CO2 28 02/15/2019 0949   GLUCOSE 92 02/15/2019 0949   BUN 8 02/15/2019 0949   CREATININE 0.99 02/15/2019 0949   CALCIUM 9.3 02/15/2019 0949   GFRNONAA >60 01/23/2019 1755   GFRAA >60 01/23/2019 1755       Component Value Date/Time   WBC 7.9 02/15/2019 0949   RBC 5.02 02/15/2019 0949   HGB 15.4 02/15/2019 0949   HCT 44.8 02/15/2019 0949   PLT 204.0 02/15/2019 0949   MCV 89.3 02/15/2019 0949   MCH 29.7 01/23/2019 1755   MCHC 34.5 02/15/2019 0949   RDW 13.5 02/15/2019 0949   LYMPHSABS 2.1 02/15/2019 0949   MONOABS 0.5 02/15/2019 0949   EOSABS 0.2 02/15/2019 0949   BASOSABS 0.1 02/15/2019 0949    No results found for: POCLITH, LITHIUM   No results found for: PHENYTOIN, PHENOBARB, VALPROATE, CBMZ   .res Assessment: Plan:    Plan:  Abilify 15mg  daily to target mood instability.  Klonopin 1mg  4 times daily. Adderall 30mg  BID  Adderall 20mg  in the afternoon for work - evening shift  ADHD outside testing - tested 74 - adult ADHD in office today. Consider referral for psychological testing.  RTC 3 months  Discussed potential metabolic side effects associated with atypical antipsychotics, as well as potential  risk for movement side effects. Advised pt to contact office if movement side effects occur.   Discussed potential benefits, risks, and side effects of stimulants with patient to include increased heart rate, palpitations, insomnia, increased anxiety, increased irritability, or decreased appetite.  Instructed patient to contact office if experiencing any significant tolerability issues.  Discussed potential benefits, risk, and side effects of benzodiazepines to include potential risk of tolerance and dependence, as well as possible drowsiness.  Advised patient not to drive if experiencing drowsiness and to take lowest possible effective dose to minimize risk of dependence and tolerance.  Patient advised to contact office with any questions, adverse effects, or acute worsening in signs and symptoms.   Diagnoses and all orders for this visit:  Generalized anxiety disorder -     clonazePAM (KLONOPIN) 1 MG tablet; Take 1 tablet (1 mg total) by mouth 4 (four) times daily as needed for anxiety. -     ARIPiprazole (ABILIFY) 15 MG tablet; Take 1 tablet (15 mg total) by mouth daily.  Mood disorder (HCC) -     ARIPiprazole (ABILIFY) 15 MG tablet; Take 1 tablet (15 mg total) by mouth daily.  Major depressive disorder, recurrent episode, moderate (HCC) -     ARIPiprazole (ABILIFY) 15 MG tablet; Take 1 tablet (15 mg total) by mouth daily.  Attention deficit hyperactivity disorder (ADHD), unspecified ADHD type -     Discontinue: amphetamine-dextroamphetamine (ADDERALL) 30 MG tablet; Take 1 tablet by mouth 2 (two) times daily. -     Discontinue: amphetamine-dextroamphetamine (ADDERALL) 20 MG tablet; Take 1 tablet (20 mg total) by mouth daily. -     Discontinue: amphetamine-dextroamphetamine (ADDERALL) 30 MG tablet; Take 1 tablet by mouth 2 (two) times daily. -     Discontinue: amphetamine-dextroamphetamine (ADDERALL) 20 MG tablet; Take 1 tablet (20 mg total) by mouth daily. -      amphetamine-dextroamphetamine (ADDERALL) 30 MG tablet; Take 1 tablet by mouth 2 (two) times daily. -  amphetamine-dextroamphetamine (ADDERALL) 20 MG tablet; Take 1 tablet (20 mg total) by mouth daily.     Please see After Visit Summary for patient specific instructions.  Future Appointments  Date Time Provider [place]  07/03/2020  9:40 AM Phuong Hillary, Berdie Ogren, NP CP-CP None    No orders of the defined types were placed in this encounter.   -------------------------------

## 2020-04-27 ENCOUNTER — Encounter: Payer: Self-pay | Admitting: Physician Assistant

## 2020-04-28 NOTE — Telephone Encounter (Signed)
Patient would need appt

## 2020-04-29 ENCOUNTER — Encounter: Payer: Self-pay | Admitting: Physician Assistant

## 2020-04-29 ENCOUNTER — Telehealth (INDEPENDENT_AMBULATORY_CARE_PROVIDER_SITE_OTHER): Payer: Managed Care, Other (non HMO) | Admitting: Physician Assistant

## 2020-04-29 ENCOUNTER — Other Ambulatory Visit: Payer: Self-pay

## 2020-04-29 DIAGNOSIS — Z20822 Contact with and (suspected) exposure to covid-19: Secondary | ICD-10-CM | POA: Diagnosis not present

## 2020-04-29 MED ORDER — BENZONATATE 100 MG PO CAPS: 100.0000 mg | ORAL_CAPSULE | Freq: Three times a day (TID) | ORAL | 0 refills | Status: DC | PRN

## 2020-04-29 NOTE — Progress Notes (Signed)
I have discussed the procedure for the virtual visit with the patient who has given consent to proceed with assessment and treatment.   Bethzaida Boord S Harryette Shuart, CMA     

## 2020-04-29 NOTE — Progress Notes (Signed)
   Virtual Visit via Video   I connected with patient on 04/29/20 at  9:00 AM EDT by a video enabled telemedicine application and verified that I am speaking with the correct person using two identifiers.  Location patient: Home Location provider: [facility], Office Persons participating in the virtual visit: Patient, Provider, [place] (Patina Moore)  I discussed the limitations of evaluation and management by telemedicine and the availability of in person appointments. The patient expressed understanding and agreed to proceed.  Subjective:   HPI:   Patient presents via [facility] today c/o sudden onset of diarrhea, headache, dry cough, body aches and chills. Denies true fever but has felt feverish. Notes nasal congestion, sinus pressure. Denies ear pain or tooth pain. Denies SOB. Notes significant fatigue.  Notes in-laws were both tested positive for COVID a few days ago. He and wife had been around them. Wife is doing ok so far.   ROS:   See pertinent positives and negatives per HPI.  Patient Active Problem List   Diagnosis Date Noted  . Bradycardia 05/18/2019  . Syncope and collapse 05/18/2019  . GAD (generalized anxiety disorder) 01/25/2019  . Postoperative CSF leak 12/01/2017  . Spinal stenosis of lumbar region 02/15/2017    Social History   Tobacco Use  . Smoking status: Current Every Day Smoker    Packs/day: 1.00    Types: Cigarettes  . Smokeless tobacco: Never Used  Substance Use Topics  . Alcohol use: Not Currently    Comment: occassional    Current Outpatient Medications:  .  [START ON 05/29/2020] amphetamine-dextroamphetamine (ADDERALL) 20 MG tablet, Take 1 tablet (20 mg total) by mouth daily., Disp: 30 tablet, Rfl: 0 .  [START ON 05/29/2020] amphetamine-dextroamphetamine (ADDERALL) 30 MG tablet, Take 1 tablet by mouth 2 (two) times daily., Disp: 60 tablet, Rfl: 0 .  ARIPiprazole (ABILIFY) 15 MG tablet, Take 1 tablet (15 mg total) by mouth daily., Disp: 30  tablet, Rfl: 5 .  clonazePAM (KLONOPIN) 1 MG tablet, Take 1 tablet (1 mg total) by mouth 4 (four) times daily as needed for anxiety., Disp: 120 tablet, Rfl: 2 .  benzonatate (TESSALON) 100 MG capsule, Take 1 capsule (100 mg total) by mouth 3 (three) times daily as needed., Disp: 30 capsule, Rfl: 0  Allergies  Allergen Reactions  . Hydroxyzine Other (See Comments)    Dizziness, mental fogginess, nausea  . Vicodin [Hydrocodone-Acetaminophen] Hives and Itching    Objective:   There were no vitals taken for this visit.  Patient is well-developed, well-nourished in no acute distress.  Resting comfortably at home.  Head is normocephalic, atraumatic.  No labored breathing.  Speech is clear and coherent with logical content.  Patient is alert and oriented at baseline.   Assessment and Plan:   1. Suspected COVID-19 virus infection + exposure. GI and URI symptoms along with flu-like symptoms. Needs COVID testing. He has been sent for this. He is to quarantine until results are in. Supportive measures, OTC medications reviewed and Vitamin schedule -- Vitamin C 1000 mg, Vitamin D3 1000 units once daily and OTC zinc supplement. Strict ER precautions reviewed with patient.     [name], PA-C 04/29/2020

## 2020-04-29 NOTE — Patient Instructions (Signed)
Instructions sent to MyChart

## 2020-05-14 ENCOUNTER — Telehealth: Payer: Self-pay | Admitting: Adult Health

## 2020-05-14 ENCOUNTER — Ambulatory Visit (INDEPENDENT_AMBULATORY_CARE_PROVIDER_SITE_OTHER): Payer: 59 | Admitting: Adult Health

## 2020-05-14 ENCOUNTER — Encounter: Payer: Self-pay | Admitting: Adult Health

## 2020-05-14 ENCOUNTER — Other Ambulatory Visit: Payer: Self-pay

## 2020-05-14 DIAGNOSIS — F909 Attention-deficit hyperactivity disorder, unspecified type: Secondary | ICD-10-CM

## 2020-05-14 DIAGNOSIS — F411 Generalized anxiety disorder: Secondary | ICD-10-CM | POA: Diagnosis not present

## 2020-05-14 DIAGNOSIS — F331 Major depressive disorder, recurrent, moderate: Secondary | ICD-10-CM

## 2020-05-14 DIAGNOSIS — F39 Unspecified mood [affective] disorder: Secondary | ICD-10-CM

## 2020-05-14 MED ORDER — BUSPIRONE HCL 10 MG PO TABS: 10.0000 mg | ORAL_TABLET | Freq: Three times a day (TID) | ORAL | 2 refills | Status: DC

## 2020-05-14 MED ORDER — ARIPIPRAZOLE 20 MG PO TABS: 20.0000 mg | ORAL_TABLET | Freq: Every day | ORAL | 2 refills | Status: DC

## 2020-05-14 NOTE — Progress Notes (Signed)
YOSHIO SELIGA [id number] 24-Apr-1986 34 y.o.  Subjective:   Patient ID:  CARNEL STEGMAN is a 34 y.o. (DOB 05/27/1986) male.  Chief Complaint: No chief complaint on file.   HPI Lesean Woolverton Gorman presents to the office today for follow-up of GAD, MDD, insomnia, ADHD, and BPD 1.  Describes mood today as "not good". Mood symptoms - reports increased depression, anxiety, and irritability. Stating "I'm not doing so good right now". Doesn't feel like medication is working as well as it has. Having to force himself to go to work or do things around the house. Reports a recent incident in work setting. Was pointed out by supervisor to wear his mask. Explained to supervisor that he had anxiety and panic attacks and it was "difficult" for him. He replied with "you must have a Jesus card". Stating "I lost it". Was then sent home by supervisor. Returned to work last night, but "struggled". Concerns about returning to work at this time. Does not want to lose his job, but is concerned about mood instability. Has been short with family - "mostly ok at home". Feels like he is "safe" at home. Stating "that's my safety zone". Stable interest and motivation. Taking medications as prescribed. Energy levels stable. Active, has stopped exercising with increased work load.  Enjoys some usual interests. Single. Lives with family - fiance of 3 years and 3 children. Family local and supportive. Racing cars.   Appetite stable. Weight loss - 6 pounds - 201 pounds. Sleeps well most nights. Averages 7 to 8 hours. Focus and concentration stable. Completing tasks. Manages aspects of household. Work going well. Works full-time - 12 to 16 hours a day currently - [employer]. Denies SI or HI. Denies AH or VH.  Medication trials: Xanax, Celexa, Valium    GAD-7     Office Visit from 02/15/2019 in [facility] Office Visit from 01/31/2019 in [facility] Office Visit from 01/25/2019 in [facility]  Total GAD-7 Score 3 11 18     PHQ2-9     Video Visit from 04/29/2020 in [facility] Office Visit from 01/25/2019 in [facility]  PHQ-2 Total Score 0 0  PHQ-9 Total Score 1 --       Review of Systems:  Review of Systems  Musculoskeletal: Negative for gait problem.  Neurological: Negative for tremors.  Psychiatric/Behavioral:       Please refer to HPI    Medications: I have reviewed the patient's current medications.  Current Outpatient Medications  Medication Sig Dispense Refill  . [START ON 05/29/2020] amphetamine-dextroamphetamine (ADDERALL) 20 MG tablet Take 1 tablet (20 mg total) by mouth daily. 30 tablet 0  . [START ON 05/29/2020] amphetamine-dextroamphetamine (ADDERALL) 30 MG tablet Take 1 tablet by mouth 2 (two) times daily. 60 tablet 0  . ARIPiprazole (ABILIFY) 20 MG tablet Take 1 tablet (20 mg total) by mouth daily. 30 tablet 2  . benzonatate (TESSALON) 100 MG capsule Take 1 capsule (100 mg total) by mouth 3 (three) times daily as needed. 30 capsule 0  . busPIRone (BUSPAR) 10 MG tablet Take 1 tablet (10 mg total) by mouth 3 (three) times daily. 90 tablet 2  . clonazePAM (KLONOPIN) 1 MG tablet Take 1 tablet (1 mg total) by mouth 4 (four) times daily as needed for anxiety. 120 tablet 2   No current facility-administered medications for this visit.    Medication Side Effects: None  Allergies:  Allergies  Allergen Reactions  . Hydroxyzine Other (See Comments)    Dizziness, mental fogginess, nausea  . Vicodin [Hydrocodone-Acetaminophen] Hives and Itching    Past Medical History:  Diagnosis Date  . Anxiety   . Chicken pox   . Headache    due to CSF leak  . Heart murmur    "as a child"  . Spinal stenosis     Family History  Problem Relation Age of Onset  . Arthritis Mother   .  Hypertension Mother   . COPD Mother   . Emphysema Mother   . Bipolar disorder Mother   . Arthritis Father   . Diabetes Father   . Bipolar disorder Sister   . Schizophrenia Sister     Social History   Socioeconomic History  . Marital status: Legally Separated    Spouse name: Not on file  . Number of children: Not on file  . Years of education: Not on file  . Highest education level: Not on file  Occupational History  . Occupation: [employer]  Tobacco Use  . Smoking status: Current Every Day Smoker    Packs/day: 1.00    Types: Cigarettes  . Smokeless tobacco: Never Used  Vaping Use  . Vaping Use: Former  Substance and Sexual Activity  . Alcohol use: Not Currently    Comment: occassional  . Drug use: No  . Sexual activity: Yes    Partners: Female    Comment: wife  Other Topics Concern  . Not on file  Social History Narrative  . Not on file   Social Determinants of Health   Financial Resource Strain:   . Difficulty of Paying Living Expenses: Not on file  Food Insecurity:   . Worried About Charity fundraiser in the Last Year: Not on file  . Ran Out of Food in the Last Year: Not on file  Transportation Needs:   . Lack of Transportation (Medical): Not on file  . Lack of Transportation (Non-Medical): Not on file  Physical Activity:   . Days of Exercise per Week: Not on file  . Minutes of Exercise per Session: Not on file  Stress:   . Feeling of Stress : Not on file  Social Connections:   . Frequency of Communication with Friends and Family: Not on file  . Frequency of Social Gatherings with Friends and Family: Not on file  . Attends Religious Services: Not on file  . Active Member of Clubs or Organizations: Not on file  . Attends Archivist Meetings: Not on file  . Marital Status: Not on file  Intimate Partner Violence:   . Fear of Current or Ex-Partner: Not on file  . Emotionally Abused: Not on file  . Physically Abused: Not on file  . Sexually  Abused: Not on file    Past Medical History, Surgical history, Social history, and Family history were reviewed and updated as appropriate.   Please see review of systems for further details on the patient's review from today.   Objective:   Physical Exam:  There were no vitals taken for this visit.  Physical Exam Constitutional:      General: He is not in acute distress. Musculoskeletal:        General: No deformity.  Neurological:     Mental Status: He is alert and oriented to person, place, and time.     Cranial Nerves: No dysarthria.     Coordination: Coordination normal.  Psychiatric:  Attention and Perception: Attention and perception normal. He does not perceive auditory or visual hallucinations.        Mood and Affect: Mood normal. Mood is not anxious or depressed. Affect is not labile, blunt, angry or inappropriate.        Speech: Speech normal.        Behavior: Behavior normal. Behavior is cooperative.        Thought Content: Thought content normal. Thought content is not paranoid or delusional. Thought content does not include homicidal or suicidal ideation. Thought content does not include homicidal or suicidal plan.        Cognition and Memory: Cognition and memory normal.        Judgment: Judgment normal.     Comments: Insight intact     Lab Review:     Component Value Date/Time   NA 140 02/15/2019 0949   K 3.8 02/15/2019 0949   CL 104 02/15/2019 0949   CO2 28 02/15/2019 0949   GLUCOSE 92 02/15/2019 0949   BUN 8 02/15/2019 0949   CREATININE 0.99 02/15/2019 0949   CALCIUM 9.3 02/15/2019 0949   GFRNONAA >60 01/23/2019 1755   GFRAA >60 01/23/2019 1755       Component Value Date/Time   WBC 7.9 02/15/2019 0949   RBC 5.02 02/15/2019 0949   HGB 15.4 02/15/2019 0949   HCT 44.8 02/15/2019 0949   PLT 204.0 02/15/2019 0949   MCV 89.3 02/15/2019 0949   MCH 29.7 01/23/2019 1755   MCHC 34.5 02/15/2019 0949   RDW 13.5 02/15/2019 0949   LYMPHSABS 2.1  02/15/2019 0949   MONOABS 0.5 02/15/2019 0949   EOSABS 0.2 02/15/2019 0949   BASOSABS 0.1 02/15/2019 0949    No results found for: POCLITH, LITHIUM   No results found for: PHENYTOIN, PHENOBARB, VALPROATE, CBMZ   .res Assessment: Plan:    Plan:  Increase Abilify 15mg  to 20mg  daily to target mood instability.  Add Buspar 10mg  TID - 1/2 tab TID x 7 days, then one tab TID Klonopin 1mg  4 times daily. Adderall 30mg  BID  Adderall 20mg  in the afternoon for work - evening shift  ADHD outside testing - tested 39 - adult ADHD in office today. Consider referral for psychological testing.  Out of work 08/25/202 through 06/11/2020. Will discuss a RTW date at next visit.   RTC 2 weeks  Discussed potential metabolic side effects associated with atypical antipsychotics, as well as potential risk for movement side effects. Advised pt to contact office if movement side effects occur.   Discussed potential benefits, risks, and side effects of stimulants with patient to include increased heart rate, palpitations, insomnia, increased anxiety, increased irritability, or decreased appetite.  Instructed patient to contact office if experiencing any significant tolerability issues.  Discussed potential benefits, risk, and side effects of benzodiazepines to include potential risk of tolerance and dependence, as well as possible drowsiness.  Advised patient not to drive if experiencing drowsiness and to take lowest possible effective dose to minimize risk of dependence and tolerance.  Patient advised to contact office with any questions, adverse effects, or acute worsening in signs and symptoms.    Diagnoses and all orders for this visit:  Generalized anxiety disorder -     ARIPiprazole (ABILIFY) 20 MG tablet; Take 1 tablet (20 mg total) by mouth daily. -     busPIRone (BUSPAR) 10 MG tablet; Take 1 tablet (10 mg total) by mouth 3 (three) times daily.  Mood disorder ([facility]) -  ARIPiprazole (ABILIFY)  20 MG tablet; Take 1 tablet (20 mg total) by mouth daily.  Major depressive disorder, recurrent episode, moderate (HCC) -     ARIPiprazole (ABILIFY) 20 MG tablet; Take 1 tablet (20 mg total) by mouth daily.  Attention deficit hyperactivity disorder (ADHD), unspecified ADHD type     Please see After Visit Summary for patient specific instructions.  Future Appointments  Date Time Provider [place]  05/28/2020  9:40 AM Kynzley Dowson, Berdie Ogren, NP CP-CP None  07/03/2020  9:40 AM Shamal Stracener, Berdie Ogren, NP CP-CP None    No orders of the defined types were placed in this encounter.   -------------------------------

## 2020-05-14 NOTE — Telephone Encounter (Signed)
Received FMLA paperwork from [employer]. Given to nurse.

## 2020-05-20 NOTE — Telephone Encounter (Signed)
FMLA completed and signed.  Pt has been called to pick up the forms.  It needs his signature and there is no information on where to send.  He will return it to HR himself.

## 2020-05-28 ENCOUNTER — Ambulatory Visit: Payer: 59 | Admitting: Adult Health

## 2020-06-02 ENCOUNTER — Telehealth: Payer: Self-pay | Admitting: Adult Health

## 2020-06-02 DIAGNOSIS — Z0289 Encounter for other administrative examinations: Secondary | ICD-10-CM

## 2020-06-02 NOTE — Telephone Encounter (Signed)
Noted thank you

## 2020-06-02 NOTE — Telephone Encounter (Signed)
STD forms and records were completed and faxed to [facility]

## 2020-06-04 ENCOUNTER — Telehealth: Payer: Self-pay

## 2020-06-04 DIAGNOSIS — Z0289 Encounter for other administrative examinations: Secondary | ICD-10-CM

## 2020-06-04 NOTE — Telephone Encounter (Signed)
FMLA complete

## 2020-06-04 NOTE — Telephone Encounter (Signed)
Noted thank you

## 2020-06-23 ENCOUNTER — Other Ambulatory Visit: Payer: Self-pay

## 2020-06-23 ENCOUNTER — Encounter (HOSPITAL_COMMUNITY): Payer: Self-pay | Admitting: Emergency Medicine

## 2020-06-23 ENCOUNTER — Emergency Department (HOSPITAL_COMMUNITY): Payer: Managed Care, Other (non HMO)

## 2020-06-23 ENCOUNTER — Emergency Department (HOSPITAL_COMMUNITY)
Admission: EM | Admit: 2020-06-23 | Discharge: 2020-06-23 | Disposition: A | Discharge status: Home / Self Care | Payer: Managed Care, Other (non HMO) | Attending: Emergency Medicine | Admitting: Emergency Medicine

## 2020-06-23 DIAGNOSIS — R202 Paresthesia of skin: Secondary | ICD-10-CM | POA: Diagnosis not present

## 2020-06-23 DIAGNOSIS — F17210 Nicotine dependence, cigarettes, uncomplicated: Secondary | ICD-10-CM | POA: Diagnosis not present

## 2020-06-23 DIAGNOSIS — M542 Cervicalgia: Secondary | ICD-10-CM | POA: Diagnosis not present

## 2020-06-23 DIAGNOSIS — X501XXA Overexertion from prolonged static or awkward postures, initial encounter: Secondary | ICD-10-CM | POA: Diagnosis not present

## 2020-06-23 DIAGNOSIS — M546 Pain in thoracic spine: Secondary | ICD-10-CM | POA: Diagnosis not present

## 2020-06-23 DIAGNOSIS — M5441 Lumbago with sciatica, right side: Secondary | ICD-10-CM | POA: Insufficient documentation

## 2020-06-23 DIAGNOSIS — Y990 Civilian activity done for income or pay: Secondary | ICD-10-CM | POA: Insufficient documentation

## 2020-06-23 LAB — BASIC METABOLIC PANEL
Anion gap: 11 (ref 5–15)
BUN: 11 mg/dL (ref 6–20)
CO2: 26 mmol/L (ref 22–32)
Calcium: 9.2 mg/dL (ref 8.9–10.3)
Chloride: 99 mmol/L (ref 98–111)
Creatinine, Ser: 0.97 mg/dL (ref 0.61–1.24)
GFR calc Af Amer: >60 mL/min (ref >60)
GFR calc non Af Amer: >60 mL/min (ref >60)
Glucose, Bld: 96 mg/dL (ref 70–99)
Potassium: 3.9 mmol/L (ref 3.5–5.1)
Sodium: 136 mmol/L (ref 135–145)

## 2020-06-23 LAB — CBC WITH DIFFERENTIAL/PLATELET
Abs Immature Granulocytes: 0.09 10*3/uL — ABNORMAL HIGH (ref 0.00–0.07)
Basophils Absolute: 0.1 10*3/uL (ref 0.0–0.1)
Basophils Relative: 1 %
Eosinophils Absolute: 0.4 10*3/uL (ref 0.0–0.5)
Eosinophils Relative: 3 %
HCT: 47.1 % (ref 39.0–52.0)
Hemoglobin: 15.8 g/dL (ref 13.0–17.0)
Immature Granulocytes: 1 %
Lymphocytes Relative: 26 %
Lymphs Abs: 3 10*3/uL (ref 0.7–4.0)
MCH: 30.6 pg (ref 26.0–34.0)
MCHC: 33.5 g/dL (ref 30.0–36.0)
MCV: 91.3 fL (ref 80.0–100.0)
Monocytes Absolute: 0.7 10*3/uL (ref 0.1–1.0)
Monocytes Relative: 6 %
Neutro Abs: 7.2 10*3/uL (ref 1.7–7.7)
Neutrophils Relative %: 63 %
Platelets: 254 10*3/uL (ref 150–400)
RBC: 5.16 MIL/uL (ref 4.22–5.81)
RDW: 13.1 % (ref 11.5–15.5)
WBC: 11.3 10*3/uL — ABNORMAL HIGH (ref 4.0–10.5)
nRBC: 0 % (ref 0.0–0.2)

## 2020-06-23 MED ORDER — ONDANSETRON HCL 4 MG/2ML IJ SOLN
4.0000 mg | Freq: Once | INTRAMUSCULAR | Status: AC
  Administered 2020-06-23: 4 mg via INTRAVENOUS
  Filled 2020-06-23: qty 2

## 2020-06-23 MED ORDER — GADOBUTROL 1 MMOL/ML IV SOLN
10.0000 mL | Freq: Once | INTRAVENOUS | Status: AC | PRN
  Administered 2020-06-23: 10 mL via INTRAVENOUS

## 2020-06-23 MED ORDER — METHYLPREDNISOLONE SODIUM SUCC 125 MG IJ SOLR
125.0000 mg | Freq: Once | INTRAMUSCULAR | Status: AC
  Administered 2020-06-23: 125 mg via INTRAVENOUS
  Filled 2020-06-23: qty 2

## 2020-06-23 MED ORDER — HYDROMORPHONE HCL 1 MG/ML IJ SOLN
1.0000 mg | Freq: Once | INTRAMUSCULAR | Status: AC
  Administered 2020-06-23: 1 mg via INTRAVENOUS
  Filled 2020-06-23: qty 1

## 2020-06-23 MED ORDER — OXYCODONE-ACETAMINOPHEN 5-325 MG PO TABS
1.0000 | ORAL_TABLET | Freq: Four times a day (QID) | ORAL | 0 refills | Status: DC | PRN
Start: 2020-06-23 — End: 2023-08-11

## 2020-06-23 MED ORDER — FENTANYL CITRATE (PF) 100 MCG/2ML IJ SOLN
50.0000 ug | Freq: Once | INTRAMUSCULAR | Status: AC
  Administered 2020-06-23: 50 ug via INTRAVENOUS
  Filled 2020-06-23: qty 2

## 2020-06-23 NOTE — Discharge Instructions (Signed)
Call and make an appointment this week to see either Dr. Annette Stable or Dr. Zada Finders.  No lifting over 20 pounds

## 2020-06-23 NOTE — ED Triage Notes (Signed)
Patient states he was at work a few hours ago and lifting eighty pound pallets when he started having pain in is back and a sharp pain in his neck. Patient states numbness and tingling down his right arm and fingers. Patient also complains of pain and numbness in to his right leg. Patient is having some pain on the left side of his neck and back.

## 2020-06-23 NOTE — ED Notes (Signed)
Patient transported to MRI 

## 2020-06-23 NOTE — ED Provider Notes (Signed)
[facility] EMERGENCY DEPARTMENT Provider Note   CSN: [id number] Arrival date & time: 06/23/20  0117   Time seen 5:29 AM  History Chief Complaint  Patient presents with  . Back Injury    Jay Montgomery is a 34 y.o. male.  HPI   Patient states he has been doing his current job for about 5 years.  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  He states about midnight however he was carrying a single box on his right shoulder and he had walked to put it somewhere else and he had acute onset of pain in his neck and in his lower back.  He states since that happened he has had numbness in the fingertips of both hands and in the toes of his right foot.  He did not have any symptoms in the left foot.  He has not had to urinate since this happen so he does not describe urinary or rectal incontinence.  He states he has had 2 surgeries of his back in the past.  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  Looking at his chart he had a L4-5 decompressive laminectomy done by Dr. 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 Trenton Gammon.  Patient is right-handed.  PCP Delorse Limber   Past Medical History:  Diagnosis Date  . Anxiety   . Chicken pox   . Headache    due to CSF leak  . Heart murmur    "as a child"  . Spinal stenosis     Patient Active Problem List   Diagnosis Date Noted  . Bradycardia 05/18/2019  . Syncope and collapse 05/18/2019  . GAD (generalized anxiety disorder) 01/25/2019  . Postoperative CSF leak 12/01/2017  . Spinal stenosis of lumbar region 02/15/2017    Past Surgical History:  Procedure Laterality Date  . ADENOIDECTOMY    . BACK SURGERY  08/17/2017   L4-L5  . LUMBAR LAMINECTOMY/DECOMPRESSION MICRODISCECTOMY N/A 12/01/2017   Procedure: REPAIR OF CEREBROSPINAL FLUID LEAK and  Placement of Lumbar Drain;  Surgeon: Earnie Larsson, MD;  Location: [place];  Service: Neurosurgery;  Laterality: N/A;  . TONSILLECTOMY         Family History  Problem Relation Age of Onset  . Arthritis Mother   . Hypertension Mother   . COPD Mother   . Emphysema Mother   . Bipolar disorder Mother   . Arthritis Father   . Diabetes Father   . Bipolar disorder Sister   . Schizophrenia Sister     Social History   Tobacco Use  . Smoking status: Current Every Day Smoker    Packs/day: 1.00    Types: Cigarettes  . Smokeless tobacco: Never Used  Vaping Use  . Vaping Use: Former  Substance Use Topics  . Alcohol use: Not Currently    Comment: occassional  . Drug use: No  Employed  Home Medications Prior to Admission medications   Medication Sig Start Date End Date Taking? Authorizing Provider  amphetamine-dextroamphetamine (ADDERALL) 20 MG tablet Take 1 tablet (20 mg total) by mouth daily. 05/29/20   Mozingo, Berdie Ogren, NP  amphetamine-dextroamphetamine (ADDERALL) 30 MG tablet Take 1 tablet by mouth 2 (two) times daily. 05/29/20   Mozingo, Berdie Ogren, NP  ARIPiprazole (ABILIFY) 20 MG tablet Take 1  tablet (20 mg total) by mouth daily. 05/14/20   Mozingo, Berdie Ogren, NP  benzonatate (TESSALON) 100 MG capsule Take 1 capsule (100 mg total) by mouth 3 (three) times daily as needed. 04/29/20   Brunetta Jeans, PA-C  busPIRone (BUSPAR) 10 MG tablet Take 1 tablet (10 mg total) by mouth 3 (three) times daily. 05/14/20   Mozingo, Berdie Ogren, NP  clonazePAM (KLONOPIN) 1 MG tablet Take 1 tablet (1 mg total) by mouth 4 (four) times daily as needed for anxiety. 04/03/20   Mozingo, Berdie Ogren, NP    Allergies    Hydroxyzine and Vicodin [hydrocodone-acetaminophen]  Review of Systems   Review of Systems  All other systems reviewed and are negative.   Physical Exam Updated Vital Signs BP 131/83 (BP Location: Right Arm)   Pulse (!) 110   Temp 98.3 F (36.8 C) (Oral)    Resp 18   Ht 6\' 3"  (1.905 m)   Wt 97.5 kg   SpO2 99%   BMI 26.87 kg/m   Physical Exam Vitals and nursing note reviewed.  Constitutional:      Appearance: Normal appearance. He is normal weight.  HENT:     Head: Normocephalic and atraumatic.     Right Ear: External ear normal.     Left Ear: External ear normal.  Eyes:     Extraocular Movements: Extraocular movements intact.     Conjunctiva/sclera: Conjunctivae normal.  Cardiovascular:     Rate and Rhythm: Normal rate.  Pulmonary:     Effort: Pulmonary effort is normal. No respiratory distress.     Breath sounds: Normal breath sounds.  Musculoskeletal:     Cervical back: Normal range of motion. Tenderness present.     Comments: Patient has diffuse cervical spine tenderness in the midline.  He also has some tenderness in his upper thoracic spine that seems most tender compared to the other areas.  He also has some tenderness of his mid to lower lumbar spine in the midline.  He has mild discomfort to palpation of the right paraspinous muscles and none on the left.  Skin:    General: Skin is warm and dry.  Neurological:     General: No focal deficit present.     Mental Status: He is alert and oriented to person, place, and time. Mental status is at baseline.     Comments: Grips are equal.  He complains of tingling of his fingertips.  He has good range of motion of his fingers.  His reflexes are depressed diffusely except for his patellar reflexes on the left.  The brachioradialis is depressed and the patellar on the right.  Psychiatric:        Mood and Affect: Mood normal.        Behavior: Behavior normal.        Thought Content: Thought content normal.     ED Results / Procedures / Treatments   Labs (all labs ordered are listed, but only abnormal results are displayed) Results for orders placed or performed during the hospital encounter of 62/69/48  Basic metabolic panel  Result Value Ref Range   Sodium 136 135 - 145 mmol/L     Potassium 3.9 3.5 - 5.1 mmol/L   Chloride 99 98 - 111 mmol/L   CO2 26 22 - 32 mmol/L   Glucose, Bld 96 70 - 99 mg/dL   BUN 11 6 - 20 mg/dL   Creatinine, Ser 0.97 0.61 - 1.24 mg/dL   Calcium 9.2 8.9 -  10.3 mg/dL   GFR calc non Af Amer >60 >60 mL/min   GFR calc Af Amer >60 >60 mL/min   Anion gap 11 5 - 15  CBC with Differential  Result Value Ref Range   WBC 11.3 (H) 4.0 - 10.5 K/uL   RBC 5.16 4.22 - 5.81 MIL/uL   Hemoglobin 15.8 13.0 - 17.0 g/dL   HCT 47.1 39 - 52 %   MCV 91.3 80.0 - 100.0 fL   MCH 30.6 26.0 - 34.0 pg   MCHC 33.5 30.0 - 36.0 g/dL   RDW 13.1 11.5 - 15.5 %   Platelets 254 150 - 400 K/uL   nRBC 0.0 0.0 - 0.2 %   Neutrophils Relative % 63 %   Neutro Abs 7.2 1.7 - 7.7 K/uL   Lymphocytes Relative 26 %   Lymphs Abs 3.0 0.7 - 4.0 K/uL   Monocytes Relative 6 %   Monocytes Absolute 0.7 0 - 1 K/uL   Eosinophils Relative 3 %   Eosinophils Absolute 0.4 0 - 0 K/uL   Basophils Relative 1 %   Basophils Absolute 0.1 0 - 0 K/uL   Immature Granulocytes 1 %   Abs Immature Granulocytes 0.09 (H) 0.00 - 0.07 K/uL   Laboratory interpretation all normal except leukocytosis    EKG None  Radiology No results found.  Procedures Procedures (including critical care time)  Medications Ordered in ED Medications  ondansetron (ZOFRAN) injection 4 mg (4 mg Intravenous Given 06/23/20 0623)  fentaNYL (SUBLIMAZE) injection 50 mcg (50 mcg Intravenous Given 06/23/20 1610)    ED Course  I have reviewed the triage vital signs and the nursing notes.  Pertinent labs & imaging results that were available during my care of the patient were reviewed by me and considered in my medical decision making (see chart for details).    MDM Rules/Calculators/A&P                         Due to the nature of the patient's complaints I felt that MRI would be more useful than a CT scan.  At the time of my exam they would be coming to the hospital in about an hour.  Patient and his wife are okay with  waiting to do the MRI.  He was given fentanyl for pain and Zofran for nausea.  Patient was left with Dr. Roderic Palau to get the results of his MRI scans.  Final Clinical Impression(s) / ED Diagnoses Final diagnoses:  Acute neck pain  Acute midline thoracic back pain  Acute midline low back pain with right-sided sciatica  Paresthesia of both hands  Paresthesia of right foot    Rx / DC Orders  Disposition pending  Rolland Porter, MD, Barbette Or, MD 06/23/20 [phone]

## 2020-07-03 ENCOUNTER — Encounter: Payer: Self-pay | Admitting: Adult Health

## 2020-07-03 ENCOUNTER — Other Ambulatory Visit: Payer: Self-pay

## 2020-07-03 ENCOUNTER — Ambulatory Visit (INDEPENDENT_AMBULATORY_CARE_PROVIDER_SITE_OTHER): Payer: 59 | Admitting: Adult Health

## 2020-07-03 DIAGNOSIS — F909 Attention-deficit hyperactivity disorder, unspecified type: Secondary | ICD-10-CM | POA: Diagnosis not present

## 2020-07-03 DIAGNOSIS — F411 Generalized anxiety disorder: Secondary | ICD-10-CM | POA: Diagnosis not present

## 2020-07-03 DIAGNOSIS — F39 Unspecified mood [affective] disorder: Secondary | ICD-10-CM

## 2020-07-03 DIAGNOSIS — F331 Major depressive disorder, recurrent, moderate: Secondary | ICD-10-CM | POA: Diagnosis not present

## 2020-07-03 MED ORDER — ARIPIPRAZOLE 20 MG PO TABS: 20.0000 mg | ORAL_TABLET | Freq: Every day | ORAL | 2 refills | Status: DC

## 2020-07-03 MED ORDER — AMPHETAMINE-DEXTROAMPHETAMINE 20 MG PO TABS: 20.0000 mg | ORAL_TABLET | Freq: Every day | ORAL | 0 refills | Status: DC

## 2020-07-03 MED ORDER — AMPHETAMINE-DEXTROAMPHETAMINE 30 MG PO TABS: 30.0000 mg | ORAL_TABLET | Freq: Two times a day (BID) | ORAL | 0 refills | Status: DC

## 2020-07-03 MED ORDER — AMPHETAMINE-DEXTROAMPHETAMINE 30 MG PO TABS: 30.0000 mg | ORAL_TABLET | Freq: Every day | ORAL | 0 refills | Status: DC

## 2020-07-03 MED ORDER — CLONAZEPAM 1 MG PO TABS: 1.0000 mg | ORAL_TABLET | Freq: Four times a day (QID) | ORAL | 2 refills | Status: DC | PRN

## 2020-07-03 MED ORDER — BUSPIRONE HCL 10 MG PO TABS: 10.0000 mg | ORAL_TABLET | Freq: Three times a day (TID) | ORAL | 2 refills | Status: DC

## 2020-07-03 NOTE — Progress Notes (Signed)
Jay Montgomery [id number] 1986/05/25 34 y.o.  Subjective:   Patient ID:  Jay Montgomery is a 34 y.o. (DOB Nov 18, 1985) male.  Chief Complaint: No chief complaint on file.   HPI Jay Montgomery presents to the office today for follow-up of GAD, MDD, insomnia, ADHD, and BPD 1.  Describes mood today as "better". Pleasant. Mood symptoms - reports decreased depression, anxiety, and irritability. Stating "I'm doing ok". Returned to work in September. Feels like medication adjustments have been helpful. Denies any outbursts or blow-ups. Stating "I'm just staying to myself and doing what I'm supposed to do". Recent trip to [facility] with family. Stable interest and motivation. Taking medications as prescribed. Energy levels stable. Active, does not have a regular exercise routine. Enjoys some usual interests. Single. Lives with family - fiance and 3 children. Family local and supportive.  Appetite increased. Weight gain - 225 pounds. Sleeps well most nights. Averages 4 to 6 hours.  Focus and concentration stable. Completing tasks. Manages aspects of household. Work going well. Works full-time -  Public house manager. Denies SI or HI. Denies AH or VH.  Medication trials: Xanax, Celexa, Valium    GAD-7     Office Visit from 02/15/2019 in [place] Office Visit from 01/31/2019 in [place] Office Visit from 01/25/2019 in [place]  Total GAD-7 Score 3 11 18     PHQ2-9     Video Visit from 04/29/2020 in [facility] [place] Office Visit from 01/25/2019 in [place]  PHQ-2 Total Score 0 0  PHQ-9 Total Score 1 --       Review of Systems:  Review of Systems  Musculoskeletal: Negative for gait problem.  Neurological: Negative for tremors.  Psychiatric/Behavioral:       Please refer to HPI     Medications: I have reviewed the patient's current medications.  Current Outpatient Medications  Medication Sig Dispense Refill  . amphetamine-dextroamphetamine (ADDERALL) 20 MG tablet Take 1 tablet (20 mg total) by mouth daily. 30 tablet 0  . [START ON 07/31/2020] amphetamine-dextroamphetamine (ADDERALL) 20 MG tablet Take 1 tablet (20 mg total) by mouth daily. 30 tablet 0  . [START ON 08/28/2020] amphetamine-dextroamphetamine (ADDERALL) 20 MG tablet Take 1 tablet (20 mg total) by mouth daily. 30 tablet 0  . amphetamine-dextroamphetamine (ADDERALL) 30 MG tablet Take 1 tablet by mouth 2 (two) times daily. 60 tablet 0  . [START ON 07/31/2020] amphetamine-dextroamphetamine (ADDERALL) 30 MG tablet Take 1 tablet by mouth 2 (two) times daily. 60 tablet 0  . [START ON 08/28/2020] amphetamine-dextroamphetamine (ADDERALL) 30 MG tablet Take 1 tablet by mouth daily. 60 tablet 0  . ARIPiprazole (ABILIFY) 20 MG tablet Take 1 tablet (20 mg total) by mouth daily. 30 tablet 2  . benzonatate (TESSALON) 100 MG capsule Take 1 capsule (100 mg total) by mouth 3 (three) times daily as needed. 30 capsule 0  . busPIRone (BUSPAR) 10 MG tablet Take 1 tablet (10 mg total) by mouth 3 (three) times daily. 90 tablet 2  . clonazePAM (KLONOPIN) 1 MG tablet Take 1 tablet (1 mg total) by mouth 4 (four) times daily as needed for anxiety. 120 tablet 2  . oxyCODONE-acetaminophen (PERCOCET/ROXICET) 5-325 MG tablet Take 1 tablet by mouth every 6 (six) hours as needed. 20 tablet 0   No current facility-administered medications for this visit.    Medication Side Effects: None  Allergies:  Allergies  Allergen Reactions  . Hydroxyzine  Other (See Comments)    Dizziness, mental fogginess, nausea  . Vicodin [Hydrocodone-Acetaminophen] Hives and Itching    Past Medical History:  Diagnosis Date  . Anxiety   . Chicken pox   . Headache    due to CSF leak  . Heart murmur    "as a child"  . Spinal stenosis     Family  History  Problem Relation Age of Onset  . Arthritis Mother   . Hypertension Mother   . COPD Mother   . Emphysema Mother   . Bipolar disorder Mother   . Arthritis Father   . Diabetes Father   . Bipolar disorder Sister   . Schizophrenia Sister     Social History   Socioeconomic History  . Marital status: Legally Separated    Spouse name: Not on file  . Number of children: Not on file  . Years of education: Not on file  . Highest education level: Not on file  Occupational History  . Occupation: [employer]  Tobacco Use  . Smoking status: Current Every Day Smoker    Packs/day: 1.00    Types: Cigarettes  . Smokeless tobacco: Never Used  Vaping Use  . Vaping Use: Former  Substance and Sexual Activity  . Alcohol use: Not Currently    Comment: occassional  . Drug use: No  . Sexual activity: Yes    Partners: Female    Comment: wife  Other Topics Concern  . Not on file  Social History Narrative  . Not on file   Social Determinants of Health   Financial Resource Strain:   . Difficulty of Paying Living Expenses: Not on file  Food Insecurity:   . Worried About Charity fundraiser in the Last Year: Not on file  . Ran Out of Food in the Last Year: Not on file  Transportation Needs:   . Lack of Transportation (Medical): Not on file  . Lack of Transportation (Non-Medical): Not on file  Physical Activity:   . Days of Exercise per Week: Not on file  . Minutes of Exercise per Session: Not on file  Stress:   . Feeling of Stress : Not on file  Social Connections:   . Frequency of Communication with Friends and Family: Not on file  . Frequency of Social Gatherings with Friends and Family: Not on file  . Attends Religious Services: Not on file  . Active Member of Clubs or Organizations: Not on file  . Attends Archivist Meetings: Not on file  . Marital Status: Not on file  Intimate Partner Violence:   . Fear of Current or Ex-Partner: Not on file  . Emotionally  Abused: Not on file  . Physically Abused: Not on file  . Sexually Abused: Not on file    Past Medical History, Surgical history, Social history, and Family history were reviewed and updated as appropriate.   Please see review of systems for further details on the patient's review from today.   Objective:   Physical Exam:  There were no vitals taken for this visit.  Physical Exam Constitutional:      General: He is not in acute distress. Musculoskeletal:        General: No deformity.  Neurological:     Mental Status: He is alert and oriented to person, place, and time.     Coordination: Coordination normal.  Psychiatric:        Attention and Perception: Attention and perception normal. He does not perceive  auditory or visual hallucinations.        Mood and Affect: Mood normal. Mood is not anxious or depressed. Affect is not labile, blunt, angry or inappropriate.        Speech: Speech normal.        Behavior: Behavior normal.        Thought Content: Thought content normal. Thought content is not paranoid or delusional. Thought content does not include homicidal or suicidal ideation. Thought content does not include homicidal or suicidal plan.        Cognition and Memory: Cognition and memory normal.        Judgment: Judgment normal.     Comments: Insight intact     Lab Review:     Component Value Date/Time   NA 136 06/23/2020 0620   K 3.9 06/23/2020 0620   CL 99 06/23/2020 0620   CO2 26 06/23/2020 0620   GLUCOSE 96 06/23/2020 0620   BUN 11 06/23/2020 0620   CREATININE 0.97 06/23/2020 0620   CALCIUM 9.2 06/23/2020 0620   GFRNONAA >60 06/23/2020 0620   GFRAA >60 06/23/2020 0620       Component Value Date/Time   WBC 11.3 (H) 06/23/2020 0620   RBC 5.16 06/23/2020 0620   HGB 15.8 06/23/2020 0620   HCT 47.1 06/23/2020 0620   PLT 254 06/23/2020 0620   MCV 91.3 06/23/2020 0620   MCH 30.6 06/23/2020 0620   MCHC 33.5 06/23/2020 0620   RDW 13.1 06/23/2020 0620    LYMPHSABS 3.0 06/23/2020 0620   MONOABS 0.7 06/23/2020 0620   EOSABS 0.4 06/23/2020 0620   BASOSABS 0.1 06/23/2020 0620    No results found for: POCLITH, LITHIUM   No results found for: PHENYTOIN, PHENOBARB, VALPROATE, CBMZ   .res Assessment: Plan:    Plan:  Abilify 20mg  daily to target mood instability.  Buspar 10mg  TID  Klonopin 1mg  4 times daily. Adderall 30mg  BID  Adderall 20mg  in the afternoon for work - evening shift  ADHD outside testing - tested 39 - adult ADHD in office today. Consider referral for psychological testing.  RTC 3 months  Discussed potential metabolic side effects associated with atypical antipsychotics, as well as potential risk for movement side effects. Advised pt to contact office if movement side effects occur.   Discussed potential benefits, risks, and side effects of stimulants with patient to include increased heart rate, palpitations, insomnia, increased anxiety, increased irritability, or decreased appetite.  Instructed patient to contact office if experiencing any significant tolerability issues.  Discussed potential benefits, risk, and side effects of benzodiazepines to include potential risk of tolerance and dependence, as well as possible drowsiness.  Advised patient not to drive if experiencing drowsiness and to take lowest possible effective dose to minimize risk of dependence and tolerance.  Patient advised to contact office with any questions, adverse effects, or acute worsening in signs and symptoms.    Diagnoses and all orders for this visit:  Generalized anxiety disorder -     clonazePAM (KLONOPIN) 1 MG tablet; Take 1 tablet (1 mg total) by mouth 4 (four) times daily as needed for anxiety. -     busPIRone (BUSPAR) 10 MG tablet; Take 1 tablet (10 mg total) by mouth 3 (three) times daily. -     ARIPiprazole (ABILIFY) 20 MG tablet; Take 1 tablet (20 mg total) by mouth daily.  Mood disorder (HCC) -     ARIPiprazole (ABILIFY) 20 MG  tablet; Take 1 tablet (20 mg total) by mouth daily.  Major depressive disorder, recurrent episode, moderate (HCC) -     ARIPiprazole (ABILIFY) 20 MG tablet; Take 1 tablet (20 mg total) by mouth daily.  Attention deficit hyperactivity disorder (ADHD), unspecified ADHD type -     amphetamine-dextroamphetamine (ADDERALL) 20 MG tablet; Take 1 tablet (20 mg total) by mouth daily. -     amphetamine-dextroamphetamine (ADDERALL) 30 MG tablet; Take 1 tablet by mouth 2 (two) times daily. -     amphetamine-dextroamphetamine (ADDERALL) 30 MG tablet; Take 1 tablet by mouth 2 (two) times daily. -     amphetamine-dextroamphetamine (ADDERALL) 30 MG tablet; Take 1 tablet by mouth daily. -     amphetamine-dextroamphetamine (ADDERALL) 20 MG tablet; Take 1 tablet (20 mg total) by mouth daily. -     amphetamine-dextroamphetamine (ADDERALL) 20 MG tablet; Take 1 tablet (20 mg total) by mouth daily.     Please see After Visit Summary for patient specific instructions.  Future Appointments  Date Time Provider [place]  10/02/2020  9:40 AM Anndee Connett, Berdie Ogren, NP CP-CP None    No orders of the defined types were placed in this encounter.   -------------------------------

## 2020-10-02 ENCOUNTER — Ambulatory Visit: Payer: 59 | Admitting: Adult Health

## 2020-10-24 ENCOUNTER — Ambulatory Visit (INDEPENDENT_AMBULATORY_CARE_PROVIDER_SITE_OTHER): Payer: 59 | Admitting: Adult Health

## 2020-10-24 ENCOUNTER — Encounter: Payer: Self-pay | Admitting: Adult Health

## 2020-10-24 ENCOUNTER — Other Ambulatory Visit: Payer: Self-pay

## 2020-10-24 DIAGNOSIS — F39 Unspecified mood [affective] disorder: Secondary | ICD-10-CM

## 2020-10-24 DIAGNOSIS — F319 Bipolar disorder, unspecified: Secondary | ICD-10-CM

## 2020-10-24 DIAGNOSIS — F331 Major depressive disorder, recurrent, moderate: Secondary | ICD-10-CM

## 2020-10-24 DIAGNOSIS — F909 Attention-deficit hyperactivity disorder, unspecified type: Secondary | ICD-10-CM | POA: Diagnosis not present

## 2020-10-24 DIAGNOSIS — F411 Generalized anxiety disorder: Secondary | ICD-10-CM

## 2020-10-24 DIAGNOSIS — G4700 Insomnia, unspecified: Secondary | ICD-10-CM | POA: Diagnosis not present

## 2020-10-24 MED ORDER — AMPHETAMINE-DEXTROAMPHETAMINE 20 MG PO TABS: 20.0000 mg | ORAL_TABLET | Freq: Every day | ORAL | 0 refills | Status: DC

## 2020-10-24 MED ORDER — CLONAZEPAM 1 MG PO TABS: 1.0000 mg | ORAL_TABLET | Freq: Four times a day (QID) | ORAL | 2 refills | Status: DC | PRN

## 2020-10-24 MED ORDER — BUSPIRONE HCL 10 MG PO TABS: 10.0000 mg | ORAL_TABLET | Freq: Three times a day (TID) | ORAL | 2 refills | Status: DC

## 2020-10-24 MED ORDER — AMPHETAMINE-DEXTROAMPHETAMINE 30 MG PO TABS: 30.0000 mg | ORAL_TABLET | Freq: Two times a day (BID) | ORAL | 0 refills | Status: DC

## 2020-10-24 MED ORDER — ARIPIPRAZOLE 20 MG PO TABS: 20.0000 mg | ORAL_TABLET | Freq: Every day | ORAL | 2 refills | Status: DC

## 2020-10-24 MED ORDER — AMPHETAMINE-DEXTROAMPHETAMINE 30 MG PO TABS: 30.0000 mg | ORAL_TABLET | Freq: Every day | ORAL | 0 refills | Status: DC

## 2020-10-24 NOTE — Progress Notes (Signed)
Jay Montgomery [id number] Sep 03, 1986 35 y.o.  Subjective:   Patient ID:  Jay Montgomery is a 35 y.o. (DOB 04/05/1986) male.  Chief Complaint: No chief complaint on file.   HPI Jay Montgomery presents to the office today for follow-up of GAD, MDD, insomnia, ADHD, and BPD 1.  Describes mood today as "ok". Pleasant. Mood symptoms - reports decreased depression, anxiety, and irritability. Stating "I've been doing pretty good". Quit job at [employer]. Starting new job next week at [employer]. 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 Feels like current medications are working well for him. Feels like Buspar is giving him a dry mouth when he wakes up in the mornings. Stable interest and motivation. Taking medications as prescribed. Energy levels stable. Active, does not have a regular exercise routine. Enjoys some usual interests. Single. Lives with family - fiance and 3 children. Family local and supportive.  Appetite increased. Weight gain - 225 pounds. Sleeps well most nights. Averages 4 to 6 hours.  Focus and concentration stable. Completing tasks. Manages aspects of household. Work going well. Works full-time -  Public house manager. Denies SI or HI.  Denies AH or VH.  Medication trials: Xanax, Celexa, Valium   GAD-7   Flowsheet Row Office Visit from 02/15/2019 in [facility] Office Visit from 01/31/2019 in [place] Office Visit from 01/25/2019 in [facility]  Total GAD-7 Score 3 11 18     PHQ2-9   [place] Video Visit from 04/29/2020 in [facility] Office Visit from 01/25/2019 in [place]  PHQ-2 Total Score 0 0  PHQ-9 Total Score 1 --       Review of Systems:  Review of Systems  Musculoskeletal: Negative for gait problem.   Neurological: Negative for tremors.  Psychiatric/Behavioral:       Please refer to HPI    Medications: I have reviewed the patient's current medications.  Current Outpatient Medications  Medication Sig Dispense Refill  . amphetamine-dextroamphetamine (ADDERALL) 20 MG tablet Take 1 tablet (20 mg total) by mouth daily. 30 tablet 0  . [START ON 11/21/2020] amphetamine-dextroamphetamine (ADDERALL) 20 MG tablet Take 1 tablet (20 mg total) by mouth daily. 30 tablet 0  . [START ON 12/19/2020] amphetamine-dextroamphetamine (ADDERALL) 20 MG tablet Take 1 tablet (20 mg total) by mouth daily. 30 tablet 0  . amphetamine-dextroamphetamine (ADDERALL) 30 MG tablet Take 1 tablet by mouth daily. 60 tablet 0  . [START ON 11/21/2020] amphetamine-dextroamphetamine (ADDERALL) 30 MG tablet Take 1 tablet by mouth 2 (two) times daily. 60 tablet 0  . [START ON 12/19/2020] amphetamine-dextroamphetamine (ADDERALL) 30 MG tablet Take 1 tablet by mouth 2 (two) times daily. 60 tablet 0  . ARIPiprazole (ABILIFY) 20 MG tablet Take 1 tablet (20 mg total) by mouth daily. 30 tablet 2  . benzonatate (TESSALON) 100 MG capsule Take 1 capsule (100 mg total) by mouth 3 (three) times daily as needed. 30 capsule 0  . busPIRone (BUSPAR) 10 MG tablet Take 1 tablet (10 mg total) by mouth 3 (three) times daily. 90 tablet 2  . clonazePAM (KLONOPIN) 1 MG tablet Take 1 tablet (1 mg total) by mouth 4 (four) times daily as needed for anxiety. 120 tablet 2  . oxyCODONE-acetaminophen (PERCOCET/ROXICET) 5-325 MG tablet Take 1 tablet by mouth every 6 (six) hours as needed. 20 tablet 0   No current facility-administered medications for this visit.  Medication Side Effects: None  Allergies:  Allergies  Allergen Reactions  . Hydroxyzine Other (See Comments)    Dizziness, mental fogginess, nausea  . Vicodin [Hydrocodone-Acetaminophen] Hives and Itching    Past Medical History:  Diagnosis Date  . Anxiety   . Chicken pox   . Headache    due  to CSF leak  . Heart murmur    "as a child"  . Spinal stenosis     Family History  Problem Relation Age of Onset  . Arthritis Mother   . Hypertension Mother   . COPD Mother   . Emphysema Mother   . Bipolar disorder Mother   . Arthritis Father   . Diabetes Father   . Bipolar disorder Sister   . Schizophrenia Sister     Social History   Socioeconomic History  . Marital status: Legally Separated    Spouse name: Not on file  . Number of children: Not on file  . Years of education: Not on file  . Highest education level: Not on file  Occupational History  . Occupation: [employer]  Tobacco Use  . Smoking status: Current Every Day Smoker    Packs/day: 1.00    Types: Cigarettes  . Smokeless tobacco: Never Used  Vaping Use  . Vaping Use: Former  Substance and Sexual Activity  . Alcohol use: Not Currently    Comment: occassional  . Drug use: No  . Sexual activity: Yes    Partners: Female    Comment: wife  Other Topics Concern  . Not on file  Social History Narrative  . Not on file   Social Determinants of Health   Financial Resource Strain: Not on file  Food Insecurity: Not on file  Transportation Needs: Not on file  Physical Activity: Not on file  Stress: Not on file  Social Connections: Not on file  Intimate Partner Violence: Not on file    Past Medical History, Surgical history, Social history, and Family history were reviewed and updated as appropriate.   Please see review of systems for further details on the patient's review from today.   Objective:   Physical Exam:  There were no vitals taken for this visit.  Physical Exam Constitutional:      General: He is not in acute distress. Musculoskeletal:        General: No deformity.  Neurological:     Mental Status: He is alert and oriented to person, place, and time.     Coordination: Coordination normal.  Psychiatric:        Attention and Perception: Attention and perception normal. He does not  perceive auditory or visual hallucinations.        Mood and Affect: Mood normal. Mood is not anxious or depressed. Affect is not labile, blunt, angry or inappropriate.        Speech: Speech normal.        Behavior: Behavior normal.        Thought Content: Thought content normal. Thought content is not paranoid or delusional. Thought content does not include homicidal or suicidal ideation. Thought content does not include homicidal or suicidal plan.        Cognition and Memory: Cognition and memory normal.        Judgment: Judgment normal.     Comments: Insight intact     Lab Review:     Component Value Date/Time   NA 136 06/23/2020 0620   K 3.9 06/23/2020 0620   CL 99 06/23/2020 [id number]  CO2 26 06/23/2020 0620   GLUCOSE 96 06/23/2020 0620   BUN 11 06/23/2020 0620   CREATININE 0.97 06/23/2020 0620   CALCIUM 9.2 06/23/2020 0620   GFRNONAA >60 06/23/2020 0620   GFRAA >60 06/23/2020 0620       Component Value Date/Time   WBC 11.3 (H) 06/23/2020 0620   RBC 5.16 06/23/2020 0620   HGB 15.8 06/23/2020 0620   HCT 47.1 06/23/2020 0620   PLT 254 06/23/2020 0620   MCV 91.3 06/23/2020 0620   MCH 30.6 06/23/2020 0620   MCHC 33.5 06/23/2020 0620   RDW 13.1 06/23/2020 0620   LYMPHSABS 3.0 06/23/2020 0620   MONOABS 0.7 06/23/2020 0620   EOSABS 0.4 06/23/2020 0620   BASOSABS 0.1 06/23/2020 0620    No results found for: POCLITH, LITHIUM   No results found for: PHENYTOIN, PHENOBARB, VALPROATE, CBMZ   .res Assessment: Plan:    Plan:  Abilify 20mg  daily to target mood instability.  Buspar 10mg  TID  Klonopin 1mg  4 times daily. Adderall 30mg  BID  Adderall 20mg  in the afternoon for work - evening shift  ADHD outside testing - tested 60 - adult ADHD in office today. Consider referral for psychological testing.  RTC 6 months - will call for refills in 3 months   Discussed potential metabolic side effects associated with atypical antipsychotics, as well as potential risk for movement  side effects. Advised pt to contact office if movement side effects occur.   Discussed potential benefits, risks, and side effects of stimulants with patient to include increased heart rate, palpitations, insomnia, increased anxiety, increased irritability, or decreased appetite.  Instructed patient to contact office if experiencing any significant tolerability issues.  Discussed potential benefits, risk, and side effects of benzodiazepines to include potential risk of tolerance and dependence, as well as possible drowsiness.  Advised patient not to drive if experiencing drowsiness and to take lowest possible effective dose to minimize risk of dependence and tolerance.  Patient advised to contact office with any questions, adverse effects, or acute worsening in signs and symptoms.   Diagnoses and all orders for this visit:  Insomnia, unspecified type  Bipolar I disorder ([place])  Attention deficit hyperactivity disorder (ADHD), unspecified ADHD type -     amphetamine-dextroamphetamine (ADDERALL) 30 MG tablet; Take 1 tablet by mouth daily. -     amphetamine-dextroamphetamine (ADDERALL) 30 MG tablet; Take 1 tablet by mouth 2 (two) times daily. -     amphetamine-dextroamphetamine (ADDERALL) 30 MG tablet; Take 1 tablet by mouth 2 (two) times daily. -     amphetamine-dextroamphetamine (ADDERALL) 20 MG tablet; Take 1 tablet (20 mg total) by mouth daily. -     amphetamine-dextroamphetamine (ADDERALL) 20 MG tablet; Take 1 tablet (20 mg total) by mouth daily. -     amphetamine-dextroamphetamine (ADDERALL) 20 MG tablet; Take 1 tablet (20 mg total) by mouth daily.  Major depressive disorder, recurrent episode, moderate (HCC) -     ARIPiprazole (ABILIFY) 20 MG tablet; Take 1 tablet (20 mg total) by mouth daily.  Generalized anxiety disorder -     ARIPiprazole (ABILIFY) 20 MG tablet; Take 1 tablet (20 mg total) by mouth daily. -     busPIRone (BUSPAR) 10 MG tablet; Take 1 tablet (10 mg total) by mouth 3  (three) times daily. -     clonazePAM (KLONOPIN) 1 MG tablet; Take 1 tablet (1 mg total) by mouth 4 (four) times daily as needed for anxiety.  Mood disorder (HCC) -     ARIPiprazole (ABILIFY)  20 MG tablet; Take 1 tablet (20 mg total) by mouth daily.     Please see After Visit Summary for patient specific instructions.  No future appointments.  No orders of the defined types were placed in this encounter.   -------------------------------

## 2020-11-21 ENCOUNTER — Encounter: Payer: Self-pay | Admitting: Emergency Medicine

## 2020-11-21 ENCOUNTER — Other Ambulatory Visit: Payer: Self-pay

## 2020-11-21 ENCOUNTER — Ambulatory Visit
Admission: EM | Admit: 2020-11-21 | Discharge: 2020-11-21 | Disposition: A | Discharge status: Home / Self Care | Payer: Managed Care, Other (non HMO) | Attending: Emergency Medicine | Admitting: Emergency Medicine

## 2020-11-21 DIAGNOSIS — R0981 Nasal congestion: Secondary | ICD-10-CM | POA: Diagnosis not present

## 2020-11-21 DIAGNOSIS — R059 Cough, unspecified: Secondary | ICD-10-CM

## 2020-11-21 MED ORDER — AMOXICILLIN-POT CLAVULANATE 875-125 MG PO TABS: 1.0000 | ORAL_TABLET | Freq: Two times a day (BID) | ORAL | 0 refills | Status: AC

## 2020-11-21 NOTE — ED Triage Notes (Addendum)
Sinus congestion and pressure. Has been out of work since Tuesday. Pt family states she thinks he has a sinus infection.

## 2020-11-21 NOTE — ED Provider Notes (Signed)
[facility]   [id number] 11/21/20 Arrival Time: 1012   CC: "sinus infection"  SUBJECTIVE: History from: patient and family.  Jay Montgomery is a 35 y.o. male who presents with maxillary sinus congestion/ pressure and cough x 4- 5 days.  Denies sick exposure to COVID, flu or strep.   Has tried OTC medications without relief.  Denies aggravating factors.  Denies previous symptoms in the past.   Denies fever, chills, sore throat, SOB, wheezing, chest pain, nausea, changes in bowel or bladder habits.    ROS: As per HPI.  All other pertinent ROS negative.     Past Medical History:  Diagnosis Date  . Anxiety   . Chicken pox   . Headache    due to CSF leak  . Heart murmur    "as a child"  . Spinal stenosis    Past Surgical History:  Procedure Laterality Date  . ADENOIDECTOMY    . BACK SURGERY  08/17/2017   L4-L5  . LUMBAR LAMINECTOMY/DECOMPRESSION MICRODISCECTOMY N/A 12/01/2017   Procedure: REPAIR OF CEREBROSPINAL FLUID LEAK and Placement of Lumbar Drain;  Surgeon: Earnie Larsson, MD;  Location: [facility];  Service: Neurosurgery;  Laterality: N/A;  . TONSILLECTOMY     Allergies  Allergen Reactions  . Hydroxyzine Other (See Comments)    Dizziness, mental fogginess, nausea  . Vicodin [Hydrocodone-Acetaminophen] Hives and Itching   No current facility-administered medications on file prior to encounter.   Current Outpatient Medications on File Prior to Encounter  Medication Sig Dispense Refill  . amphetamine-dextroamphetamine (ADDERALL) 20 MG tablet Take 1 tablet (20 mg total) by mouth daily. 30 tablet 0  . amphetamine-dextroamphetamine (ADDERALL) 20 MG tablet Take 1 tablet (20 mg total) by mouth daily. 30 tablet 0  . [START ON 12/19/2020] amphetamine-dextroamphetamine (ADDERALL) 20 MG tablet Take 1 tablet (20 mg total) by mouth daily. 30 tablet 0  . amphetamine-dextroamphetamine (ADDERALL) 30 MG tablet Take 1 tablet by mouth daily. 60 tablet 0  .  amphetamine-dextroamphetamine (ADDERALL) 30 MG tablet Take 1 tablet by mouth 2 (two) times daily. 60 tablet 0  . [START ON 12/19/2020] amphetamine-dextroamphetamine (ADDERALL) 30 MG tablet Take 1 tablet by mouth 2 (two) times daily. 60 tablet 0  . ARIPiprazole (ABILIFY) 20 MG tablet Take 1 tablet (20 mg total) by mouth daily. 30 tablet 2  . benzonatate (TESSALON) 100 MG capsule Take 1 capsule (100 mg total) by mouth 3 (three) times daily as needed. 30 capsule 0  . busPIRone (BUSPAR) 10 MG tablet Take 1 tablet (10 mg total) by mouth 3 (three) times daily. 90 tablet 2  . clonazePAM (KLONOPIN) 1 MG tablet Take 1 tablet (1 mg total) by mouth 4 (four) times daily as needed for anxiety. 120 tablet 2  . oxyCODONE-acetaminophen (PERCOCET/ROXICET) 5-325 MG tablet Take 1 tablet by mouth every 6 (six) hours as needed. 20 tablet 0   Social History   Socioeconomic History  . Marital status: Legally Separated    Spouse name: Not on file  . Number of children: Not on file  . Years of education: Not on file  . Highest education level: Not on file  Occupational History  . Occupation: [employer]  Tobacco Use  . Smoking status: Current Every Day Smoker    Packs/day: 1.00    Types: Cigarettes  . Smokeless tobacco: Never Used  Vaping Use  . Vaping Use: Former  Substance and Sexual Activity  . Alcohol use: Not Currently    Comment: occassional  . Drug  use: No  . Sexual activity: Yes    Partners: Female    Comment: wife  Other Topics Concern  . Not on file  Social History Narrative  . Not on file   Social Determinants of Health   Financial Resource Strain: Not on file  Food Insecurity: Not on file  Transportation Needs: Not on file  Physical Activity: Not on file  Stress: Not on file  Social Connections: Not on file  Intimate Partner Violence: Not on file   Family History  Problem Relation Age of Onset  . Arthritis Mother   . Hypertension Mother   . COPD Mother   . Emphysema Mother   .  Bipolar disorder Mother   . Arthritis Father   . Diabetes Father   . Bipolar disorder Sister   . Schizophrenia Sister     OBJECTIVE:  Vitals:   11/21/20 1027  BP: (!) 153/101  Pulse: (!) 104  Resp: 19  Temp: 97.7 F (36.5 C)  TempSrc: Oral  SpO2: 97%     General appearance: alert; appears fatigued, but nontoxic; speaking in full sentences and tolerating own secretions HEENT: NCAT; Ears: EACs clear, TMs pearly gray; Eyes: PERRL.  EOM grossly intact. Sinuses: TTP; Nose: nares patent without rhinorrhea, Throat: oropharynx clear, tonsils non erythematous or enlarged, uvula midline  Neck: supple without LAD Lungs: unlabored respirations, symmetrical air entry; cough: absent; no respiratory distress; CTAB Heart: regular rate and rhythm.   Skin: warm and dry Psychological: alert and cooperative; normal mood and affect  ASSESSMENT & PLAN:  1. Sinus congestion   2. Cough     Meds ordered this encounter  Medications  . amoxicillin-clavulanate (AUGMENTIN) 875-125 MG tablet    Sig: Take 1 tablet by mouth every 12 (twelve) hours for 10 days.    Dispense:  20 tablet    Refill:  0    Order Specific Question:   Supervising Provider    Answer:   Raylene Everts [[id number]]   Get plenty of rest and push fluids Augmentin prescribed.  Take as directed and to completion Use OTC zyrtec for nasal congestion, runny nose, and/or sore throat Use OTC flonase for nasal congestion and runny nose Use medications daily for symptom relief Use OTC medications like ibuprofen or tylenol as needed fever or pain Call or go to the ED if you have any new or worsening symptoms such as fever, cough, shortness of breath, chest tightness, chest pain, turning blue, changes in mental status, etc...   Reviewed expectations re: course of current medical issues. Questions answered. Outlined signs and symptoms indicating need for more acute intervention. Patient verbalized understanding. After Visit Summary  given.         Lestine Box, PA-C 11/21/20 1053

## 2020-11-21 NOTE — Discharge Instructions (Signed)
Get plenty of rest and push fluids Augmentin prescribed.  Take as directed and to completion Use OTC zyrtec for nasal congestion, runny nose, and/or sore throat Use OTC flonase for nasal congestion and runny nose Use medications daily for symptom relief Use OTC medications like ibuprofen or tylenol as needed fever or pain Call or go to the ED if you have any new or worsening symptoms such as fever, cough, shortness of breath, chest tightness, chest pain, turning blue, changes in mental status, etc..Marland Kitchen

## 2021-03-30 ENCOUNTER — Other Ambulatory Visit: Payer: Self-pay | Admitting: Adult Health

## 2021-03-30 ENCOUNTER — Telehealth: Payer: Self-pay | Admitting: Adult Health

## 2021-03-30 ENCOUNTER — Other Ambulatory Visit: Payer: Self-pay

## 2021-03-30 DIAGNOSIS — F909 Attention-deficit hyperactivity disorder, unspecified type: Secondary | ICD-10-CM

## 2021-03-30 MED ORDER — AMPHETAMINE-DEXTROAMPHETAMINE 20 MG PO TABS: 20.0000 mg | ORAL_TABLET | Freq: Every day | ORAL | 0 refills | Status: DC

## 2021-03-30 MED ORDER — AMPHETAMINE-DEXTROAMPHETAMINE 30 MG PO TABS: 30.0000 mg | ORAL_TABLET | Freq: Two times a day (BID) | ORAL | 0 refills | Status: DC

## 2021-03-30 NOTE — Telephone Encounter (Signed)
Pended.

## 2021-03-30 NOTE — Telephone Encounter (Signed)
Pt called and said that he needs a refill on his adderall 30 mg and 20 mg. [facility] [address]. Pt has an appt on 8/4

## 2021-03-31 ENCOUNTER — Other Ambulatory Visit: Payer: Self-pay | Admitting: Adult Health

## 2021-03-31 DIAGNOSIS — F411 Generalized anxiety disorder: Secondary | ICD-10-CM

## 2021-03-31 NOTE — Telephone Encounter (Signed)
Last filled 6/10 appt on 8/4

## 2021-04-23 ENCOUNTER — Ambulatory Visit (INDEPENDENT_AMBULATORY_CARE_PROVIDER_SITE_OTHER): Payer: 59 | Admitting: Adult Health

## 2021-04-23 ENCOUNTER — Encounter: Payer: Self-pay | Admitting: Adult Health

## 2021-04-23 ENCOUNTER — Other Ambulatory Visit: Payer: Self-pay

## 2021-04-23 DIAGNOSIS — F909 Attention-deficit hyperactivity disorder, unspecified type: Secondary | ICD-10-CM

## 2021-04-23 DIAGNOSIS — F331 Major depressive disorder, recurrent, moderate: Secondary | ICD-10-CM

## 2021-04-23 DIAGNOSIS — F39 Unspecified mood [affective] disorder: Secondary | ICD-10-CM

## 2021-04-23 DIAGNOSIS — F411 Generalized anxiety disorder: Secondary | ICD-10-CM

## 2021-04-23 MED ORDER — AMPHETAMINE-DEXTROAMPHETAMINE 20 MG PO TABS: 20.0000 mg | ORAL_TABLET | Freq: Every day | ORAL | 0 refills | Status: DC

## 2021-04-23 MED ORDER — ARIPIPRAZOLE 20 MG PO TABS: 20.0000 mg | ORAL_TABLET | Freq: Every day | ORAL | 2 refills | Status: DC

## 2021-04-23 MED ORDER — BUSPIRONE HCL 10 MG PO TABS: 10.0000 mg | ORAL_TABLET | Freq: Three times a day (TID) | ORAL | 2 refills | Status: DC

## 2021-04-23 MED ORDER — AMPHETAMINE-DEXTROAMPHETAMINE 30 MG PO TABS: 30.0000 mg | ORAL_TABLET | Freq: Every day | ORAL | 0 refills | Status: DC

## 2021-04-23 MED ORDER — CLONAZEPAM 1 MG PO TABS: ORAL_TABLET | ORAL | 2 refills | Status: DC

## 2021-04-23 MED ORDER — AMPHETAMINE-DEXTROAMPHETAMINE 30 MG PO TABS: 30.0000 mg | ORAL_TABLET | Freq: Two times a day (BID) | ORAL | 0 refills | Status: DC

## 2021-04-23 NOTE — Progress Notes (Signed)
ASAI VANTREASE EI:[id number] Jan 01, 1986 35 y.o.  Subjective:   Patient ID:  Jay Montgomery is a 35 y.o. (DOB 1985/12/05) male.  Chief Complaint: No chief complaint on file.   HPI Marcelino Pellecchia Barbato presents to the office today for follow-up of GAD, MDD, insomnia, ADHD, and BPD 1.  Describes mood today as "ok". Pleasant. Mood symptoms - reports decreased depression, anxiety - "up a little bit", and irritability. Recently took in members of his family - "they were not in a good place". Stating "I've been doing pretty good".  Is happy with new job - "it's a good fit for me". Feels like current medications are working well for him. Stable interest and motivation. Taking medications as prescribed. Energy levels stable "for the most part". Active, does not have a regular exercise routine. Enjoys some usual interests. Single. Lives with family - fiance and 3 children. Family local and supportive.  Appetite increased. Weight gain - 221 pounds. Sleeps well most nights. Averages 5 to 6 hours during the week and more on the weekends.  Focus and concentration stable. Completing tasks. Manages aspects of household. Work going well.  Denies SI or HI.  Denies AH or VH.  Medication trials: Xanax, Celexa, Valium   GAD-7    Flowsheet Row Office Visit from 02/15/2019 in [place] Office Visit from 01/31/2019 in [place] Office Visit from 01/25/2019 in [facility]  Total GAD-7 Score '3 11 18      '$ PHQ2-9    [place] Video Visit from 04/29/2020 in [place] Office Visit from 01/25/2019 in [facility]  PHQ-2 Total Score 0 0  PHQ-9 Total Score 1 --      [facility] from 11/21/2020 in [facility] [place] No Risk        Review of Systems:  Review  of Systems  Musculoskeletal:  Negative for gait problem.  Neurological:  Negative for tremors.  Psychiatric/Behavioral:         Please refer to HPI   Medications: I have reviewed the patient's current medications.  Current Outpatient Medications  Medication Sig Dispense Refill   amphetamine-dextroamphetamine (ADDERALL) 20 MG tablet Take 1 tablet (20 mg total) by mouth daily. 30 tablet 0   [START ON 05/21/2021] amphetamine-dextroamphetamine (ADDERALL) 20 MG tablet Take 1 tablet (20 mg total) by mouth daily. 30 tablet 0   [START ON 06/18/2021] amphetamine-dextroamphetamine (ADDERALL) 20 MG tablet Take 1 tablet (20 mg total) by mouth daily. 30 tablet 0   amphetamine-dextroamphetamine (ADDERALL) 30 MG tablet Take 1 tablet by mouth daily. 60 tablet 0   [START ON 05/21/2021] amphetamine-dextroamphetamine (ADDERALL) 30 MG tablet Take 1 tablet by mouth 2 (two) times daily. 60 tablet 0   [START ON 06/18/2021] amphetamine-dextroamphetamine (ADDERALL) 30 MG tablet Take 1 tablet by mouth 2 (two) times daily. 60 tablet 0   ARIPiprazole (ABILIFY) 20 MG tablet Take 1 tablet (20 mg total) by mouth daily. 30 tablet 2   benzonatate (TESSALON) 100 MG capsule Take 1 capsule (100 mg total) by mouth 3 (three) times daily as needed. 30 capsule 0   busPIRone (BUSPAR) 10 MG tablet Take 1 tablet (10 mg total) by mouth 3 (three) times daily. 90 tablet 2   clonazePAM (KLONOPIN) 1 MG tablet TAKE ONE TABLET BY MOUTH FOUR TIMES A DAY AS NEEDED FOR ANXIETY 120 tablet 2   oxyCODONE-acetaminophen (PERCOCET/ROXICET) 5-325  MG tablet Take 1 tablet by mouth every 6 (six) hours as needed. 20 tablet 0   No current facility-administered medications for this visit.    Medication Side Effects: None  Allergies:  Allergies  Allergen Reactions   Hydroxyzine Other (See Comments)    Dizziness, mental fogginess, nausea   Vicodin [Hydrocodone-Acetaminophen] Hives and Itching    Past Medical History:  Diagnosis Date   Anxiety     Chicken pox    Headache    due to CSF leak   Heart murmur    "as a child"   Spinal stenosis     Past Medical History, Surgical history, Social history, and Family history were reviewed and updated as appropriate.   Please see review of systems for further details on the patient's review from today.   Objective:   Physical Exam:  There were no vitals taken for this visit.  Physical Exam Constitutional:      General: He is not in acute distress. Musculoskeletal:        General: No deformity.  Neurological:     Mental Status: He is alert and oriented to person, place, and time.     Coordination: Coordination normal.  Psychiatric:        Attention and Perception: Attention and perception normal. He does not perceive auditory or visual hallucinations.        Mood and Affect: Mood normal. Mood is not anxious or depressed. Affect is not labile, blunt, angry or inappropriate.        Speech: Speech normal.        Behavior: Behavior normal.        Thought Content: Thought content normal. Thought content is not paranoid or delusional. Thought content does not include homicidal or suicidal ideation. Thought content does not include homicidal or suicidal plan.        Cognition and Memory: Cognition and memory normal.        Judgment: Judgment normal.     Comments: Insight intact    Lab Review:     Component Value Date/Time   NA 136 06/23/2020 0620   K 3.9 06/23/2020 0620   CL 99 06/23/2020 0620   CO2 26 06/23/2020 0620   GLUCOSE 96 06/23/2020 0620   BUN 11 06/23/2020 0620   CREATININE 0.97 06/23/2020 0620   CALCIUM 9.2 06/23/2020 0620   GFRNONAA >60 06/23/2020 0620   GFRAA >60 06/23/2020 0620       Component Value Date/Time   WBC 11.3 (H) 06/23/2020 0620   RBC 5.16 06/23/2020 0620   HGB 15.8 06/23/2020 0620   HCT 47.1 06/23/2020 0620   PLT 254 06/23/2020 0620   MCV 91.3 06/23/2020 0620   MCH 30.6 06/23/2020 0620   MCHC 33.5 06/23/2020 0620   RDW 13.1 06/23/2020 0620    LYMPHSABS 3.0 06/23/2020 0620   MONOABS 0.7 06/23/2020 0620   EOSABS 0.4 06/23/2020 0620   BASOSABS 0.1 06/23/2020 0620    No results found for: POCLITH, LITHIUM   No results found for: PHENYTOIN, PHENOBARB, VALPROATE, CBMZ   .res Assessment: Plan:     Plan:  Abilify '20mg'$  daily to target mood instability.  Buspar '10mg'$  TID  Klonopin '1mg'$  4 times daily. Adderall '30mg'$  BID  Adderall '20mg'$  in the afternoon for work - evening shift  ADHD outside testing - tested 77 - adult ADHD in office today. Consider referral for psychological testing.  RTC 6 months - will call for refills in 3 months   Discussed potential  metabolic side effects associated with atypical antipsychotics, as well as potential risk for movement side effects. Advised pt to contact office if movement side effects occur.   Discussed potential benefits, risks, and side effects of stimulants with patient to include increased heart rate, palpitations, insomnia, increased anxiety, increased irritability, or decreased appetite.  Instructed patient to contact office if experiencing any significant tolerability issues.  Discussed potential benefits, risk, and side effects of benzodiazepines to include potential risk of tolerance and dependence, as well as possible drowsiness.  Advised patient not to drive if experiencing drowsiness and to take lowest possible effective dose to minimize risk of dependence and tolerance.   Diagnoses and all orders for this visit:  Attention deficit hyperactivity disorder (ADHD), unspecified ADHD type -     amphetamine-dextroamphetamine (ADDERALL) 30 MG tablet; Take 1 tablet by mouth daily. -     amphetamine-dextroamphetamine (ADDERALL) 30 MG tablet; Take 1 tablet by mouth 2 (two) times daily. -     amphetamine-dextroamphetamine (ADDERALL) 30 MG tablet; Take 1 tablet by mouth 2 (two) times daily. -     amphetamine-dextroamphetamine (ADDERALL) 20 MG tablet; Take 1 tablet (20 mg total) by mouth  daily. -     amphetamine-dextroamphetamine (ADDERALL) 20 MG tablet; Take 1 tablet (20 mg total) by mouth daily. -     amphetamine-dextroamphetamine (ADDERALL) 20 MG tablet; Take 1 tablet (20 mg total) by mouth daily.  Major depressive disorder, recurrent episode, moderate (HCC) -     ARIPiprazole (ABILIFY) 20 MG tablet; Take 1 tablet (20 mg total) by mouth daily.  Generalized anxiety disorder -     ARIPiprazole (ABILIFY) 20 MG tablet; Take 1 tablet (20 mg total) by mouth daily. -     busPIRone (BUSPAR) 10 MG tablet; Take 1 tablet (10 mg total) by mouth 3 (three) times daily. -     clonazePAM (KLONOPIN) 1 MG tablet; TAKE ONE TABLET BY MOUTH FOUR TIMES A DAY AS NEEDED FOR ANXIETY  Mood disorder (HCC) -     ARIPiprazole (ABILIFY) 20 MG tablet; Take 1 tablet (20 mg total) by mouth daily.    Please see After Visit Summary for patient specific instructions.  No future appointments.   No orders of the defined types were placed in this encounter.   -------------------------------

## 2021-06-09 ENCOUNTER — Telehealth: Payer: BC Managed Care – PPO | Admitting: Physician Assistant

## 2021-06-09 ENCOUNTER — Telehealth: Payer: Self-pay | Admitting: *Deleted

## 2021-06-09 DIAGNOSIS — Z20822 Contact with and (suspected) exposure to covid-19: Secondary | ICD-10-CM | POA: Diagnosis not present

## 2021-06-09 MED ORDER — BENZONATATE 100 MG PO CAPS: 100.0000 mg | ORAL_CAPSULE | Freq: Three times a day (TID) | ORAL | 0 refills | Status: DC | PRN

## 2021-06-09 MED ORDER — ALBUTEROL SULFATE HFA 108 (90 BASE) MCG/ACT IN AERS: 2.0000 | INHALATION_SPRAY | Freq: Four times a day (QID) | RESPIRATORY_TRACT | 0 refills | Status: DC | PRN

## 2021-06-09 NOTE — Progress Notes (Signed)
Virtual Visit Consent   Jay Montgomery, you are scheduled for a virtual visit with a [facility] [place] provider today.     Just as with appointments in the office, your consent must be obtained to participate.  Your consent will be active for this visit and any virtual visit you may have with one of our providers in the next 365 days.     If you have a MyChart account, a copy of this consent can be sent to you electronically.  All virtual visits are billed to your insurance company just like a traditional visit in the office.    As this is a virtual visit, video technology does not allow for your provider to perform a traditional examination.  This may limit your provider's ability to fully assess your condition.  If your provider identifies any concerns that need to be evaluated in person or the need to arrange testing (such as labs, EKG, etc.), we will make arrangements to do so.     Although advances in technology are sophisticated, we cannot ensure that it will always work on either your end or our end.  If the connection with a video visit is poor, the visit may have to be switched to a telephone visit.  With either a video or telephone visit, we are not always able to ensure that we have a secure connection.     I need to obtain your verbal consent now.   Are you willing to proceed with your visit today?    Jay Montgomery has provided verbal consent on 06/09/2021 for a virtual visit (video or telephone).   Jay Montgomery, [place]   Date: 06/09/2021 5:00 PM   Virtual Visit via Video Note   I, Jay Montgomery, connected with  Jay Montgomery  ([id number], [birth year]) on 06/09/21 at  4:30 PM EDT by a video-enabled telemedicine application and verified that I am speaking with the correct person using two identifiers.  Location: Patient: Virtual Visit Location Patient: Home Provider: Virtual Visit Location Provider: Home Office   I discussed the limitations of evaluation and  management by telemedicine and the availability of in person appointments. The patient expressed understanding and agreed to proceed.    History of Present Illness: Jay Montgomery is a 35 y.o. who identifies as a male who was assigned male at birth, and is being seen today for possible COVID after known exposure. Patient endorses son starting with symptoms last week, testing positive last Thursday. He has started developing symptoms in the past couple of days with sore throat, headache, dry cough, fatigue, head congestion. Denies chest pain or SOB. Took a home COVID test which was negative. He is not sure if he needs to be retested or if symptoms are caused from something else.   HPI: HPI  Problems:  Patient Active Problem List   Diagnosis Date Noted   Bradycardia 05/18/2019   Syncope and collapse 05/18/2019   GAD (generalized anxiety disorder) 01/25/2019   Postoperative CSF leak 12/01/2017   Spinal stenosis of lumbar region 02/15/2017    Allergies:  Allergies  Allergen Reactions   Hydroxyzine Other (See Comments)    Dizziness, mental fogginess, nausea   Vicodin [Hydrocodone-Acetaminophen] Hives and Itching   Medications:  Current Outpatient Medications:    albuterol (VENTOLIN HFA) 108 (90 Base) MCG/ACT inhaler, Inhale 2 puffs into the lungs every 6 (six) hours as needed for wheezing or shortness of breath., Disp: 8 g, Rfl: 0   benzonatate (  TESSALON) 100 MG capsule, Take 1 capsule (100 mg total) by mouth 3 (three) times daily as needed for cough., Disp: 30 capsule, Rfl: 0   amphetamine-dextroamphetamine (ADDERALL) 20 MG tablet, Take 1 tablet (20 mg total) by mouth daily., Disp: 30 tablet, Rfl: 0   amphetamine-dextroamphetamine (ADDERALL) 20 MG tablet, Take 1 tablet (20 mg total) by mouth daily., Disp: 30 tablet, Rfl: 0   [START ON 06/18/2021] amphetamine-dextroamphetamine (ADDERALL) 20 MG tablet, Take 1 tablet (20 mg total) by mouth daily., Disp: 30 tablet, Rfl: 0    amphetamine-dextroamphetamine (ADDERALL) 30 MG tablet, Take 1 tablet by mouth daily., Disp: 60 tablet, Rfl: 0   amphetamine-dextroamphetamine (ADDERALL) 30 MG tablet, Take 1 tablet by mouth 2 (two) times daily., Disp: 60 tablet, Rfl: 0   [START ON 06/18/2021] amphetamine-dextroamphetamine (ADDERALL) 30 MG tablet, Take 1 tablet by mouth 2 (two) times daily., Disp: 60 tablet, Rfl: 0   ARIPiprazole (ABILIFY) 20 MG tablet, Take 1 tablet (20 mg total) by mouth daily., Disp: 30 tablet, Rfl: 2   busPIRone (BUSPAR) 10 MG tablet, Take 1 tablet (10 mg total) by mouth 3 (three) times daily., Disp: 90 tablet, Rfl: 2   clonazePAM (KLONOPIN) 1 MG tablet, TAKE ONE TABLET BY MOUTH FOUR TIMES A DAY AS NEEDED FOR ANXIETY, Disp: 120 tablet, Rfl: 2   oxyCODONE-acetaminophen (PERCOCET/ROXICET) 5-325 MG tablet, Take 1 tablet by mouth every 6 (six) hours as needed., Disp: 20 tablet, Rfl: 0  Observations/Objective: Patient is well-developed, well-nourished in no acute distress.  Resting comfortably at home.  Head is normocephalic, atraumatic.  No labored breathing. Speech is clear and coherent with logical content.  Patient is alert and oriented at baseline.   Assessment and Plan: 1. Suspected COVID-19 virus infection - MyChart COVID-19 home monitoring program; Future - benzonatate (TESSALON) 100 MG capsule; Take 1 capsule (100 mg total) by mouth 3 (three) times daily as needed for cough.  Dispense: 30 capsule; Refill: 0 - albuterol (VENTOLIN HFA) 108 (90 Base) MCG/ACT inhaler; Inhale 2 puffs into the lungs every 6 (six) hours as needed for wheezing or shortness of breath.  Dispense: 8 g; Refill: 0 Positive exposure with classic symptoms. Will send him for PCR test. He is to quarantine until results are in. Supportive measures, OTC medications and Vitamin regimen reviewed. Rx Tessalon and Albuterol. Patient enrolled in a symptom monitoring program through MyChart.   Follow Up Instructions: I discussed the assessment  and treatment plan with the patient. The patient was provided an opportunity to ask questions and all were answered. The patient agreed with the plan and demonstrated an understanding of the instructions.  A copy of instructions were sent to the patient via MyChart.  The patient was advised to call back or seek an in-person evaluation if the symptoms worsen or if the condition fails to improve as anticipated.  Time:  I spent 15 minutes with the patient via telehealth technology discussing the above problems/concerns.    Jay Rio, PA-C

## 2021-06-09 NOTE — Patient Instructions (Signed)
Jay Montgomery, thank you for joining [name], PA-C for today's virtual visit.  While this provider is not your primary care provider (PCP), if your PCP is located in our provider database this encounter information will be shared with them immediately following your visit.  Consent: (Patient) Jay Montgomery provided verbal consent for this virtual visit at the beginning of the encounter.  Current Medications:  Current Outpatient Medications:    amphetamine-dextroamphetamine (ADDERALL) 20 MG tablet, Take 1 tablet (20 mg total) by mouth daily., Disp: 30 tablet, Rfl: 0   amphetamine-dextroamphetamine (ADDERALL) 20 MG tablet, Take 1 tablet (20 mg total) by mouth daily., Disp: 30 tablet, Rfl: 0   [START ON 06/18/2021] amphetamine-dextroamphetamine (ADDERALL) 20 MG tablet, Take 1 tablet (20 mg total) by mouth daily., Disp: 30 tablet, Rfl: 0   amphetamine-dextroamphetamine (ADDERALL) 30 MG tablet, Take 1 tablet by mouth daily., Disp: 60 tablet, Rfl: 0   amphetamine-dextroamphetamine (ADDERALL) 30 MG tablet, Take 1 tablet by mouth 2 (two) times daily., Disp: 60 tablet, Rfl: 0   [START ON 06/18/2021] amphetamine-dextroamphetamine (ADDERALL) 30 MG tablet, Take 1 tablet by mouth 2 (two) times daily., Disp: 60 tablet, Rfl: 0   ARIPiprazole (ABILIFY) 20 MG tablet, Take 1 tablet (20 mg total) by mouth daily., Disp: 30 tablet, Rfl: 2   benzonatate (TESSALON) 100 MG capsule, Take 1 capsule (100 mg total) by mouth 3 (three) times daily as needed., Disp: 30 capsule, Rfl: 0   busPIRone (BUSPAR) 10 MG tablet, Take 1 tablet (10 mg total) by mouth 3 (three) times daily., Disp: 90 tablet, Rfl: 2   clonazePAM (KLONOPIN) 1 MG tablet, TAKE ONE TABLET BY MOUTH FOUR TIMES A DAY AS NEEDED FOR ANXIETY, Disp: 120 tablet, Rfl: 2   oxyCODONE-acetaminophen (PERCOCET/ROXICET) 5-325 MG tablet, Take 1 tablet by mouth every 6 (six) hours as needed., Disp: 20 tablet, Rfl: 0   Medications ordered in this encounter:   No orders of the defined types were placed in this encounter.    *If you need refills on other medications prior to your next appointment, please contact your pharmacy*  Follow-Up: Call back or seek an in-person evaluation if the symptoms worsen or if the condition fails to improve as anticipated.  Other Instructions Please keep well-hydrated and get plenty of rest. Start a saline nasal rinse to flush out your nasal passages. You can use plain Mucinex to help thin congestion. If you have a humidifier, running in the bedroom at night. I want you to start OTC vitamin D3 1000 units daily, vitamin C 1000 mg daily, and a zinc supplement. Please take prescribed medications as directed.  You have been enrolled in a MyChart symptom monitoring program. Please answer these questions daily so we can keep track of how you are doing.  As discussed, I want you to get retested for COVID via a PCR test. You can have this done at local [facility] or through Department of Health Test site finder at [email or web address]  If you note any worsening of symptoms, any significant shortness of breath or any chest pain, please seek ER evaluation ASAP.  Please do not delay care!  COVID-19: What to Do if You Are Sick If you test positive and are an older adult or someone who is at high risk of getting very sick from COVID-19, treatment may be available. Contact a healthcare provider right away after a positive test to determine if you are eligible, even if your symptoms are mild right now. You can also  visit a Test to Treat location and, if eligible, receive a prescription from a provider. Don't delay: Treatment must be started within the first few days to be effective. If you have a fever, cough, or other symptoms, you might have COVID-19. Most people have mild illness and are able to recover at home. If you are sick: Keep track of your symptoms. If you have an emergency warning sign (including  trouble breathing), call 911. Steps to help prevent the spread of COVID-19 if you are sick If you are sick with COVID-19 or think you might have COVID-19, follow the steps below to care for yourself and to help protect other people in your home and community. Stay home except to get medical care Stay home. Most people with COVID-19 have mild illness and can recover at home without medical care. Do not leave your home, except to get medical care. Do not visit public areas and do not go to places where you are unable to wear a mask. Take care of yourself. Get rest and stay hydrated. Take over-the-counter medicines, such as acetaminophen, to help you feel better. Stay in touch with your doctor. Call before you get medical care. Be sure to get care if you have trouble breathing, or have any other emergency warning signs, or if you think it is an emergency. Avoid public transportation, ride-sharing, or taxis if possible. Get tested If you have symptoms of COVID-19, get tested. While waiting for test results, stay away from others, including staying apart from those living in your household. Get tested as soon as possible after your symptoms start. Treatments may be available for people with COVID-19 who are at risk for becoming very sick. Don't delay: Treatment must be started early to be effective--some treatments must begin within 5 days of your first symptoms. Contact your healthcare provider right away if your test result is positive to determine if you are eligible. Self-tests are one of several options for testing for the virus that causes COVID-19 and may be more convenient than laboratory-based tests and point-of-care tests. Ask your healthcare provider or your local health department if you need help interpreting your test results. You can visit your state, tribal, local, and territorial health department's website to look for the latest local information on testing sites. Separate yourself from  other people As much as possible, stay in a specific room and away from other people and pets in your home. If possible, you should use a separate bathroom. If you need to be around other people or animals in or outside of the home, wear a well-fitting mask. Tell your close contacts that they may have been exposed to COVID-19. An infected person can spread COVID-19 starting 48 hours (or 2 days) before the person has any symptoms or tests positive. By letting your close contacts know they may have been exposed to COVID-19, you are helping to protect everyone. See COVID-19 and Animals if you have questions about pets. If you are diagnosed with COVID-19, someone from the health department may call you. Answer the call to slow the spread. Monitor your symptoms Symptoms of COVID-19 include fever, cough, or other symptoms. Follow care instructions from your healthcare provider and local health department. Your local health authorities may give instructions on checking your symptoms and reporting information. When to seek emergency medical attention Look for emergency warning signs* for COVID-19. If someone is showing any of these signs, seek emergency medical care immediately: Trouble breathing Persistent pain or pressure in the  chest New confusion Inability to wake or stay awake Pale, gray, or blue-colored skin, lips, or nail beds, depending on skin tone *This list is not all possible symptoms. Please call your medical provider for any other symptoms that are severe or concerning to you. Call 911 or call ahead to your local emergency facility: Notify the operator that you are seeking care for someone who has or may have COVID-19. Call ahead before visiting your doctor Call ahead. Many medical visits for routine care are being postponed or done by phone or telemedicine. If you have a medical appointment that cannot be postponed, call your doctor's office, and tell them you have or may have COVID-19.  This will help the office protect themselves and other patients. If you are sick, wear a well-fitting mask You should wear a mask if you must be around other people or animals, including pets (even at home). Wear a mask with the best fit, protection, and comfort for you. You don't need to wear the mask if you are alone. If you can't put on a mask (because of trouble breathing, for example), cover your coughs and sneezes in some other way. Try to stay at least 6 feet away from other people. This will help protect the people around you. Masks should not be placed on young children under age 78 years, anyone who has trouble breathing, or anyone who is not able to remove the mask without help. Cover your coughs and sneezes Cover your mouth and nose with a tissue when you cough or sneeze. Throw away used tissues in a lined trash can. Immediately wash your hands with soap and water for at least 20 seconds. If soap and water are not available, clean your hands with an alcohol-based hand sanitizer that contains at least 60% alcohol. Clean your hands often Wash your hands often with soap and water for at least 20 seconds. This is especially important after blowing your nose, coughing, or sneezing; going to the bathroom; and before eating or preparing food. Use hand sanitizer if soap and water are not available. Use an alcohol-based hand sanitizer with at least 60% alcohol, covering all surfaces of your hands and rubbing them together until they feel dry. Soap and water are the best option, especially if hands are visibly dirty. Avoid touching your eyes, nose, and mouth with unwashed hands. Handwashing Tips Avoid sharing personal household items Do not share dishes, drinking glasses, cups, eating utensils, towels, or bedding with other people in your home. Wash these items thoroughly after using them with soap and water or put in the dishwasher. Clean surfaces in your home regularly Clean and disinfect  high-touch surfaces (for example, doorknobs, tables, handles, light switches, and countertops) in your "sick room" and bathroom. In shared spaces, you should clean and disinfect surfaces and items after each use by the person who is ill. If you are sick and cannot clean, a caregiver or other person should only clean and disinfect the area around you (such as your bedroom and bathroom) on an as needed basis. Your caregiver/other person should wait as long as possible (at least several hours) and wear a mask before entering, cleaning, and disinfecting shared spaces that you use. Clean and disinfect areas that may have blood, stool, or body fluids on them. Use household cleaners and disinfectants. Clean visible dirty surfaces with household cleaners containing soap or detergent. Then, use a household disinfectant. Use a product from [employer] List N: Disinfectants for Coronavirus (WCHEN-27). Be sure to  follow the instructions on the label to ensure safe and effective use of the product. Many products recommend keeping the surface wet with a disinfectant for a certain period of time (look at "contact time" on the product label). You may also need to wear personal protective equipment, such as gloves, depending on the directions on the product label. Immediately after disinfecting, wash your hands with soap and water for 20 seconds. For completed guidance on cleaning and disinfecting your home, visit Complete Disinfection Guidance. Take steps to improve ventilation at home Improve ventilation (air flow) at home to help prevent from spreading COVID-19 to other people in your household. Clear out COVID-19 virus particles in the air by opening windows, using air filters, and turning on fans in your home. Use this interactive tool to learn how to improve air flow in your home. When you can be around others after being sick with COVID-19 Deciding when you can be around others is different for different situations.  Find out when you can safely end home isolation. For any additional questions about your care, contact your healthcare provider or state or local health department. 12/09/2020 Content source: [facility] ([facility]), Division of Viral Diseases This information is not intended to replace advice given to you by your health care provider. Make sure you discuss any questions you have with your health care provider. Document Revised: 01/22/2021 Document Reviewed: 01/22/2021 Elsevier Patient Education  2022 [employer].      If you have been instructed to have an in-person evaluation today at a local Urgent Care facility, please use the link below. It will take you to a list of all of our available [facility], including address, phone number and hours of operation. Please do not delay care.  [facility]  If you or a family member do not have a primary care provider, use the link below to schedule a visit and establish care. When you choose a [facility] primary care physician or advanced practice provider, you gain a long-term partner in health. Find a Primary Care Provider  Learn more about [place]'s in-office and virtual care options: [facility]

## 2021-06-09 NOTE — Telephone Encounter (Signed)
Called patient to review symptoms of worsening cough, vomiting, worsening appetite and diarrhea. No answer, no voicemail set up to leave message. Will send message via My Chart regarding symptoms and treatments.

## 2021-06-10 ENCOUNTER — Telehealth: Payer: Self-pay

## 2021-06-10 NOTE — Telephone Encounter (Addendum)
BPA triggered for worsening symptoms SOB. Patient called, no answer, voicemail not set up yet per recording. Will send information in MyChart.

## 2021-06-12 ENCOUNTER — Telehealth: Payer: Self-pay | Admitting: Adult Health

## 2021-06-12 NOTE — Telephone Encounter (Signed)
Epic shows we sent rx for both strengths of adderall.He needs to inform pharmacy he should have rx on file attempted to reach him with no answer

## 2021-06-12 NOTE — Telephone Encounter (Signed)
Patient called in for refill for Adderall 30mg . States that pharmacy had all other prescriptions except the Adderrall. Ph: [facility] [place], [place]

## 2021-07-21 ENCOUNTER — Encounter (HOSPITAL_COMMUNITY): Payer: Self-pay | Admitting: Emergency Medicine

## 2021-07-21 ENCOUNTER — Other Ambulatory Visit: Payer: Self-pay

## 2021-07-21 DIAGNOSIS — S93401A Sprain of unspecified ligament of right ankle, initial encounter: Secondary | ICD-10-CM | POA: Insufficient documentation

## 2021-07-21 DIAGNOSIS — Y92007 Garden or yard of unspecified non-institutional (private) residence as the place of occurrence of the external cause: Secondary | ICD-10-CM | POA: Insufficient documentation

## 2021-07-21 DIAGNOSIS — R000 Tachycardia, unspecified: Secondary | ICD-10-CM | POA: Insufficient documentation

## 2021-07-21 DIAGNOSIS — F17210 Nicotine dependence, cigarettes, uncomplicated: Secondary | ICD-10-CM | POA: Insufficient documentation

## 2021-07-21 DIAGNOSIS — Y9301 Activity, walking, marching and hiking: Secondary | ICD-10-CM | POA: Insufficient documentation

## 2021-07-21 DIAGNOSIS — W010XXA Fall on same level from slipping, tripping and stumbling without subsequent striking against object, initial encounter: Secondary | ICD-10-CM | POA: Insufficient documentation

## 2021-07-21 NOTE — ED Triage Notes (Signed)
Pt c/o right ankle pain after falling stepping in hole 1 hour ago.

## 2021-07-22 ENCOUNTER — Emergency Department (HOSPITAL_COMMUNITY): Payer: Self-pay

## 2021-07-22 ENCOUNTER — Emergency Department (HOSPITAL_COMMUNITY)
Admission: EM | Admit: 2021-07-22 | Discharge: 2021-07-22 | Disposition: A | Discharge status: Home / Self Care | Payer: Self-pay | Attending: Emergency Medicine | Admitting: Emergency Medicine

## 2021-07-22 DIAGNOSIS — S93401A Sprain of unspecified ligament of right ankle, initial encounter: Secondary | ICD-10-CM

## 2021-07-22 MED ORDER — OXYCODONE-ACETAMINOPHEN 5-325 MG PO TABS
1.0000 | ORAL_TABLET | Freq: Once | ORAL | Status: AC
  Administered 2021-07-22: 1 via ORAL
  Filled 2021-07-22: qty 1

## 2021-07-22 MED ORDER — NAPROXEN 500 MG PO TABS: 500.0000 mg | ORAL_TABLET | Freq: Two times a day (BID) | ORAL | 0 refills | Status: DC

## 2021-07-22 NOTE — ED Provider Notes (Signed)
[facility] EMERGENCY DEPARTMENT Provider Note   CSN: [id number] Arrival date & time: 07/21/21  2304     History Chief Complaint  Patient presents with   Ankle Pain    Jay Montgomery is a 35 y.o. male.  HPI    This is a 35 year old male who presents with right ankle injury.  Patient reports that he was walking in his yard when he stepped into a hole turning his right ankle.  He heard multiple pops.  He rates his pain a 10 out of 10.  He has had difficulty ambulating since.  Pain is worse with ambulation.  Denies knee pain.  Denies hitting his head or loss of consciousness.   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   Syncope and collapse 05/18/2019   GAD (generalized anxiety disorder) 01/25/2019   Postoperative CSF leak 12/01/2017   Spinal stenosis of lumbar region 02/15/2017    Past Surgical History:  Procedure Laterality Date   ADENOIDECTOMY     BACK SURGERY  08/17/2017   L4-L5   LUMBAR LAMINECTOMY/DECOMPRESSION MICRODISCECTOMY N/A 12/01/2017   Procedure: REPAIR OF CEREBROSPINAL FLUID LEAK and Placement of Lumbar Drain;  Surgeon: Earnie Larsson, MD;  Location: [facility];  Service: Neurosurgery;  Laterality: N/A;   TONSILLECTOMY         Family History  Problem Relation Age of Onset   Arthritis Mother    Hypertension Mother    COPD Mother    Emphysema Mother    Bipolar disorder Mother    Arthritis Father    Diabetes Father    Bipolar disorder Sister    Schizophrenia Sister     Social History   Tobacco Use   Smoking status: Every Day    Packs/day: 1.00    Types: Cigarettes   Smokeless tobacco: Never  Vaping Use   Vaping Use: Former  Substance Use Topics   Alcohol use: Not Currently    Comment: occassional   Drug use: No    Home Medications Prior to Admission  medications   Medication Sig Start Date End Date Taking? Authorizing Provider  naproxen (NAPROSYN) 500 MG tablet Take 1 tablet (500 mg total) by mouth 2 (two) times daily. 07/22/21  Yes Rasheeda Mulvehill, Barbette Hair, MD  albuterol (VENTOLIN HFA) 108 (90 Base) MCG/ACT inhaler Inhale 2 puffs into the lungs every 6 (six) hours as needed for wheezing or shortness of breath. 06/09/21   Brunetta Jeans, PA-C  amphetamine-dextroamphetamine (ADDERALL) 20 MG tablet Take 1 tablet (20 mg total) by mouth daily. 04/23/21   Mozingo, Berdie Ogren, NP  amphetamine-dextroamphetamine (ADDERALL) 20 MG tablet Take 1 tablet (20 mg total) by mouth daily. 05/21/21   Mozingo, Berdie Ogren, NP  amphetamine-dextroamphetamine (ADDERALL) 20 MG tablet Take 1 tablet (20 mg total) by mouth daily. 06/18/21   Mozingo, Berdie Ogren, NP  amphetamine-dextroamphetamine (ADDERALL) 30 MG tablet Take 1 tablet by mouth daily. 04/23/21   Mozingo, Berdie Ogren, NP  amphetamine-dextroamphetamine (ADDERALL) 30 MG tablet Take 1 tablet by mouth 2 (two) times daily. 05/21/21   Mozingo, Berdie Ogren, NP  amphetamine-dextroamphetamine (ADDERALL) 30 MG tablet Take 1 tablet by mouth 2 (two) times daily. 06/18/21   Mozingo, Berdie Ogren, NP  ARIPiprazole (ABILIFY)  20 MG tablet Take 1 tablet (20 mg total) by mouth daily. 04/23/21   Mozingo, Berdie Ogren, NP  benzonatate (TESSALON) 100 MG capsule Take 1 capsule (100 mg total) by mouth 3 (three) times daily as needed for cough. 06/09/21   Brunetta Jeans, PA-C  busPIRone (BUSPAR) 10 MG tablet Take 1 tablet (10 mg total) by mouth 3 (three) times daily. 04/23/21   Mozingo, Berdie Ogren, NP  clonazePAM (KLONOPIN) 1 MG tablet TAKE ONE TABLET BY MOUTH FOUR TIMES A DAY AS NEEDED FOR ANXIETY 04/23/21   Mozingo, Berdie Ogren, NP  oxyCODONE-acetaminophen (PERCOCET/ROXICET) 5-325 MG tablet Take 1 tablet by mouth every 6 (six) hours as needed. 06/23/20   Milton Ferguson, MD    Allergies    Hydroxyzine and Vicodin  [hydrocodone-acetaminophen]  Review of Systems   Review of Systems  Musculoskeletal:        Right ankle pain  Neurological:  Negative for weakness and numbness.  All other systems reviewed and are negative.  Physical Exam Updated Vital Signs BP (!) 149/104   Pulse (!) 118   Temp 98.2 F (36.8 C)   Resp 20   Ht 1.905 m (6\' 3" )   Wt 129.3 kg   SpO2 100%   BMI 35.62 kg/m   Physical Exam Vitals and nursing note reviewed.  Constitutional:      Appearance: He is well-developed. He is not ill-appearing.  HENT:     Head: Normocephalic and atraumatic.     Mouth/Throat:     Mouth: Mucous membranes are moist.  Cardiovascular:     Rate and Rhythm: Normal rate and regular rhythm.  Pulmonary:     Effort: Pulmonary effort is normal. No respiratory distress.  Abdominal:     Palpations: Abdomen is soft.     Tenderness: There is no abdominal tenderness.  Musculoskeletal:     Cervical back: Neck supple.     Comments: Swelling noted of the right ankle, there is some bruising laterally along the foot, no proximal fibular tenderness, 2+ DP pulse  Skin:    General: Skin is warm and dry.  Neurological:     Mental Status: He is alert and oriented to person, place, and time.  Psychiatric:        Mood and Affect: Mood normal.    ED Results / Procedures / Treatments   Labs (all labs ordered are listed, but only abnormal results are displayed) Labs Reviewed - No data to display  EKG None  Radiology DG Ankle Complete Right  Result Date: 07/22/2021 CLINICAL DATA:  Fall, right ankle pain EXAM: RIGHT ANKLE - COMPLETE 3+ VIEW COMPARISON:  None. FINDINGS: No fracture or dislocation is seen. The ankle mortise is intact. Moderate lateral soft tissue swelling. IMPRESSION: Moderate lateral soft tissue swelling. No fracture or dislocation is seen. Electronically Signed   By: Julian Hy M.D.   On: 07/22/2021 00:33    Procedures Procedures   Medications Ordered in ED Medications   oxyCODONE-acetaminophen (PERCOCET/ROXICET) 5-325 MG per tablet 1 tablet (1 tablet Oral Given 07/22/21 0130)    ED Course  I have reviewed the triage vital signs and the nursing notes.  Pertinent labs & imaging results that were available during my care of the patient were reviewed by me and considered in my medical decision making (see chart for details).    MDM Rules/Calculators/A&P  Patient presents with injury to the right ankle.  Nontoxic.  Vital signs notable for tachycardia.  Likely pain related.  Patient was given Percocet.  X-rays obtained.  No evidence of acute fracture.  Suspect high ankle sprain.  Patient was given an ASO and crutches.  Recommend ice and elevation.  Naproxen for pain and orthopedic follow-up.  After history, exam, and medical workup I feel the patient has been appropriately medically screened and is safe for discharge home. Pertinent diagnoses were discussed with the patient. Patient was given return precautions.  Final Clinical Impression(s) / ED Diagnoses Final diagnoses:  Sprain of right ankle, unspecified ligament, initial encounter    Rx / DC Orders ED Discharge Orders          Ordered    naproxen (NAPROSYN) 500 MG tablet  2 times daily        07/22/21 0208             Merryl Hacker, MD 07/22/21 [phone]

## 2021-07-22 NOTE — Discharge Instructions (Signed)
Keep ankle iced and elevated.  Take naproxen as needed for pain.  Follow-up with orthopedics.

## 2021-07-24 ENCOUNTER — Other Ambulatory Visit: Payer: Self-pay

## 2021-07-24 ENCOUNTER — Encounter: Payer: Self-pay | Admitting: Orthopedic Surgery

## 2021-07-24 ENCOUNTER — Ambulatory Visit (INDEPENDENT_AMBULATORY_CARE_PROVIDER_SITE_OTHER): Payer: Self-pay | Admitting: Orthopedic Surgery

## 2021-07-24 VITALS — BP 137/93 | HR 134 | Ht 75.0 in | Wt 285.0 lb

## 2021-07-24 DIAGNOSIS — W1842XA Slipping, tripping and stumbling without falling due to stepping into hole or opening, initial encounter: Secondary | ICD-10-CM

## 2021-07-24 DIAGNOSIS — S93401A Sprain of unspecified ligament of right ankle, initial encounter: Secondary | ICD-10-CM

## 2021-07-24 NOTE — Patient Instructions (Addendum)
Provide a letter for work, ok to return on Monday   Instructions  1.  You have sustained an ankle sprain  2.  I encourage you to stay on your feet and gradually remove your walking boot.   3.  Below are some exercises that you can complete on your own to improve your symptoms.  4.  As an alternative, you can search for ankle sprain exercises online, and can see some demonstrations on YouTube  5.  If you are having difficulty with these exercises, we can also prescribe formal physical therapy  Ankle Exercises Ask your health care provider which exercises are safe for you. Do exercises exactly as told by your health care provider and adjust them as directed. It is normal to feel mild stretching, pulling, tightness, or mild discomfort as you do these exercises. Stop right away if you feel sudden pain or your pain gets worse. Do not begin these exercises until told by your health care provider.  Stretching and range-of-motion exercises These exercises warm up your muscles and joints and improve the movement and flexibility of your ankle. These exercises may also help to relieve pain.  Dorsiflexion/plantar flexion  Sit with your R knee straight or bent. Do not rest your foot on anything. Flex your left ankle to tilt the top of your foot toward your shin. This is called dorsiflexion. Hold this position for 5 seconds. Point your toes downward to tilt the top of your foot away from your shin. This is called plantar flexion. Hold this position for 5 seconds. Repeat 10 times. Complete this exercise 2-3 times a day.  As tolerated  Ankle alphabet  Sit with your R foot supported at your lower leg. Do not rest your foot on anything. Make sure your foot has room to move freely. Think of your R foot as a paintbrush: Move your foot to trace each letter of the alphabet in the air. Keep your hip and knee still while you trace the letters. Trace every letter from A to Z. Make the letters as large as you  can without causing or increasing any discomfort.  Repeat 2-3 times. Complete this exercise 2-3 times a day.   Strengthening exercises These exercises build strength and endurance in your ankle. Endurance is the ability to use your muscles for a long time, even after they get tired. Dorsiflexors These are muscles that lift your foot up. Secure a rubber exercise band or tube to an object, such as a table leg, that will stay still when the band is pulled. Secure the other end around your R foot. Sit on the floor, facing the object with your R leg extended. The band or tube should be slightly tense when your foot is relaxed. Slowly flex your R ankle and toes to bring your foot toward your shin. Hold this position for 5 seconds. Slowly return your foot to the starting position, controlling the band as you do that. Repeat 10 times. Complete this exercise 2-3 times a day.  Plantar flexors These are muscles that push your foot down. Sit on the floor with your R leg extended. Loop a rubber exercise band or tube around the ball of your R foot. The ball of your foot is on the walking surface, right under your toes. The band or tube should be slightly tense when your foot is relaxed. Slowly point your toes downward, pushing them away from you. Hold this position for 5 seconds. Slowly release the tension in the band  or tube, controlling smoothly until your foot is back in the starting position. Repeat 10 times. Complete this exercise 2-3 times a day.  Towel curls  Sit in a chair on a non-carpeted surface, and put your feet on the floor. Place a towel in front of your feet. Keeping your heel on the floor, put your R foot on the towel. Pull the towel toward you by grabbing the towel with your toes and curling them under. Keep your heel on the floor. Let your toes relax. Grab the towel again. Keep pulling the towel until it is completely underneath your foot. Repeat 10 times. Complete this exercise  2-3 times a day.  Standing plantar flexion This is an exercise in which you use your toes to lift your body's weight while standing. Stand with your feet shoulder-width apart. Keep your weight spread evenly over the width of your feet while you rise up on your toes. Use a wall or table to steady yourself if needed, but try not to use it for support. If this exercise is too easy, try these options: Shift your weight toward your R leg until you feel challenged. If told by your health care provider, lift your uninjured leg off the floor. Hold this position for 5 seconds. Repeat 10 times. Complete this exercise 2-3 times a day.  Tandem walking Stand with one foot directly in front of the other. Slowly raise your back foot up, lifting your heel before your toes, and place it directly in front of your other foot. Continue to walk in this heel-to-toe way. Have a countertop or wall nearby to use if needed to keep your balance, but try not to hold onto anything for support.  Repeat 10 times. Complete this exercise 2-3 times a day.   Document Revised: 06/03/2018 Document Reviewed: 06/05/2018 Elsevier Patient Education  [employer].

## 2021-07-24 NOTE — Progress Notes (Signed)
New Patient Visit  Assessment: Jay Montgomery is a 35 y.o. male with the following: Right ankle sprain  Plan: Radiographs negative.  He does have swelling and bruising on the lateral ankle.  He prefers the ASO versus a cam walking boot.  Okay for him to return to weightbearing as tolerated.  I provided him with ankle exercises.  He can return to work starting Monday.  If he has any further issues, he will let us know.  Follow-up as needed.  Follow-up: Return if symptoms worsen or fail to improve.  Subjective:  Chief Complaint  Patient presents with   Ankle Injury    DOI 07/21/21 RT ankle//pt stepped in a hole    History of Present Illness: Jay Montgomery is a 35 y.o. male who presents for evaluation of a right ankle injury.  He rolled his ankle just couple of days ago.  He stepped in a hole.  He heard a pop.  He presented to the emergency department and x-rays were negative.  He has been using an ASO splint, as well as crutches.  He has not been bearing weight.   Review of Systems: No fevers or chills No numbness or tingling No chest pain No shortness of breath No bowel or bladder dysfunction No GI distress No headaches   Medical History:  Past Medical History:  Diagnosis Date   Anxiety    Chicken pox    Headache    due to CSF leak   Heart murmur    "as a child"   Spinal stenosis     Past Surgical History:  Procedure Laterality Date   ADENOIDECTOMY     BACK SURGERY  08/17/2017   L4-L5   LUMBAR LAMINECTOMY/DECOMPRESSION MICRODISCECTOMY N/A 12/01/2017   Procedure: REPAIR OF CEREBROSPINAL FLUID LEAK and Placement of Lumbar Drain;  Surgeon: Earnie Larsson, MD;  Location: [facility];  Service: Neurosurgery;  Laterality: N/A;   TONSILLECTOMY      Family History  Problem Relation Age of Onset   Arthritis Mother    Hypertension Mother    COPD Mother    Emphysema Mother    Bipolar disorder Mother    Arthritis Father    Diabetes Father    Bipolar disorder Sister     Schizophrenia Sister    Social History   Tobacco Use   Smoking status: Every Day    Packs/day: 1.00    Types: Cigarettes   Smokeless tobacco: Never  Vaping Use   Vaping Use: Former  Substance Use Topics   Alcohol use: Not Currently    Comment: occassional   Drug use: No    Allergies  Allergen Reactions   Hydroxyzine Other (See Comments)    Dizziness, mental fogginess, nausea   Vicodin [Hydrocodone-Acetaminophen] Hives and Itching    Current Meds  Medication Sig   albuterol (VENTOLIN HFA) 108 (90 Base) MCG/ACT inhaler Inhale 2 puffs into the lungs every 6 (six) hours as needed for wheezing or shortness of breath.   amphetamine-dextroamphetamine (ADDERALL) 20 MG tablet Take 1 tablet (20 mg total) by mouth daily.   amphetamine-dextroamphetamine (ADDERALL) 20 MG tablet Take 1 tablet (20 mg total) by mouth daily.   amphetamine-dextroamphetamine (ADDERALL) 20 MG tablet Take 1 tablet (20 mg total) by mouth daily.   amphetamine-dextroamphetamine (ADDERALL) 30 MG tablet Take 1 tablet by mouth daily.   amphetamine-dextroamphetamine (ADDERALL) 30 MG tablet Take 1 tablet by mouth 2 (two) times daily.   amphetamine-dextroamphetamine (ADDERALL) 30 MG tablet Take 1 tablet by  mouth 2 (two) times daily.   ARIPiprazole (ABILIFY) 20 MG tablet Take 1 tablet (20 mg total) by mouth daily.   benzonatate (TESSALON) 100 MG capsule Take 1 capsule (100 mg total) by mouth 3 (three) times daily as needed for cough.   busPIRone (BUSPAR) 10 MG tablet Take 1 tablet (10 mg total) by mouth 3 (three) times daily.   clonazePAM (KLONOPIN) 1 MG tablet TAKE ONE TABLET BY MOUTH FOUR TIMES A DAY AS NEEDED FOR ANXIETY   naproxen (NAPROSYN) 500 MG tablet Take 1 tablet (500 mg total) by mouth 2 (two) times daily.   oxyCODONE-acetaminophen (PERCOCET/ROXICET) 5-325 MG tablet Take 1 tablet by mouth every 6 (six) hours as needed.    Objective: BP (!) 137/93   Pulse (!) 134   Ht 6\' 3"  (1.905 m)   Wt 285 lb (129.3 kg)    BMI 35.62 kg/m   Physical Exam:  General: Alert and oriented. and No acute distress. Gait: Unable to ambulate.  Evaluation of the right ankle demonstrates swelling and bruising over the lateral ankle.  Tenderness to palpation over the anterior lateral ankle.  Tenderness to palpation along the peroneal tendons.  Active motion intact to the TA/EHL.  He tolerates gentle dorsiflexion and plantarflexion of the ankle.  Toes are warm and well-perfused.  Sensation is intact over the dorsum of the foot.  IMAGING: I personally reviewed images previously obtained from the ED  X-rays of the right ankle were obtained in the emergency department and are without acute injury.  No fractures noted.  Swelling of the lateral ankle.  New Medications:  No orders of the defined types were placed in this encounter.     Mordecai Rasmussen, MD  07/24/2021 10:52 PM

## 2021-09-22 ENCOUNTER — Other Ambulatory Visit: Payer: Self-pay

## 2021-09-22 ENCOUNTER — Telehealth: Payer: Self-pay | Admitting: Adult Health

## 2021-09-22 DIAGNOSIS — F411 Generalized anxiety disorder: Secondary | ICD-10-CM

## 2021-09-22 DIAGNOSIS — F909 Attention-deficit hyperactivity disorder, unspecified type: Secondary | ICD-10-CM

## 2021-09-22 DIAGNOSIS — F331 Major depressive disorder, recurrent, moderate: Secondary | ICD-10-CM

## 2021-09-22 DIAGNOSIS — F39 Unspecified mood [affective] disorder: Secondary | ICD-10-CM

## 2021-09-22 MED ORDER — ARIPIPRAZOLE 20 MG PO TABS: 20.0000 mg | ORAL_TABLET | Freq: Every day | ORAL | 0 refills | Status: DC

## 2021-09-22 MED ORDER — AMPHETAMINE-DEXTROAMPHETAMINE 30 MG PO TABS: 30.0000 mg | ORAL_TABLET | Freq: Every day | ORAL | 0 refills | Status: DC

## 2021-09-22 MED ORDER — CLONAZEPAM 1 MG PO TABS: ORAL_TABLET | ORAL | 0 refills | Status: DC

## 2021-09-22 MED ORDER — AMPHETAMINE-DEXTROAMPHETAMINE 20 MG PO TABS: 20.0000 mg | ORAL_TABLET | Freq: Every day | ORAL | 0 refills | Status: DC

## 2021-09-22 NOTE — Telephone Encounter (Signed)
Pt needs refills on all meds Abilify, Adderall 30 mg 2/d and Adderall 20 mg 1/d , Busbar and Clonazepam  @ [facility] [place]. [address]

## 2021-09-22 NOTE — Telephone Encounter (Signed)
Pended all

## 2021-10-23 ENCOUNTER — Ambulatory Visit: Payer: BC Managed Care – PPO | Admitting: Adult Health

## 2021-11-23 ENCOUNTER — Encounter (HOSPITAL_COMMUNITY): Payer: Self-pay

## 2021-11-23 ENCOUNTER — Emergency Department (HOSPITAL_COMMUNITY)
Admission: EM | Admit: 2021-11-23 | Discharge: 2021-11-23 | Disposition: A | Discharge status: Home / Self Care | Payer: Self-pay | Attending: Emergency Medicine | Admitting: Emergency Medicine

## 2021-11-23 ENCOUNTER — Other Ambulatory Visit: Payer: Self-pay

## 2021-11-23 DIAGNOSIS — K2900 Acute gastritis without bleeding: Secondary | ICD-10-CM

## 2021-11-23 DIAGNOSIS — R1110 Vomiting, unspecified: Secondary | ICD-10-CM | POA: Insufficient documentation

## 2021-11-23 LAB — PROTIME-INR
INR: 0.9 (ref 0.8–1.2)
Prothrombin Time: 12.3 seconds (ref 11.4–15.2)

## 2021-11-23 LAB — CBC WITH DIFFERENTIAL/PLATELET
Abs Immature Granulocytes: 0.02 10*3/uL (ref 0.00–0.07)
Basophils Absolute: 0 10*3/uL (ref 0.0–0.1)
Basophils Relative: 0 %
Eosinophils Absolute: 0.3 10*3/uL (ref 0.0–0.5)
Eosinophils Relative: 4 %
HCT: 46.5 % (ref 39.0–52.0)
Hemoglobin: 15.4 g/dL (ref 13.0–17.0)
Immature Granulocytes: 0 %
Lymphocytes Relative: 10 %
Lymphs Abs: 0.7 10*3/uL (ref 0.7–4.0)
MCH: 31.2 pg (ref 26.0–34.0)
MCHC: 33.1 g/dL (ref 30.0–36.0)
MCV: 94.3 fL (ref 80.0–100.0)
Monocytes Absolute: 0.5 10*3/uL (ref 0.1–1.0)
Monocytes Relative: 7 %
Neutro Abs: 5.7 10*3/uL (ref 1.7–7.7)
Neutrophils Relative %: 79 %
Platelets: 205 10*3/uL (ref 150–400)
RBC: 4.93 MIL/uL (ref 4.22–5.81)
RDW: 13.2 % (ref 11.5–15.5)
WBC: 7.2 10*3/uL (ref 4.0–10.5)
nRBC: 0 % (ref 0.0–0.2)

## 2021-11-23 LAB — COMPREHENSIVE METABOLIC PANEL
ALT: 24 U/L (ref 0–44)
AST: 25 U/L (ref 15–41)
Albumin: 4 g/dL (ref 3.5–5.0)
Alkaline Phosphatase: 74 U/L (ref 38–126)
Anion gap: 7 (ref 5–15)
BUN: 14 mg/dL (ref 6–20)
CO2: 27 mmol/L (ref 22–32)
Calcium: 8.6 mg/dL — ABNORMAL LOW (ref 8.9–10.3)
Chloride: 104 mmol/L (ref 98–111)
Creatinine, Ser: 1 mg/dL (ref 0.61–1.24)
GFR, Estimated: >60 mL/min (ref >60)
Glucose, Bld: 113 mg/dL — ABNORMAL HIGH (ref 70–99)
Potassium: 4.1 mmol/L (ref 3.5–5.1)
Sodium: 138 mmol/L (ref 135–145)
Total Bilirubin: 0.6 mg/dL (ref 0.3–1.2)
Total Protein: 7.2 g/dL (ref 6.5–8.1)

## 2021-11-23 LAB — LIPASE, BLOOD: Lipase: 33 U/L (ref 11–51)

## 2021-11-23 MED ORDER — PANTOPRAZOLE SODIUM 20 MG PO TBEC: 20.0000 mg | DELAYED_RELEASE_TABLET | Freq: Every day | ORAL | 0 refills | Status: DC

## 2021-11-23 MED ORDER — PANTOPRAZOLE SODIUM 40 MG IV SOLR
40.0000 mg | Freq: Once | INTRAVENOUS | Status: AC
  Administered 2021-11-23: 40 mg via INTRAVENOUS
  Filled 2021-11-23: qty 10

## 2021-11-23 MED ORDER — SODIUM CHLORIDE 0.9 % IV BOLUS
1000.0000 mL | Freq: Once | INTRAVENOUS | Status: AC
  Administered 2021-11-23: 1000 mL via INTRAVENOUS

## 2021-11-23 MED ORDER — ONDANSETRON 4 MG PO TBDP: ORAL_TABLET | ORAL | 0 refills | Status: DC

## 2021-11-23 NOTE — ED Provider Notes (Signed)
?[facility] ?Provider Note ? ? ?CSN: [id number] ?Arrival date & time: 11/23/21  3086 ? ?  ? ?History ? ?Chief Complaint  ?Patient presents with  ? Emesis  ? ? ?Jay Montgomery is a 36 y.o. male. ? ?Patient with no past medical history.  He was vomiting today and there was some dark red material with it.  Patient having no pain now.  Patient has a history of ADHD ? ?The history is provided by the patient and medical records. No language interpreter was used.  ?Emesis ?Severity:  Moderate ?Timing:  Intermittent ?Quality:  Coffee grounds ?Able to tolerate:  Liquids ?Progression:  Worsening ?Chronicity:  New ?Recent urination:  Normal ?Relieved by:  Nothing ?Worsened by:  Nothing ?Ineffective treatments:  None tried ?Associated symptoms: no abdominal pain, no cough, no diarrhea and no headaches   ?Risk factors: no alcohol use   ? ?  ? ?Home Medications ?Prior to Admission medications   ?Medication Sig Start Date End Date Taking? Authorizing Provider  ?ondansetron (ZOFRAN-ODT) 4 MG disintegrating tablet '4mg'$  ODT q4 hours prn nausea/vomit 11/23/21  Yes Milton Ferguson, MD  ?pantoprazole (PROTONIX) 20 MG tablet Take 1 tablet (20 mg total) by mouth daily. 11/23/21  Yes Milton Ferguson, MD  ?albuterol (VENTOLIN HFA) 108 (90 Base) MCG/ACT inhaler Inhale 2 puffs into the lungs every 6 (six) hours as needed for wheezing or shortness of breath. 06/09/21   Brunetta Jeans, PA-C  ?amphetamine-dextroamphetamine (ADDERALL) 20 MG tablet Take 1 tablet (20 mg total) by mouth daily. 05/21/21   Mozingo, Berdie Ogren, NP  ?amphetamine-dextroamphetamine (ADDERALL) 20 MG tablet Take 1 tablet (20 mg total) by mouth daily. 06/18/21   Mozingo, Berdie Ogren, NP  ?amphetamine-dextroamphetamine (ADDERALL) 20 MG tablet Take 1 tablet (20 mg total) by mouth daily. 09/22/21   Mozingo, Berdie Ogren, NP  ?amphetamine-dextroamphetamine (ADDERALL) 30 MG tablet Take 1 tablet by mouth 2 (two) times daily. 05/21/21   Mozingo, Berdie Ogren, NP  ?amphetamine-dextroamphetamine (ADDERALL) 30 MG tablet Take 1 tablet by mouth 2 (two) times daily. 06/18/21   Mozingo, Berdie Ogren, NP  ?amphetamine-dextroamphetamine (ADDERALL) 30 MG tablet Take 1 tablet by mouth daily. 09/22/21   Mozingo, Berdie Ogren, NP  ?ARIPiprazole (ABILIFY) 20 MG tablet Take 1 tablet (20 mg total) by mouth daily. 09/22/21   Mozingo, Berdie Ogren, NP  ?benzonatate (TESSALON) 100 MG capsule Take 1 capsule (100 mg total) by mouth 3 (three) times daily as needed for cough. 06/09/21   Brunetta Jeans, PA-C  ?busPIRone (BUSPAR) 10 MG tablet Take 1 tablet (10 mg total) by mouth 3 (three) times daily. 04/23/21   Mozingo, Berdie Ogren, NP  ?clonazePAM (KLONOPIN) 1 MG tablet TAKE ONE TABLET BY MOUTH FOUR TIMES A DAY AS NEEDED FOR ANXIETY 09/22/21   Mozingo, Berdie Ogren, NP  ?naproxen (NAPROSYN) 500 MG tablet Take 1 tablet (500 mg total) by mouth 2 (two) times daily. 07/22/21   Horton, Barbette Hair, MD  ?oxyCODONE-acetaminophen (PERCOCET/ROXICET) 5-325 MG tablet Take 1 tablet by mouth every 6 (six) hours as needed. 06/23/20   Milton Ferguson, MD  ?   ? ?Allergies    ?Hydroxyzine and Vicodin [hydrocodone-acetaminophen]   ? ?Review of Systems   ?Review of Systems  ?Constitutional:  Negative for appetite change and fatigue.  ?HENT:  Negative for congestion, ear discharge and sinus pressure.   ?Eyes:  Negative for discharge.  ?Respiratory:  Negative for cough.   ?Cardiovascular:  Negative for chest pain.  ?Gastrointestinal:  Positive for vomiting.  Negative for abdominal pain and diarrhea.  ?Genitourinary:  Negative for frequency and hematuria.  ?Musculoskeletal:  Negative for back pain.  ?Skin:  Negative for rash.  ?Neurological:  Negative for seizures and headaches.  ?Psychiatric/Behavioral:  Negative for hallucinations.   ? ?Physical Exam ?Updated Vital Signs ?BP 116/77 (BP Location: Right Arm)   Pulse 71   Temp 98.6 ?F (37 ?C) (Oral)   Resp 16   Ht '6\' 3"'$  (1.905 m)   Wt 102.1 kg    SpO2 99%   BMI 28.12 kg/m?  ?Physical Exam ?Vitals and nursing note reviewed.  ?Constitutional:   ?   Appearance: He is well-developed.  ?HENT:  ?   Head: Normocephalic.  ?   Nose: Nose normal.  ?Eyes:  ?   General: No scleral icterus. ?   Conjunctiva/sclera: Conjunctivae normal.  ?Neck:  ?   Thyroid: No thyromegaly.  ?Cardiovascular:  ?   Rate and Rhythm: Normal rate and regular rhythm.  ?   Heart sounds: No murmur heard. ?  No friction rub. No gallop.  ?Pulmonary:  ?   Breath sounds: No stridor. No wheezing or rales.  ?Chest:  ?   Chest wall: No tenderness.  ?Abdominal:  ?   General: There is no distension.  ?   Tenderness: There is no abdominal tenderness. There is no rebound.  ?Musculoskeletal:     ?   General: Normal range of motion.  ?   Cervical back: Neck supple.  ?Lymphadenopathy:  ?   Cervical: No cervical adenopathy.  ?Skin: ?   Findings: No erythema or rash.  ?Neurological:  ?   Mental Status: He is alert and oriented to person, place, and time.  ?   Motor: No abnormal muscle tone.  ?   Coordination: Coordination normal.  ?Psychiatric:     ?   Behavior: Behavior normal.  ? ? ?ED Results / Procedures / Treatments   ?Labs ?(all labs ordered are listed, but only abnormal results are displayed) ?Labs Reviewed  ?COMPREHENSIVE METABOLIC PANEL - Abnormal; Notable for the following components:  ?    Result Value  ? Glucose, Bld 113 (*)   ? Calcium 8.6 (*)   ? All other components within normal limits  ?CBC WITH DIFFERENTIAL/PLATELET  ?PROTIME-INR  ?LIPASE, BLOOD  ? ? ?EKG ?None ? ?Radiology ?No results found. ? ?Procedures ?Procedures  ? ? ?Medications Ordered in ED ?Medications  ?pantoprazole (PROTONIX) injection 40 mg (40 mg Intravenous Given 11/23/21 0755)  ?sodium chloride 0.9 % bolus 1,000 mL (0 mLs Intravenous Stopped 11/23/21 0906)  ? ? ?ED Course/ Medical Decision Making/ A&P ?  ?                        ?Medical Decision Making ?Amount and/or Complexity of Data Reviewed ?Labs: ordered. ? ?Risk ?Prescription  drug management. ? ?This patient presents to the ED for concern of vomiting, this involves an extensive number of treatment options, and is a complaint that carries with it a high risk of complications and morbidity.  The differential diagnosis includes gastritis, pancreatitis ? ? ?Co morbidities that complicate the patient evaluation ? ?None ? ? ?Additional history obtained: ? ?Additional history obtained from patient ?External records from outside source obtained and reviewed including hospital records ? ? ?Lab Tests: ? ?I Ordered, and personally interpreted labs.  The pertinent results include: CBC and chemistries that were unremarkable ? ? ?Imaging Studies ordered: ?No x-rays ? ?Cardiac Monitoring: ? ?The patient  was maintained on a cardiac monitor.  I personally viewed and interpreted the cardiac monitored which showed an underlying rhythm of: Normal sinus rhythm ? ? ?Medicines ordered and prescription drug management: ? ?I ordered medication including Protonix and normal saline ?Reevaluation of the patient after these medicines showed that the patient improved ?I have reviewed the patients home medicines and have made adjustments as needed ? ? ?Test Considered: ? ?CT abdomen ? ? ?Critical Interventions: ? ?None ? ? ?Consultations Obtained: ? ?No consult ? ?Problem List / ED Course: ? ?Vomiting blood probable gastritis ? ? ? ?Social Determinants of Health: ? ?None ? ? ? ? ? ? ?Labs unremarkable.  Vital signs normal.  Patient put on protonic and Zofran and will follow-up with PCP ? ? ? ? ? ? ? ?Final Clinical Impression(s) / ED Diagnoses ?Final diagnoses:  ?None  ? ? ?Rx / DC Orders ?ED Discharge Orders   ? ?      Ordered  ?  ondansetron (ZOFRAN-ODT) 4 MG disintegrating tablet       ? 11/23/21 0910  ?  pantoprazole (PROTONIX) 20 MG tablet  Daily       ? 11/23/21 0910  ? ?  ?  ? ?  ? ? ?  ?Milton Ferguson, MD ?11/24/21 1805 ? ?

## 2021-11-23 NOTE — ED Triage Notes (Signed)
This am pt vomited 3 times, was dark red in color, and stringy.  Did say he had a cough for a couple of days ?

## 2021-11-23 NOTE — Discharge Instructions (Signed)
Call your doctor and make an appointment this week for recheck.  Take liquids only today.  Return if not improving ?

## 2021-12-02 ENCOUNTER — Encounter: Payer: Self-pay | Admitting: Adult Health

## 2021-12-02 ENCOUNTER — Other Ambulatory Visit: Payer: Self-pay

## 2021-12-02 ENCOUNTER — Ambulatory Visit: Payer: Self-pay | Admitting: Adult Health

## 2021-12-02 DIAGNOSIS — F39 Unspecified mood [affective] disorder: Secondary | ICD-10-CM

## 2021-12-02 DIAGNOSIS — F411 Generalized anxiety disorder: Secondary | ICD-10-CM

## 2021-12-02 DIAGNOSIS — F331 Major depressive disorder, recurrent, moderate: Secondary | ICD-10-CM

## 2021-12-02 DIAGNOSIS — F909 Attention-deficit hyperactivity disorder, unspecified type: Secondary | ICD-10-CM

## 2021-12-02 MED ORDER — CLONAZEPAM 1 MG PO TABS: ORAL_TABLET | ORAL | 2 refills | Status: DC

## 2021-12-02 MED ORDER — AMPHETAMINE-DEXTROAMPHETAMINE 20 MG PO TABS: 20.0000 mg | ORAL_TABLET | Freq: Every day | ORAL | 0 refills | Status: DC

## 2021-12-02 MED ORDER — ARIPIPRAZOLE 10 MG PO TABS: 10.0000 mg | ORAL_TABLET | Freq: Two times a day (BID) | ORAL | 5 refills | Status: DC

## 2021-12-02 MED ORDER — AMPHETAMINE-DEXTROAMPHETAMINE 30 MG PO TABS: 30.0000 mg | ORAL_TABLET | Freq: Every day | ORAL | 0 refills | Status: DC

## 2021-12-02 MED ORDER — BUSPIRONE HCL 10 MG PO TABS: 10.0000 mg | ORAL_TABLET | Freq: Three times a day (TID) | ORAL | 5 refills | Status: DC

## 2021-12-02 NOTE — Progress Notes (Signed)
Jay Montgomery ?[id number] ?04/22/1986 ?36 y.o. ? ?Subjective:  ? ?Patient ID:  Jay Montgomery is a 36 y.o. (DOB Dec 27, 1985) male. ? ?Chief Complaint: No chief complaint on file. ? ? ?HPI ?Jay Montgomery presents to the office today for follow-up of GAD, MDD, insomnia, ADHD, and BPD 1. ? ?Describes mood today as "ok". Pleasant. Mood symptoms - reports depression, anxiety, irritability. Reports panic attacks. Reports mood instability - more down lately. Stating "I feel like my skin is crawling". Has been out of medications for about a month. Does not have insurance and has been unable to get medications. Stating "my mood is getting worse". Would like to restart previous medications.  Feels like current medications are working well for him. Stable interest and motivation. Taking medications as prescribed. ?Energy levels are "non existent". Active, does not have a regular exercise routine. ?Enjoys some usual interests. Single. Lives with family - fiance and 4 children. Family local and supportive.  ?Appetite increased. Weight gain - 225 pounds - 75". ?Sleeps well most nights. Averages 4 to 5 hours. ?Focus and concentration difficulties. Completing tasks. Manages aspects of household. Working full time.   ?Denies SI or HI.  ?Denies AH or VH. ? ?Medication trials: Xanax, Celexa, Valium ? ? ?GAD-7   ? ?Jay Montgomery Office Visit from 02/15/2019 in [facility] Office Visit from 01/31/2019 in [facility] Office Visit from 01/25/2019 in [facility]  ?Total GAD-7 Score '3 11 18  '$ ? ?  ? ?PHQ2-9   ? ?Flowsheet Row Video Visit from 04/29/2020 in [facility] Office Visit from 01/25/2019 in [facility]  ?PHQ-2 Total Score 0 0  ?PHQ-9 Total Score 1 --  ? ?  ? ?[facility] from 11/23/2021 in [facility] from  07/22/2021 in [facility] from 11/21/2020 in [facility] [place]  ?C-SSRS RISK CATEGORY No Risk No Risk No Risk  ? ?  ?  ? ?Review of Systems:  ?Review of Systems  ?Musculoskeletal:  Negative for gait problem.  ?Neurological:  Negative for tremors.  ?Psychiatric/Behavioral:    ?     Please refer to HPI  ? ?Medications: I have reviewed the patient's current medications. ? ?Current Outpatient Medications  ?Medication Sig Dispense Refill  ? albuterol (VENTOLIN HFA) 108 (90 Base) MCG/ACT inhaler Inhale 2 puffs into the lungs every 6 (six) hours as needed for wheezing or shortness of breath. 8 g 0  ? amphetamine-dextroamphetamine (ADDERALL) 20 MG tablet Take 1 tablet (20 mg total) by mouth daily. 30 tablet 0  ? amphetamine-dextroamphetamine (ADDERALL) 20 MG tablet Take 1 tablet (20 mg total) by mouth daily. 30 tablet 0  ? amphetamine-dextroamphetamine (ADDERALL) 20 MG tablet Take 1 tablet (20 mg total) by mouth daily. 30 tablet 0  ? amphetamine-dextroamphetamine (ADDERALL) 30 MG tablet Take 1 tablet by mouth 2 (two) times daily. 60 tablet 0  ? amphetamine-dextroamphetamine (ADDERALL) 30 MG tablet Take 1 tablet by mouth 2 (two) times daily. 60 tablet 0  ? amphetamine-dextroamphetamine (ADDERALL) 30 MG tablet Take 1 tablet by mouth daily. 60 tablet 0  ? ARIPiprazole (ABILIFY) 10 MG tablet Take 1 tablet (10 mg total) by mouth 2 (two) times daily. 60 tablet 5  ? benzonatate (TESSALON) 100 MG capsule Take 1 capsule (100 mg total) by mouth 3 (three) times daily as needed for cough. 30 capsule 0  ? busPIRone (BUSPAR)  10 MG tablet Take 1 tablet (10 mg total) by mouth 3 (three) times daily. 90 tablet 5  ? clonazePAM (KLONOPIN) 1 MG tablet TAKE ONE TABLET BY MOUTH FOUR TIMES A DAY AS NEEDED FOR ANXIETY 120 tablet 2  ? naproxen (NAPROSYN) 500 MG tablet Take 1 tablet (500 mg total) by mouth 2 (two) times daily. 30 tablet 0  ? ondansetron (ZOFRAN-ODT) 4 MG disintegrating tablet '4mg'$  ODT q4 hours  prn nausea/vomit 12 tablet 0  ? oxyCODONE-acetaminophen (PERCOCET/ROXICET) 5-325 MG tablet Take 1 tablet by mouth every 6 (six) hours as needed. 20 tablet 0  ? pantoprazole (PROTONIX) 20 MG tablet Take 1 tablet (20 mg total) by mouth daily. 30 tablet 0  ? ?No current facility-administered medications for this visit.  ? ? ?Medication Side Effects: None ? ?Allergies:  ?Allergies  ?Allergen Reactions  ? Hydroxyzine Other (See Comments)  ?  Dizziness, mental fogginess, nausea  ? Vicodin [Hydrocodone-Acetaminophen] Hives and Itching  ? ? ?Past Medical History:  ?Diagnosis Date  ? Anxiety   ? Chicken pox   ? Headache   ? due to CSF leak  ? Heart murmur   ? "as a child"  ? Spinal stenosis   ? ? ?Past Medical History, Surgical history, Social history, and Family history were reviewed and updated as appropriate.  ? ?Please see review of systems for further details on the patient's review from today.  ? ?Objective:  ? ?Physical Exam:  ?There were no vitals taken for this visit. ? ?Physical Exam ?Constitutional:   ?   General: He is not in acute distress. ?Musculoskeletal:     ?   General: No deformity.  ?Neurological:  ?   Mental Status: He is alert and oriented to person, place, and time.  ?   Coordination: Coordination normal.  ?Psychiatric:     ?   Attention and Perception: Attention and perception normal. He does not perceive auditory or visual hallucinations.     ?   Mood and Affect: Mood normal. Mood is not anxious or depressed. Affect is not labile, blunt, angry or inappropriate.     ?   Speech: Speech normal.     ?   Behavior: Behavior normal.     ?   Thought Content: Thought content normal. Thought content is not paranoid or delusional. Thought content does not include homicidal or suicidal ideation. Thought content does not include homicidal or suicidal plan.     ?   Cognition and Memory: Cognition and memory normal.     ?   Judgment: Judgment normal.  ?   Comments: Insight intact  ? ? ?Lab Review:  ?   ?Component  Value Date/Time  ? NA 138 11/23/2021 0740  ? K 4.1 11/23/2021 0740  ? CL 104 11/23/2021 0740  ? CO2 27 11/23/2021 0740  ? GLUCOSE 113 (H) 11/23/2021 0740  ? BUN 14 11/23/2021 0740  ? CREATININE 1.00 11/23/2021 0740  ? CALCIUM 8.6 (L) 11/23/2021 0740  ? PROT 7.2 11/23/2021 0740  ? ALBUMIN 4.0 11/23/2021 0740  ? AST 25 11/23/2021 0740  ? ALT 24 11/23/2021 0740  ? ALKPHOS 74 11/23/2021 0740  ? BILITOT 0.6 11/23/2021 0740  ? GFRNONAA >60 11/23/2021 0740  ? GFRAA >60 06/23/2020 0620  ? ? ?   ?Component Value Date/Time  ? WBC 7.2 11/23/2021 0740  ? RBC 4.93 11/23/2021 0740  ? HGB 15.4 11/23/2021 0740  ? HCT 46.5 11/23/2021 0740  ? PLT 205 11/23/2021 0740  ?  MCV 94.3 11/23/2021 0740  ? MCH 31.2 11/23/2021 0740  ? MCHC 33.1 11/23/2021 0740  ? RDW 13.2 11/23/2021 0740  ? LYMPHSABS 0.7 11/23/2021 0740  ? MONOABS 0.5 11/23/2021 0740  ? EOSABS 0.3 11/23/2021 0740  ? BASOSABS 0.0 11/23/2021 0740  ? ? ?No results found for: POCLITH, LITHIUM  ? ?No results found for: PHENYTOIN, PHENOBARB, VALPROATE, CBMZ  ? ?.res ?Assessment: Plan:   ? ?Plan: ? ?Abilify '20mg'$  daily to target mood instability.  ?Buspar '10mg'$  TID  ?Klonopin '1mg'$  4 times daily. ?Adderall '30mg'$  BID  ?Adderall '20mg'$  in the afternoon for work - evening shift ? ?ADHD outside testing - tested 5 - adult ADHD in office today. Consider referral for psychological testing. ? ?RTC 3 months  ? ?130/96/88 ? ?Discussed potential metabolic side effects associated with atypical antipsychotics, as well as potential risk for movement side effects. Advised pt to contact office if movement side effects occur.  ? ?Discussed potential benefits, risks, and side effects of stimulants with patient to include increased heart rate, palpitations, insomnia, increased anxiety, increased irritability, or decreased appetite.  Instructed patient to contact office if experiencing any significant tolerability issues. ? ?Discussed potential benefits, risk, and side effects of benzodiazepines to include  potential risk of tolerance and dependence, as well as possible drowsiness.  Advised patient not to drive if experiencing drowsiness and to take lowest possible effective dose to minimize risk of dependence

## 2022-03-03 DIAGNOSIS — N179 Acute kidney failure, unspecified: Secondary | ICD-10-CM | POA: Insufficient documentation

## 2022-03-03 DIAGNOSIS — M6282 Rhabdomyolysis: Secondary | ICD-10-CM | POA: Insufficient documentation

## 2022-03-04 ENCOUNTER — Ambulatory Visit: Payer: Self-pay | Admitting: Adult Health

## 2022-03-04 DIAGNOSIS — F909 Attention-deficit hyperactivity disorder, unspecified type: Secondary | ICD-10-CM | POA: Insufficient documentation

## 2022-03-04 DIAGNOSIS — R6510 Systemic inflammatory response syndrome (SIRS) of non-infectious origin without acute organ dysfunction: Secondary | ICD-10-CM | POA: Insufficient documentation

## 2022-03-04 DIAGNOSIS — F429 Obsessive-compulsive disorder, unspecified: Secondary | ICD-10-CM | POA: Insufficient documentation

## 2022-03-04 DIAGNOSIS — F4310 Post-traumatic stress disorder, unspecified: Secondary | ICD-10-CM | POA: Insufficient documentation

## 2022-03-04 DIAGNOSIS — R7401 Elevation of levels of liver transaminase levels: Secondary | ICD-10-CM | POA: Insufficient documentation

## 2022-03-04 DIAGNOSIS — F319 Bipolar disorder, unspecified: Secondary | ICD-10-CM | POA: Insufficient documentation

## 2022-03-12 ENCOUNTER — Ambulatory Visit (INDEPENDENT_AMBULATORY_CARE_PROVIDER_SITE_OTHER): Payer: Self-pay | Admitting: Adult Health

## 2022-03-12 ENCOUNTER — Encounter: Payer: Self-pay | Admitting: Adult Health

## 2022-03-12 DIAGNOSIS — F909 Attention-deficit hyperactivity disorder, unspecified type: Secondary | ICD-10-CM

## 2022-03-12 DIAGNOSIS — F39 Unspecified mood [affective] disorder: Secondary | ICD-10-CM

## 2022-03-12 DIAGNOSIS — F411 Generalized anxiety disorder: Secondary | ICD-10-CM

## 2022-03-12 DIAGNOSIS — F331 Major depressive disorder, recurrent, moderate: Secondary | ICD-10-CM

## 2022-03-12 MED ORDER — CLONAZEPAM 1 MG PO TABS: ORAL_TABLET | ORAL | 2 refills | Status: DC

## 2022-03-12 MED ORDER — BUSPIRONE HCL 10 MG PO TABS: 10.0000 mg | ORAL_TABLET | Freq: Three times a day (TID) | ORAL | 5 refills | Status: DC

## 2022-03-12 MED ORDER — AMPHETAMINE-DEXTROAMPHETAMINE 20 MG PO TABS
20.0000 mg | ORAL_TABLET | Freq: Every day | ORAL | 0 refills | Status: DC
Start: 2022-03-12 — End: 2022-05-21

## 2022-03-12 MED ORDER — ARIPIPRAZOLE 10 MG PO TABS: 10.0000 mg | ORAL_TABLET | Freq: Two times a day (BID) | ORAL | 5 refills | Status: DC

## 2022-03-12 MED ORDER — AMPHETAMINE-DEXTROAMPHETAMINE 30 MG PO TABS: 30.0000 mg | ORAL_TABLET | Freq: Every day | ORAL | 0 refills | Status: DC

## 2022-04-13 ENCOUNTER — Other Ambulatory Visit: Payer: Self-pay

## 2022-04-13 ENCOUNTER — Telehealth: Payer: Self-pay | Admitting: Adult Health

## 2022-04-13 DIAGNOSIS — F909 Attention-deficit hyperactivity disorder, unspecified type: Secondary | ICD-10-CM

## 2022-04-13 MED ORDER — AMPHETAMINE-DEXTROAMPHETAMINE 20 MG PO TABS: 20.0000 mg | ORAL_TABLET | Freq: Every day | ORAL | 0 refills | Status: DC

## 2022-04-13 MED ORDER — AMPHETAMINE-DEXTROAMPHETAMINE 30 MG PO TABS: 30.0000 mg | ORAL_TABLET | Freq: Two times a day (BID) | ORAL | 0 refills | Status: DC

## 2022-04-13 NOTE — Telephone Encounter (Signed)
Wife, Aniceto Boss, called this morning at 9:58 to request refills of both [name].  Appt 9/22. Send to [facility] at [facility].

## 2022-04-13 NOTE — Telephone Encounter (Signed)
Pended.

## 2022-04-23 ENCOUNTER — Ambulatory Visit (INDEPENDENT_AMBULATORY_CARE_PROVIDER_SITE_OTHER): Payer: Self-pay | Admitting: Adult Health

## 2022-04-23 ENCOUNTER — Encounter: Payer: Self-pay | Admitting: Adult Health

## 2022-04-23 DIAGNOSIS — G4700 Insomnia, unspecified: Secondary | ICD-10-CM

## 2022-04-23 DIAGNOSIS — F319 Bipolar disorder, unspecified: Secondary | ICD-10-CM

## 2022-04-23 DIAGNOSIS — F331 Major depressive disorder, recurrent, moderate: Secondary | ICD-10-CM

## 2022-04-23 DIAGNOSIS — F411 Generalized anxiety disorder: Secondary | ICD-10-CM

## 2022-04-23 DIAGNOSIS — F902 Attention-deficit hyperactivity disorder, combined type: Secondary | ICD-10-CM

## 2022-04-23 MED ORDER — BUPROPION HCL ER (XL) 150 MG PO TB24: ORAL_TABLET | ORAL | 2 refills | Status: DC

## 2022-04-23 NOTE — Progress Notes (Signed)
Jay Montgomery [id number] May 12, 1986 36 y.o.  Subjective:   Patient ID:  Jay Montgomery is a 36 y.o. (DOB April 21, 1986) male.  Chief Complaint: No chief complaint on file.   HPI Jay Montgomery presents to the office today for follow-up of  GAD, MDD, insomnia, ADHD, and BPD 1.  Describes mood today as "ok". Pleasant. Mood symptoms - reports increased depression, anxiety and irritability. Reports increased panic attacks. Reports mood as lower. Stating "my mood has declined and I don't know why". Has been out of work for 2 weeks. Stating "I know I need to go, but I can't make myself". Feels like he has periods of doing ok and then he doesn't. Stating "I will be fine for a while and then I'm not". Hasn't been to work in 2 weeks - "I like my job". Feels like medications are helpful, but may need to be adjusted. Stable interest and motivation. Taking medications as prescribed. Energy levels vary. Active, does not have a regular exercise routine. Enjoys some usual interests. Lives with family - fiance and 4 children. Family local and supportive.  Appetite increased. Weight gain - 233 pounds - 75". Sleeps well most nights. Averages 6 to 8 hours. Focus and concentration difficulties - "I always feel bored - all the time". Completing tasks. Manages aspects of household. Working full time - Systems developer.   Denies SI or HI.  Denies AH or VH. Denies self harm.  Denies substance use.  Medication trials: Xanax, Celexa, Valium   GAD-7    Flowsheet Row Office Visit from 02/15/2019 in [facility] Office Visit from 01/31/2019 in [facility] Office Visit from 01/25/2019 in [facility]  Total GAD-7 Score '3 11 18      '$ PHQ2-9    [facility] Video Visit from 04/29/2020 in [facility] Office Visit from 01/25/2019 in [facility]  PHQ-2 Total Score 0 0  PHQ-9 Total Score 1 --      [facility] from 11/23/2021 in [facility] from 07/22/2021 in [facility] from 11/21/2020 in [facility] [place] No Risk No Risk No Risk        Review of Systems:  Review of Systems  Musculoskeletal:  Negative for gait problem.  Neurological:  Negative for tremors.  Psychiatric/Behavioral:         Please refer to HPI    Medications: I have reviewed the patient's current medications.  Current Outpatient Medications  Medication Sig Dispense Refill   buPROPion (WELLBUTRIN XL) 150 MG 24 hr tablet Take one tablet every morning for 7 days, then increase to two tablets every morning. 60 tablet 2   albuterol (VENTOLIN HFA) 108 (90 Base) MCG/ACT inhaler Inhale 2 puffs into the lungs every 6 (six) hours as needed for wheezing or shortness of breath. 8 g 0   amphetamine-dextroamphetamine (ADDERALL) 20 MG tablet Take 1 tablet (20 mg total) by mouth daily. 30 tablet 0   amphetamine-dextroamphetamine (ADDERALL) 20 MG tablet Take 1 tablet (20 mg total) by mouth daily. 30 tablet 0   amphetamine-dextroamphetamine (ADDERALL) 20 MG tablet Take 1 tablet (20 mg total) by mouth daily. 30 tablet 0   [START ON 06/08/2022] amphetamine-dextroamphetamine (ADDERALL) 20 MG tablet Take 1 tablet (20 mg total) by mouth daily. 30 tablet 0   amphetamine-dextroamphetamine (ADDERALL) 30 MG tablet Take 1 tablet by  mouth 2 (two) times daily. 60 tablet 0   amphetamine-dextroamphetamine (ADDERALL) 30 MG tablet Take 1 tablet by mouth daily. 60 tablet 0   amphetamine-dextroamphetamine (ADDERALL) 30 MG tablet Take 1 tablet by mouth 2 (two) times daily. 60 tablet 0   [START ON 05/11/2022] amphetamine-dextroamphetamine (ADDERALL) 30 MG tablet Take 1 tablet by mouth 2 (two) times daily. 60 tablet 0   ARIPiprazole (ABILIFY) 10 MG tablet Take 1 tablet (10 mg  total) by mouth 2 (two) times daily. 60 tablet 5   benzonatate (TESSALON) 100 MG capsule Take 1 capsule (100 mg total) by mouth 3 (three) times daily as needed for cough. 30 capsule 0   busPIRone (BUSPAR) 10 MG tablet Take 1 tablet (10 mg total) by mouth 3 (three) times daily. 90 tablet 5   clonazePAM (KLONOPIN) 1 MG tablet TAKE ONE TABLET BY MOUTH FOUR TIMES A DAY AS NEEDED FOR ANXIETY 120 tablet 2   naproxen (NAPROSYN) 500 MG tablet Take 1 tablet (500 mg total) by mouth 2 (two) times daily. 30 tablet 0   ondansetron (ZOFRAN-ODT) 4 MG disintegrating tablet '4mg'$  ODT q4 hours prn nausea/vomit 12 tablet 0   oxyCODONE-acetaminophen (PERCOCET/ROXICET) 5-325 MG tablet Take 1 tablet by mouth every 6 (six) hours as needed. 20 tablet 0   pantoprazole (PROTONIX) 20 MG tablet Take 1 tablet (20 mg total) by mouth daily. 30 tablet 0   No current facility-administered medications for this visit.    Medication Side Effects: None  Allergies:  Allergies  Allergen Reactions   Hydroxyzine Other (See Comments)    Dizziness, mental fogginess, nausea   Vicodin [Hydrocodone-Acetaminophen] Hives and Itching    Past Medical History:  Diagnosis Date   Anxiety    Chicken pox    Headache    due to CSF leak   Heart murmur    "as a child"   Spinal stenosis     Past Medical History, Surgical history, Social history, and Family history were reviewed and updated as appropriate.   Please see review of systems for further details on the patient's review from today.   Objective:   Physical Exam:  There were no vitals taken for this visit.  Physical Exam Constitutional:      General: He is not in acute distress. Musculoskeletal:        General: No deformity.  Neurological:     Mental Status: He is alert and oriented to person, place, and time.     Coordination: Coordination normal.  Psychiatric:        Attention and Perception: Attention and perception normal. He does not perceive auditory or visual  hallucinations.        Mood and Affect: Mood normal. Mood is not anxious or depressed. Affect is not labile, blunt, angry or inappropriate.        Speech: Speech normal.        Behavior: Behavior normal.        Thought Content: Thought content normal. Thought content is not paranoid or delusional. Thought content does not include homicidal or suicidal ideation. Thought content does not include homicidal or suicidal plan.        Cognition and Memory: Cognition and memory normal.        Judgment: Judgment normal.     Comments: Insight intact     Lab Review:     Component Value Date/Time   NA 138 11/23/2021 0740   K 4.1 11/23/2021 0740   CL 104 11/23/2021 0740   CO2  27 11/23/2021 0740   GLUCOSE 113 (H) 11/23/2021 0740   BUN 14 11/23/2021 0740   CREATININE 1.00 11/23/2021 0740   CALCIUM 8.6 (L) 11/23/2021 0740   PROT 7.2 11/23/2021 0740   ALBUMIN 4.0 11/23/2021 0740   AST 25 11/23/2021 0740   ALT 24 11/23/2021 0740   ALKPHOS 74 11/23/2021 0740   BILITOT 0.6 11/23/2021 0740   GFRNONAA >60 11/23/2021 0740   GFRAA >60 06/23/2020 0620       Component Value Date/Time   WBC 7.2 11/23/2021 0740   RBC 4.93 11/23/2021 0740   HGB 15.4 11/23/2021 0740   HCT 46.5 11/23/2021 0740   PLT 205 11/23/2021 0740   MCV 94.3 11/23/2021 0740   MCH 31.2 11/23/2021 0740   MCHC 33.1 11/23/2021 0740   RDW 13.2 11/23/2021 0740   LYMPHSABS 0.7 11/23/2021 0740   MONOABS 0.5 11/23/2021 0740   EOSABS 0.3 11/23/2021 0740   BASOSABS 0.0 11/23/2021 0740    No results found for: "POCLITH", "LITHIUM"   No results found for: "PHENYTOIN", "PHENOBARB", "VALPROATE", "CBMZ"   .res Assessment: Plan:    Plan:  Abilify '20mg'$  daily to target mood instability.  Buspar '10mg'$  TID  Klonopin '1mg'$  4 times daily. Adderall '30mg'$  BID  Adderall '20mg'$  in the afternoon for work - evening shift Add Wellbutrin XL '150mg'$  daily x 7 days, then increase to '300mg'$  daily - denies seizure history.  ADHD outside testing - tested  14 - adult ADHD in office today. Consider referral for psychological testing.  RTC 4 works  Out of work from 04/19/2022 through 04/22/2022.  Monitor BP between visits  Discussed potential metabolic side effects associated with atypical antipsychotics, as well as potential risk for movement side effects. Advised pt to contact office if movement side effects occur.   Discussed potential benefits, risks, and side effects of stimulants with patient to include increased heart rate, palpitations, insomnia, increased anxiety, increased irritability, or decreased appetite.  Instructed patient to contact office if experiencing any significant tolerability issues.  Discussed potential benefits, risk, and side effects of benzodiazepines to include potential risk of tolerance and dependence, as well as possible drowsiness. Advised patient not to drive if experiencing drowsiness and to take lowest possible effective dose to minimize risk of dependence and tolerance.  Diagnoses and all orders for this visit:  Major depressive disorder, recurrent episode, moderate (HCC) -     buPROPion (WELLBUTRIN XL) 150 MG 24 hr tablet; Take one tablet every morning for 7 days, then increase to two tablets every morning.  Attention deficit hyperactivity disorder (ADHD), combined type  Generalized anxiety disorder  Insomnia, unspecified type  Bipolar I disorder ([place])     Please see After Visit Summary for patient specific instructions.  Future Appointments  Date Time Provider [place]  06/11/2022  9:40 AM Eliya Geiman, Berdie Ogren, NP [facility] None    No orders of the defined types were placed in this encounter.   -------------------------------

## 2022-05-21 ENCOUNTER — Ambulatory Visit (INDEPENDENT_AMBULATORY_CARE_PROVIDER_SITE_OTHER): Payer: Self-pay | Admitting: Adult Health

## 2022-05-21 ENCOUNTER — Encounter: Payer: Self-pay | Admitting: Adult Health

## 2022-05-21 DIAGNOSIS — F331 Major depressive disorder, recurrent, moderate: Secondary | ICD-10-CM

## 2022-05-21 DIAGNOSIS — F909 Attention-deficit hyperactivity disorder, unspecified type: Secondary | ICD-10-CM

## 2022-05-21 DIAGNOSIS — F411 Generalized anxiety disorder: Secondary | ICD-10-CM

## 2022-05-21 DIAGNOSIS — F39 Unspecified mood [affective] disorder: Secondary | ICD-10-CM

## 2022-05-21 MED ORDER — ARIPIPRAZOLE 10 MG PO TABS: 10.0000 mg | ORAL_TABLET | Freq: Two times a day (BID) | ORAL | 5 refills | Status: DC

## 2022-05-21 MED ORDER — AMPHETAMINE-DEXTROAMPHETAMINE 20 MG PO TABS: 20.0000 mg | ORAL_TABLET | Freq: Every day | ORAL | 0 refills | Status: DC

## 2022-05-21 MED ORDER — CLONAZEPAM 1 MG PO TABS: ORAL_TABLET | ORAL | 2 refills | Status: DC

## 2022-05-21 MED ORDER — AMPHETAMINE-DEXTROAMPHETAMINE 30 MG PO TABS: 30.0000 mg | ORAL_TABLET | Freq: Two times a day (BID) | ORAL | 0 refills | Status: DC

## 2022-05-21 MED ORDER — BUPROPION HCL ER (XL) 150 MG PO TB24: ORAL_TABLET | ORAL | 5 refills | Status: DC

## 2022-05-21 MED ORDER — AMPHETAMINE-DEXTROAMPHETAMINE 30 MG PO TABS: 30.0000 mg | ORAL_TABLET | Freq: Every day | ORAL | 0 refills | Status: DC

## 2022-05-21 MED ORDER — BUSPIRONE HCL 10 MG PO TABS: 10.0000 mg | ORAL_TABLET | Freq: Three times a day (TID) | ORAL | 5 refills | Status: DC

## 2022-05-21 NOTE — Progress Notes (Signed)
ESCHER Montgomery [id number] [birth year] 36 y.o.  Subjective:   Patient ID:  Jay Montgomery is a 36 y.o. (DOB 1985-12-25) male.  Chief Complaint: No chief complaint on file.   HPI Jay Montgomery presents to the office today for follow-up of GAD, MDD, insomnia, ADHD, and BPD 1.  Describes mood today as "ok". Pleasant. Mood symptoms - reports decreased depression, anxiety and irritability. Reports decreased panic attacks. Reports mood has improved. Stating "I'm doing better". Feels like medication changes have been helpful. Has started a new job - "going well". Family doing well. Stable interest and motivation. Taking medications as prescribed. Energy levels improved. Active, does not have a regular exercise routine. Enjoys some usual interests. Lives with family - fiance and 4 children. Family local and supportive.  Appetite increased. Weight gain - 233 pounds - 75". Sleeps well most nights. Averages 6 to 8 hours. Focus and concentration improved. Completing tasks. Manages aspects of household. Working full time. Denies SI or HI.  Denies AH or VH. Denies self harm.  Denies substance use.  Medication trials: Xanax, Celexa, Valium   GAD-7    Flowsheet Row Office Visit from 02/15/2019 in [facility] Office Visit from 01/31/2019 in [facility] Office Visit from 01/25/2019 in [facility]  Total GAD-7 Score '3 11 18      '$ PHQ2-9    [facility] Visit from 04/29/2020 in [facility] Office Visit from 01/25/2019 in [facility]  PHQ-2 Total Score 0 0  PHQ-9 Total Score 1 --      [place] [facility] from 11/23/2021 in [facility] from 07/22/2021 in [facility] from 11/21/2020 in [facility] [place] No Risk  No Risk No Risk        Review of Systems:  Review of Systems  Musculoskeletal:  Negative for gait problem.  Neurological:  Negative for tremors.  Psychiatric/Behavioral:         Please refer to HPI    Medications: I have reviewed the patient's current medications.  Current Outpatient Medications  Medication Sig Dispense Refill   albuterol (VENTOLIN HFA) 108 (90 Base) MCG/ACT inhaler Inhale 2 puffs into the lungs every 6 (six) hours as needed for wheezing or shortness of breath. 8 g 0   amphetamine-dextroamphetamine (ADDERALL) 20 MG tablet Take 1 tablet (20 mg total) by mouth daily. 30 tablet 0   [START ON 06/18/2022] amphetamine-dextroamphetamine (ADDERALL) 20 MG tablet Take 1 tablet (20 mg total) by mouth daily. 30 tablet 0   [START ON 07/16/2022] amphetamine-dextroamphetamine (ADDERALL) 20 MG tablet Take 1 tablet (20 mg total) by mouth daily. 30 tablet 0   amphetamine-dextroamphetamine (ADDERALL) 30 MG tablet Take 1 tablet by mouth 2 (two) times daily. 60 tablet 0   [START ON 06/18/2022] amphetamine-dextroamphetamine (ADDERALL) 30 MG tablet Take 1 tablet by mouth 2 (two) times daily. 60 tablet 0   [START ON 07/16/2022] amphetamine-dextroamphetamine (ADDERALL) 30 MG tablet Take 1 tablet by mouth daily. 60 tablet 0   ARIPiprazole (ABILIFY) 10 MG tablet Take 1 tablet (10 mg total) by mouth 2 (two) times daily. 60 tablet 5   benzonatate (TESSALON) 100 MG capsule Take 1 capsule (100 mg total) by mouth 3 (three) times daily as needed for cough. 30 capsule 0   buPROPion (WELLBUTRIN XL) 150 MG 24 hr tablet Take one tablet every morning for 7  days, then increase to two tablets every morning. 60 tablet 5   busPIRone (BUSPAR) 10 MG tablet Take 1 tablet (10 mg total) by mouth 3 (three) times daily. 90 tablet 5   clonazePAM (KLONOPIN) 1 MG tablet TAKE ONE TABLET BY MOUTH FOUR TIMES A DAY AS NEEDED FOR ANXIETY 120 tablet 2   naproxen (NAPROSYN) 500 MG tablet Take 1 tablet (500 mg total) by mouth 2  (two) times daily. 30 tablet 0   ondansetron (ZOFRAN-ODT) 4 MG disintegrating tablet '4mg'$  ODT q4 hours prn nausea/vomit 12 tablet 0   oxyCODONE-acetaminophen (PERCOCET/ROXICET) 5-325 MG tablet Take 1 tablet by mouth every 6 (six) hours as needed. 20 tablet 0   pantoprazole (PROTONIX) 20 MG tablet Take 1 tablet (20 mg total) by mouth daily. 30 tablet 0   No current facility-administered medications for this visit.    Medication Side Effects: None  Allergies:  Allergies  Allergen Reactions   Hydroxyzine Other (See Comments)    Dizziness, mental fogginess, nausea   Vicodin [Hydrocodone-Acetaminophen] Hives and Itching    Past Medical History:  Diagnosis Date   Anxiety    Chicken pox    Headache    due to CSF leak   Heart murmur    "as a child"   Spinal stenosis     Past Medical History, Surgical history, Social history, and Family history were reviewed and updated as appropriate.   Please see review of systems for further details on the patient's review from today.   Objective:   Physical Exam:  There were no vitals taken for this visit.  Physical Exam Constitutional:      General: He is not in acute distress. Musculoskeletal:        General: No deformity.  Neurological:     Mental Status: He is alert and oriented to person, place, and time.     Coordination: Coordination normal.  Psychiatric:        Attention and Perception: Attention and perception normal. He does not perceive auditory or visual hallucinations.        Mood and Affect: Mood normal. Mood is not anxious or depressed. Affect is not labile, blunt, angry or inappropriate.        Speech: Speech normal.        Behavior: Behavior normal.        Thought Content: Thought content normal. Thought content is not paranoid or delusional. Thought content does not include homicidal or suicidal ideation. Thought content does not include homicidal or suicidal plan.        Cognition and Memory: Cognition and memory  normal.        Judgment: Judgment normal.     Comments: Insight intact     Lab Review:     Component Value Date/Time   NA 138 11/23/2021 0740   K 4.1 11/23/2021 0740   CL 104 11/23/2021 0740   CO2 27 11/23/2021 0740   GLUCOSE 113 (H) 11/23/2021 0740   BUN 14 11/23/2021 0740   CREATININE 1.00 11/23/2021 0740   CALCIUM 8.6 (L) 11/23/2021 0740   PROT 7.2 11/23/2021 0740   ALBUMIN 4.0 11/23/2021 0740   AST 25 11/23/2021 0740   ALT 24 11/23/2021 0740   ALKPHOS 74 11/23/2021 0740   BILITOT 0.6 11/23/2021 0740   GFRNONAA >60 11/23/2021 0740   GFRAA >60 06/23/2020 0620       Component Value Date/Time   WBC 7.2 11/23/2021 0740   RBC 4.93 11/23/2021 0740   HGB 15.4  11/23/2021 0740   HCT 46.5 11/23/2021 0740   PLT 205 11/23/2021 0740   MCV 94.3 11/23/2021 0740   MCH 31.2 11/23/2021 0740   MCHC 33.1 11/23/2021 0740   RDW 13.2 11/23/2021 0740   LYMPHSABS 0.7 11/23/2021 0740   MONOABS 0.5 11/23/2021 0740   EOSABS 0.3 11/23/2021 0740   BASOSABS 0.0 11/23/2021 0740    No results found for: "POCLITH", "LITHIUM"   No results found for: "PHENYTOIN", "PHENOBARB", "VALPROATE", "CBMZ"   .res Assessment: Plan:   Plan:  Abilify '20mg'$  daily to target mood instability.  Buspar '10mg'$  TID  Klonopin '1mg'$  4 times daily. Adderall '30mg'$  BID  Adderall '20mg'$  in the afternoon for work - evening shift Wellbutrin XL '300mg'$  daily - denies seizure history.  ADHD outside testing - tested 41 - adult ADHD in office today. Consider referral for psychological testing.  RTC 4 works  Monitor BP between visits  Discussed potential metabolic side effects associated with atypical antipsychotics, as well as potential risk for movement side effects. Advised pt to contact office if movement side effects occur.   Discussed potential benefits, risks, and side effects of stimulants with patient to include increased heart rate, palpitations, insomnia, increased anxiety, increased irritability, or decreased  appetite.  Instructed patient to contact office if experiencing any significant tolerability issues.  Discussed potential benefits, risk, and side effects of benzodiazepines to include potential risk of tolerance and dependence, as well as possible drowsiness. Advised patient not to drive if experiencing drowsiness and to take lowest possible effective dose to minimize risk of dependence and tolerance. Diagnoses and all orders for this visit:  Generalized anxiety disorder -     clonazePAM (KLONOPIN) 1 MG tablet; TAKE ONE TABLET BY MOUTH FOUR TIMES A DAY AS NEEDED FOR ANXIETY -     busPIRone (BUSPAR) 10 MG tablet; Take 1 tablet (10 mg total) by mouth 3 (three) times daily. -     ARIPiprazole (ABILIFY) 10 MG tablet; Take 1 tablet (10 mg total) by mouth 2 (two) times daily.  Major depressive disorder, recurrent episode, moderate (HCC) -     buPROPion (WELLBUTRIN XL) 150 MG 24 hr tablet; Take one tablet every morning for 7 days, then increase to two tablets every morning. -     ARIPiprazole (ABILIFY) 10 MG tablet; Take 1 tablet (10 mg total) by mouth 2 (two) times daily.  Mood disorder (HCC) -     ARIPiprazole (ABILIFY) 10 MG tablet; Take 1 tablet (10 mg total) by mouth 2 (two) times daily.  Attention deficit hyperactivity disorder (ADHD), unspecified ADHD type -     amphetamine-dextroamphetamine (ADDERALL) 30 MG tablet; Take 1 tablet by mouth 2 (two) times daily. -     amphetamine-dextroamphetamine (ADDERALL) 30 MG tablet; Take 1 tablet by mouth 2 (two) times daily. -     amphetamine-dextroamphetamine (ADDERALL) 30 MG tablet; Take 1 tablet by mouth daily. -     amphetamine-dextroamphetamine (ADDERALL) 20 MG tablet; Take 1 tablet (20 mg total) by mouth daily. -     amphetamine-dextroamphetamine (ADDERALL) 20 MG tablet; Take 1 tablet (20 mg total) by mouth daily. -     amphetamine-dextroamphetamine (ADDERALL) 20 MG tablet; Take 1 tablet (20 mg total) by mouth daily.     Please see After Visit  Summary for patient specific instructions.  No future appointments.  No orders of the defined types were placed in this encounter.   -------------------------------

## 2022-06-11 ENCOUNTER — Ambulatory Visit: Payer: Self-pay | Admitting: Adult Health

## 2022-07-12 ENCOUNTER — Other Ambulatory Visit: Payer: Self-pay

## 2022-07-12 ENCOUNTER — Emergency Department (HOSPITAL_COMMUNITY)
Admission: EM | Admit: 2022-07-12 | Discharge: 2022-07-12 | Disposition: A | Discharge status: Home / Self Care | Payer: No Typology Code available for payment source | Attending: Emergency Medicine | Admitting: Emergency Medicine

## 2022-07-12 ENCOUNTER — Encounter (HOSPITAL_COMMUNITY): Payer: Self-pay | Admitting: Emergency Medicine

## 2022-07-12 DIAGNOSIS — M542 Cervicalgia: Secondary | ICD-10-CM | POA: Diagnosis present

## 2022-07-12 DIAGNOSIS — M25512 Pain in left shoulder: Secondary | ICD-10-CM | POA: Insufficient documentation

## 2022-07-12 DIAGNOSIS — M436 Torticollis: Secondary | ICD-10-CM | POA: Diagnosis not present

## 2022-07-12 MED ORDER — IBUPROFEN 600 MG PO TABS: 600.0000 mg | ORAL_TABLET | Freq: Three times a day (TID) | ORAL | 0 refills | Status: DC

## 2022-07-12 MED ORDER — CYCLOBENZAPRINE HCL 5 MG PO TABS: 5.0000 mg | ORAL_TABLET | Freq: Three times a day (TID) | ORAL | 0 refills | Status: DC

## 2022-07-12 NOTE — Discharge Instructions (Signed)
As discussed, applying ice throughout the day is much as is comfortable will be helpful for the next several days to help reduce deep tissue inflammation.  However also recommend applying heat to the site for 15 to 20 minutes 2-3 times daily followed by range of motion stretches as we discussed.  Use the medications prescribed.  Follow-up with your primary doctor if your symptoms are not improving over the next 3 to 5 days.

## 2022-07-12 NOTE — ED Triage Notes (Signed)
Pt reports left shoulder and neck pain. Pt reports the pain is worse with movement. Denies trauma to the area.

## 2022-07-12 NOTE — ED Provider Notes (Signed)
[facility] EMERGENCY DEPARTMENT Provider Note   CSN: [id number] Arrival date & time: 07/12/22  0857     History  Chief Complaint  Patient presents with   Shoulder Pain    Jay Montgomery is a 36 y.o. male with a history significant for lumbar spinal stenosis and lumbar laminectomy in 2019 presenting with gradual onset of left neck and muscular pain with decreased cervical range of motion over the past 3 days.  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  He denies injury or falls.  He also denies weakness, numbness or pain in his upper extremities.  Pain is worse with attempts to turn his head to the right, his pain is localizing to the left neck and shoulder muscles.  He has tried a BC powder without improvement in his symptoms.  The history is provided by the patient.       Home Medications Prior to Admission medications   Medication Sig Start Date End Date Taking? Authorizing Provider  cyclobenzaprine (FLEXERIL) 5 MG tablet Take 1 tablet (5 mg total) by mouth 3 (three) times daily. 07/12/22  Yes Yeilin Zweber, Almyra Free, PA-C  ibuprofen (ADVIL) 600 MG tablet Take 1 tablet (600 mg total) by mouth 3 (three) times daily. 07/12/22  Yes Bona Hubbard, Almyra Free, PA-C  albuterol (VENTOLIN HFA) 108 (90 Base) MCG/ACT inhaler Inhale 2 puffs into the lungs every 6 (six) hours as needed for wheezing or shortness of breath. 06/09/21   Brunetta Jeans, PA-C  amphetamine-dextroamphetamine (ADDERALL) 20 MG tablet Take 1 tablet (20 mg total) by mouth daily. 05/21/22   Mozingo, Berdie Ogren, NP  amphetamine-dextroamphetamine (ADDERALL) 20 MG tablet Take 1 tablet (20 mg total) by mouth daily. 06/18/22   Mozingo, Berdie Ogren, NP  amphetamine-dextroamphetamine (ADDERALL) 20 MG tablet Take 1 tablet (20 mg total) by mouth daily. 07/16/22   Mozingo, Berdie Ogren, NP  amphetamine-dextroamphetamine (ADDERALL) 30 MG tablet Take 1 tablet by mouth 2  (two) times daily. 05/21/22   Mozingo, Berdie Ogren, NP  amphetamine-dextroamphetamine (ADDERALL) 30 MG tablet Take 1 tablet by mouth 2 (two) times daily. 06/18/22   Mozingo, Berdie Ogren, NP  amphetamine-dextroamphetamine (ADDERALL) 30 MG tablet Take 1 tablet by mouth daily. 07/16/22   Mozingo, Berdie Ogren, NP  ARIPiprazole (ABILIFY) 10 MG tablet Take 1 tablet (10 mg total) by mouth 2 (two) times daily. 05/21/22   Mozingo, Berdie Ogren, NP  benzonatate (TESSALON) 100 MG capsule Take 1 capsule (100 mg total) by mouth 3 (three) times daily as needed for cough. 06/09/21   Brunetta Jeans, PA-C  buPROPion (WELLBUTRIN XL) 150 MG 24 hr tablet Take one tablet every morning for 7 days, then increase to two tablets every morning. 05/21/22   Mozingo, Berdie Ogren, NP  busPIRone (BUSPAR) 10 MG tablet Take 1 tablet (10 mg total) by mouth 3 (three) times daily. 05/21/22   Mozingo, Berdie Ogren, NP  clonazePAM (KLONOPIN) 1 MG tablet TAKE ONE TABLET BY MOUTH FOUR TIMES A DAY AS NEEDED FOR ANXIETY 05/21/22   Mozingo, Berdie Ogren, NP  ondansetron (ZOFRAN-ODT) 4 MG disintegrating tablet '4mg'$  ODT q4 hours prn nausea/vomit 11/23/21   Milton Ferguson, MD  oxyCODONE-acetaminophen (PERCOCET/ROXICET) 5-325 MG tablet Take 1 tablet by mouth every 6 (six) hours as needed. 06/23/20   Milton Ferguson, MD  pantoprazole (PROTONIX) 20 MG tablet Take 1 tablet (20 mg total) by mouth daily. 11/23/21   Milton Ferguson, MD  Allergies    Hydroxyzine and Vicodin [hydrocodone-acetaminophen]    Review of Systems   Review of Systems  Constitutional:  Negative for chills and fever.  Musculoskeletal:  Positive for neck pain and neck stiffness. Negative for arthralgias, joint swelling and myalgias.  Neurological:  Negative for weakness and numbness.  All other systems reviewed and are negative.   Physical Exam Updated Vital Signs BP (!) 124/92 (BP Location: Left Arm)   Pulse (!) 56   Temp 97.6 F (36.4 C) (Oral)   Resp 17    SpO2 100%  Physical Exam Constitutional:      Appearance: He is well-developed.  HENT:     Head: Atraumatic.  Cardiovascular:     Comments: Pulses equal bilaterally Musculoskeletal:        General: Tenderness present.     Cervical back: Normal range of motion. Torticollis present. No edema. Pain with movement present.       Back:     Comments: Patient has tenderness to palpation along his left SCM and upper trapezius with appreciable muscle spasm present.  He has increased pain with bilateral cervical rotation, rightward rotation is worse than left.  He has equal grip strength, equal DTRs in upper extremities.  Skin:    General: Skin is warm and dry.  Neurological:     Mental Status: He is alert.     Sensory: No sensory deficit.     Motor: No weakness.     Deep Tendon Reflexes: Reflexes normal.     ED Results / Procedures / Treatments   Labs (all labs ordered are listed, but only abnormal results are displayed) Labs Reviewed - No data to display  EKG None  Radiology No results found.  Procedures Procedures    Medications Ordered in ED Medications - No data to display  ED Course/ Medical Decision Making/ A&P                           Medical Decision Making Patient presenting with classic torticollis symptoms.  He has no neurologic deficits, no history of trauma, no indication for imaging.  He is prescribed muscle relaxers and anti-inflammatories.  We discussed role of ice and heat.  Plan follow-up with his primary provider if today's treatment plan is not improving his symptoms.  We also discussed range of motion exercises after he has applied his heat treatment to this site.  Risk Prescription drug management.           Final Clinical Impression(s) / ED Diagnoses Final diagnoses:  Torticollis    Rx / DC Orders ED Discharge Orders          Ordered    ibuprofen (ADVIL) 600 MG tablet  3 times daily        07/12/22 0930    cyclobenzaprine  (FLEXERIL) 5 MG tablet  3 times daily        07/12/22 0930              Evalee Jefferson, PA-C 45/03/88 [id number]    Lianne Cure, DO 03/49/17 1539

## 2022-08-18 IMAGING — DX DG ANKLE COMPLETE 3+V*R*
3 series · 3 of 3 positions shown · non-contrast
Comparison: None.

CLINICAL DATA: Fall, right ankle pain

EXAM:
RIGHT ANKLE - COMPLETE 3+ VIEW

[ankle ap]
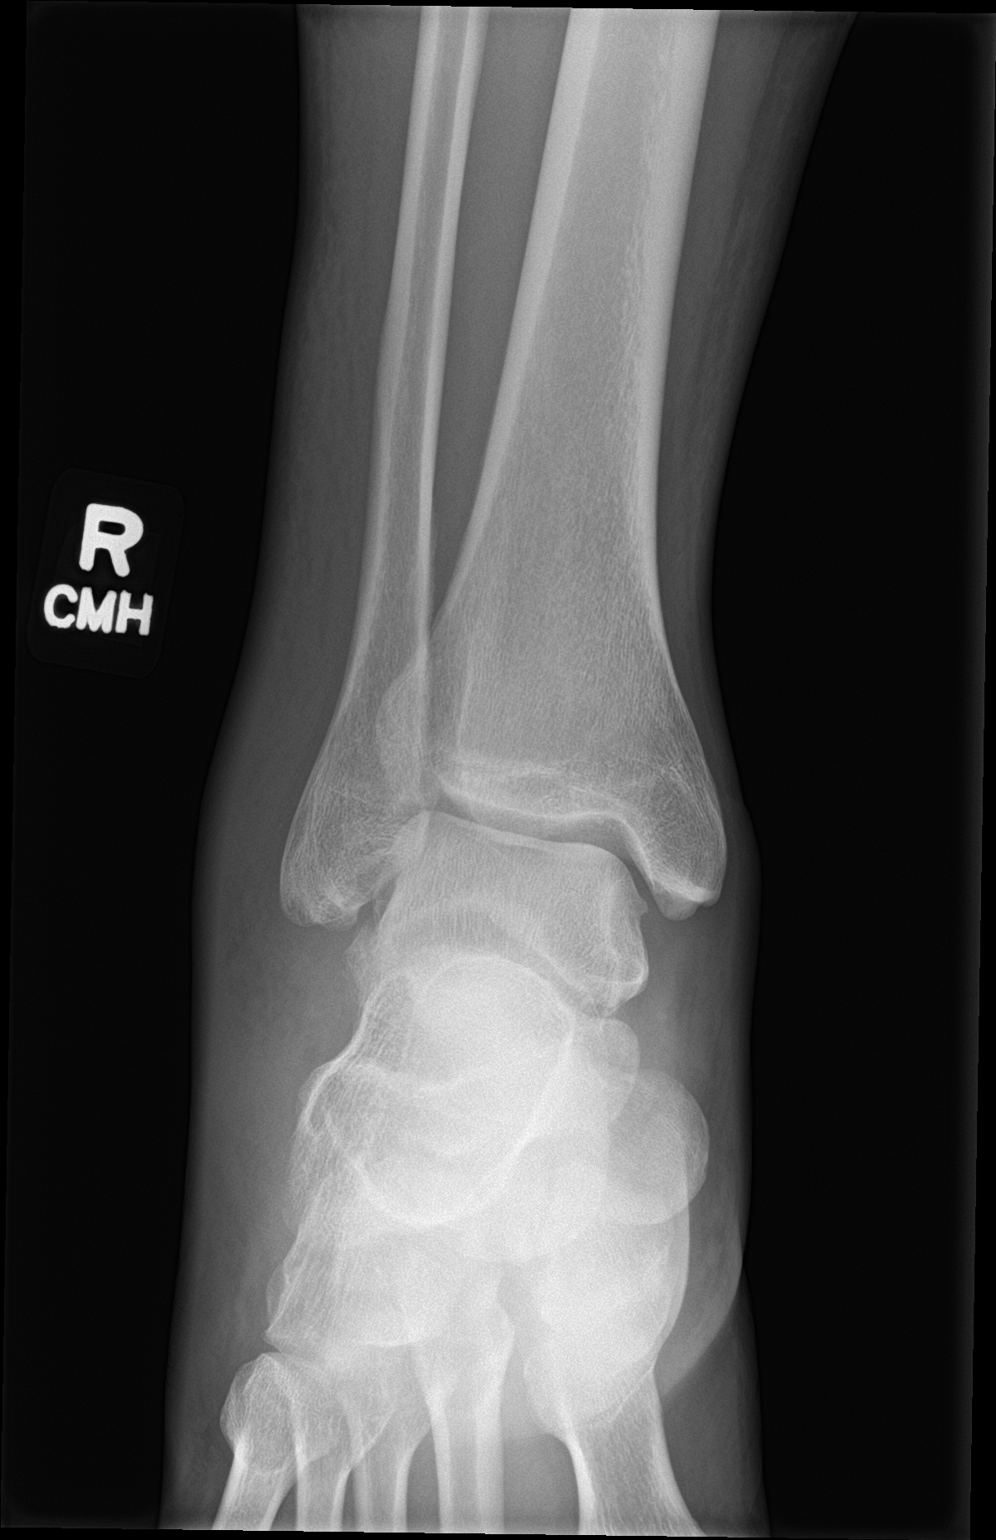

[ankle obl]
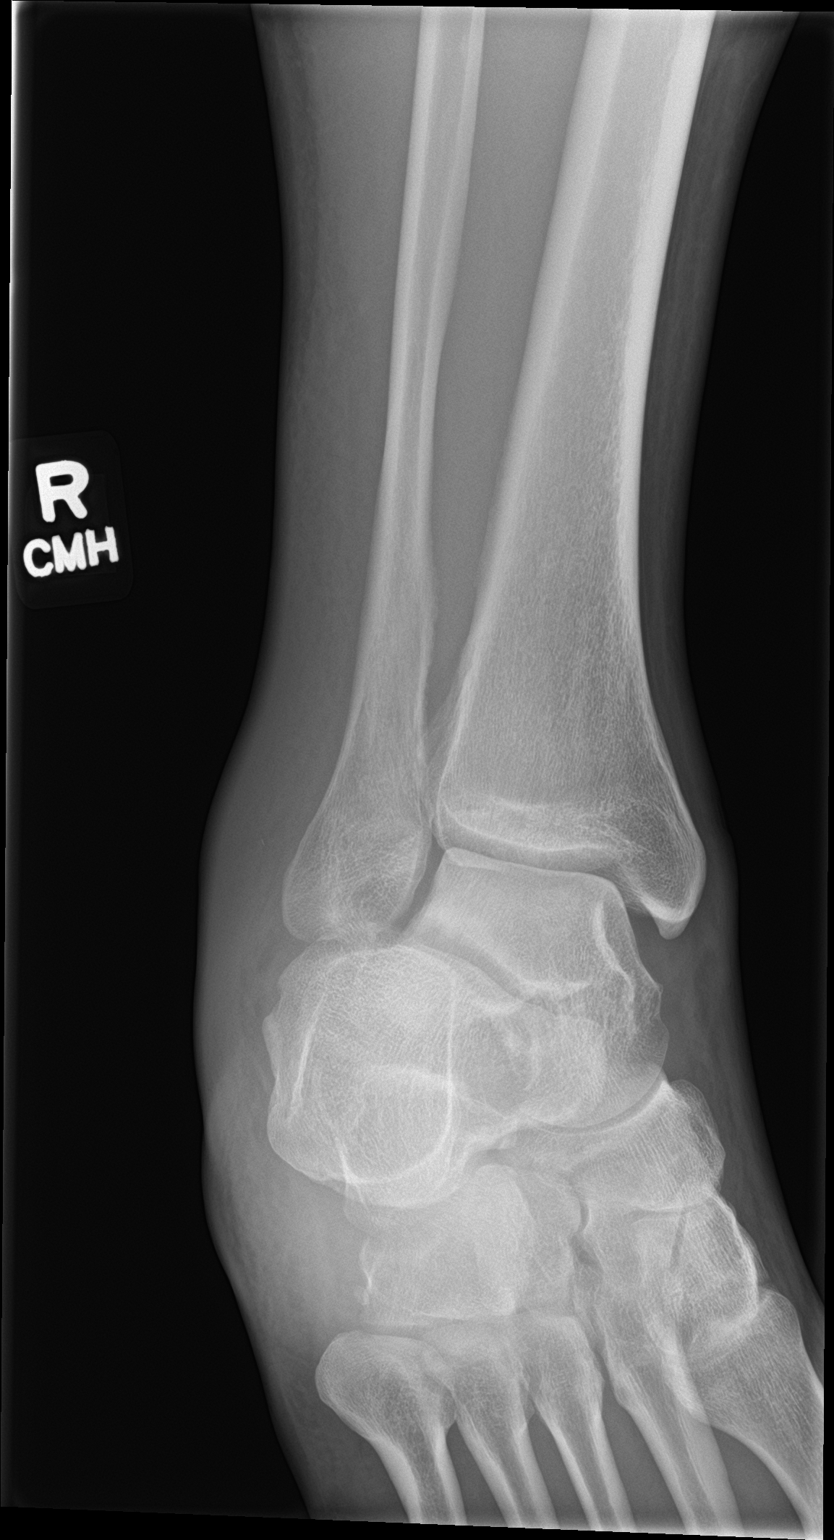

[ankle lat]
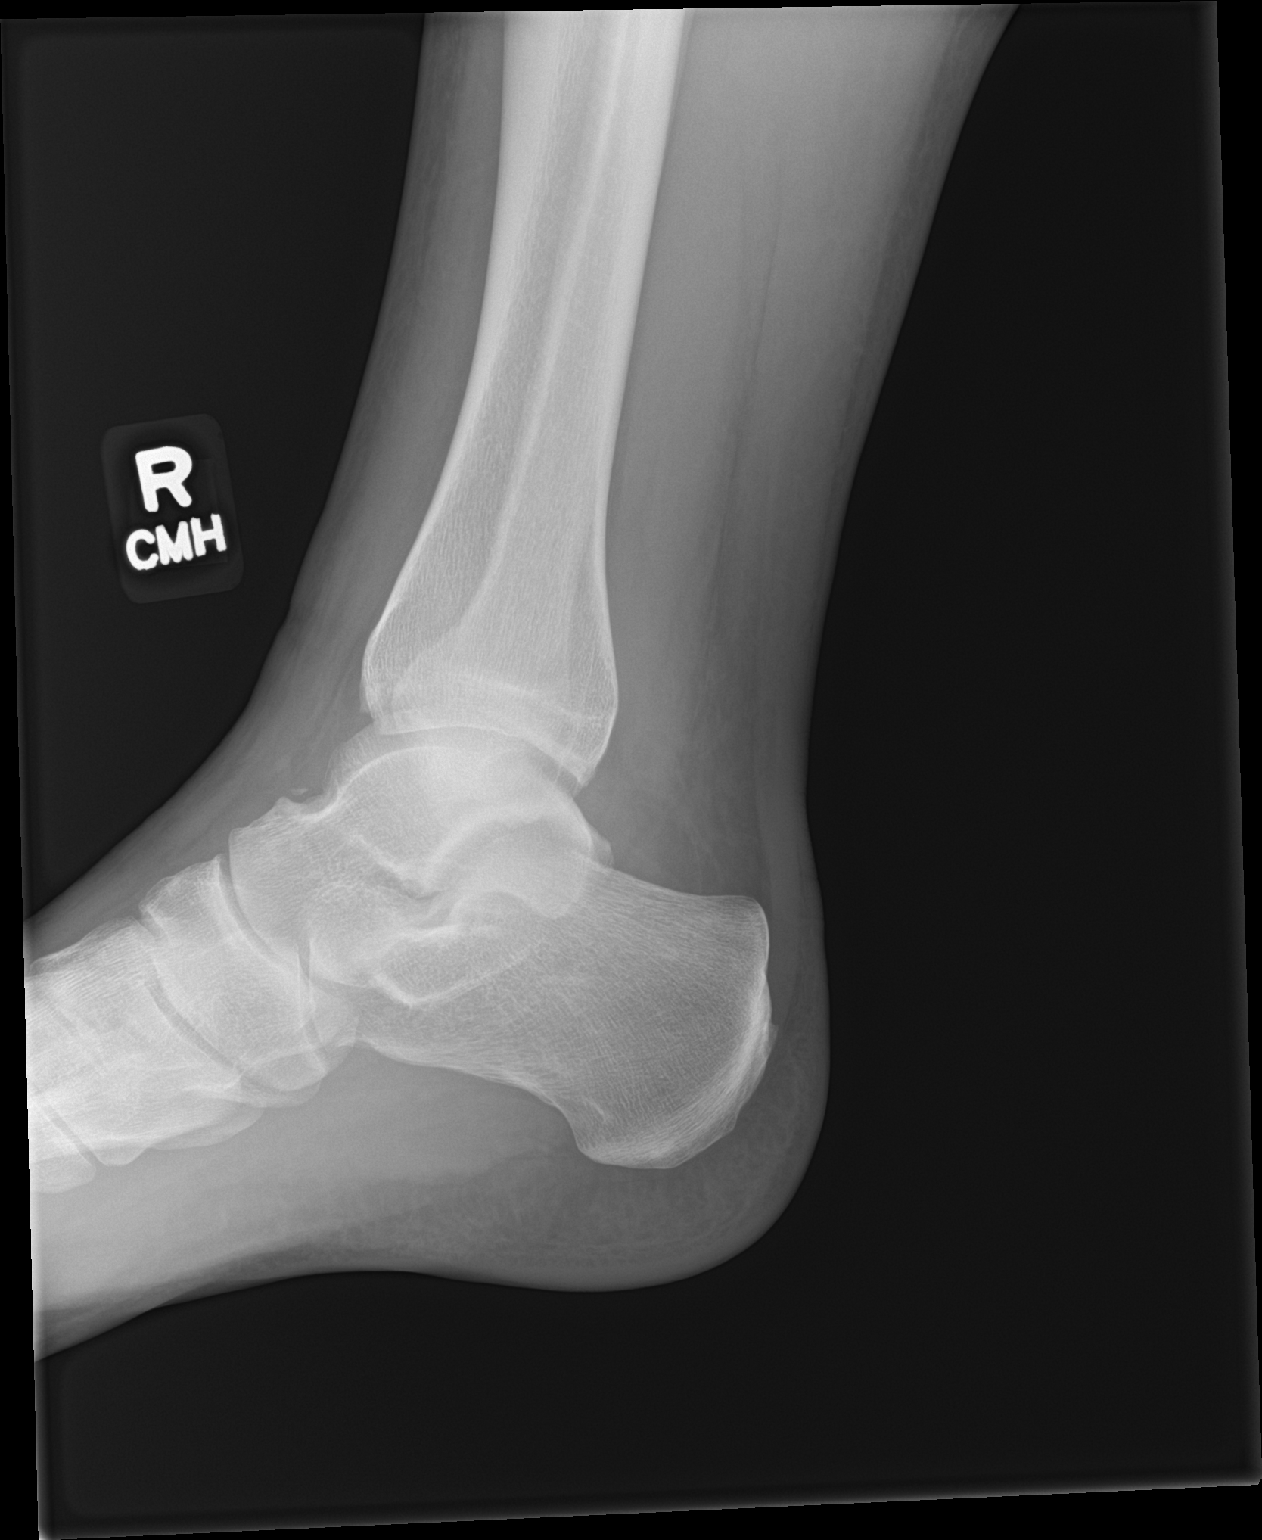

[3 of 3 positions shown; findings below may reference images not displayed]

FINDINGS: No fracture or dislocation is seen.

The ankle mortise is intact.

Moderate lateral soft tissue swelling.
IMPRESSION: Moderate lateral soft tissue swelling.

No fracture or dislocation is seen.

## 2022-08-20 ENCOUNTER — Encounter: Payer: Self-pay | Admitting: Adult Health

## 2022-08-20 ENCOUNTER — Ambulatory Visit (INDEPENDENT_AMBULATORY_CARE_PROVIDER_SITE_OTHER): Payer: No Typology Code available for payment source | Admitting: Adult Health

## 2022-08-20 DIAGNOSIS — F411 Generalized anxiety disorder: Secondary | ICD-10-CM

## 2022-08-20 DIAGNOSIS — F909 Attention-deficit hyperactivity disorder, unspecified type: Secondary | ICD-10-CM

## 2022-08-20 DIAGNOSIS — F319 Bipolar disorder, unspecified: Secondary | ICD-10-CM | POA: Diagnosis not present

## 2022-08-20 DIAGNOSIS — F902 Attention-deficit hyperactivity disorder, combined type: Secondary | ICD-10-CM | POA: Diagnosis not present

## 2022-08-20 DIAGNOSIS — F331 Major depressive disorder, recurrent, moderate: Secondary | ICD-10-CM

## 2022-08-20 DIAGNOSIS — G4700 Insomnia, unspecified: Secondary | ICD-10-CM

## 2022-08-20 MED ORDER — CLONAZEPAM 1 MG PO TABS: ORAL_TABLET | ORAL | 2 refills | Status: DC

## 2022-08-20 MED ORDER — LITHIUM CARBONATE 150 MG PO CAPS: 150.0000 mg | ORAL_CAPSULE | Freq: Every day | ORAL | 2 refills | Status: DC

## 2022-08-20 MED ORDER — AMPHETAMINE-DEXTROAMPHETAMINE 30 MG PO TABS: 30.0000 mg | ORAL_TABLET | Freq: Two times a day (BID) | ORAL | 0 refills | Status: DC

## 2022-08-20 MED ORDER — AMPHETAMINE-DEXTROAMPHETAMINE 20 MG PO TABS: 20.0000 mg | ORAL_TABLET | Freq: Every day | ORAL | 0 refills | Status: DC

## 2022-08-20 NOTE — Progress Notes (Signed)
Jay Montgomery [id number] 01-31-1986 36 y.o.  Subjective:   Patient ID:  Jay Montgomery is a 36 y.o. (DOB 26-Mar-1986) male.  Chief Complaint: No chief complaint on file.   HPI Jay Montgomery presents to the office today for follow-up of GAD, MDD, insomnia, ADHD, and BPD 1.  Describes mood today as "ok". Pleasant. Mood symptoms - reports decreased depression and anxiety. Reports irritability at times. Reports panic attacks - has reduced Clonazepam usage. Reports mood has improved overall. Stating "I'm doing alright". Feels like medications are helpful. Has switched to night shift and is adjusting. Family doing well. Stable interest and motivation. Taking medications as prescribed. Energy levels "alright". Active, does not have a regular exercise routine. Enjoys some usual interests. Lives with family - fiance and 4 children. Family local and supportive.  Appetite adequate. Weight loss - 220 pounds - 75". Sleep is variable. Averages 4 hours since changing to night shift and 10 hours on his days off. Focus and concentration improved. Completing tasks. Manages aspects of household. Working full time - working nights and days. Denies SI or HI. Passive SI without a plan or intention. Denies AH or VH. Denies self harm.  Denies substance use.  Medication trials: Xanax, Celexa, Valium   GAD-7    Flowsheet Row Office Visit from 02/15/2019 in [place] Office Visit from 01/31/2019 in [facility] Office Visit from 01/25/2019 in [place]  Total GAD-7 Score '3 11 18      '$ PHQ2-9    [place] Video Visit from 04/29/2020 in [place] Office Visit from 01/25/2019 in [facility]  PHQ-2 Total Score 0 0  PHQ-9 Total Score 1 --      [place] [facility] from 07/12/2022 in [place] [facility] from 11/23/2021 in [place] [facility] from 07/22/2021 in [place] No Risk No Risk No Risk        Review of Systems:  Review of Systems  Musculoskeletal:  Negative for gait problem.  Neurological:  Negative for tremors.  Psychiatric/Behavioral:         Please refer to HPI    Medications: I have reviewed the patient's current medications.  Current Outpatient Medications  Medication Sig Dispense Refill   lithium carbonate 150 MG capsule Take 1 capsule (150 mg total) by mouth at bedtime. 30 capsule 2   albuterol (VENTOLIN HFA) 108 (90 Base) MCG/ACT inhaler Inhale 2 puffs into the lungs every 6 (six) hours as needed for wheezing or shortness of breath. 8 g 0   amphetamine-dextroamphetamine (ADDERALL) 20 MG tablet Take 1 tablet (20 mg total) by mouth daily. 30 tablet 0   [START ON 09/17/2022] amphetamine-dextroamphetamine (ADDERALL) 20 MG tablet Take 1 tablet (20 mg total) by mouth daily. 30 tablet 0   [START ON 10/15/2022] amphetamine-dextroamphetamine (ADDERALL) 20 MG tablet Take 1 tablet (20 mg total) by mouth daily. 30 tablet 0   amphetamine-dextroamphetamine (ADDERALL) 30 MG tablet Take 1 tablet by mouth 2 (two) times daily. 60 tablet 0   [START ON 09/17/2022] amphetamine-dextroamphetamine (ADDERALL) 30 MG tablet Take 1 tablet by mouth 2 (two) times daily. 60 tablet 0   [START ON 10/15/2022] amphetamine-dextroamphetamine (ADDERALL) 30 MG tablet Take 1 tablet by mouth 2 (two) times daily. 60 tablet 0   ARIPiprazole (ABILIFY) 10 MG tablet Take 1 tablet (10 mg total) by mouth 2 (two) times  daily. 60 tablet 5   benzonatate (TESSALON) 100 MG capsule Take 1 capsule (100 mg total) by mouth 3 (three) times daily as needed for cough. 30 capsule 0   buPROPion (WELLBUTRIN XL) 150 MG 24 hr tablet Take one tablet every morning for 7 days, then increase to two tablets every morning. 60 tablet 5   busPIRone (BUSPAR) 10 MG  tablet Take 1 tablet (10 mg total) by mouth 3 (three) times daily. 90 tablet 5   clonazePAM (KLONOPIN) 1 MG tablet TAKE ONE TABLET BY MOUTH FOUR TIMES A DAY AS NEEDED FOR ANXIETY 120 tablet 2   cyclobenzaprine (FLEXERIL) 5 MG tablet Take 1 tablet (5 mg total) by mouth 3 (three) times daily. 21 tablet 0   ibuprofen (ADVIL) 600 MG tablet Take 1 tablet (600 mg total) by mouth 3 (three) times daily. 21 tablet 0   ondansetron (ZOFRAN-ODT) 4 MG disintegrating tablet '4mg'$  ODT q4 hours prn nausea/vomit 12 tablet 0   oxyCODONE-acetaminophen (PERCOCET/ROXICET) 5-325 MG tablet Take 1 tablet by mouth every 6 (six) hours as needed. 20 tablet 0   pantoprazole (PROTONIX) 20 MG tablet Take 1 tablet (20 mg total) by mouth daily. 30 tablet 0   No current facility-administered medications for this visit.    Medication Side Effects: None  Allergies:  Allergies  Allergen Reactions   Hydroxyzine Other (See Comments)    Dizziness, mental fogginess, nausea   Hydrocodone-Acetaminophen Hives, Itching and Rash    Past Medical History:  Diagnosis Date   Anxiety    Chicken pox    Headache    due to CSF leak   Heart murmur    "as a child"   Spinal stenosis     Past Medical History, Surgical history, Social history, and Family history were reviewed and updated as appropriate.   Please see review of systems for further details on the patient's review from today.   Objective:   Physical Exam:  There were no vitals taken for this visit.  Physical Exam Constitutional:      General: He is not in acute distress. Musculoskeletal:        General: No deformity.  Neurological:     Mental Status: He is alert and oriented to person, place, and time.     Coordination: Coordination normal.  Psychiatric:        Attention and Perception: Attention and perception normal. He does not perceive auditory or visual hallucinations.        Mood and Affect: Mood normal. Mood is not anxious or depressed. Affect is not  labile, blunt, angry or inappropriate.        Speech: Speech normal.        Behavior: Behavior normal.        Thought Content: Thought content normal. Thought content is not paranoid or delusional. Thought content does not include homicidal or suicidal ideation. Thought content does not include homicidal or suicidal plan.        Cognition and Memory: Cognition and memory normal.        Judgment: Judgment normal.     Comments: Insight intact     Lab Review:     Component Value Date/Time   NA 138 11/23/2021 0740   K 4.1 11/23/2021 0740   CL 104 11/23/2021 0740   CO2 27 11/23/2021 0740   GLUCOSE 113 (H) 11/23/2021 0740   BUN 14 11/23/2021 0740   CREATININE 1.00 11/23/2021 0740   CALCIUM 8.6 (L) 11/23/2021 0740   PROT 7.2  11/23/2021 0740   ALBUMIN 4.0 11/23/2021 0740   AST 25 11/23/2021 0740   ALT 24 11/23/2021 0740   ALKPHOS 74 11/23/2021 0740   BILITOT 0.6 11/23/2021 0740   GFRNONAA >60 11/23/2021 0740   GFRAA >60 06/23/2020 0620       Component Value Date/Time   WBC 7.2 11/23/2021 0740   RBC 4.93 11/23/2021 0740   HGB 15.4 11/23/2021 0740   HCT 46.5 11/23/2021 0740   PLT 205 11/23/2021 0740   MCV 94.3 11/23/2021 0740   MCH 31.2 11/23/2021 0740   MCHC 33.1 11/23/2021 0740   RDW 13.2 11/23/2021 0740   LYMPHSABS 0.7 11/23/2021 0740   MONOABS 0.5 11/23/2021 0740   EOSABS 0.3 11/23/2021 0740   BASOSABS 0.0 11/23/2021 0740    No results found for: "POCLITH", "LITHIUM"   No results found for: "PHENYTOIN", "PHENOBARB", "VALPROATE", "CBMZ"   .res Assessment: Plan:    Plan:  Abilify '20mg'$  daily to target mood instability.  Wellbutrin XL '300mg'$  daily - denies seizure history. Buspar '10mg'$  TID   Add Lithium '150mg'$  at bedtime - to target passive SI.  Klonopin '1mg'$  4 times daily - typically taking twice daily for anxiety attacks - last filled 6 weeks ago.  Adderall '30mg'$  BID  Adderall '20mg'$  in the afternoon for work - evening shift  ADHD outside testing - tested 23 -  adult ADHD in office today. Consider referral for psychological testing.  RTC 4 weeks  Discussed service animal  Monitor BP between visits  Discussed potential metabolic side effects associated with atypical antipsychotics, as well as potential risk for movement side effects. Advised pt to contact office if movement side effects occur.   Discussed potential benefits, risks, and side effects of stimulants with patient to include increased heart rate, palpitations, insomnia, increased anxiety, increased irritability, or decreased appetite.  Instructed patient to contact office if experiencing any significant tolerability issues.  Discussed potential benefits, risk, and side effects of benzodiazepines to include potential risk of tolerance and dependence, as well as possible drowsiness. Advised patient not to drive if experiencing drowsiness and to take lowest possible effective dose to minimize risk of dependence and tolerance.    Diagnoses and all orders for this visit:  Bipolar I disorder ([place]) -     lithium carbonate 150 MG capsule; Take 1 capsule (150 mg total) by mouth at bedtime.  Generalized anxiety disorder -     clonazePAM (KLONOPIN) 1 MG tablet; TAKE ONE TABLET BY MOUTH FOUR TIMES A DAY AS NEEDED FOR ANXIETY  Major depressive disorder, recurrent episode, moderate (HCC)  Attention deficit hyperactivity disorder (ADHD), combined type  Insomnia, unspecified type  Attention deficit hyperactivity disorder (ADHD), unspecified ADHD type -     amphetamine-dextroamphetamine (ADDERALL) 30 MG tablet; Take 1 tablet by mouth 2 (two) times daily. -     amphetamine-dextroamphetamine (ADDERALL) 30 MG tablet; Take 1 tablet by mouth 2 (two) times daily. -     amphetamine-dextroamphetamine (ADDERALL) 30 MG tablet; Take 1 tablet by mouth 2 (two) times daily. -     amphetamine-dextroamphetamine (ADDERALL) 20 MG tablet; Take 1 tablet (20 mg total) by mouth daily. -      amphetamine-dextroamphetamine (ADDERALL) 20 MG tablet; Take 1 tablet (20 mg total) by mouth daily. -     amphetamine-dextroamphetamine (ADDERALL) 20 MG tablet; Take 1 tablet (20 mg total) by mouth daily.     Please see After Visit Summary for patient specific instructions.  No future appointments.   No orders of the  defined types were placed in this encounter.   -------------------------------

## 2022-09-17 ENCOUNTER — Ambulatory Visit (INDEPENDENT_AMBULATORY_CARE_PROVIDER_SITE_OTHER): Payer: Self-pay | Admitting: Adult Health

## 2022-10-15 ENCOUNTER — Ambulatory Visit (INDEPENDENT_AMBULATORY_CARE_PROVIDER_SITE_OTHER): Payer: 59 | Admitting: Adult Health

## 2022-10-15 ENCOUNTER — Encounter: Payer: Self-pay | Admitting: Adult Health

## 2022-10-15 DIAGNOSIS — Z79899 Other long term (current) drug therapy: Secondary | ICD-10-CM | POA: Diagnosis not present

## 2022-10-15 DIAGNOSIS — G4700 Insomnia, unspecified: Secondary | ICD-10-CM | POA: Diagnosis not present

## 2022-10-15 DIAGNOSIS — F319 Bipolar disorder, unspecified: Secondary | ICD-10-CM | POA: Diagnosis not present

## 2022-10-15 DIAGNOSIS — F902 Attention-deficit hyperactivity disorder, combined type: Secondary | ICD-10-CM

## 2022-10-15 DIAGNOSIS — F411 Generalized anxiety disorder: Secondary | ICD-10-CM

## 2022-10-15 NOTE — Progress Notes (Signed)
Jay Montgomery [id number] [birth year] 37 y.o.  Subjective:   Patient ID:  Jay Montgomery is a 37 y.o. (DOB [birth year]) male.  Chief Complaint: No chief complaint on file.   HPI Jay Montgomery presents to the office today for follow-up of GAD, MDD, insomnia, ADHD, and BPD 1.  Describes mood today as "ok". Pleasant. Mood symptoms - denies depression, anxiety, and irritability.   Reports panic attacks - loud - lots of people - "sometimes" . Mood is stable - "more consistent". Stating "I'm doing much better". Feels like the addition of Lithiuml has been helpful. Family doing well. Stable interest and motivation. Taking medications as prescribed. Energy levels good. Active, does not have a regular exercise routine. Enjoys some usual interests. Lives with family - fiance and 4 children. Family local and supportive. Racing cars. Appetite adequate. Weight loss - 220 pounds - 75". Sleep is consistent. Averages 8 hours. Focus and concentration stable. Completing tasks. Manages aspects of household. Working full time. Denies SI or HI. Denies AH or VH. Denies self harm.  Denies substance use.  Medication trials: Xanax, Celexa, Valium   GAD-7    Flowsheet Row Office Visit from 02/15/2019 in [place] at [facility] Visit from 01/31/2019 in [place] at [facility] Visit from 01/25/2019 in [place] at [facility]  Total GAD-7 Score '3 11 18      '$ PHQ2-9    Flowsheet Row Video Visit from 04/29/2020 in [place] at [facility] Visit from 01/25/2019 in [place] at [facility] Total Score 0 0  PHQ-9 Total Score 1 --      [place] [facility] from 07/12/2022 in [facility] Emergency Department at [facility] ED from 11/23/2021 in [facility] Emergency Department at [facility] ED from 07/22/2021 in [facility]  Emergency Department at [place] No Risk No Risk No Risk        Review of Systems:  Review of Systems  Musculoskeletal:  Negative for gait problem.  Neurological:  Negative for tremors.  Psychiatric/Behavioral:         Please refer to HPI    Medications: I have reviewed the patient's current medications.  Current Outpatient Medications  Medication Sig Dispense Refill   albuterol (VENTOLIN HFA) 108 (90 Base) MCG/ACT inhaler Inhale 2 puffs into the lungs every 6 (six) hours as needed for wheezing or shortness of breath. 8 g 0   amphetamine-dextroamphetamine (ADDERALL) 20 MG tablet Take 1 tablet (20 mg total) by mouth daily. 30 tablet 0   amphetamine-dextroamphetamine (ADDERALL) 20 MG tablet Take 1 tablet (20 mg total) by mouth daily. 30 tablet 0   amphetamine-dextroamphetamine (ADDERALL) 20 MG tablet Take 1 tablet (20 mg total) by mouth daily. 30 tablet 0   amphetamine-dextroamphetamine (ADDERALL) 30 MG tablet Take 1 tablet by mouth 2 (two) times daily. 60 tablet 0   amphetamine-dextroamphetamine (ADDERALL) 30 MG tablet Take 1 tablet by mouth 2 (two) times daily. 60 tablet 0   amphetamine-dextroamphetamine (ADDERALL) 30 MG tablet Take 1 tablet by mouth 2 (two) times daily. 60 tablet 0   ARIPiprazole (ABILIFY) 10 MG tablet Take 1 tablet (10 mg total) by mouth 2 (two) times daily. 60 tablet 5   benzonatate (TESSALON) 100 MG capsule Take 1 capsule (100 mg total) by mouth 3 (three) times daily as needed for cough. 30 capsule 0   buPROPion (  WELLBUTRIN XL) 150 MG 24 hr tablet Take one tablet every morning for 7 days, then increase to two tablets every morning. 60 tablet 5   busPIRone (BUSPAR) 10 MG tablet Take 1 tablet (10 mg total) by mouth 3 (three) times daily. 90 tablet 5   clonazePAM (KLONOPIN) 1 MG tablet TAKE ONE TABLET BY MOUTH FOUR TIMES A DAY AS NEEDED FOR ANXIETY 120 tablet 2   cyclobenzaprine (FLEXERIL) 5 MG tablet Take 1 tablet (5 mg total) by mouth  3 (three) times daily. 21 tablet 0   ibuprofen (ADVIL) 600 MG tablet Take 1 tablet (600 mg total) by mouth 3 (three) times daily. 21 tablet 0   lithium carbonate 150 MG capsule Take 1 capsule (150 mg total) by mouth at bedtime. 30 capsule 2   ondansetron (ZOFRAN-ODT) 4 MG disintegrating tablet '4mg'$  ODT q4 hours prn nausea/vomit 12 tablet 0   oxyCODONE-acetaminophen (PERCOCET/ROXICET) 5-325 MG tablet Take 1 tablet by mouth every 6 (six) hours as needed. 20 tablet 0   pantoprazole (PROTONIX) 20 MG tablet Take 1 tablet (20 mg total) by mouth daily. 30 tablet 0   No current facility-administered medications for this visit.    Medication Side Effects: None  Allergies:  Allergies  Allergen Reactions   Hydroxyzine Other (See Comments)    Dizziness, mental fogginess, nausea   Hydrocodone-Acetaminophen Hives, Itching and Rash    Past Medical History:  Diagnosis Date   Anxiety    Chicken pox    Headache    due to CSF leak   Heart murmur    "as a child"   Spinal stenosis     Past Medical History, Surgical history, Social history, and Family history were reviewed and updated as appropriate.   Please see review of systems for further details on the patient's review from today.   Objective:   Physical Exam:  There were no vitals taken for this visit.  Physical Exam Constitutional:      General: He is not in acute distress. Musculoskeletal:        General: No deformity.  Neurological:     Mental Status: He is alert and oriented to person, place, and time.     Coordination: Coordination normal.  Psychiatric:        Attention and Perception: Attention and perception normal. He does not perceive auditory or visual hallucinations.        Mood and Affect: Mood normal. Mood is not anxious or depressed. Affect is not labile, blunt, angry or inappropriate.        Speech: Speech normal.        Behavior: Behavior normal.        Thought Content: Thought content normal. Thought content is  not paranoid or delusional. Thought content does not include homicidal or suicidal ideation. Thought content does not include homicidal or suicidal plan.        Cognition and Memory: Cognition and memory normal.        Judgment: Judgment normal.     Comments: Insight intact     Lab Review:     Component Value Date/Time   NA 138 11/23/2021 0740   K 4.1 11/23/2021 0740   CL 104 11/23/2021 0740   CO2 27 11/23/2021 0740   GLUCOSE 113 (H) 11/23/2021 0740   BUN 14 11/23/2021 0740   CREATININE 1.00 11/23/2021 0740   CALCIUM 8.6 (L) 11/23/2021 0740   PROT 7.2 11/23/2021 0740   ALBUMIN 4.0 11/23/2021 0740   AST 25 11/23/2021  0740   ALT 24 11/23/2021 0740   ALKPHOS 74 11/23/2021 0740   BILITOT 0.6 11/23/2021 0740   GFRNONAA >60 11/23/2021 0740   GFRAA >60 06/23/2020 0620       Component Value Date/Time   WBC 7.2 11/23/2021 0740   RBC 4.93 11/23/2021 0740   HGB 15.4 11/23/2021 0740   HCT 46.5 11/23/2021 0740   PLT 205 11/23/2021 0740   MCV 94.3 11/23/2021 0740   MCH 31.2 11/23/2021 0740   MCHC 33.1 11/23/2021 0740   RDW 13.2 11/23/2021 0740   LYMPHSABS 0.7 11/23/2021 0740   MONOABS 0.5 11/23/2021 0740   EOSABS 0.3 11/23/2021 0740   BASOSABS 0.0 11/23/2021 0740    No results found for: "POCLITH", "LITHIUM"   No results found for: "PHENYTOIN", "PHENOBARB", "VALPROATE", "CBMZ"   .res Assessment: Plan:    Plan:  Abilify '20mg'$  daily to target mood instability.  Wellbutrin XL '300mg'$  daily - denies seizure history. Buspar '10mg'$  TID   Lithium '150mg'$  at bedtime - to target passive SI.  Klonopin '1mg'$  4 times daily - typically taking twice daily for anxiety attacks - last filled 6 weeks ago.  Adderall '30mg'$  BID  Adderall '20mg'$  in the afternoon for work - evening shift  ADHD outside testing - tested 12 - adult ADHD in office today. Consider referral for psychological testing.  RTC 4 weeks  Discussed service animal  Monitor BP between visits  Discussed potential metabolic  side effects associated with atypical antipsychotics, as well as potential risk for movement side effects. Advised pt to contact office if movement side effects occur.   Discussed potential benefits, risks, and side effects of stimulants with patient to include increased heart rate, palpitations, insomnia, increased anxiety, increased irritability, or decreased appetite.  Instructed patient to contact office if experiencing any significant tolerability issues.  Discussed potential benefits, risk, and side effects of benzodiazepines to include potential risk of tolerance and dependence, as well as possible drowsiness. Advised patient not to drive if experiencing drowsiness and to take lowest possible effective dose to minimize risk of dependence and tolerance. Diagnoses and all orders for this visit:  High risk medication use -     TSH -     Lithium level -     CMP and Liver  Bipolar I disorder (HCC)  Attention deficit hyperactivity disorder (ADHD), combined type  Insomnia, unspecified type  Generalized anxiety disorder     Please see After Visit Summary for patient specific instructions.  No future appointments.   Orders Placed This Encounter  Procedures   TSH   Lithium level   CMP and Liver    -------------------------------

## 2022-11-18 ENCOUNTER — Encounter: Payer: Self-pay | Admitting: Radiology

## 2023-01-14 ENCOUNTER — Ambulatory Visit (INDEPENDENT_AMBULATORY_CARE_PROVIDER_SITE_OTHER): Payer: 59 | Admitting: Adult Health

## 2023-01-14 ENCOUNTER — Encounter: Payer: Self-pay | Admitting: Adult Health

## 2023-01-14 DIAGNOSIS — F319 Bipolar disorder, unspecified: Secondary | ICD-10-CM

## 2023-01-14 DIAGNOSIS — F411 Generalized anxiety disorder: Secondary | ICD-10-CM | POA: Diagnosis not present

## 2023-01-14 DIAGNOSIS — F909 Attention-deficit hyperactivity disorder, unspecified type: Secondary | ICD-10-CM

## 2023-01-14 DIAGNOSIS — F331 Major depressive disorder, recurrent, moderate: Secondary | ICD-10-CM

## 2023-01-14 DIAGNOSIS — F39 Unspecified mood [affective] disorder: Secondary | ICD-10-CM

## 2023-01-14 DIAGNOSIS — Z79899 Other long term (current) drug therapy: Secondary | ICD-10-CM | POA: Diagnosis not present

## 2023-01-14 MED ORDER — CLONAZEPAM 1 MG PO TABS: ORAL_TABLET | ORAL | 2 refills | Status: DC

## 2023-01-14 MED ORDER — BUPROPION HCL ER (XL) 150 MG PO TB24: ORAL_TABLET | ORAL | 5 refills | Status: DC

## 2023-01-14 MED ORDER — AMPHETAMINE-DEXTROAMPHETAMINE 30 MG PO TABS: 30.0000 mg | ORAL_TABLET | Freq: Two times a day (BID) | ORAL | 0 refills | Status: DC

## 2023-01-14 MED ORDER — LITHIUM CARBONATE 150 MG PO CAPS: 150.0000 mg | ORAL_CAPSULE | Freq: Every day | ORAL | 2 refills | Status: DC

## 2023-01-14 MED ORDER — BUSPIRONE HCL 10 MG PO TABS: 10.0000 mg | ORAL_TABLET | Freq: Three times a day (TID) | ORAL | 5 refills | Status: DC

## 2023-01-14 MED ORDER — AMPHETAMINE-DEXTROAMPHETAMINE 20 MG PO TABS: 20.0000 mg | ORAL_TABLET | Freq: Every day | ORAL | 0 refills | Status: DC

## 2023-01-14 MED ORDER — ARIPIPRAZOLE 10 MG PO TABS: 10.0000 mg | ORAL_TABLET | Freq: Two times a day (BID) | ORAL | 5 refills | Status: DC

## 2023-01-14 NOTE — Progress Notes (Signed)
Jay Montgomery [id number] Feb 18, 1986 37 y.o.  Subjective:   Patient ID:  Jay Montgomery is a 37 y.o. (DOB 03/19/1986) male.  Chief Complaint: No chief complaint on file.   HPI Jay Montgomery presents to the office today for follow-up of GAD, MDD, insomnia, ADHD, and BPD 1.  Describes mood today as "ok". Pleasant. Mood symptoms - denies depression and anxiety. Reports some irritability and frustration "at times". Denies worry, rumination, and over thinking. Reports panic attacks - "a few". Mood is consistent. Stating "I'm doing great". Feels like current medication regimen works well. Family doing well. Stable interest and motivation. Taking medications as prescribed. Energy levels good. Active, does not have a regular exercise routine. Enjoys some usual interests. Lives with family - fiance and 4 children. Family local and supportive. Racing cars. Appetite adequate. Weight gain - 220 to 231 pounds - 75". Sleep is consistent. Averages 8 hours. Focus and concentration stable. Completing tasks. Manages aspects of household. Working full time. Denies SI or HI. Denies AH or VH. Denies self harm.  Denies substance use.  Medication trials: Xanax, Celexa, Valium  GAD-7    Flowsheet Row Office Visit from 02/15/2019 in [facility] Visit from 01/31/2019 in [facility] Visit from 01/25/2019 in [facility]  Total GAD-7 Score 3 11 18       PHQ2-9    Flowsheet Row Video Visit from 04/29/2020 in [facility] Visit from 01/25/2019 in [facility] Total Score 0 0  PHQ-9 Total Score 1 --      Flowsheet Row ED from 07/12/2022 in [facility] Emergency Department at [facility] ED from 11/23/2021 in [facility] Emergency Department at [facility] ED  from 07/22/2021 in [facility] Emergency Department at [facility]  C-SSRS RISK CATEGORY No Risk No Risk No Risk        Review of Systems:  Review of Systems  Musculoskeletal:  Negative for gait problem.  Neurological:  Negative for tremors.  Psychiatric/Behavioral:         Please refer to HPI    Medications: I have reviewed the patient's current medications.  Current Outpatient Medications  Medication Sig Dispense Refill   albuterol (VENTOLIN HFA) 108 (90 Base) MCG/ACT inhaler Inhale 2 puffs into the lungs every 6 (six) hours as needed for wheezing or shortness of breath. 8 g 0   amphetamine-dextroamphetamine (ADDERALL) 20 MG tablet Take 1 tablet (20 mg total) by mouth daily. 30 tablet 0   amphetamine-dextroamphetamine (ADDERALL) 20 MG tablet Take 1 tablet (20 mg total) by mouth daily. 30 tablet 0   amphetamine-dextroamphetamine (ADDERALL) 20 MG tablet Take 1 tablet (20 mg total) by mouth daily. 30 tablet 0   amphetamine-dextroamphetamine (ADDERALL) 30 MG tablet Take 1 tablet by mouth 2 (two) times daily. 60 tablet 0   amphetamine-dextroamphetamine (ADDERALL) 30 MG tablet Take 1 tablet by mouth 2 (two) times daily. 60 tablet 0   amphetamine-dextroamphetamine (ADDERALL) 30 MG tablet Take 1 tablet by mouth 2 (two) times daily. 60 tablet 0   ARIPiprazole (ABILIFY) 10 MG tablet Take 1 tablet (10 mg total) by mouth 2 (two) times daily. 60 tablet 5   benzonatate (TESSALON) 100 MG capsule Take 1 capsule (100 mg total) by mouth 3 (three) times daily as needed for cough. 30 capsule 0   buPROPion [facility]  XL) 150 MG 24 hr tablet Take one tablet every morning for 7 days, then increase to two tablets every morning. 60 tablet 5   busPIRone (BUSPAR) 10 MG tablet Take 1 tablet (10 mg total) by mouth 3 (three) times daily. 90 tablet 5   clonazePAM (KLONOPIN) 1 MG tablet TAKE ONE TABLET BY MOUTH FOUR TIMES A DAY AS NEEDED FOR ANXIETY 120 tablet 2   cyclobenzaprine (FLEXERIL) 5 MG tablet Take  1 tablet (5 mg total) by mouth 3 (three) times daily. 21 tablet 0   ibuprofen (ADVIL) 600 MG tablet Take 1 tablet (600 mg total) by mouth 3 (three) times daily. 21 tablet 0   lithium carbonate 150 MG capsule Take 1 capsule (150 mg total) by mouth at bedtime. 30 capsule 2   ondansetron (ZOFRAN-ODT) 4 MG disintegrating tablet 4mg  ODT q4 hours prn nausea/vomit 12 tablet 0   oxyCODONE-acetaminophen (PERCOCET/ROXICET) 5-325 MG tablet Take 1 tablet by mouth every 6 (six) hours as needed. 20 tablet 0   pantoprazole (PROTONIX) 20 MG tablet Take 1 tablet (20 mg total) by mouth daily. 30 tablet 0   No current facility-administered medications for this visit.    Medication Side Effects: None  Allergies:  Allergies  Allergen Reactions   Hydroxyzine Other (See Comments)    Dizziness, mental fogginess, nausea   Hydrocodone-Acetaminophen Hives, Itching and Rash    Past Medical History:  Diagnosis Date   Anxiety    Chicken pox    Headache    due to CSF leak   Heart murmur    "as a child"   Spinal stenosis     Past Medical History, Surgical history, Social history, and Family history were reviewed and updated as appropriate.   Please see review of systems for further details on the patient's review from today.   Objective:   Physical Exam:  There were no vitals taken for this visit.  Physical Exam Constitutional:      General: He is not in acute distress. Musculoskeletal:        General: No deformity.  Neurological:     Mental Status: He is alert and oriented to person, place, and time.     Coordination: Coordination normal.  Psychiatric:        Attention and Perception: Attention and perception normal. He does not perceive auditory or visual hallucinations.        Mood and Affect: Mood normal. Mood is not anxious or depressed. Affect is not labile, blunt, angry or inappropriate.        Speech: Speech normal.        Behavior: Behavior normal.        Thought Content: Thought  content normal. Thought content is not paranoid or delusional. Thought content does not include homicidal or suicidal ideation. Thought content does not include homicidal or suicidal plan.        Cognition and Memory: Cognition and memory normal.        Judgment: Judgment normal.     Comments: Insight intact     Lab Review:     Component Value Date/Time   NA 138 11/23/2021 0740   K 4.1 11/23/2021 0740   CL 104 11/23/2021 0740   CO2 27 11/23/2021 0740   GLUCOSE 113 (H) 11/23/2021 0740   BUN 14 11/23/2021 0740   CREATININE 1.00 11/23/2021 0740   CALCIUM 8.6 (L) 11/23/2021 0740   PROT 7.2 11/23/2021 0740   ALBUMIN 4.0 11/23/2021 0740   AST 25 11/23/2021 0740  ALT 24 11/23/2021 0740   ALKPHOS 74 11/23/2021 0740   BILITOT 0.6 11/23/2021 0740   GFRNONAA >60 11/23/2021 0740   GFRAA >60 06/23/2020 0620       Component Value Date/Time   WBC 7.2 11/23/2021 0740   RBC 4.93 11/23/2021 0740   HGB 15.4 11/23/2021 0740   HCT 46.5 11/23/2021 0740   PLT 205 11/23/2021 0740   MCV 94.3 11/23/2021 0740   MCH 31.2 11/23/2021 0740   MCHC 33.1 11/23/2021 0740   RDW 13.2 11/23/2021 0740   LYMPHSABS 0.7 11/23/2021 0740   MONOABS 0.5 11/23/2021 0740   EOSABS 0.3 11/23/2021 0740   BASOSABS 0.0 11/23/2021 0740    No results found for: "POCLITH", "LITHIUM"   No results found for: "PHENYTOIN", "PHENOBARB", "VALPROATE", "CBMZ"   .res Assessment: Plan:    Plan:  Abilify 20mg  daily to target mood instability.  Wellbutrin XL 300mg  daily - denies seizure history. Buspar 10mg  TID   Lithium 150mg  at bedtime - to target passive SI. Lab slips given.  Klonopin 1mg  4 times daily - typically taking twice daily for anxiety attacks - last filled 6 weeks ago.  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 Consider referral for psychological testing.  RTC 3 months  Discussed service animal  Monitor  BP between visits  Discussed potential metabolic side effects associated with atypical antipsychotics, as well as potential risk for movement side effects. Advised pt to contact office if movement side effects occur.   Discussed potential benefits, risks, and side effects of stimulants with patient to include increased heart rate, palpitations, insomnia, increased anxiety, increased irritability, or decreased appetite.  Instructed patient to contact office if experiencing any significant tolerability issues.  Discussed potential benefits, risk, and side effects of benzodiazepines to include potential risk of tolerance and dependence, as well as possible drowsiness. Advised patient not to drive if experiencing drowsiness and to take lowest possible effective dose to minimize risk of dependence and tolerance.  Diagnoses and all orders for this visit:  There are no diagnoses linked to this encounter.   Please see After Visit Summary for patient specific instructions.  No future appointments.  No orders of the defined types were placed in this encounter.   -------------------------------

## 2023-01-17 DIAGNOSIS — F331 Major depressive disorder, recurrent, moderate: Secondary | ICD-10-CM | POA: Insufficient documentation

## 2023-02-28 DIAGNOSIS — Z0289 Encounter for other administrative examinations: Secondary | ICD-10-CM

## 2023-03-01 ENCOUNTER — Ambulatory Visit (INDEPENDENT_AMBULATORY_CARE_PROVIDER_SITE_OTHER): Payer: 59 | Admitting: Adult Health

## 2023-03-01 ENCOUNTER — Encounter: Payer: Self-pay | Admitting: Adult Health

## 2023-03-01 DIAGNOSIS — G4700 Insomnia, unspecified: Secondary | ICD-10-CM | POA: Diagnosis not present

## 2023-03-01 DIAGNOSIS — F411 Generalized anxiety disorder: Secondary | ICD-10-CM | POA: Diagnosis not present

## 2023-03-01 DIAGNOSIS — F319 Bipolar disorder, unspecified: Secondary | ICD-10-CM

## 2023-03-01 DIAGNOSIS — F909 Attention-deficit hyperactivity disorder, unspecified type: Secondary | ICD-10-CM | POA: Diagnosis not present

## 2023-03-01 MED ORDER — CLONIDINE HCL 0.1 MG PO TABS: ORAL_TABLET | ORAL | 2 refills | Status: DC
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
## 2023-03-07 ENCOUNTER — Ambulatory Visit: Payer: 59 | Admitting: Adult Health

## 2023-03-09 ENCOUNTER — Telehealth: Payer: Self-pay | Admitting: Adult Health

## 2023-03-10 NOTE — Telephone Encounter (Signed)
error 

## 2023-03-11 ENCOUNTER — Telehealth: Payer: Self-pay

## 2023-03-11 NOTE — Telephone Encounter (Signed)
Paper work for [employer] completed and faxed as well as last office note.

## 2023-03-14 ENCOUNTER — Telehealth: Payer: Self-pay | Admitting: Adult Health

## 2023-03-14 NOTE — Telephone Encounter (Signed)
Wife called asking for a letter for husband Jay Montgomery. The letter should state that gina put him out of work on June 5th 2024. The we need to fax the letter to his wife Rodney Booze at [phone]

## 2023-03-14 NOTE — Telephone Encounter (Signed)
Please see message from wife. Would this be addressed with FMLA? No documentation for issues on this date.

## 2023-03-15 NOTE — Telephone Encounter (Signed)
Rtc to wife and pt needs letter stating his last apt date along with under Regina's care and dates out of work. Letter written but unable to print due to printers being off line all afternoon.

## 2023-03-16 NOTE — Telephone Encounter (Signed)
Letter done and emailed.

## 2023-03-25 ENCOUNTER — Ambulatory Visit: Payer: 59 | Admitting: Adult Health

## 2023-03-25 ENCOUNTER — Telehealth: Payer: Self-pay | Admitting: Adult Health

## 2023-03-25 NOTE — Telephone Encounter (Signed)
Patient was started on clonidine 0.1 mg 6/11. He is reporting it is not helping with sleep. Patient is on Adderall 30 and 20 and reports taking 30 mg upon awakening between 9:30 and 10:00. He takes the 20 mg around 3:00. He also reports drinking caffeine in the form of sodas.  Advised him not to have caffeine after 3:00 pm.  He takes the clonidine around 9:00 pm. He is asking if he can increase the clonidine.

## 2023-03-25 NOTE — Telephone Encounter (Signed)
Pt called and said that the clonidine that was prescribed for sleep is not working. It doesn't put him to sleep. His next appt is 7/11. He wants to know can it be increased or does he need to try something else. Please call him at [phone]

## 2023-03-31 ENCOUNTER — Encounter: Payer: Self-pay | Admitting: Adult Health

## 2023-03-31 ENCOUNTER — Ambulatory Visit (INDEPENDENT_AMBULATORY_CARE_PROVIDER_SITE_OTHER): Payer: 59 | Admitting: Adult Health

## 2023-03-31 DIAGNOSIS — F411 Generalized anxiety disorder: Secondary | ICD-10-CM | POA: Diagnosis not present

## 2023-03-31 DIAGNOSIS — G4700 Insomnia, unspecified: Secondary | ICD-10-CM

## 2023-03-31 DIAGNOSIS — F319 Bipolar disorder, unspecified: Secondary | ICD-10-CM | POA: Diagnosis not present

## 2023-03-31 DIAGNOSIS — F909 Attention-deficit hyperactivity disorder, unspecified type: Secondary | ICD-10-CM | POA: Diagnosis not present

## 2023-03-31 NOTE — Progress Notes (Addendum)
Jay Montgomery [id number] 11-20-1985 36 y.o.  Subjective:   Patient ID:  Jay Montgomery is a 37 y.o. (DOB [birth year]) male.  Chief Complaint: No chief complaint on file.   HPI Jay Montgomery presents to the office today for follow-up of GAD, MDD, insomnia, ADHD, and BPD 1.  Describes mood today as "not the best". Pleasant. Flat. Reports tearfulness. Mood symptoms - reports some depression - feeling down. Reports anxiety. Reports social anxiety. Reports panic attacks. Reports irritability. Reports he is still easy agitated. Reports worry, rumination, and over thinking. Mood is up and down. Stating "I feel like I'm still struggling". Does not feel like he is ready to return to the work setting " my mood is not stable". Feels like current medication regimen is helpful, but is still experiencing mood swings and instability. Decreased interest and motivation. Taking medications as prescribed. Energy levels lower. Active, does not have a regular exercise routine. Enjoys some usual interests. Family doing well. Lives with family - fiance and 4 children. Family local and supportive.   Appetite adequate. Weight gain - 229 pounds - 75". Sleeping better some nights than others. Averages 6 to 7 hours with addition of Clonidine. Focus and concentration stable. Completing tasks. Manages aspects of household. Working full time - out on disability currently. Denies SI or HI. Denies AH or VH. Denies self harm.  Denies substance use.  Medication trials: Xanax, Celexa, Valium   GAD-7    Flowsheet Row Office Visit from 02/15/2019 in [facility] Visit from 01/31/2019 in [facility] Visit from 01/25/2019 in [facility]  Total GAD-7 Score 3 11 18       PHQ2-9    Flowsheet Row Video Visit from 04/29/2020 in [facility] Visit from 01/25/2019 in [facility] Total Score 0 0  PHQ-9 Total Score 1 --      Flowsheet Row ED from 07/12/2022 in [facility] at [facility] ED from 11/23/2021 in [facility] ED from 07/22/2021 in [facility] Department at [facility]  C-SSRS RISK CATEGORY No Risk No Risk No Risk        Review of Systems:  Review of Systems  Musculoskeletal:  Negative for gait problem.  Neurological:  Negative for tremors.  Psychiatric/Behavioral:         Please refer to HPI    Medications: I have reviewed the patient's current medications.  Current Outpatient Medications  Medication Sig Dispense Refill   albuterol (VENTOLIN HFA) 108 (90 Base) MCG/ACT inhaler Inhale 2 puffs into the lungs every 6 (six) hours as needed for wheezing or shortness of breath. 8 g 0   amphetamine-dextroamphetamine (ADDERALL) 20 MG tablet Take 1 tablet (20 mg total) by mouth daily. 30 tablet 0   amphetamine-dextroamphetamine (ADDERALL) 20 MG tablet Take 1 tablet (20 mg total) by mouth daily. 30 tablet 0   amphetamine-dextroamphetamine (ADDERALL) 20 MG tablet Take 1 tablet (20 mg total) by mouth daily. 30 tablet 0   amphetamine-dextroamphetamine (ADDERALL) 30 MG tablet Take 1 tablet by mouth 2 (two) times daily. 60 tablet 0   amphetamine-dextroamphetamine (ADDERALL) 30 MG tablet Take 1 tablet by mouth 2 (two) times daily. 60 tablet 0   amphetamine-dextroamphetamine (ADDERALL) 30 MG tablet Take 1 tablet by mouth 2 (two) times daily.  60 tablet 0   ARIPiprazole (ABILIFY) 10 MG tablet Take 1 tablet (10 mg total) by mouth 2 (two) times daily. 60 tablet 5   benzonatate (TESSALON) 100 MG capsule Take 1 capsule (100 mg total) by mouth 3 (three) times daily as needed for cough. 30 capsule 0   buPROPion (WELLBUTRIN XL) 150 MG 24 hr tablet Take one tablet every morning for 7 days, then  increase to two tablets every morning. 60 tablet 5   busPIRone (BUSPAR) 10 MG tablet Take 1 tablet (10 mg total) by mouth 3 (three) times daily. 90 tablet 5   clonazePAM (KLONOPIN) 1 MG tablet TAKE ONE TABLET BY MOUTH FOUR TIMES A DAY AS NEEDED FOR ANXIETY 120 tablet 2   cloNIDine (CATAPRES) 0.1 MG tablet Take one tablet at bedtime. 30 tablet 2   cyclobenzaprine (FLEXERIL) 5 MG tablet Take 1 tablet (5 mg total) by mouth 3 (three) times daily. 21 tablet 0   ibuprofen (ADVIL) 600 MG tablet Take 1 tablet (600 mg total) by mouth 3 (three) times daily. 21 tablet 0   lithium carbonate 150 MG capsule Take 1 capsule (150 mg total) by mouth at bedtime. 30 capsule 2   ondansetron (ZOFRAN-ODT) 4 MG disintegrating tablet 4mg  ODT q4 hours prn nausea/vomit 12 tablet 0   oxyCODONE-acetaminophen (PERCOCET/ROXICET) 5-325 MG tablet Take 1 tablet by mouth every 6 (six) hours as needed. 20 tablet 0   pantoprazole (PROTONIX) 20 MG tablet Take 1 tablet (20 mg total) by mouth daily. 30 tablet 0   No current facility-administered medications for this visit.    Medication Side Effects: None  Allergies:  Allergies  Allergen Reactions   Hydroxyzine Other (See Comments)    Dizziness, mental fogginess, nausea   Hydrocodone-Acetaminophen Hives, Itching and Rash    Past Medical History:  Diagnosis Date   Anxiety    Chicken pox    Headache    due to CSF leak   Heart murmur    "as a child"   Spinal stenosis     Past Medical History, Surgical history, Social history, and Family history were reviewed and updated as appropriate.   Please see review of systems for further details on the patient's review from today.   Objective:   Physical Exam:  There were no vitals taken for this visit.  Physical Exam Constitutional:      General: He is not in acute distress. Musculoskeletal:        General: No deformity.  Neurological:     Mental Status: He is alert and oriented to person, place, and time.      Coordination: Coordination normal.  Psychiatric:        Attention and Perception: Attention and perception normal. He does not perceive auditory or visual hallucinations.        Mood and Affect: Mood is anxious and depressed. Affect is flat. Affect is not labile, blunt, angry or inappropriate.        Speech: Speech normal.        Behavior: Behavior normal.        Thought Content: Thought content normal. Thought content is not paranoid or delusional. Thought content does not include homicidal or suicidal ideation. Thought content does not include homicidal or suicidal plan.        Cognition and Memory: Cognition and memory normal.        Judgment: Judgment normal.     Comments: Insight intact     Lab Review:     Component  Value Date/Time   NA 138 11/23/2021 0740   K 4.1 11/23/2021 0740   CL 104 11/23/2021 0740   CO2 27 11/23/2021 0740   GLUCOSE 113 (H) 11/23/2021 0740   BUN 14 11/23/2021 0740   CREATININE 1.00 11/23/2021 0740   CALCIUM 8.6 (L) 11/23/2021 0740   PROT 7.2 11/23/2021 0740   ALBUMIN 4.0 11/23/2021 0740   AST 25 11/23/2021 0740   ALT 24 11/23/2021 0740   ALKPHOS 74 11/23/2021 0740   BILITOT 0.6 11/23/2021 0740   GFRNONAA >60 11/23/2021 0740   GFRAA >60 06/23/2020 0620       Component Value Date/Time   WBC 7.2 11/23/2021 0740   RBC 4.93 11/23/2021 0740   HGB 15.4 11/23/2021 0740   HCT 46.5 11/23/2021 0740   PLT 205 11/23/2021 0740   MCV 94.3 11/23/2021 0740   MCH 31.2 11/23/2021 0740   MCHC 33.1 11/23/2021 0740   RDW 13.2 11/23/2021 0740   LYMPHSABS 0.7 11/23/2021 0740   MONOABS 0.5 11/23/2021 0740   EOSABS 0.3 11/23/2021 0740   BASOSABS 0.0 11/23/2021 0740    No results found for: "POCLITH", "LITHIUM"   No results found for: "PHENYTOIN", "PHENOBARB", "VALPROATE", "CBMZ"   .res Assessment: Plan:    Plan:  Abilify 20mg  daily to target mood instability.  Wellbutrin XL 300mg  daily - denies seizure history. Buspar 10mg  TID   Lithium 150mg  at  bedtime - to target passive SI. Lab slips given.  Klonopin 1mg  4 times daily - typically taking twice daily for anxiety attacks - last filled 6 weeks ago.  Adderall 30mg  BID  Adderall 20mg  in the afternoon for work - evening shift  Clonidine 0.1mg  at bedtime.  Patient out of work 03/28/2023 through 04/29/2023. Will reassess a return to work date at next appointment on 04/29/2023.  ADHD outside testing - tested 51 - adult ADHD in office today. Consider referral for psychological testing.  RTC 4 weeks  Discussed service animal  Monitor BP between visits  Discussed potential metabolic side effects associated with atypical antipsychotics, as well as potential risk for movement side effects. Advised pt to contact office if movement side effects occur.   Diagnoses and all orders for this visit:  Bipolar I disorder (HCC)  Attention deficit hyperactivity disorder (ADHD), unspecified ADHD type  Generalized anxiety disorder  Insomnia, unspecified type     Please see After Visit Summary for patient specific instructions.  Future Appointments  Date Time Provider Department Center  04/15/2023 10:00 AM Priyana Mccarey, Thereasa Solo, NP [facility] None    No orders of the defined types were placed in this encounter.   -------------------------------

## 2023-04-04 ENCOUNTER — Telehealth: Payer: Self-pay | Admitting: Adult Health

## 2023-04-04 NOTE — Telephone Encounter (Signed)
Wife called and said that Jay Montgomery needs a note of being out of work until his next ap[pt in 8/9. Please fax the letter to  [phone]

## 2023-04-04 NOTE — Telephone Encounter (Signed)
Please see message. Patient has FMLA. Last note Patient out of work 03/28/2023 through 04/29/2023. ? If he needs a note.

## 2023-04-05 NOTE — Telephone Encounter (Signed)
Will clarify on these dates his next apt is 7/26. Last note on 07/11 says will reassess on 07/09 but out through 08/09.

## 2023-04-06 NOTE — Telephone Encounter (Signed)
It was put in admin box

## 2023-04-06 NOTE — Telephone Encounter (Signed)
Letter typed with out of work until 08//05/2023, then reassessed at that apt.  His apt scheduled 03/22/2023 needs to be canceled, keep the 04/29/23 apt.

## 2023-04-06 NOTE — Telephone Encounter (Signed)
Did you fax the letter

## 2023-04-15 ENCOUNTER — Ambulatory Visit: Payer: 59 | Admitting: Adult Health

## 2023-04-29 ENCOUNTER — Ambulatory Visit: Payer: 59 | Admitting: Adult Health

## 2023-04-29 ENCOUNTER — Encounter: Payer: Self-pay | Admitting: Adult Health

## 2023-04-29 DIAGNOSIS — G4700 Insomnia, unspecified: Secondary | ICD-10-CM | POA: Diagnosis not present

## 2023-04-29 DIAGNOSIS — Z79899 Other long term (current) drug therapy: Secondary | ICD-10-CM | POA: Diagnosis not present

## 2023-04-29 MED ORDER — TRAZODONE HCL 50 MG PO TABS: 50.0000 mg | ORAL_TABLET | Freq: Every day | ORAL | 2 refills | Status: DC

## 2023-04-29 NOTE — Progress Notes (Signed)
Jay Montgomery [id number] 1985-12-12 37 y.o.  Subjective:   Patient ID:  Jay Montgomery is a 37 y.o. (DOB October 06, 1985) male.  Chief Complaint: No chief complaint on file.   HPI [name] presents to the office today for follow-up of GAD, MDD, insomnia, ADHD, and BPD 1.  Describes mood today as "about the same, not much better". Pleasant. Flat. Denies recent tearfulness. Mood symptoms - reports depression - "it's about 50/50". Reports decreased interest and motivation. Reports anxiety - "it's through the roof". Reports increased stressors. Reports social anxiety. Reports panic attacks. Reports decreased irritability. Reports feeling agitated - "at baseline". Reports worry, rumination, and over thinking. Mood is up and down. Stating "I feel like I'm doing better some days, and other not". Does not feel like he is ready to return to the work setting. Feels like current medication regimen is helpful. Taking medications as prescribed. Energy levels lower. Active, does not have a regular exercise routine. Unable to enjoy usual interests. Family doing well. Lives with family - fiance and 4 children. Family local and supportive.   Appetite adequate. Weight gain - 229 to 236 pounds - 75". Sleeping better some nights than others. Averages 6 to 7 hours of broken sleep. Denies daytime napping. Focus and concentration stable. Completing tasks. Manages aspects of household. Out of work currently. Denies SI or HI. Denies AH or VH. Denies self harm.  Denies substance use.  Medication trials: Xanax, Celexa, Valium    GAD-7    Flowsheet Row Office Visit from 02/15/2019 in [facility] Visit from 01/31/2019 in [facility] Visit from 01/25/2019 in [facility]  Total GAD-7 Score 3 11 18       PHQ2-9    Flowsheet Row Video Visit from 04/29/2020 in [facility] Visit from 01/25/2019 in [facility] at [facility] Total Score 0 0  PHQ-9 Total Score 1 --      Flowsheet Row ED from 07/12/2022 in [facility] at [facility] ED from 11/23/2021 in [facility] Department at [facility] ED from 07/22/2021 in [facility] at [facility]  C-SSRS RISK CATEGORY No Risk No Risk No Risk        Review of Systems:  Review of Systems  Musculoskeletal:  Negative for gait problem.  Neurological:  Negative for tremors.  Psychiatric/Behavioral:         Please refer to HPI    Medications: I have reviewed the patient's current medications.  Current Outpatient Medications  Medication Sig Dispense Refill   albuterol (VENTOLIN HFA) 108 (90 Base) MCG/ACT inhaler Inhale 2 puffs into the lungs every 6 (six) hours as needed for wheezing or shortness of breath. 8 g 0   amphetamine-dextroamphetamine (ADDERALL) 20 MG tablet Take 1 tablet (20 mg total) by mouth daily. 30 tablet 0   amphetamine-dextroamphetamine (ADDERALL) 20 MG tablet Take 1 tablet (20 mg total) by mouth daily. 30 tablet 0   amphetamine-dextroamphetamine (ADDERALL) 20 MG tablet Take 1 tablet (20 mg total) by mouth daily. 30 tablet 0   amphetamine-dextroamphetamine (ADDERALL) 30 MG tablet Take 1 tablet by mouth 2 (two) times daily. 60 tablet 0   amphetamine-dextroamphetamine (ADDERALL) 30 MG tablet Take 1 tablet by mouth 2 (two) times daily. 60 tablet 0   amphetamine-dextroamphetamine (ADDERALL) 30 MG tablet Take 1  tablet by mouth 2 (two) times daily. 60 tablet 0   ARIPiprazole (ABILIFY) 10 MG tablet Take 1 tablet (10 mg total) by mouth 2 (two) times daily. 60 tablet 5   benzonatate (TESSALON) 100 MG capsule Take 1 capsule (100 mg total) by mouth 3 (three) times daily as needed for cough. 30 capsule 0   buPROPion (WELLBUTRIN XL) 150 MG 24 hr tablet Take  one tablet every morning for 7 days, then increase to two tablets every morning. 60 tablet 5   busPIRone (BUSPAR) 10 MG tablet Take 1 tablet (10 mg total) by mouth 3 (three) times daily. 90 tablet 5   clonazePAM (KLONOPIN) 1 MG tablet TAKE ONE TABLET BY MOUTH FOUR TIMES A DAY AS NEEDED FOR ANXIETY 120 tablet 2   cloNIDine (CATAPRES) 0.1 MG tablet Take one tablet at bedtime. 30 tablet 2   cyclobenzaprine (FLEXERIL) 5 MG tablet Take 1 tablet (5 mg total) by mouth 3 (three) times daily. 21 tablet 0   ibuprofen (ADVIL) 600 MG tablet Take 1 tablet (600 mg total) by mouth 3 (three) times daily. 21 tablet 0   lithium carbonate 150 MG capsule Take 1 capsule (150 mg total) by mouth at bedtime. 30 capsule 2   ondansetron (ZOFRAN-ODT) 4 MG disintegrating tablet 4mg  ODT q4 hours prn nausea/vomit 12 tablet 0   oxyCODONE-acetaminophen (PERCOCET/ROXICET) 5-325 MG tablet Take 1 tablet by mouth every 6 (six) hours as needed. 20 tablet 0   pantoprazole (PROTONIX) 20 MG tablet Take 1 tablet (20 mg total) by mouth daily. 30 tablet 0   No current facility-administered medications for this visit.    Medication Side Effects: None  Allergies:  Allergies  Allergen Reactions   Hydroxyzine Other (See Comments)    Dizziness, mental fogginess, nausea   Hydrocodone-Acetaminophen Hives, Itching and Rash    Past Medical History:  Diagnosis Date   Anxiety    Chicken pox    Headache    due to CSF leak   Heart murmur    "as a child"   Spinal stenosis     Past Medical History, Surgical history, Social history, and Family history were reviewed and updated as appropriate.   Please see review of systems for further details on the patient's review from today.   Objective:   Physical Exam:  There were no vitals taken for this visit.  Physical Exam Constitutional:      General: He is not in acute distress. Musculoskeletal:        General: No deformity.  Neurological:     Mental Status: He is alert and  oriented to person, place, and time.     Coordination: Coordination normal.  Psychiatric:        Attention and Perception: Attention and perception normal. He does not perceive auditory or visual hallucinations.        Mood and Affect: Mood is anxious and depressed. Affect is flat. Affect is not labile, blunt, angry or inappropriate.        Speech: Speech normal.        Behavior: Behavior normal.        Thought Content: Thought content normal. Thought content is not paranoid or delusional. Thought content does not include homicidal or suicidal ideation. Thought content does not include homicidal or suicidal plan.        Cognition and Memory: Cognition and memory normal.        Judgment: Judgment normal.     Comments: Insight intact  Lab Review:     Component Value Date/Time   NA 138 11/23/2021 0740   K 4.1 11/23/2021 0740   CL 104 11/23/2021 0740   CO2 27 11/23/2021 0740   GLUCOSE 113 (H) 11/23/2021 0740   BUN 14 11/23/2021 0740   CREATININE 1.00 11/23/2021 0740   CALCIUM 8.6 (L) 11/23/2021 0740   PROT 7.2 11/23/2021 0740   ALBUMIN 4.0 11/23/2021 0740   AST 25 11/23/2021 0740   ALT 24 11/23/2021 0740   ALKPHOS 74 11/23/2021 0740   BILITOT 0.6 11/23/2021 0740   GFRNONAA >60 11/23/2021 0740   GFRAA >60 06/23/2020 0620       Component Value Date/Time   WBC 7.2 11/23/2021 0740   RBC 4.93 11/23/2021 0740   HGB 15.4 11/23/2021 0740   HCT 46.5 11/23/2021 0740   PLT 205 11/23/2021 0740   MCV 94.3 11/23/2021 0740   MCH 31.2 11/23/2021 0740   MCHC 33.1 11/23/2021 0740   RDW 13.2 11/23/2021 0740   LYMPHSABS 0.7 11/23/2021 0740   MONOABS 0.5 11/23/2021 0740   EOSABS 0.3 11/23/2021 0740   BASOSABS 0.0 11/23/2021 0740    No results found for: "POCLITH", "LITHIUM"   No results found for: "PHENYTOIN", "PHENOBARB", "VALPROATE", "CBMZ"   .res Assessment: Plan:    Plan:  Abilify 20mg  daily to target mood instability.  Wellbutrin XL 300mg  daily - denies seizure  history. Buspar 10mg  TID   Lithium 150mg  at bedtime - to target passive SI. Lab slips given.  Klonopin 1mg  4 times daily - typically taking twice daily for anxiety attacks - last filled 6 weeks ago.  Adderall 30mg  BID  Adderall 20mg  in the afternoon for work - evening shift  D/C Clonidine 0.1mg  at bedtime. Add Trazadone 50mg  daily  Patient out of work 04/29/2023 through 05/30/2023. Will reassess a return to work date at next appointment on 04/29/2023.  ADHD outside testing - tested 73 - adult ADHD in office today. Consider referral for psychological testing.  RTC 4 weeks  Discussed service animal  Monitor BP between visits  Discussed potential metabolic side effects associated with atypical antipsychotics, as well as potential risk for movement side effects. Advised pt to contact office if movement side effects occur.   Discussed potential benefits, risk, and side effects of benzodiazepines to include potential risk of tolerance and dependence, as well as possible drowsiness.  Advised patient not to drive if experiencing drowsiness and to take lowest possible effective dose to minimize risk of dependence and tolerance.   Discussed potential benefits, risks, and side effects of stimulants with patient to include increased heart rate, palpitations, insomnia, increased anxiety, increased irritability, or decreased appetite.  Instructed patient to contact office if experiencing any significant tolerability issues.   There are no diagnoses linked to this encounter.   Please see After Visit Summary for patient specific instructions.  Future Appointments  Date Time Provider Department Center  04/29/2023 10:20 AM Artemisia Auvil, Thereasa Solo, NP CP-[facility] None    No orders of the defined types were placed in this encounter.   -------------------------------

## 2023-05-03 ENCOUNTER — Telehealth: Payer: Self-pay | Admitting: Adult Health

## 2023-05-03 NOTE — Telephone Encounter (Signed)
We received medical records request from [employer] on 05/03/23

## 2023-05-30 ENCOUNTER — Ambulatory Visit: Payer: 59 | Admitting: Adult Health

## 2023-05-30 ENCOUNTER — Ambulatory Visit (INDEPENDENT_AMBULATORY_CARE_PROVIDER_SITE_OTHER): Payer: 59 | Admitting: Adult Health

## 2023-05-30 ENCOUNTER — Encounter: Payer: Self-pay | Admitting: Adult Health

## 2023-05-30 DIAGNOSIS — F909 Attention-deficit hyperactivity disorder, unspecified type: Secondary | ICD-10-CM

## 2023-05-30 DIAGNOSIS — F319 Bipolar disorder, unspecified: Secondary | ICD-10-CM | POA: Diagnosis not present

## 2023-05-30 DIAGNOSIS — G4700 Insomnia, unspecified: Secondary | ICD-10-CM

## 2023-05-30 DIAGNOSIS — F411 Generalized anxiety disorder: Secondary | ICD-10-CM

## 2023-05-30 NOTE — Progress Notes (Signed)
Jay Montgomery [id number] 01/17/1986 36 y.o.  Subjective:   Patient ID:  Jay Montgomery is a 37 y.o. (DOB [birth year]) male.  Chief Complaint: No chief complaint on file.   HPI Jay Montgomery presents to the office today for follow-up of GAD, insomnia, ADHD, and BPD 1.  Describes mood today as "about the same". Pleasant. Flat. Denies recent tearfulness. Mood symptoms - reports depression. Reports decreased interest and motivation. Reports anxiety - "it just depends on the day". Reports  feeling stressed. Reports social anxiety. Reports panic attacks - 1 to 2 a week. Reports decreased irritability. Reports feeling agitated - "at times". Reports worry, rumination, and over thinking. Mood is up and down - fluctuates. Stating "I feel like I'm having a few good days and then bad days". Does not feel like he is ready to return to the work setting. Feels like current medication regimen is helpful. Taking medications as prescribed. Energy levels lower. Active, does not have a regular exercise routine. Unable to enjoy usual interests. Family doing well. Lives with family - fiance and 4 children. Family local and supportive.   Appetite adequate. Weight loss - pounds - 75". Reports sleep issues. Averages 6 hours of broken sleep. Denies daytime napping. Focus and concentration "alright". Completing tasks. Manages some aspects of household. Out of work currently. Denies SI or HI. Denies AH or VH. Denies self harm.  Denies substance use.  Medication trials: Xanax, Celexa, Valium   GAD-7    Flowsheet Row Office Visit from 02/15/2019 in [facility] Visit from 01/31/2019 in [facility] at [facility] Visit from 01/25/2019 in [facility] at [facility]  Total GAD-7 Score 3 11 18       PHQ2-9    Flowsheet Row Video Visit from 04/29/2020 in [facility] at [facility] Visit from 01/25/2019 in [facility] Total Score 0 0  PHQ-9 Total Score 1 --      Flowsheet Row ED from 07/12/2022 in [facility] Department at [facility] ED from 11/23/2021 in [facility] Emergency Department at [facility] ED from 07/22/2021 in [facility] Department at [facility]  C-SSRS RISK CATEGORY No Risk No Risk No Risk        Review of Systems:  Review of Systems  Musculoskeletal:  Negative for gait problem.  Neurological:  Negative for tremors.  Psychiatric/Behavioral:         Please refer to HPI    Medications: I have reviewed the patient's current medications.  Current Outpatient Medications  Medication Sig Dispense Refill   albuterol (VENTOLIN HFA) 108 (90 Base) MCG/ACT inhaler Inhale 2 puffs into the lungs every 6 (six) hours as needed for wheezing or shortness of breath. 8 g 0   amphetamine-dextroamphetamine (ADDERALL) 20 MG tablet Take 1 tablet (20 mg total) by mouth daily. 30 tablet 0   amphetamine-dextroamphetamine (ADDERALL) 20 MG tablet Take 1 tablet (20 mg total) by mouth daily. 30 tablet 0   amphetamine-dextroamphetamine (ADDERALL) 20 MG tablet Take 1 tablet (20 mg total) by mouth daily. 30 tablet 0   amphetamine-dextroamphetamine (ADDERALL) 30 MG tablet Take 1 tablet by mouth 2 (two) times daily. 60 tablet 0   amphetamine-dextroamphetamine (ADDERALL) 30 MG tablet Take 1 tablet by mouth 2 (two) times daily. 60 tablet 0   amphetamine-dextroamphetamine (ADDERALL) 30 MG tablet Take 1 tablet by mouth  2 (two) times daily. 60 tablet 0   ARIPiprazole (ABILIFY) 10 MG tablet Take 1 tablet (10 mg total) by mouth 2 (two) times daily. 60 tablet 5   benzonatate (TESSALON) 100 MG capsule Take 1 capsule (100 mg total) by mouth 3 (three) times daily as needed for cough. 30 capsule 0   buPROPion (WELLBUTRIN XL) 150 MG 24 hr tablet Take one tablet every morning for 7 days,  then increase to two tablets every morning. 60 tablet 5   busPIRone (BUSPAR) 10 MG tablet Take 1 tablet (10 mg total) by mouth 3 (three) times daily. 90 tablet 5   clonazePAM (KLONOPIN) 1 MG tablet TAKE ONE TABLET BY MOUTH FOUR TIMES A DAY AS NEEDED FOR ANXIETY 120 tablet 2   cloNIDine (CATAPRES) 0.1 MG tablet Take one tablet at bedtime. 30 tablet 2   cyclobenzaprine (FLEXERIL) 5 MG tablet Take 1 tablet (5 mg total) by mouth 3 (three) times daily. 21 tablet 0   ibuprofen (ADVIL) 600 MG tablet Take 1 tablet (600 mg total) by mouth 3 (three) times daily. 21 tablet 0   lithium carbonate 150 MG capsule Take 1 capsule (150 mg total) by mouth at bedtime. 30 capsule 2   ondansetron (ZOFRAN-ODT) 4 MG disintegrating tablet 4mg  ODT q4 hours prn nausea/vomit 12 tablet 0   oxyCODONE-acetaminophen (PERCOCET/ROXICET) 5-325 MG tablet Take 1 tablet by mouth every 6 (six) hours as needed. 20 tablet 0   pantoprazole (PROTONIX) 20 MG tablet Take 1 tablet (20 mg total) by mouth daily. 30 tablet 0   traZODone (DESYREL) 50 MG tablet Take 1 tablet (50 mg total) by mouth at bedtime. 30 tablet 2   No current facility-administered medications for this visit.    Medication Side Effects: None  Allergies:  Allergies  Allergen Reactions   Hydroxyzine Other (See Comments)    Dizziness, mental fogginess, nausea   Hydrocodone-Acetaminophen Hives, Itching and Rash    Past Medical History:  Diagnosis Date   Anxiety    Chicken pox    Headache    due to CSF leak   Heart murmur    "as a child"   Spinal stenosis     Past Medical History, Surgical history, Social history, and Family history were reviewed and updated as appropriate.   Please see review of systems for further details on the patient's review from today.   Objective:   Physical Exam:  There were no vitals taken for this visit.  Physical Exam Constitutional:      General: He is not in acute distress. Musculoskeletal:        General: No  deformity.  Neurological:     Mental Status: He is alert and oriented to person, place, and time.     Coordination: Coordination normal.  Psychiatric:        Attention and Perception: Attention and perception normal. He does not perceive auditory or visual hallucinations.        Mood and Affect: Mood is anxious and depressed. Affect is flat. Affect is not labile, blunt, angry or inappropriate.        Speech: Speech normal.        Behavior: Behavior normal.        Thought Content: Thought content normal. Thought content is not paranoid or delusional. Thought content does not include homicidal or suicidal ideation. Thought content does not include homicidal or suicidal plan.        Cognition and Memory: Cognition and memory normal.  Judgment: Judgment normal.     Comments: Insight intact     Lab Review:     Component Value Date/Time   NA 138 11/23/2021 0740   K 4.1 11/23/2021 0740   CL 104 11/23/2021 0740   CO2 27 11/23/2021 0740   GLUCOSE 113 (H) 11/23/2021 0740   BUN 14 11/23/2021 0740   CREATININE 1.00 11/23/2021 0740   CALCIUM 8.6 (L) 11/23/2021 0740   PROT 7.2 11/23/2021 0740   ALBUMIN 4.0 11/23/2021 0740   AST 25 11/23/2021 0740   ALT 24 11/23/2021 0740   ALKPHOS 74 11/23/2021 0740   BILITOT 0.6 11/23/2021 0740   GFRNONAA >60 11/23/2021 0740   GFRAA >60 06/23/2020 0620       Component Value Date/Time   WBC 7.2 11/23/2021 0740   RBC 4.93 11/23/2021 0740   HGB 15.4 11/23/2021 0740   HCT 46.5 11/23/2021 0740   PLT 205 11/23/2021 0740   MCV 94.3 11/23/2021 0740   MCH 31.2 11/23/2021 0740   MCHC 33.1 11/23/2021 0740   RDW 13.2 11/23/2021 0740   LYMPHSABS 0.7 11/23/2021 0740   MONOABS 0.5 11/23/2021 0740   EOSABS 0.3 11/23/2021 0740   BASOSABS 0.0 11/23/2021 0740    No results found for: "POCLITH", "LITHIUM"   No results found for: "PHENYTOIN", "PHENOBARB", "VALPROATE", "CBMZ"   .res Assessment: Plan:    Plan:  Abilify 20mg  daily to target mood  instability.  Wellbutrin XL 300mg  daily - denies seizure history. Buspar 10mg  TID   Lithium 150mg  at bedtime - to target passive SI. Lab slips given.  Klonopin 1mg  4 times daily - typically taking twice daily for anxiety attacks - last filled 6 weeks ago.  Adderall 30mg  BID  Adderall 20mg  in the afternoon for work - evening shift  Increase Trazadone 50mg  to 150mg  at bedtime.  Patient out of work 05/30/2023 through 06/29/2023. Will reassess a return to work date at next appointment on 04/29/2023.  ADHD outside testing - tested 90 - adult ADHD in office today. Consider referral for psychological testing.  RTC 4 weeks  Discussed service animal  Monitor BP between visits  Discussed potential metabolic side effects associated with atypical antipsychotics, as well as potential risk for movement side effects. Advised pt to contact office if movement side effects occur.   Discussed potential benefits, risk, and side effects of benzodiazepines to include potential risk of tolerance and dependence, as well as possible drowsiness.  Advised patient not to drive if experiencing drowsiness and to take lowest possible effective dose to minimize risk of dependence and tolerance.   Discussed potential benefits, risks, and side effects of stimulants with patient to include increased heart rate, palpitations, insomnia, increased anxiety, increased irritability, or decreased appetite.  Instructed patient to contact office if experiencing any significant tolerability issues.   Diagnoses and all orders for this visit:  Bipolar I disorder (HCC)  Generalized anxiety disorder  Attention deficit hyperactivity disorder (ADHD), unspecified ADHD type  Insomnia, unspecified type     Please see After Visit Summary for patient specific instructions.  No future appointments.   No orders of the defined types were placed in this encounter.   -------------------------------

## 2023-06-01 ENCOUNTER — Other Ambulatory Visit: Payer: Self-pay | Admitting: Adult Health

## 2023-06-01 DIAGNOSIS — F319 Bipolar disorder, unspecified: Secondary | ICD-10-CM

## 2023-06-01 DIAGNOSIS — F411 Generalized anxiety disorder: Secondary | ICD-10-CM

## 2023-06-01 DIAGNOSIS — G4700 Insomnia, unspecified: Secondary | ICD-10-CM

## 2023-06-01 DIAGNOSIS — F909 Attention-deficit hyperactivity disorder, unspecified type: Secondary | ICD-10-CM

## 2023-06-20 ENCOUNTER — Telehealth: Payer: Self-pay | Admitting: Adult Health

## 2023-06-20 ENCOUNTER — Other Ambulatory Visit: Payer: Self-pay

## 2023-06-20 DIAGNOSIS — F909 Attention-deficit hyperactivity disorder, unspecified type: Secondary | ICD-10-CM

## 2023-06-20 MED ORDER — AMPHETAMINE-DEXTROAMPHETAMINE 20 MG PO TABS: 20.0000 mg | ORAL_TABLET | Freq: Every day | ORAL | 0 refills | Status: DC

## 2023-06-20 MED ORDER — AMPHETAMINE-DEXTROAMPHETAMINE 30 MG PO TABS: 30.0000 mg | ORAL_TABLET | Freq: Two times a day (BID) | ORAL | 0 refills | Status: DC

## 2023-06-20 NOTE — Telephone Encounter (Addendum)
Please verify last RF on both Adderall 20 and 30 mg.

## 2023-06-20 NOTE — Telephone Encounter (Signed)
Pended.

## 2023-06-20 NOTE — Telephone Encounter (Signed)
Pt's fiance called in to check on RF on Adderall 20mg  and Adderall 30mg .  Please send in next RX RF for both to [facility]: [facility] [id number] - [place], [place] - [address],

## 2023-06-20 NOTE — Telephone Encounter (Signed)
LF 05/01/23 (BOTH 30 AND 20MG ); LV 05/30/23; NV 06/27/23

## 2023-06-27 ENCOUNTER — Ambulatory Visit: Payer: 59 | Admitting: Adult Health

## 2023-06-29 ENCOUNTER — Telehealth (INDEPENDENT_AMBULATORY_CARE_PROVIDER_SITE_OTHER): Payer: 59 | Admitting: Adult Health

## 2023-06-29 DIAGNOSIS — F319 Bipolar disorder, unspecified: Secondary | ICD-10-CM | POA: Diagnosis not present

## 2023-06-29 DIAGNOSIS — F909 Attention-deficit hyperactivity disorder, unspecified type: Secondary | ICD-10-CM

## 2023-06-29 DIAGNOSIS — F411 Generalized anxiety disorder: Secondary | ICD-10-CM | POA: Diagnosis not present

## 2023-06-29 DIAGNOSIS — G4700 Insomnia, unspecified: Secondary | ICD-10-CM

## 2023-07-04 ENCOUNTER — Encounter: Payer: Self-pay | Admitting: Adult Health

## 2023-07-04 NOTE — Progress Notes (Signed)
Jay Montgomery [id number] 08/05/1986 37 y.o.  Virtual Visit via Video Note  I connected with pt @ on 06/29/23 at 11:40 AM EDT by a video enabled telemedicine application and verified that I am speaking with the correct person using two identifiers.   I discussed the limitations of evaluation and management by telemedicine and the availability of in person appointments. The patient expressed understanding and agreed to proceed.  I discussed the assessment and treatment plan with the patient. The patient was provided an opportunity to ask questions and all were answered. The patient agreed with the plan and demonstrated an understanding of the instructions.   The patient was advised to call back or seek an in-person evaluation if the symptoms worsen or if the condition fails to improve as anticipated.  I provided 25 minutes of non-face-to-face time during this encounter.  The patient was located at home.  The provider was located at [facility].   Jay Gibbs, NP   Subjective:   Patient ID:  Jay Montgomery is a 37 y.o. (DOB June 09, 1986) male.  Chief Complaint: No chief complaint on file.   HPI Jay Montgomery presents for follow-up of GAD, insomnia, ADHD, and BPD 1.  Received short term disability benefits. Recently terminated by employer.Reports he has filled for long term disability.  Describes mood today as "not too good". Pleasant. Denies tearfulness. Mood symptoms - reports decreased depression and irritability. Reports anxiety throughout the day. Reports 3 panic attacks within the past week - mostly in the morning when he first wakes up. Denies worry, rumination and over thinking. Stating "I feel like I'm about the same" - "just going through the motions". Varying interest and motivation. Taking medications as prescribed, but is willing to consider other options.. Energy levels vary. Active, does not have a regular exercise routine. Enjoys some usual  interests and activities. Married. Lives with  Appetite adequate. Weight stable. Sleeping better some nights than others. Averages 5 to 6 hours over the weekend. Focus and concentration stable. Completing tasks. Managing aspects of household. Disabled. Denies SI or HI. Denies AH or VH. Denies self harm. Denies substance use.  Review of Systems:   Review of Systems  Musculoskeletal:  Negative for gait problem.  Neurological:  Negative for tremors.  Psychiatric/Behavioral:         Please refer to HPI    Medications: I have reviewed the patient's current medications.  Current Outpatient Medications  Medication Sig Dispense Refill   albuterol (VENTOLIN HFA) 108 (90 Base) MCG/ACT inhaler Inhale 2 puffs into the lungs every 6 (six) hours as needed for wheezing or shortness of breath. 8 g 0   amphetamine-dextroamphetamine (ADDERALL) 20 MG tablet Take 1 tablet (20 mg total) by mouth daily. 30 tablet 0   amphetamine-dextroamphetamine (ADDERALL) 20 MG tablet Take 1 tablet (20 mg total) by mouth daily. 30 tablet 0   amphetamine-dextroamphetamine (ADDERALL) 20 MG tablet Take 1 tablet (20 mg total) by mouth daily. 30 tablet 0   amphetamine-dextroamphetamine (ADDERALL) 30 MG tablet Take 1 tablet by mouth 2 (two) times daily. 60 tablet 0   amphetamine-dextroamphetamine (ADDERALL) 30 MG tablet Take 1 tablet by mouth 2 (two) times daily. 60 tablet 0   amphetamine-dextroamphetamine (ADDERALL) 30 MG tablet Take 1 tablet by mouth 2 (two) times daily. 60 tablet 0   ARIPiprazole (ABILIFY) 10 MG tablet Take 1 tablet (10 mg total) by mouth 2 (two) times daily. 60 tablet 5   benzonatate (TESSALON) 100 MG capsule Take 1  capsule (100 mg total) by mouth 3 (three) times daily as needed for cough. 30 capsule 0   buPROPion (WELLBUTRIN XL) 150 MG 24 hr tablet Take one tablet every morning for 7 days, then increase to two tablets every morning. 60 tablet 5   busPIRone (BUSPAR) 10 MG tablet Take 1 tablet (10 mg total)  by mouth 3 (three) times daily. 90 tablet 5   clonazePAM (KLONOPIN) 1 MG tablet TAKE 1 TABLET BY MOUTH FOUR TIMES A DAY AS NEEDED FOR ANXIETY. 120 tablet 0   cloNIDine (CATAPRES) 0.1 MG tablet TAKE 1 TABLET BY MOUTH AT BEDTIME 90 tablet 0   cyclobenzaprine (FLEXERIL) 5 MG tablet Take 1 tablet (5 mg total) by mouth 3 (three) times daily. 21 tablet 0   ibuprofen (ADVIL) 600 MG tablet Take 1 tablet (600 mg total) by mouth 3 (three) times daily. 21 tablet 0   lithium carbonate 150 MG capsule TAKE 1 CAPSULE BY MOUTH AT BEDTIME 30 capsule 2   ondansetron (ZOFRAN-ODT) 4 MG disintegrating tablet 4mg  ODT q4 hours prn nausea/vomit 12 tablet 0   oxyCODONE-acetaminophen (PERCOCET/ROXICET) 5-325 MG tablet Take 1 tablet by mouth every 6 (six) hours as needed. 20 tablet 0   pantoprazole (PROTONIX) 20 MG tablet Take 1 tablet (20 mg total) by mouth daily. 30 tablet 0   traZODone (DESYREL) 50 MG tablet Take 1-3 tablets (50-150 mg total) by mouth at bedtime. 90 tablet 0   No current facility-administered medications for this visit.    Medication Side Effects: None  Allergies:  Allergies  Allergen Reactions   Hydroxyzine Other (See Comments)    Dizziness, mental fogginess, nausea   Hydrocodone-Acetaminophen Hives, Itching and Rash    Past Medical History:  Diagnosis Date   Anxiety    Chicken pox    Headache    due to CSF leak   Heart murmur    "as a child"   Spinal stenosis     Family History  Problem Relation Age of Onset   Arthritis Mother    Hypertension Mother    COPD Mother    Emphysema Mother    Bipolar disorder Mother    Arthritis Father    Diabetes Father    Bipolar disorder Sister    Schizophrenia Sister     Social History   Socioeconomic History   Marital status: Legally Separated    Spouse name: Not on file   Number of children: Not on file   Years of education: Not on file   Highest education level: Not on file  Occupational History   Occupation: Designer, jewellery   Tobacco Use   Smoking status: Every Day    Current packs/day: 1.00    Types: Cigarettes   Smokeless tobacco: Never  Vaping Use   Vaping status: Former  Substance and Sexual Activity   Alcohol use: Not Currently    Comment: occassional   Drug use: No   Sexual activity: Yes    Partners: Female    Comment: wife  Other Topics Concern   Not on file  Social History Narrative   Not on file   Social Determinants of Health   Financial Resource Strain: Not on file  Food Insecurity: Not on file  Transportation Needs: Not on file  Physical Activity: Not on file  Stress: Not on file  Social Connections: Unknown (06/20/2023)   Received from [facility]   Social Network    Social Network: Not on file  Intimate Partner Violence: Unknown (06/20/2023)  Received from [facility]   HITS    Physically Hurt: Not on file    Insult or Talk Down To: Not on file    Threaten Physical Harm: Not on file    Scream or Curse: Not on file    Past Medical History, Surgical history, Social history, and Family history were reviewed and updated as appropriate.   Please see review of systems for further details on the patient's review from today.   Objective:   Physical Exam:  There were no vitals taken for this visit.  Physical Exam Constitutional:      General: He is not in acute distress. Musculoskeletal:        General: No deformity.  Neurological:     Mental Status: He is alert and oriented to person, place, and time.     Coordination: Coordination normal.  Psychiatric:        Attention and Perception: Attention and perception normal. He does not perceive auditory or visual hallucinations.        Mood and Affect: Mood normal. Mood is not anxious or depressed. Affect is not labile, blunt, angry or inappropriate.        Speech: Speech normal.        Behavior: Behavior normal.        Thought Content: Thought content normal. Thought content is not paranoid or delusional. Thought content  does not include homicidal or suicidal ideation. Thought content does not include homicidal or suicidal plan.        Cognition and Memory: Cognition and memory normal.        Judgment: Judgment normal.     Comments: Insight intact     Lab Review:     Component Value Date/Time   NA 138 11/23/2021 0740   K 4.1 11/23/2021 0740   CL 104 11/23/2021 0740   CO2 27 11/23/2021 0740   GLUCOSE 113 (H) 11/23/2021 0740   BUN 14 11/23/2021 0740   CREATININE 1.00 11/23/2021 0740   CALCIUM 8.6 (L) 11/23/2021 0740   PROT 7.2 11/23/2021 0740   ALBUMIN 4.0 11/23/2021 0740   AST 25 11/23/2021 0740   ALT 24 11/23/2021 0740   ALKPHOS 74 11/23/2021 0740   BILITOT 0.6 11/23/2021 0740   GFRNONAA >60 11/23/2021 0740   GFRAA >60 06/23/2020 0620       Component Value Date/Time   WBC 7.2 11/23/2021 0740   RBC 4.93 11/23/2021 0740   HGB 15.4 11/23/2021 0740   HCT 46.5 11/23/2021 0740   PLT 205 11/23/2021 0740   MCV 94.3 11/23/2021 0740   MCH 31.2 11/23/2021 0740   MCHC 33.1 11/23/2021 0740   RDW 13.2 11/23/2021 0740   LYMPHSABS 0.7 11/23/2021 0740   MONOABS 0.5 11/23/2021 0740   EOSABS 0.3 11/23/2021 0740   BASOSABS 0.0 11/23/2021 0740    No results found for: "POCLITH", "LITHIUM"   No results found for: "PHENYTOIN", "PHENOBARB", "VALPROATE", "CBMZ"   .res Assessment: Plan:    Plan:  Abilify 20mg  daily to target mood instability.  Wellbutrin XL 300mg  daily - denies seizure history. Buspar 10mg  TID   Lithium 150mg  at bedtime - to target passive SI. Discussed labs.  Klonopin 1mg  4 times daily - typically taking twice daily for anxiety attacks - last filled 6 weeks ago.  Adderall 30mg  BID  Adderall 20mg  in the afternoon for work - evening shift  Trazadone 150mg  at bedtime.  ADHD outside testing - tested 52 - adult ADHD in office today. Consider referral  for psychological testing.  RTC 4 weeks  Discussed service animal  Monitor BP between visits  Discussed potential metabolic  side effects associated with atypical antipsychotics, as well as potential risk for movement side effects. Advised pt to contact office if movement side effects occur.   Discussed potential benefits, risk, and side effects of benzodiazepines to include potential risk of tolerance and dependence, as well as possible drowsiness.  Advised patient not to drive if experiencing drowsiness and to take lowest possible effective dose to minimize risk of dependence and tolerance.   Discussed potential benefits, risks, and side effects of stimulants with patient to include increased heart rate, palpitations, insomnia, increased anxiety, increased irritability, or decreased appetite.  Instructed patient to contact office if experiencing any significant tolerability issues.   Diagnoses and all orders for this visit:  Bipolar I disorder (HCC)  Generalized anxiety disorder  Attention deficit hyperactivity disorder (ADHD), unspecified ADHD type  Insomnia, unspecified type     Please see After Visit Summary for patient specific instructions.  No future appointments.  No orders of the defined types were placed in this encounter.     -------------------------------

## 2023-07-26 ENCOUNTER — Other Ambulatory Visit: Payer: Self-pay | Admitting: Adult Health

## 2023-07-26 DIAGNOSIS — G4700 Insomnia, unspecified: Secondary | ICD-10-CM

## 2023-07-26 DIAGNOSIS — F411 Generalized anxiety disorder: Secondary | ICD-10-CM

## 2023-07-29 ENCOUNTER — Encounter: Payer: Self-pay | Admitting: Adult Health

## 2023-07-29 ENCOUNTER — Telehealth (INDEPENDENT_AMBULATORY_CARE_PROVIDER_SITE_OTHER): Payer: No Typology Code available for payment source | Admitting: Adult Health

## 2023-07-29 DIAGNOSIS — G4700 Insomnia, unspecified: Secondary | ICD-10-CM

## 2023-07-29 DIAGNOSIS — F319 Bipolar disorder, unspecified: Secondary | ICD-10-CM

## 2023-07-29 DIAGNOSIS — F411 Generalized anxiety disorder: Secondary | ICD-10-CM

## 2023-07-29 DIAGNOSIS — F909 Attention-deficit hyperactivity disorder, unspecified type: Secondary | ICD-10-CM | POA: Diagnosis not present

## 2023-07-29 MED ORDER — AMPHETAMINE-DEXTROAMPHETAMINE 30 MG PO TABS: 30.0000 mg | ORAL_TABLET | Freq: Two times a day (BID) | ORAL | 0 refills | Status: DC

## 2023-07-29 MED ORDER — CLONAZEPAM 1 MG PO TABS: ORAL_TABLET | ORAL | 2 refills | Status: DC

## 2023-07-29 NOTE — Progress Notes (Signed)
Jay Montgomery [id number] [birth year] 36 y.o.  Virtual Visit via Video Note  I connected with pt @ on 07/29/23 at 12:00 PM EST by a video enabled telemedicine application and verified that I am speaking with the correct person using two identifiers.   I discussed the limitations of evaluation and management by telemedicine and the availability of in person appointments. The patient expressed understanding and agreed to proceed.  I discussed the assessment and treatment plan with the patient. The patient was provided an opportunity to ask questions and all were answered. The patient agreed with the plan and demonstrated an understanding of the instructions.   The patient was advised to call back or seek an in-person evaluation if the symptoms worsen or if the condition fails to improve as anticipated.  I provided 25 minutes of non-face-to-face time during this encounter.  The patient was located at home.  The provider was located at [facility].   Jay Gibbs, NP   Subjective:   Patient ID:  Jay Montgomery is a 37 y.o. (DOB 11-Nov-1985) male.  Chief Complaint: No chief complaint on file.   HPI Jay Montgomery presents for follow-up of GAD, insomnia, ADHD, and BPD 1.  Received short term disability benefits. Was then terminated by employer. Reports he has filled for long term disability.  Recent medical exam for disability - through a state physician.  Describes mood today as "about the same". Pleasant. Denies tearfulness. Mood symptoms - reports some depression. Denies irritability - "can get angry quickly". Reports anxiety throughout the day. Reports one recent panic attack. Reports worry, rumination and over thinking. Stating "I feel stressed and worried all the time". Varying interest and motivation. Taking medications as prescribed, but is willing to consider other options.. Energy levels varies - "depends on the day". Active, does not have a regular  exercise routine. Enjoys some usual interests and activities. Married. Lives with wife and family. Appetite adequate. Weight stable - 229 pounds Reports sleeping difficulties. Averages 5 to 6 hours to up to 18 hours.  Focus and concentration stable - with Adderall. Completing tasks. Managing aspects of household. Disabled. Denies SI or HI. Denies AH or VH. Denies self harm. Denies substance use.   Review of Systems:  Review of Systems  Musculoskeletal:  Negative for gait problem.  Neurological:  Negative for tremors.  Psychiatric/Behavioral:         Please refer to HPI    Medications: I have reviewed the patient's current medications.  Current Outpatient Medications  Medication Sig Dispense Refill   albuterol (VENTOLIN HFA) 108 (90 Base) MCG/ACT inhaler Inhale 2 puffs into the lungs every 6 (six) hours as needed for wheezing or shortness of breath. 8 g 0   amphetamine-dextroamphetamine (ADDERALL) 20 MG tablet Take 1 tablet (20 mg total) by mouth daily. 30 tablet 0   amphetamine-dextroamphetamine (ADDERALL) 20 MG tablet Take 1 tablet (20 mg total) by mouth daily. 30 tablet 0   amphetamine-dextroamphetamine (ADDERALL) 20 MG tablet Take 1 tablet (20 mg total) by mouth daily. 30 tablet 0   amphetamine-dextroamphetamine (ADDERALL) 30 MG tablet Take 1 tablet by mouth 2 (two) times daily. 60 tablet 0   amphetamine-dextroamphetamine (ADDERALL) 30 MG tablet Take 1 tablet by mouth 2 (two) times daily. 60 tablet 0   amphetamine-dextroamphetamine (ADDERALL) 30 MG tablet Take 1 tablet by mouth 2 (two) times daily. 60 tablet 0   ARIPiprazole (ABILIFY) 10 MG tablet Take 1 tablet (10 mg total) by mouth 2 (two) times  daily. 60 tablet 5   benzonatate (TESSALON) 100 MG capsule Take 1 capsule (100 mg total) by mouth 3 (three) times daily as needed for cough. 30 capsule 0   buPROPion (WELLBUTRIN XL) 150 MG 24 hr tablet Take one tablet every morning for 7 days, then increase to two tablets every morning.  60 tablet 5   busPIRone (BUSPAR) 10 MG tablet Take 1 tablet (10 mg total) by mouth 3 (three) times daily. 90 tablet 5   clonazePAM (KLONOPIN) 1 MG tablet TAKE 1 TABLET BY MOUTH 4 TIMES A DAY AS NEEDED FOR ANXIETY 120 tablet 0   cloNIDine (CATAPRES) 0.1 MG tablet TAKE 1 TABLET BY MOUTH AT BEDTIME 90 tablet 0   cyclobenzaprine (FLEXERIL) 5 MG tablet Take 1 tablet (5 mg total) by mouth 3 (three) times daily. 21 tablet 0   ibuprofen (ADVIL) 600 MG tablet Take 1 tablet (600 mg total) by mouth 3 (three) times daily. 21 tablet 0   lithium carbonate 150 MG capsule TAKE 1 CAPSULE BY MOUTH AT BEDTIME 30 capsule 2   ondansetron (ZOFRAN-ODT) 4 MG disintegrating tablet 4mg  ODT q4 hours prn nausea/vomit 12 tablet 0   oxyCODONE-acetaminophen (PERCOCET/ROXICET) 5-325 MG tablet Take 1 tablet by mouth every 6 (six) hours as needed. 20 tablet 0   pantoprazole (PROTONIX) 20 MG tablet Take 1 tablet (20 mg total) by mouth daily. 30 tablet 0   traZODone (DESYREL) 50 MG tablet TAKE 1 TO 3 TABLETS BY MOUTH EVERY NIGHT AT BEDTIME 90 tablet 0   No current facility-administered medications for this visit.    Medication Side Effects: None  Allergies:  Allergies  Allergen Reactions   Hydroxyzine Other (See Comments)    Dizziness, mental fogginess, nausea   Hydrocodone-Acetaminophen Hives, Itching and Rash    Past Medical History:  Diagnosis Date   Anxiety    Chicken pox    Headache    due to CSF leak   Heart murmur    "as a child"   Spinal stenosis     Family History  Problem Relation Age of Onset   Arthritis Mother    Hypertension Mother    COPD Mother    Emphysema Mother    Bipolar disorder Mother    Arthritis Father    Diabetes Father    Bipolar disorder Sister    Schizophrenia Sister     Social History   Socioeconomic History   Marital status: Legally Separated    Spouse name: Not on file   Number of children: Not on file   Years of education: Not on file   Highest education level: Not  on file  Occupational History   Occupation: Designer, jewellery  Tobacco Use   Smoking status: Every Day    Current packs/day: 1.00    Types: Cigarettes   Smokeless tobacco: Never  Vaping Use   Vaping status: Former  Substance and Sexual Activity   Alcohol use: Not Currently    Comment: occassional   Drug use: No   Sexual activity: Yes    Partners: Female    Comment: wife  Other Topics Concern   Not on file  Social History Narrative   Not on file   Social Determinants of Health   Financial Resource Strain: Not on file  Food Insecurity: Not on file  Transportation Needs: Not on file  Physical Activity: Not on file  Stress: Not on file  Social Connections: Unknown (06/20/2023)   Received from [employer]   Social Network  Social Network: Not on file  Intimate Partner Violence: Unknown (06/20/2023)   Received from [facility]   HITS    Physically Hurt: Not on file    Insult or Talk Down To: Not on file    Threaten Physical Harm: Not on file    Scream or Curse: Not on file    Past Medical History, Surgical history, Social history, and Family history were reviewed and updated as appropriate.   Please see review of systems for further details on the patient's review from today.   Objective:   Physical Exam:  There were no vitals taken for this visit.  Physical Exam Constitutional:      General: He is not in acute distress. Musculoskeletal:        General: No deformity.  Neurological:     Mental Status: He is alert and oriented to person, place, and time.     Coordination: Coordination normal.  Psychiatric:        Attention and Perception: Attention and perception normal. He does not perceive auditory or visual hallucinations.        Mood and Affect: Affect is not labile, blunt, angry or inappropriate.        Speech: Speech normal.        Behavior: Behavior normal.        Thought Content: Thought content normal. Thought content is not paranoid or delusional.  Thought content does not include homicidal or suicidal ideation. Thought content does not include homicidal or suicidal plan.        Cognition and Memory: Cognition and memory normal.        Judgment: Judgment normal.     Comments: Insight intact     Lab Review:     Component Value Date/Time   NA 138 11/23/2021 0740   K 4.1 11/23/2021 0740   CL 104 11/23/2021 0740   CO2 27 11/23/2021 0740   GLUCOSE 113 (H) 11/23/2021 0740   BUN 14 11/23/2021 0740   CREATININE 1.00 11/23/2021 0740   CALCIUM 8.6 (L) 11/23/2021 0740   PROT 7.2 11/23/2021 0740   ALBUMIN 4.0 11/23/2021 0740   AST 25 11/23/2021 0740   ALT 24 11/23/2021 0740   ALKPHOS 74 11/23/2021 0740   BILITOT 0.6 11/23/2021 0740   GFRNONAA >60 11/23/2021 0740   GFRAA >60 06/23/2020 0620       Component Value Date/Time   WBC 7.2 11/23/2021 0740   RBC 4.93 11/23/2021 0740   HGB 15.4 11/23/2021 0740   HCT 46.5 11/23/2021 0740   PLT 205 11/23/2021 0740   MCV 94.3 11/23/2021 0740   MCH 31.2 11/23/2021 0740   MCHC 33.1 11/23/2021 0740   RDW 13.2 11/23/2021 0740   LYMPHSABS 0.7 11/23/2021 0740   MONOABS 0.5 11/23/2021 0740   EOSABS 0.3 11/23/2021 0740   BASOSABS 0.0 11/23/2021 0740    No results found for: "POCLITH", "LITHIUM"   No results found for: "PHENYTOIN", "PHENOBARB", "VALPROATE", "CBMZ"   .res Assessment: Plan:    Plan:  Abilify 20mg  daily to target mood instability.  Wellbutrin XL 300mg  daily - denies seizure history. Buspar 10mg  TID   Lithium 150mg  at bedtime - to target passive SI. Discussed labs.  Klonopin 1mg  4 times daily - typically taking twice daily for anxiety attacks - last filled 6 weeks ago.  Adderall 30mg  BID  D/C Adderall 20mg  in the afternoon   Trazadone 150mg  at bedtime.  ADHD outside testing - tested 51 - adult ADHD in office today.  Consider referral for psychological testing.  RTC 4 weeks  Discussed service animal  Monitor BP between visits  Discussed potential metabolic  side effects associated with atypical antipsychotics, as well as potential risk for movement side effects. Advised pt to contact office if movement side effects occur.   Discussed potential benefits, risk, and side effects of benzodiazepines to include potential risk of tolerance and dependence, as well as possible drowsiness.  Advised patient not to drive if experiencing drowsiness and to take lowest possible effective dose to minimize risk of dependence and tolerance.   Discussed potential benefits, risks, and side effects of stimulants with patient to include increased heart rate, palpitations, insomnia, increased anxiety, increased irritability, or decreased appetite.  Instructed patient to contact office if experiencing any significant tolerability issues.  There are no diagnoses linked to this encounter.   Please see After Visit Summary for patient specific instructions.  Future Appointments  Date Time Provider Department Center  07/29/2023 12:00 PM Myrel Rappleye, Thereasa Solo, NP [facility] None    No orders of the defined types were placed in this encounter.     -------------------------------

## 2023-08-10 ENCOUNTER — Encounter (HOSPITAL_COMMUNITY): Payer: Self-pay

## 2023-08-10 ENCOUNTER — Emergency Department (HOSPITAL_COMMUNITY): Payer: Medicaid Other

## 2023-08-10 ENCOUNTER — Observation Stay (HOSPITAL_COMMUNITY)
Admission: EM | Admit: 2023-08-10 | Discharge: 2023-08-11 | Disposition: A | Discharge status: Home / Self Care | Payer: Medicaid Other | Attending: Internal Medicine | Admitting: Internal Medicine

## 2023-08-10 ENCOUNTER — Other Ambulatory Visit: Payer: Self-pay

## 2023-08-10 ENCOUNTER — Observation Stay (HOSPITAL_COMMUNITY): Payer: Medicaid Other

## 2023-08-10 DIAGNOSIS — I10 Essential (primary) hypertension: Secondary | ICD-10-CM | POA: Insufficient documentation

## 2023-08-10 DIAGNOSIS — G4483 Primary cough headache: Principal | ICD-10-CM

## 2023-08-10 DIAGNOSIS — F909 Attention-deficit hyperactivity disorder, unspecified type: Secondary | ICD-10-CM | POA: Diagnosis not present

## 2023-08-10 DIAGNOSIS — F411 Generalized anxiety disorder: Secondary | ICD-10-CM | POA: Diagnosis present

## 2023-08-10 DIAGNOSIS — F1090 Alcohol use, unspecified, uncomplicated: Secondary | ICD-10-CM | POA: Diagnosis not present

## 2023-08-10 DIAGNOSIS — F319 Bipolar disorder, unspecified: Secondary | ICD-10-CM | POA: Insufficient documentation

## 2023-08-10 DIAGNOSIS — Z79899 Other long term (current) drug therapy: Secondary | ICD-10-CM | POA: Diagnosis not present

## 2023-08-10 DIAGNOSIS — Z87891 Personal history of nicotine dependence: Secondary | ICD-10-CM | POA: Diagnosis not present

## 2023-08-10 DIAGNOSIS — G4700 Insomnia, unspecified: Secondary | ICD-10-CM | POA: Insufficient documentation

## 2023-08-10 DIAGNOSIS — F331 Major depressive disorder, recurrent, moderate: Secondary | ICD-10-CM | POA: Diagnosis present

## 2023-08-10 DIAGNOSIS — I67848 Other cerebrovascular vasospasm and vasoconstriction: Principal | ICD-10-CM | POA: Diagnosis present

## 2023-08-10 DIAGNOSIS — G4453 Primary thunderclap headache: Secondary | ICD-10-CM | POA: Diagnosis not present

## 2023-08-10 DIAGNOSIS — Q323 Congenital stenosis of bronchus: Secondary | ICD-10-CM | POA: Insufficient documentation

## 2023-08-10 DIAGNOSIS — R519 Headache, unspecified: Secondary | ICD-10-CM | POA: Diagnosis present

## 2023-08-10 LAB — I-STAT CHEM 8, ED
BUN: 13 mg/dL (ref 6–20)
Calcium, Ion: 1.16 mmol/L (ref 1.15–1.40)
Chloride: 105 mmol/L (ref 98–111)
Creatinine, Ser: 1 mg/dL (ref 0.61–1.24)
Glucose, Bld: 160 mg/dL — ABNORMAL HIGH (ref 70–99)
HCT: 50 % (ref 39.0–52.0)
Hemoglobin: 17 g/dL (ref 13.0–17.0)
Potassium: 4 mmol/L (ref 3.5–5.1)
Sodium: 140 mmol/L (ref 135–145)
TCO2: 22 mmol/L (ref 22–32)

## 2023-08-10 LAB — CBC WITH DIFFERENTIAL/PLATELET
Abs Immature Granulocytes: 0.09 10*3/uL — ABNORMAL HIGH (ref 0.00–0.07)
Basophils Absolute: 0 10*3/uL (ref 0.0–0.1)
Basophils Relative: 1 %
Eosinophils Absolute: 0.4 10*3/uL (ref 0.0–0.5)
Eosinophils Relative: 5 %
HCT: 51.1 % (ref 39.0–52.0)
Hemoglobin: 16.7 g/dL (ref 13.0–17.0)
Immature Granulocytes: 1 %
Lymphocytes Relative: 25 %
Lymphs Abs: 2 10*3/uL (ref 0.7–4.0)
MCH: 30 pg (ref 26.0–34.0)
MCHC: 32.7 g/dL (ref 30.0–36.0)
MCV: 91.9 fL (ref 80.0–100.0)
Monocytes Absolute: 0.7 10*3/uL (ref 0.1–1.0)
Monocytes Relative: 9 %
Neutro Abs: 4.8 10*3/uL (ref 1.7–7.7)
Neutrophils Relative %: 59 %
Platelets: 247 10*3/uL (ref 150–400)
RBC: 5.56 MIL/uL (ref 4.22–5.81)
RDW: 13.6 % (ref 11.5–15.5)
WBC: 8 10*3/uL (ref 4.0–10.5)
nRBC: 0 % (ref 0.0–0.2)

## 2023-08-10 LAB — BASIC METABOLIC PANEL
Anion gap: 11 (ref 5–15)
BUN: 13 mg/dL (ref 6–20)
CO2: 22 mmol/L (ref 22–32)
Calcium: 9 mg/dL (ref 8.9–10.3)
Chloride: 105 mmol/L (ref 98–111)
Creatinine, Ser: 1.01 mg/dL (ref 0.61–1.24)
GFR, Estimated: >60 mL/min (ref >60)
Glucose, Bld: 160 mg/dL — ABNORMAL HIGH (ref 70–99)
Potassium: 3.9 mmol/L (ref 3.5–5.1)
Sodium: 138 mmol/L (ref 135–145)

## 2023-08-10 LAB — RAPID URINE DRUG SCREEN, HOSP PERFORMED
Amphetamines: NOT DETECTED
Barbiturates: NOT DETECTED
Benzodiazepines: NOT DETECTED
Cocaine: NOT DETECTED
Opiates: POSITIVE — AB
Tetrahydrocannabinol: POSITIVE — AB

## 2023-08-10 LAB — C-REACTIVE PROTEIN: CRP: 1.3 mg/dL — ABNORMAL HIGH (ref <1.0)

## 2023-08-10 LAB — SEDIMENTATION RATE: Sed Rate: 7 mm/h (ref 0–16)

## 2023-08-10 MED ORDER — VERAPAMIL HCL 40 MG PO TABS
40.0000 mg | ORAL_TABLET | Freq: Three times a day (TID) | ORAL | Status: DC
  Administered 2023-08-10 – 2023-08-11 (×4): 40 mg via ORAL
  Filled 2023-08-10 (×5): qty 1

## 2023-08-10 MED ORDER — ONDANSETRON HCL 4 MG PO TABS: 4.0000 mg | ORAL_TABLET | Freq: Four times a day (QID) | ORAL | Status: DC | PRN

## 2023-08-10 MED ORDER — ACETAMINOPHEN 650 MG RE SUPP: 650.0000 mg | Freq: Four times a day (QID) | RECTAL | Status: DC | PRN

## 2023-08-10 MED ORDER — MAGNESIUM SULFATE 2 GM/50ML IV SOLN
2.0000 g | Freq: Once | INTRAVENOUS | Status: AC
  Administered 2023-08-10: 2 g via INTRAVENOUS
  Filled 2023-08-10: qty 50

## 2023-08-10 MED ORDER — MORPHINE SULFATE (PF) 2 MG/ML IV SOLN
INTRAVENOUS | Status: AC
  Filled 2023-08-10: qty 1

## 2023-08-10 MED ORDER — METOCLOPRAMIDE HCL 5 MG/ML IJ SOLN
10.0000 mg | Freq: Once | INTRAMUSCULAR | Status: AC
  Administered 2023-08-10: 10 mg via INTRAVENOUS
  Filled 2023-08-10: qty 2

## 2023-08-10 MED ORDER — KETOROLAC TROMETHAMINE 15 MG/ML IJ SOLN
15.0000 mg | Freq: Once | INTRAMUSCULAR | Status: AC
  Administered 2023-08-10: 15 mg via INTRAVENOUS
  Filled 2023-08-10: qty 1

## 2023-08-10 MED ORDER — ONDANSETRON HCL 4 MG/2ML IJ SOLN: 4.0000 mg | Freq: Four times a day (QID) | INTRAMUSCULAR | Status: DC | PRN

## 2023-08-10 MED ORDER — KETOROLAC TROMETHAMINE 15 MG/ML IJ SOLN
15.0000 mg | Freq: Four times a day (QID) | INTRAMUSCULAR | Status: DC
  Administered 2023-08-10 – 2023-08-11 (×5): 15 mg via INTRAVENOUS
  Filled 2023-08-10 (×5): qty 1

## 2023-08-10 MED ORDER — DIPHENHYDRAMINE HCL 50 MG/ML IJ SOLN
25.0000 mg | Freq: Once | INTRAMUSCULAR | Status: AC
  Administered 2023-08-10: 25 mg via INTRAVENOUS
  Filled 2023-08-10: qty 1

## 2023-08-10 MED ORDER — TRAZODONE HCL 50 MG PO TABS: 50.0000 mg | ORAL_TABLET | Freq: Every evening | ORAL | Status: DC | PRN

## 2023-08-10 MED ORDER — SODIUM CHLORIDE 0.9% FLUSH
3.0000 mL | Freq: Two times a day (BID) | INTRAVENOUS | Status: DC
  Administered 2023-08-10 – 2023-08-11 (×2): 3 mL via INTRAVENOUS

## 2023-08-10 MED ORDER — BUSPIRONE HCL 10 MG PO TABS
10.0000 mg | ORAL_TABLET | Freq: Three times a day (TID) | ORAL | Status: DC
  Administered 2023-08-10: 10 mg via ORAL
  Filled 2023-08-10: qty 2

## 2023-08-10 MED ORDER — HYDRALAZINE HCL 20 MG/ML IJ SOLN
10.0000 mg | INTRAMUSCULAR | Status: DC | PRN
  Administered 2023-08-10: 10 mg via INTRAVENOUS
  Filled 2023-08-10 (×2): qty 1

## 2023-08-10 MED ORDER — ARIPIPRAZOLE 10 MG PO TABS
10.0000 mg | ORAL_TABLET | Freq: Two times a day (BID) | ORAL | Status: DC
  Filled 2023-08-10: qty 1

## 2023-08-10 MED ORDER — MORPHINE SULFATE (PF) 2 MG/ML IV SOLN
6.0000 mg | INTRAVENOUS | Status: DC | PRN
  Administered 2023-08-11: 6 mg via INTRAVENOUS
  Filled 2023-08-10: qty 3

## 2023-08-10 MED ORDER — MORPHINE SULFATE (PF) 4 MG/ML IV SOLN
6.0000 mg | Freq: Once | INTRAVENOUS | Status: AC
  Administered 2023-08-10: 6 mg via INTRAVENOUS
  Filled 2023-08-10: qty 2

## 2023-08-10 MED ORDER — ENOXAPARIN SODIUM 40 MG/0.4ML IJ SOSY: 40.0000 mg | PREFILLED_SYRINGE | INTRAMUSCULAR | Status: DC

## 2023-08-10 MED ORDER — LITHIUM CARBONATE 150 MG PO CAPS
150.0000 mg | ORAL_CAPSULE | Freq: Every day | ORAL | Status: DC
  Filled 2023-08-10: qty 1

## 2023-08-10 MED ORDER — CLONAZEPAM 1 MG PO TABS
1.0000 mg | ORAL_TABLET | Freq: Four times a day (QID) | ORAL | Status: DC | PRN
  Administered 2023-08-10: 1 mg via ORAL
  Filled 2023-08-10: qty 1

## 2023-08-10 MED ORDER — IOHEXOL 350 MG/ML SOLN
75.0000 mL | Freq: Once | INTRAVENOUS | Status: AC | PRN
  Administered 2023-08-10: 75 mL via INTRAVENOUS

## 2023-08-10 MED ORDER — BUPROPION HCL ER (XL) 150 MG PO TB24: 300.0000 mg | ORAL_TABLET | Freq: Every day | ORAL | Status: DC

## 2023-08-10 MED ORDER — MORPHINE SULFATE (PF) 4 MG/ML IV SOLN: 6.0000 mg | INTRAVENOUS | Status: DC | PRN

## 2023-08-10 MED ORDER — DEXAMETHASONE SODIUM PHOSPHATE 10 MG/ML IJ SOLN
10.0000 mg | Freq: Once | INTRAMUSCULAR | Status: AC
  Administered 2023-08-10: 10 mg via INTRAVENOUS
  Filled 2023-08-10: qty 1

## 2023-08-10 MED ORDER — ACETAMINOPHEN 325 MG PO TABS
650.0000 mg | ORAL_TABLET | Freq: Four times a day (QID) | ORAL | Status: DC | PRN
  Administered 2023-08-10: 650 mg via ORAL
  Filled 2023-08-10: qty 2

## 2023-08-10 NOTE — ED Notes (Signed)
Pt back from ct with RN, pt reports a slight decrease in pain, pt states that his pain is now a 7/10, pt oriented and moving all extremities, pt remains diaphoretic, pt answering questions appropriately.

## 2023-08-10 NOTE — ED Provider Notes (Signed)
[place] EMERGENCY DEPARTMENT AT [facility] Provider Note   CSN: [id number] Arrival date & time: 08/10/23  4098     History {Add pertinent medical, surgical, social history, OB history to HPI:1} Chief Complaint  Patient presents with   Headache    Jay Montgomery is a 37 y.o. male.  37 year old male with history of spinal stenosis status post surgery in 2019 complicated by CSF leak who presents to the emergency department with headache.  Yesterday at 10 AM patient went to the bathroom and coughed and vomited at the same time and developed a severe sudden onset headache that is the worst of his life.  Radiates from the right side of his head down his neck.  Has not had any neck stiffness or fever.  Took multiple ibuprofen afterwards and says the headache started to resolve but then today he sneezed and the headache recurred.  Also has been diaphoretic.  No known sick contacts.  Vapes but no other recreational drug use or alcohol use.  Not on blood thinners.        Home Medications Prior to Admission medications   Medication Sig Start Date End Date Taking? Authorizing Provider  albuterol (VENTOLIN HFA) 108 (90 Base) MCG/ACT inhaler Inhale 2 puffs into the lungs every 6 (six) hours as needed for wheezing or shortness of breath. 06/09/21   Waldon Merl, PA-C  amphetamine-dextroamphetamine (ADDERALL) 30 MG tablet Take 1 tablet by mouth 2 (two) times daily. 07/29/23   Mozingo, Thereasa Solo, NP  amphetamine-dextroamphetamine (ADDERALL) 30 MG tablet Take 1 tablet by mouth 2 (two) times daily. 08/26/23   Mozingo, Thereasa Solo, NP  amphetamine-dextroamphetamine (ADDERALL) 30 MG tablet Take 1 tablet by mouth 2 (two) times daily. 09/23/23   Mozingo, Thereasa Solo, NP  ARIPiprazole (ABILIFY) 10 MG tablet Take 1 tablet (10 mg total) by mouth 2 (two) times daily. 01/14/23   Mozingo, Thereasa Solo, NP  benzonatate (TESSALON) 100 MG capsule Take 1 capsule (100 mg total) by  mouth 3 (three) times daily as needed for cough. 06/09/21   Waldon Merl, PA-C  buPROPion (WELLBUTRIN XL) 150 MG 24 hr tablet Take one tablet every morning for 7 days, then increase to two tablets every morning. 01/14/23   Mozingo, Thereasa Solo, NP  busPIRone (BUSPAR) 10 MG tablet Take 1 tablet (10 mg total) by mouth 3 (three) times daily. 01/14/23   Mozingo, Thereasa Solo, NP  clonazePAM (KLONOPIN) 1 MG tablet TAKE 1 TABLET BY MOUTH 4 TIMES A DAY AS NEEDED FOR ANXIETY 07/29/23   Mozingo, Thereasa Solo, NP  cloNIDine (CATAPRES) 0.1 MG tablet TAKE 1 TABLET BY MOUTH AT BEDTIME 06/02/23   Mozingo, Thereasa Solo, NP  cyclobenzaprine (FLEXERIL) 5 MG tablet Take 1 tablet (5 mg total) by mouth 3 (three) times daily. 07/12/22   Burgess Amor, PA-C  ibuprofen (ADVIL) 600 MG tablet Take 1 tablet (600 mg total) by mouth 3 (three) times daily. 07/12/22   Burgess Amor, PA-C  lithium carbonate 150 MG capsule TAKE 1 CAPSULE BY MOUTH AT BEDTIME 06/02/23   Mozingo, Thereasa Solo, NP  ondansetron (ZOFRAN-ODT) 4 MG disintegrating tablet 4mg  ODT q4 hours prn nausea/vomit 11/23/21   Bethann Berkshire, MD  oxyCODONE-acetaminophen (PERCOCET/ROXICET) 5-325 MG tablet Take 1 tablet by mouth every 6 (six) hours as needed. 06/23/20   Bethann Berkshire, MD  pantoprazole (PROTONIX) 20 MG tablet Take 1 tablet (20 mg total) by mouth daily. 11/23/21   Bethann Berkshire, MD  traZODone (DESYREL) 50 MG tablet TAKE  1 TO 3 TABLETS BY MOUTH EVERY NIGHT AT BEDTIME 07/26/23   Mozingo, Thereasa Solo, NP      Allergies    Hydroxyzine and Hydrocodone-acetaminophen    Review of Systems   Review of Systems  Physical Exam Updated Vital Signs BP (!) 193/117 (BP Location: Right Arm)   Pulse (!) 59   Temp 97.6 F (36.4 C) (Oral)   Resp 17   Ht 6\' 3"  (1.905 m)   Wt 104.3 kg   SpO2 99%   BMI 28.75 kg/m  Physical Exam Vitals and nursing note reviewed.  Constitutional:      General: He is not in acute distress.    Appearance: He is  well-developed. He is ill-appearing and diaphoretic.  HENT:     Head: Normocephalic and atraumatic.     Right Ear: External ear normal.     Left Ear: External ear normal.     Nose: Nose normal.  Eyes:     Extraocular Movements: Extraocular movements intact.     Conjunctiva/sclera: Conjunctivae normal.     Pupils: Pupils are equal, round, and reactive to light.     Comments: Pupils 5 mm bilaterally  Neck:     Comments: No meningismus Cardiovascular:     Rate and Rhythm: Normal rate and regular rhythm.  Pulmonary:     Effort: Pulmonary effort is normal. No respiratory distress.  Abdominal:     General: There is no distension.     Palpations: Abdomen is soft. There is no mass.     Tenderness: There is no abdominal tenderness. There is no guarding.  Musculoskeletal:     Cervical back: Normal range of motion and neck supple.     Right lower leg: No edema.     Left lower leg: No edema.  Skin:    General: Skin is warm.  Neurological:     Mental Status: He is alert and oriented to person, place, and time. Mental status is at baseline.     Cranial Nerves: No cranial nerve deficit.     Sensory: No sensory deficit.     Motor: No weakness.  Psychiatric:        Mood and Affect: Mood normal.        Behavior: Behavior normal.     ED Results / Procedures / Treatments   Labs (all labs ordered are listed, but only abnormal results are displayed) Labs Reviewed - No data to display  EKG None  Radiology No results found.  Procedures Procedures  {Document cardiac monitor, telemetry assessment procedure when appropriate:1}  Medications Ordered in ED Medications - No data to display  ED Course/ Medical Decision Making/ A&P   {   Click here for ABCD2, HEART and other calculatorsREFRESH Note before signing :1}                              Medical Decision Making Amount and/or Complexity of Data Reviewed Labs: ordered. Radiology: ordered.  Risk Prescription drug  management.   ***  {Document critical care time when appropriate:1} {Document review of labs and clinical decision tools ie heart score, Chads2Vasc2 etc:1}  {Document your independent review of radiology images, and any outside records:1} {Document your discussion with family members, caretakers, and with consultants:1} {Document social determinants of health affecting pt's care:1} {Document your decision making why or why not admission, treatments were needed:1} Final Clinical Impression(s) / ED Diagnoses Final diagnoses:  None  Rx / DC Orders ED Discharge Orders     None

## 2023-08-10 NOTE — H&P (Signed)
History and Physical    Patient: Jay Montgomery VQQ:[id number] DOB: [birth year] DOA: 08/10/2023 DOS: the patient was seen and examined on 08/10/2023 PCP: Waldon Merl, PA-C  Patient coming from: Home  Chief Complaint:  Chief Complaint  Patient presents with   Headache   HPI: Jay Montgomery is a 37 y.o. male with a history of bipolar 1 disorder, ADHD, insomnia, congenital spinal stenosis who presented to the ED with severe headache.   He reports having an episode of coughing with vomiting yesterday after which he developed abrupt severe right-sided headache that was debilitating, somewhat associated with photophobia. Had concomitant diaphoresis, but no fevers or chills. Took ibuprofen with improvement, got some rest last night but had a sneeze this morning with return of symptoms prompting presentation to ED.   Does not regularly get headache, no hx migraines or Tx for migraines. No neck stiffness or rigidity. His daughter recently had cold symptoms. He has tingling at fingertips symmetrically chronically due to congenital spinal stenosis. This symptom is stable. No unilateral weakness/numbness, no vision changes, scotomata, LOC.   Work up in ED included labs which were unremarkable and CTA head/neck which revealed multifocal narrowing of bilateral MCA branches. DDx includes vasculitis or RCVS. No hemorrhage or aneurysm. Neurology was contacted, recommending admission for monitoring given some risk for focal deficits, seizures, to [facility].  Review of Systems: As mentioned in the history of present illness. All other systems reviewed and are negative. Past Medical History:  Diagnosis Date   Anxiety    Chicken pox    Headache    due to CSF leak   Heart murmur    "as a child"   Spinal stenosis    Past Surgical History:  Procedure Laterality Date   ADENOIDECTOMY     BACK SURGERY  08/17/2017   L4-L5   LUMBAR LAMINECTOMY/DECOMPRESSION MICRODISCECTOMY N/A 12/01/2017   Procedure:  REPAIR OF CEREBROSPINAL FLUID LEAK and Placement of Lumbar Drain;  Surgeon: Julio Sicks, MD;  Location: [facility] OR;  Service: Neurosurgery;  Laterality: N/A;   TONSILLECTOMY     Social History:  reports that he has quit smoking. His smoking use included cigarettes. He has never used smokeless tobacco. He reports current alcohol use. He reports that he does not use drugs.  Allergies  Allergen Reactions   Hydroxyzine Other (See Comments)    Dizziness, mental fogginess, nausea   Hydrocodone-Acetaminophen Hives, Itching and Rash    Family History  Problem Relation Age of Onset   Arthritis Mother    Hypertension Mother    COPD Mother    Emphysema Mother    Bipolar disorder Mother    Arthritis Father    Diabetes Father    Bipolar disorder Sister    Schizophrenia Sister     Prior to Admission medications   Medication Sig Start Date End Date Taking? Authorizing Provider  amphetamine-dextroamphetamine (ADDERALL) 30 MG tablet Take 1 tablet by mouth 2 (two) times daily. 07/29/23  Yes Mozingo, Thereasa Solo, NP  ARIPiprazole (ABILIFY) 10 MG tablet Take 1 tablet (10 mg total) by mouth 2 (two) times daily. 01/14/23  Yes Mozingo, Thereasa Solo, NP  buPROPion (WELLBUTRIN XL) 150 MG 24 hr tablet Take one tablet every morning for 7 days, then increase to two tablets every morning. 01/14/23  Yes Mozingo, Thereasa Solo, NP  busPIRone (BUSPAR) 10 MG tablet Take 1 tablet (10 mg total) by mouth 3 (three) times daily. 01/14/23  Yes Mozingo, Thereasa Solo, NP  clonazePAM (KLONOPIN) 1 MG tablet  TAKE 1 TABLET BY MOUTH 4 TIMES A DAY AS NEEDED FOR ANXIETY 07/29/23  Yes Mozingo, Thereasa Solo, NP  lithium carbonate 150 MG capsule TAKE 1 CAPSULE BY MOUTH AT BEDTIME 06/02/23  Yes Mozingo, Thereasa Solo, NP  traZODone (DESYREL) 50 MG tablet TAKE 1 TO 3 TABLETS BY MOUTH EVERY NIGHT AT BEDTIME 07/26/23  Yes Mozingo, Thereasa Solo, NP  albuterol (VENTOLIN HFA) 108 (90 Base) MCG/ACT inhaler Inhale 2 puffs into the  lungs every 6 (six) hours as needed for wheezing or shortness of breath. Patient not taking: Reported on 08/10/2023 06/09/21   Waldon Merl, PA-C  amphetamine-dextroamphetamine (ADDERALL) 30 MG tablet Take 1 tablet by mouth 2 (two) times daily. 08/26/23   Mozingo, Thereasa Solo, NP  amphetamine-dextroamphetamine (ADDERALL) 30 MG tablet Take 1 tablet by mouth 2 (two) times daily. 09/23/23   Mozingo, Thereasa Solo, NP  benzonatate (TESSALON) 100 MG capsule Take 1 capsule (100 mg total) by mouth 3 (three) times daily as needed for cough. Patient not taking: Reported on 08/10/2023 06/09/21   Waldon Merl, PA-C  cloNIDine (CATAPRES) 0.1 MG tablet TAKE 1 TABLET BY MOUTH AT BEDTIME Patient not taking: Reported on 08/10/2023 06/02/23   Mozingo, Thereasa Solo, NP  cyclobenzaprine (FLEXERIL) 5 MG tablet Take 1 tablet (5 mg total) by mouth 3 (three) times daily. Patient not taking: Reported on 08/10/2023 07/12/22   Burgess Amor, PA-C  ibuprofen (ADVIL) 600 MG tablet Take 1 tablet (600 mg total) by mouth 3 (three) times daily. Patient not taking: Reported on 08/10/2023 07/12/22   Burgess Amor, PA-C  ondansetron (ZOFRAN-ODT) 4 MG disintegrating tablet 4mg  ODT q4 hours prn nausea/vomit Patient not taking: Reported on 08/10/2023 11/23/21   Bethann Berkshire, MD  oxyCODONE-acetaminophen (PERCOCET/ROXICET) 5-325 MG tablet Take 1 tablet by mouth every 6 (six) hours as needed. Patient not taking: Reported on 08/10/2023 06/23/20   Bethann Berkshire, MD  pantoprazole (PROTONIX) 20 MG tablet Take 1 tablet (20 mg total) by mouth daily. Patient not taking: Reported on 08/10/2023 11/23/21   Bethann Berkshire, MD    Physical Exam: Vitals:   08/10/23 1230 08/10/23 1245 08/10/23 1400 08/10/23 1415  BP: (!) 155/106 (!) 155/109  (!) 167/119  Pulse: 84 82 99 90  Resp: 17 (!) 22 (!) 22 (!) 24  Temp:      TempSrc:      SpO2: 96% 95% 96% 95%  Weight:      Height:      Gen: No distress Pulm: Clear, nonlabored  CV: RRR, no  MRG or pitting edema GI: Soft, NT, ND, +BS  Neuro: Alert and oriented. No new focal deficits. Ext: Warm, no deformities Skin: No rashes, lesions or ulcers on visualized skin   Data Reviewed: CBC w/ diff normal; BMP normal with exception of glucose 160 (nonfasting) ECG: NSR CTA H/N:  IMPRESSION: 1. No acute intracranial abnormality. 2. Multifocal narrowing of second and third order branches of the bilateral MCAs, which may be due to vasculitis or reversible cerebral vasoconstriction syndrome (RCVS). 3. No hemodynamically significant stenosis in the neck. 4. 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 Fortunately no evidence of SAH on imaging, no focal deficits or seizure-like activity noted either.  - Neurology consulted, Dr. Selina Cooley spoke with EDP, recommended admission to [facility]. Will notify them on arrival. Defer further work  up to them.  - Received decadron. Will continue pain control with tylenol prn mild pain, toradol scheduled, IV morphine prn severe pain - Neurochecks q4h.  - Will plan to avoid vasoconstricting agents e.g. caffeine and will hold adderall until neurology's formal assessment. Of course, avoid triptans.   Bipolar disorder, ADHD, insomnia: Seems well-controlled with current medication regimen. - Continue abilify, buspar, clonazepam, lithium - Given potential for seizure, we will hold wellbutrin for today.  - As above, given concern for vasoconstriction, will hold today's dose of adderall as well.  Congenital spinal stenosis: Noted. Symptoms stable.   Advance Care Planning: Full code  Consults: Neurology  Family Communication: Spouse at bedside  Severity of Illness: The appropriate patient status for  this patient is OBSERVATION. Observation status is judged to be reasonable and necessary in order to provide the required intensity of service to ensure the patient's safety. The patient's presenting symptoms, physical exam findings, and initial radiographic and laboratory data in the context of their medical condition is felt to place them at decreased risk for further clinical deterioration. Furthermore, it is anticipated that the patient will be medically stable for discharge from the hospital within 2 midnights of admission.   Author: Tyrone Nine, MD 08/10/2023 2:27 PM  For on call review [email or web address].

## 2023-08-10 NOTE — Plan of Care (Signed)
  Problem: Education: Goal: Knowledge of General Education information will improve Description: Including pain rating scale, medication(s)/side effects and non-pharmacologic comfort measures Outcome: Progressing   Problem: Clinical Measurements: Goal: Ability to maintain clinical measurements within normal limits will improve Outcome: Progressing Goal: Diagnostic test results will improve Outcome: Progressing Goal: Cardiovascular complication will be avoided Outcome: Progressing   Problem: Activity: Goal: Risk for activity intolerance will decrease Outcome: Progressing

## 2023-08-10 NOTE — ED Notes (Signed)
Pt transported to CT ?

## 2023-08-10 NOTE — ED Notes (Signed)
[facility] has arrived to transport patient, [place] is where the patient is going

## 2023-08-10 NOTE — ED Notes (Signed)
Report given to Hastings Cellar RN at [facility], patient is in room 8

## 2023-08-10 NOTE — Consult Note (Addendum)
NEUROLOGY CONSULT NOTE   Date of service: August 10, 2023 Patient Name: Jay Montgomery MRN:  [id number] DOB:  January 16, 1986 Chief Complaint: "headache" Requesting Provider: Tyrone Nine, MD  History of Present Illness  Jay Montgomery is a 37 y.o. male past medical history of bipolar, ADHD, PTSD, anxiety and depression, congenital spinal stenosis, vapes presenting to the emergency room with severe headache x 2 separate occurrences  First episode he reports he had some coughing and some vomiting when he abruptly developed a severe right-sided headache that was very debilitating associated with photophobia.  He said he took a lot of ibuprofen and the headache resolved.  This morning he woke up and sneezed with the return of debilitating headache numbness and pain on the right side of his face which prompted him to be evaluated in the emergency room. CT head with no acute abnormality.  CTA with multifocal narrowing of the second and third branches of bilateral MCAs.  He was transferred to [facility] for further neurology workup   ROS  Comprehensive ROS performed and pertinent positives documented in HPI    Past History   Past Medical History:  Diagnosis Date   Anxiety    Chicken pox    Headache    due to CSF leak   Heart murmur    "as a child"   Spinal stenosis     Past Surgical History:  Procedure Laterality Date   ADENOIDECTOMY     BACK SURGERY  08/17/2017   L4-L5   LUMBAR LAMINECTOMY/DECOMPRESSION MICRODISCECTOMY N/A 12/01/2017   Procedure: REPAIR OF CEREBROSPINAL FLUID LEAK and Placement of Lumbar Drain;  Surgeon: Julio Sicks, MD;  Location: [facility] OR;  Service: Neurosurgery;  Laterality: N/A;   TONSILLECTOMY      Family History: Family History  Problem Relation Age of Onset   Arthritis Mother    Hypertension Mother    COPD Mother    Emphysema Mother    Bipolar disorder Mother    Arthritis Father    Diabetes Father    Bipolar disorder Sister    Schizophrenia  Sister     Social History  reports that he has quit smoking. His smoking use included cigarettes. He has never used smokeless tobacco. He reports current alcohol use. He reports that he does not use drugs.  Allergies  Allergen Reactions   Hydroxyzine Other (See Comments)    Dizziness, mental fogginess, nausea   Hydrocodone-Acetaminophen Hives, Itching and Rash    Medications   Current Facility-Administered Medications:    acetaminophen (TYLENOL) tablet 650 mg, 650 mg, Oral, Q6H PRN **OR** acetaminophen (TYLENOL) suppository 650 mg, 650 mg, Rectal, Q6H PRN, Tyrone Nine, MD   clonazePAM (KLONOPIN) tablet 1 mg, 1 mg, Oral, QID PRN, Tyrone Nine, MD   enoxaparin (LOVENOX) injection 40 mg, 40 mg, Subcutaneous, Q24H, Jarvis Newcomer, Ryan B, MD   hydrALAZINE (APRESOLINE) injection 10 mg, 10 mg, Intravenous, Q4H PRN, Tyrone Nine, MD   ketorolac (TORADOL) 15 MG/ML injection 15 mg, 15 mg, Intravenous, Q6H, Hazeline Junker B, MD, 15 mg at 08/10/23 1424   morphine (PF) 2 MG/ML injection, , , ,    morphine (PF) 4 MG/ML injection 6 mg, 6 mg, Intravenous, Q4H PRN, Tyrone Nine, MD   ondansetron (ZOFRAN) tablet 4 mg, 4 mg, Oral, Q6H PRN **OR** ondansetron (ZOFRAN) injection 4 mg, 4 mg, Intravenous, Q6H PRN, Hazeline Junker B, MD   sodium chloride flush (NS) 0.9 % injection 3 mL, 3 mL, Intravenous, Q12H, Grunz,  Fulton Reek, MD   verapamil (CALAN) tablet 40 mg, 40 mg, Oral, Q8H, Erick Blinks, MD  Vitals   Vitals:   08-13-2023 1430 13-Aug-2023 1445 August 13, 2023 1500 08/13/23 1619  BP: (!) 184/121 (!) 165/124 (!) 171/118 (!) 170/106  Pulse: 85 (!) 101 90 (!) 105  Resp: 18 19 (!) 23 18  Temp:    97.9 F (36.6 C)  TempSrc:    Oral  SpO2: 96% 95% 96% 96%  Weight:      Height:        Body mass index is 28.75 kg/m.  Physical Exam   Constitutional: Appears well-developed and well-nourished.  Psych: Affect appropriate to situation.  Eyes: No scleral injection.  HENT: No OP obstruction.  Head: Normocephalic.   Cardiovascular: Normal rate and regular rhythm.  Respiratory: Effort normal, non-labored breathing.  GI: Soft.  No distension. There is no tenderness.  Skin: WDI.   Neurologic Examination   Mental Status -  Level of arousal and orientation to time, place, and person were intact. Language including expression, naming, repetition, comprehension was assessed and found intact. Attention span and concentration were normal. Recent and remote memory were intact. Fund of Knowledge was assessed and was intact.  Cranial Nerves II - XII - II - Visual field intact OU. III, IV, VI - Extraocular movements intact. V -decreased sensation on the right side of face VII - Facial movement intact bilaterally. VIII - Hearing & vestibular intact bilaterally. X - Palate elevates symmetrically. XI - Chin turning & shoulder shrug intact bilaterally. XII - Tongue protrusion intact.  Motor Strength - The patient's strength was normal in all extremities and pronator drift was absent.  Bulk was normal and fasciculations were absent.   Motor Tone - Muscle tone was assessed at the neck and appendages and was normal. Sensory - Light touch, temperature/pinprick were assessed and were symmetrical.   Coordination - The patient had normal movements in the hands and feet with no ataxia or dysmetria.  Tremor was absent. Gait and Station - deferred.  Labs/Imaging/Neurodiagnostic studies   CBC:  Recent Labs  Lab Aug 13, 2023 0841 2023/08/13 0844  WBC  --  8.0  NEUTROABS  --  4.8  HGB 17.0 16.7  HCT 50.0 51.1  MCV  --  91.9  PLT  --  247    Basic Metabolic Panel:  Lab Results  Component Value Date   NA 138 08-13-2023   K 3.9 13-Aug-2023   CO2 22 08/13/23   GLUCOSE 160 (H) 08-13-2023   BUN 13 08-13-23   CREATININE 1.01 Aug 13, 2023   CALCIUM 9.0 08-13-23   GFRNONAA >60 13-Aug-2023   GFRAA >60 06/23/2020    Lipid Panel: No results found for: "LDLCALC"  HgbA1c: No results found for: "HGBA1C"  Urine  Drug Screen: No results found for: "LABOPIA", "COCAINSCRNUR", "LABBENZ", "AMPHETMU", "THCU", "LABBARB"   Alcohol Level No results found for: "ETH"  INR  Lab Results  Component Value Date   INR 0.9 11/23/2021    APTT No results found for: "APTT"  AED levels: No results found for: "PHENYTOIN", "ZONISAMIDE", "LAMOTRIGINE", "LEVETIRACETA"    CT Head without contrast(Personally reviewed): No acute abnormality  CT angio Head and Neck with contrast(Personally reviewed): Multifocal narrowing of bilateral MCA branches   ASSESSMENT   KAIO PIROLLI is a 37 y.o. 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 Concern for RCVS versus vasculitis  RECOMMENDATIONS  MRI w/o brain Verapamil 40 mg PO every 8 hours Discontinue serotonergic medications including Abilify, Wellbutrin XL, BuSpar, lithium, trazodone Will need psych consult to help with medication management of above Vasculitis workup He has been educated on no sneezing, no coughing, Valsalva, no strenuous activities, no sexual activity, no vaping for the next 6 weeks.  - repeat CT Angio head in about 8 weeks. Stroke team will follow _____________________________________________________________________  Signed,  Mathews Argyle [facility]   NEUROHOSPITALIST ADDENDUM Performed a face to face diagnostic evaluation.   I have reviewed the contents of history and physical exam as documented by PA/ARNP/Resident and agree with above documentation.  I have discussed and formulated the above plan as documented. Edits to the note have been made as needed.  Impression/Key exam findings/Plan: 2 distinct episodes of sudden onset, maximal at onset, debilitating thunderclap headache which brought him to  the hospital. First headache occurred after vomiting/cough spell, second occurred the next day after sneezing. CTA with multifocal distal MCA branch narrowing concerning for RCVS vs CNS vasculitis. RCVS 2 score is 6.  - Avoid triggers including avoiding serotonergic agents,  - Avoid strenuous physical activity, sexual intercourse for the next 6 weeks, no bearing down, avoiding cough and sneeze. Use laxatives PRN in case of constipation. - discussed that he should not use Delta 8 vapes, no CBD or other marijuana derivatives. - Will get MRI brain w/o contrast. - Repeat CTA head in 8 weeks. - Q2H neuro checks tonight and can space out tomorrow AM if stable. - Will need psychiatry team input given several of his long term psychoactive agents are serotonergic agents which have been implicated as triggers/risk factors for RCVS. - vasculitis workup - Verapamil 40mg  TID.    Erick Blinks, MD [facility] [phone]   If 7pm to 7am, please call on call as listed on AMION.

## 2023-08-10 NOTE — ED Triage Notes (Signed)
Pt to er room number one via wc, pt states that yesterday he coughed and vomited at the same time and had a sudden onset of headache, pt states that it got better, but today he sneezed and his headache came back, pt denies weakness, pt is very diaphoretic.  Md notified of pt

## 2023-08-11 ENCOUNTER — Encounter (HOSPITAL_COMMUNITY): Payer: Self-pay | Admitting: Family Medicine

## 2023-08-11 DIAGNOSIS — F411 Generalized anxiety disorder: Secondary | ICD-10-CM

## 2023-08-11 DIAGNOSIS — F331 Major depressive disorder, recurrent, moderate: Secondary | ICD-10-CM

## 2023-08-11 LAB — LIPID PANEL
Cholesterol: 311 mg/dL — ABNORMAL HIGH (ref 0–200)
HDL: 44 mg/dL (ref >40)
LDL Cholesterol: 232 mg/dL — ABNORMAL HIGH (ref 0–99)
Total CHOL/HDL Ratio: 7.1 {ratio}
Triglycerides: 176 mg/dL — ABNORMAL HIGH (ref <150)
VLDL: 35 mg/dL (ref 0–40)

## 2023-08-11 LAB — HEMOGLOBIN A1C
Hgb A1c MFr Bld: 5.5 % (ref 4.8–5.6)
Mean Plasma Glucose: 111.15 mg/dL

## 2023-08-11 MED ORDER — VERAPAMIL HCL 40 MG PO TABS: 40.0000 mg | ORAL_TABLET | Freq: Three times a day (TID) | ORAL | 0 refills | Status: DC

## 2023-08-11 MED ORDER — DIVALPROEX SODIUM ER 500 MG PO TB24: 500.0000 mg | ORAL_TABLET | Freq: Every day | ORAL | 0 refills | Status: DC

## 2023-08-11 MED ORDER — TOPIRAMATE 25 MG PO TABS
50.0000 mg | ORAL_TABLET | Freq: Two times a day (BID) | ORAL | Status: DC
  Administered 2023-08-11: 50 mg via ORAL
  Filled 2023-08-11: qty 2

## 2023-08-11 MED ORDER — DIVALPROEX SODIUM ER 500 MG PO TB24
500.0000 mg | ORAL_TABLET | Freq: Every day | ORAL | Status: DC
  Administered 2023-08-11: 500 mg via ORAL
  Filled 2023-08-11: qty 1

## 2023-08-11 MED ORDER — TOPIRAMATE 50 MG PO TABS: 50.0000 mg | ORAL_TABLET | Freq: Two times a day (BID) | ORAL | 0 refills | Status: DC

## 2023-08-11 MED ORDER — ROSUVASTATIN CALCIUM 20 MG PO TABS
40.0000 mg | ORAL_TABLET | Freq: Every day | ORAL | Status: DC
  Administered 2023-08-11: 40 mg via ORAL
  Filled 2023-08-11: qty 2

## 2023-08-11 NOTE — Consult Note (Signed)
Inpatient  Face-to-Face Psychiatry Consult   Reason for Consult:  Hx bipolar disorder on many medications that need to be stopped for cerebral vasoconstriction syndrome. Need alternative recommendations.  Referring Physician:  Dr. Jarvis Newcomer Patient Identification: Jay Montgomery MRN:  [id number] Principal Diagnosis: Cerebral vasoconstriction syndrome Diagnosis:  Principal Problem:   Cerebral vasoconstriction syndrome Active Problems:   GAD (generalized anxiety disorder)   Major depressive disorder, recurrent episode, moderate (HCC)    HPI:  Jay Montgomery is a 37 y.o. male patient admitted with severe headache x 2 separate occurrences.  He has a history of congenital spinal stenosis and vapes.  He has a history of Bipolar disorder, ADHD, PTSD and anxiety.  Urine drug screen was positive for opiates and THC.  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  Psychiatry was consulted to review psychiatric medications for recommendations to avoid triggering serotonergic agents.   Review of PDMP: Written and filled Klonopin 1 mg QID  November 5th  Adderall 30 filled # 60 September 30th Adderall 20  filled #60 September 30th   He states that he has not been able to pick up his Adderall due to supply shortage, but it is available for pick-up.  He states that his Klonopin is prescribed for QID PRN, but that he typically will take once or twice a day, and uses 2 at night.   Patient has been under the care of Yvette Rack, NP for primary outpatient psychiatric care since 2020. He believes that he has a f/u appt December 7th or 8th.  He states that he has had good symptom control on his  current medications, specifically resolution of suicidal ideation since addition of lithium at night. There were no medication changes prior to the onset of headaches. He denies any life stressors or other lifestyle changes prior to onset of headaches. He does describe some tingling sensation in his fingers after he takes his Buspar.  Patient states that he has "psychotic anger spells" that have decreased since being on Abilify. He continues to have difficulty with falling asleep and staying asleep despite Trazodone and two Klonopin.  Today, patient specifically denies any SI, HI, AVH.  He is hopeful for discharge, and expresses desire to f/u with his psychiatric NP  He denies any headache today.    Past Psychiatric History: bipolar, ADHD, PTSD, anxiety and depression,  Risk to Self:  denies Risk to Others:  denies Prior Inpatient Therapy:  denies Prior Outpatient Therapy:  Compliant with outpatient provider, NP Yvette Rack   Past Medical History:  Past Medical History:  Diagnosis Date   Anxiety    Chicken pox    Headache    due to CSF leak   Heart murmur    "as a child"   Spinal stenosis     Past Surgical History:  Procedure Laterality Date   ADENOIDECTOMY     BACK SURGERY  08/17/2017   L4-L5   LUMBAR LAMINECTOMY/DECOMPRESSION MICRODISCECTOMY N/A 12/01/2017   Procedure: REPAIR OF CEREBROSPINAL FLUID LEAK and Placement of Lumbar Drain;  Surgeon: Julio Sicks, MD;  Location: [facility] OR;  Service: Neurosurgery;  Laterality: N/A;   TONSILLECTOMY     Family History:  Family History  Problem Relation Age of  Onset   Arthritis Mother    Hypertension Mother    COPD Mother    Emphysema Mother    Bipolar disorder Mother    Arthritis Father    Diabetes Father    Bipolar disorder Sister    Schizophrenia Sister    Family Psychiatric  History: as above  Social History:  Social History   Substance and Sexual Activity  Alcohol Use Yes   Comment: occassional     Social History    Substance and Sexual Activity  Drug Use No    Social History   Socioeconomic History   Marital status: Legally Separated    Spouse name: Not on file   Number of children: Not on file   Years of education: Not on file   Highest education level: Not on file  Occupational History   Occupation: Designer, jewellery  Tobacco Use   Smoking status: Former    Current packs/day: 1.00    Types: Cigarettes   Smokeless tobacco: Never  Vaping Use   Vaping status: Every Day  Substance and Sexual Activity   Alcohol use: Yes    Comment: occassional   Drug use: No   Sexual activity: Yes    Partners: Female    Comment: wife  Other Topics Concern   Not on file  Social History Narrative   Not on file   Social Determinants of Health   Financial Resource Strain: Not on file  Food Insecurity: Food Insecurity Present (08/10/2023)   Hunger Vital Sign    Worried About Running Out of Food in the Last Year: Never true    Ran Out of Food in the Last Year: Sometimes true  Transportation Needs: No Transportation Needs (08/10/2023)   PRAPARE - Administrator, Civil Service (Medical): No    Lack of Transportation (Non-Medical): No  Physical Activity: Not on file  Stress: Not on file  Social Connections: Unknown (06/20/2023)   Received from [facility]   Social Network    Social Network: Not on file   Additional Social History:  Lives with wife and family    Allergies:   Allergies  Allergen Reactions   Hydroxyzine Other (See Comments)    Dizziness, mental fogginess, nausea   Hydrocodone-Acetaminophen Hives, Itching and Rash    Labs:  Results for orders placed or performed during the hospital encounter of 08/10/23 (from the past 48 hour(s))  I-Stat Chem 8, ED     Status: Abnormal   Collection Time: 08/10/23  8:41 AM  Result Value Ref Range   Sodium 140 135 - 145 mmol/L   Potassium 4.0 3.5 - 5.1 mmol/L   Chloride 105 98 - 111 mmol/L   BUN 13 6 - 20 mg/dL   Creatinine, Ser  1.61 0.61 - 1.24 mg/dL   Glucose, Bld 096 (H) 70 - 99 mg/dL    Comment: Glucose reference range applies only to samples taken after fasting for at least 8 hours.   Calcium, Ion 1.16 1.15 - 1.40 mmol/L   TCO2 22 22 - 32 mmol/L   Hemoglobin 17.0 13.0 - 17.0 g/dL   HCT 04.5 40.9 - 81.1 %  Basic metabolic panel     Status: Abnormal   Collection Time: 08/10/23  8:44 AM  Result Value Ref Range   Sodium 138 135 - 145 mmol/L   Potassium 3.9 3.5 - 5.1 mmol/L   Chloride 105 98 - 111 mmol/L   CO2 22 22 - 32 mmol/L   Glucose, Bld  160 (H) 70 - 99 mg/dL    Comment: Glucose reference range applies only to samples taken after fasting for at least 8 hours.   BUN 13 6 - 20 mg/dL   Creatinine, Ser 5.62 0.61 - 1.24 mg/dL   Calcium 9.0 8.9 - 13.0 mg/dL   GFR, Estimated >86 >57 mL/min    Comment: (NOTE) Calculated using the CKD-EPI Creatinine Equation (2021)    Anion gap 11 5 - 15    Comment: Performed at [facility], [address], [place], [place] [address]  CBC with Differential/Platelet     Status: Abnormal   Collection Time: 08/10/23  8:44 AM  Result Value Ref Range   WBC 8.0 4.0 - 10.5 K/uL   RBC 5.56 4.22 - 5.81 MIL/uL   Hemoglobin 16.7 13.0 - 17.0 g/dL   HCT 29.5 28.4 - 13.2 %   MCV 91.9 80.0 - 100.0 fL   MCH 30.0 26.0 - 34.0 pg   MCHC 32.7 30.0 - 36.0 g/dL   RDW 44.0 10.2 - 72.5 %   Platelets 247 150 - 400 K/uL   nRBC 0.0 0.0 - 0.2 %   Neutrophils Relative % 59 %   Neutro Abs 4.8 1.7 - 7.7 K/uL   Lymphocytes Relative 25 %   Lymphs Abs 2.0 0.7 - 4.0 K/uL   Monocytes Relative 9 %   Monocytes Absolute 0.7 0.1 - 1.0 K/uL   Eosinophils Relative 5 %   Eosinophils Absolute 0.4 0.0 - 0.5 K/uL   Basophils Relative 1 %   Basophils Absolute 0.0 0.0 - 0.1 K/uL   Immature Granulocytes 1 %   Abs Immature Granulocytes 0.09 (H) 0.00 - 0.07 K/uL    Comment: Performed at [facility], [address], [place], [place] [address]  Rapid urine drug screen (hospital performed)     Status: Abnormal    Collection Time: 08/10/23  4:27 PM  Result Value Ref Range   Opiates POSITIVE (A) NONE DETECTED   Cocaine NONE DETECTED NONE DETECTED   Benzodiazepines NONE DETECTED NONE DETECTED   Amphetamines NONE DETECTED NONE DETECTED   Tetrahydrocannabinol POSITIVE (A) NONE DETECTED   Barbiturates NONE DETECTED NONE DETECTED    Comment: (NOTE) DRUG SCREEN FOR MEDICAL PURPOSES ONLY.  IF CONFIRMATION IS NEEDED FOR ANY PURPOSE, NOTIFY LAB WITHIN 5 DAYS.  LOWEST DETECTABLE LIMITS FOR URINE DRUG SCREEN Drug Class                     Cutoff (ng/mL) Amphetamine and metabolites    1000 Barbiturate and metabolites    200 Benzodiazepine                 200 Opiates and metabolites        300 Cocaine and metabolites        300 THC                            50 Performed at [facility], [address] [place], [place] [address]   Sedimentation rate     Status: None   Collection Time: 08/10/23  8:28 PM  Result Value Ref Range   Sed Rate 7 0 - 16 mm/hr    Comment: Performed at [facility], [address] [place], [place] [address]  C-reactive protein     Status: Abnormal   Collection Time: 08/10/23  8:28 PM  Result Value Ref Range   CRP 1.3 (  H) <1.0 mg/dL    Comment: Performed at [facility], [address] [place], [place] [address]  Hemoglobin A1c     Status: None   Collection Time: 08/11/23  5:04 AM  Result Value Ref Range   Hgb A1c MFr Bld 5.5 4.8 - 5.6 %    Comment: (NOTE) Pre diabetes:          5.7%-6.4%  Diabetes:              >6.4%  Glycemic control for   <7.0% adults with diabetes    Mean Plasma Glucose 111.15 mg/dL    Comment: Performed at [facility], [address], [place], [place] [address]  Lipid panel     Status: Abnormal   Collection Time: 08/11/23  5:04 AM  Result Value Ref Range   Cholesterol 311 (H) 0 - 200 mg/dL   Triglycerides 829 (H) <150 mg/dL   HDL 44 >56 mg/dL   Total CHOL/HDL Ratio 7.1 RATIO   VLDL 35 0 - 40 mg/dL   LDL  Cholesterol 213 (H) 0 - 99 mg/dL    Comment:        Total Cholesterol/HDL:CHD Risk Coronary Heart Disease Risk Table                     Men   Women  1/2 Average Risk   3.4   3.3  Average Risk       5.0   4.4  2 X Average Risk   9.6   7.1  3 X Average Risk  23.4   11.0        Use the calculated Patient Ratio above and the CHD Risk Table to determine the patient's CHD Risk.        ATP III CLASSIFICATION (LDL):  <100     mg/dL   Optimal  086-578  mg/dL   Near or Above                    Optimal  130-159  mg/dL   Borderline  469-629  mg/dL   High  >528     mg/dL   Very High Performed at [facility], [address] [place], [place] [address]     Current Facility-Administered Medications  Medication Dose Route Frequency Provider Last Rate Last Admin   acetaminophen (TYLENOL) tablet 650 mg  650 mg Oral Q6H PRN Tyrone Nine, MD   650 mg at 08/10/23 2203   Or   acetaminophen (TYLENOL) suppository 650 mg  650 mg Rectal Q6H PRN Tyrone Nine, MD       divalproex (DEPAKOTE ER) 24 hr tablet 500 mg  500 mg Oral Daily Mariel Craft, MD   500 mg at 08/11/23 1446   enoxaparin (LOVENOX) injection 40 mg  40 mg Subcutaneous Q24H Hazeline Junker B, MD       hydrALAZINE (APRESOLINE) injection 10 mg  10 mg Intravenous Q4H PRN Tyrone Nine, MD   10 mg at 08/10/23 2203   ketorolac (TORADOL) 15 MG/ML injection 15 mg  15 mg Intravenous Q6H Hazeline Junker B, MD   15 mg at 08/11/23 1130   morphine (PF) 2 MG/ML injection 6 mg  6 mg Intravenous Q4H PRN Tyrone Nine, MD   6 mg at 08/11/23 0026   ondansetron (ZOFRAN) tablet 4 mg  4 mg Oral Q6H PRN Tyrone Nine, MD       Or  ondansetron (ZOFRAN) injection 4 mg  4 mg Intravenous Q6H PRN Tyrone Nine, MD       rosuvastatin (CRESTOR) tablet 40 mg  40 mg Oral Daily Peterson Ao, MD   40 mg at 08/11/23 1403   sodium chloride flush (NS) 0.9 % injection 3 mL  3 mL Intravenous Q12H Hazeline Junker B, MD   3 mL at 08/11/23 0806   topiramate (TOPAMAX) tablet  50 mg  50 mg Oral BID Elmer Picker, NP   50 mg at 08/11/23 1130   verapamil (CALAN) tablet 40 mg  40 mg Oral Q8H Erick Blinks, MD   40 mg at 08/11/23 1404    Musculoskeletal: Strength & Muscle Tone: within normal limits Gait & Station:  normal station, gait no assessed Patient leans: N/A  Psychiatric Specialty Exam:  Presentation  General Appearance: Appropriate for Environment; Casual  Eye Contact:Good  Speech:Clear and Coherent; Normal Rate  Speech Volume:Normal  Handedness:No data recorded  Mood and Affect  Mood:Euthymic  Affect:Congruent; Full Range   Thought Process  Thought Processes:Coherent; Goal Directed; Linear  Descriptions of Associations:Intact  Orientation:Full (Time, Place and Person)  Thought Content:Logical  History of Schizophrenia/Schizoaffective disorder:No data recorded Duration of Psychotic Symptoms:No data recorded Hallucinations:Hallucinations: None  Ideas of Reference:None  Suicidal Thoughts:Suicidal Thoughts: No  Homicidal Thoughts:Homicidal Thoughts: No   Sensorium  Memory:Immediate Good; Recent Fair  Judgment:Fair  Insight:Fair   Executive Functions  Concentration:Good  Attention Span:Good  Recall:Good  Fund of Knowledge:Good  Language:Good   Psychomotor Activity  Psychomotor Activity:Psychomotor Activity: Normal   Assets  Assets:Communication Skills; Desire for Improvement; Financial Resources/Insurance; Housing; Social Support; Resilience   Sleep  Sleep:Sleep: Fair   Physical Exam: Physical Exam Constitutional:      Appearance: He is well-developed.  HENT:     Head: Normocephalic.  Pulmonary:     Effort: Pulmonary effort is normal. No respiratory distress.  Neurological:     Mental Status: He is alert.  Psychiatric:        Mood and Affect: Mood normal.        Behavior: Behavior normal.    Review of Systems  Constitutional: Negative.   Respiratory: Negative.    Cardiovascular:  Negative.   Neurological:  Positive for tingling (in fingers after taking Buspar).  Psychiatric/Behavioral:  Negative for depression, hallucinations, substance abuse and suicidal ideas. The patient has insomnia (without medications). The patient is not nervous/anxious.    Blood pressure (!) 154/105, pulse 80, temperature 97.8 F (36.6 C), temperature source Oral, resp. rate 18, height 6\' 3"  (1.905 m), weight 104.3 kg, SpO2 96%. Body mass index is 28.75 kg/m.    Treatment Plan Summary: Daily contact with patient to assess and evaluate symptoms and progress in treatment and Medication management:  I have spoken with Yvette Rack, NP to review medications and medication recommendations.  Start Depakote ER 500 mg at HS for mood stabilization, behavioral disturbance, headache prevention, with benefit of sedating effect at HS. Patient should have a Depakote level with CBC and CMP drawn in approximately 5 days.  This has been ordered for inpatient lab draw if patient remains admitted. Can continue clonidine 0.1 mg at bedtime if needed for sleep initiation. Patient could use clonidine 0.1 mg daily as needed for anger/outburst prevention.  Discontinue Trazodone and Buspar due to high serotonergic effects.  Discontinue Adderall due to vasoconstrictive effect.  Consider discontinuation of Klonopin. Benzodiazepines enhance serotonin activity  should not be prescribed.   The detection window for Klonopin in urine is  typically 2-7 days.  Patient's UDS negative for Klonopin despite listed as regular home medication.  AN ADD ON URINE DRUG SCREEN TO DETECT KLONOPIN HAS BEEN ADDED.   Can maintain Abilify as needed for further mood stabilization.  Note that patient's total cholesterol was 311, LDL was 252, and triglycerides elevated at 176.  Hemoglobin A1c was within normal limits at 5.5. Should patient stay on antipsychotic medication, he should have regular screening of lipid levels with consideration of  discontinuation for metabolic  syndrome.  Patient can discuss dyslipidemia with his primary care provider.  -Wellbutrin XL can be continued for depression due to primary dopaminergic activity. -Patient wishes to continue lithium carbonate 150 mg at bedtime for decreasing suicidal ideation.   Disposition: No evidence of imminent risk to self or others at present.   Patient does not meet criteria for psychiatric inpatient admission. Supportive therapy provided about ongoing stressors.  Outpatient psychiatry office will contact patient to ensure he has follow-up within 1-2 weeks following his discharge from the hospital.  While future psychiatric events cannot be accurately predicted, the patient does not currently require acute inpatient psychiatric care and does not currently meet [facility] involuntary commitment criteria.  Psychiatry will sign off.  Please re-consult for any future acute psychiatric concerns.     Mariel Craft, MD 08/11/2023 3:58 PM  Total Time Spent in Direct Patient Care:  I personally spent 90 minutes on the unit in direct patient care. The direct patient care time included face-to-face time with the patient, reviewing the patient's chart, communicating with other professionals, and coordinating care. Greater than 50% of this time was spent in counseling or coordinating care with the patient regarding goals of hospitalization, psycho-education, and discharge planning needs.

## 2023-08-11 NOTE — Progress Notes (Signed)
Discharge education/instructions provided to patient, patient verbalized understanding and in agreement. VSS. Respirations are even and unlabored. NAD. RA. Patient denies CP or SOB. PIV removed, tip intact.   All personal belongings sent with patient.

## 2023-08-11 NOTE — Discharge Summary (Signed)
Physician Discharge Summary  Jay Montgomery ZHY:[id number] DOB: June 07, 1986 DOA: 08/10/2023  PCP: No primary care provider on file.  Admit date: 08/10/2023 Discharge date: 08/11/2023  Admitted From: Home Discharge disposition: Home  Recommendations at discharge:  Following occasions have been added: Depakote, verapamil and Topamax Continue to monitor blood pressure as an outpatient. Follow-up with PCP Follow-up with neurology and psychiatry as an outpatient.    Brief narrative: Jay Montgomery is a 37 y.o. male with PMH significant for bipolar disorder, ADHD, insomnia, congenital spinal stenosis 11/20, patient presented with severe headache. He reports having an episode of coughing with vomiting the day prior after which he developed abrupt severe right-sided headache that was debilitating, somewhat associated with photophobia. Had concomitant diaphoresis, but no fevers or chills. Took ibuprofen with improvement, got some rest last night but had a sneeze this morning with return of symptoms prompting presentation to ED.  Does not regularly get headache, no hx migraines or Tx for migraines. No neck stiffness or rigidity. His daughter recently had cold symptoms.   In the ED, patient was afebrile, blood pressure elevated to 170s to 190s Labs with unremarkable CBC, BMP UDS positive for opiate and THC CTA head/neck which showed multifocal narrowing of bilateral MCA branches.  DDx includes vasculitis or RCVS. No hemorrhage or aneurysm.  Neurology was consulted and recommended admission for monitoring given some risk for focal deficits, seizures, to [facility]. Admitted to [facility] MRI brain did not show any acute intracranial abnormality  Subjective: Patient was seen and examined this morning. Young.  Not in distress. Seen by neurology and psychiatry. Chart reviewed Labs from this morning with A1c 5.5, HDL 44, LDL super elevated at 232  Assessment and plan: Sudden onset severe  thunderclap headache Underwent imagings which ruled out [facility], stroke. Neurology was consulted. 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 He was started on verapamil 40 mg 3 times daily.  Also added Topamax 50 mg twice daily today. Per neurology recommendation, to avoid triggers including serotonergic agents To avoid strenuous physical activity, sexual intercourse for the next 6 weeks, no bearing down, avoid cough and sneezing, laxative as needed for constipation. Needs repeat CTA head in 8 weeks.  Hypertension Patient denies prior history of hypertension but his blood pressure has been consistently elevated while in the hospital up to 170s He has been started on verapamil because of the concern of cerebral vasospasm.  It should help on blood pressure control as well.  Continue to monitor blood pressure as an outpatient.  Follow-up with PCP.   Bipolar disorder, ADHD, insomnia Seems well-controlled with current medication regimen. Continue abilify, buspar, clonazepam, lithium, bupropion, Adderall However, given the serotonergic nature of multiple psychiatric medications that the patient is on, psychiatry team was consulted. Per psychiatry Dr. Viviano Simas, she has reached out to patient's outpatient psych NP to discuss his medications.  Depakote 500 mg daily was added for mood stabilizer and headache prevention.  Patient to follow-up with outpatient mental provider to work on his meds.     Congenital spinal stenosis He has chronic tingling at fingertips  Goals of care   Code Status: Full Code   Wounds:  -    Discharge Exam:   Vitals:   08/11/23 0405 08/11/23 0800 08/11/23 1124 08/11/23 1400  BP: (!) 139/112 (!) 173/111 (!) 139/106 (!) 154/105  Pulse: 91  99 80  Resp: 18  18 18   Temp: 98.7 F (37.1 C) 97.7 F (36.5 C) 97.8 F (36.6 C)    TempSrc: Oral Oral Oral   SpO2: 96% 93% 96%   Weight:      Height:        Body mass index is 28.75 kg/m.  General exam: Pleasant young.  Not in distress Skin: No rashes, lesions or ulcers. HEENT: Atraumatic, normocephalic, no obvious bleeding Lungs: Clear to auscultation bilaterally CVS: Regular rate and rhythm, no murmur GI/Abd soft, nontender, nondistended, bowel sound present CNS: Alert, awake, oriented x 3 Psychiatry: Mood appropriate Extremities: No pedal edema, no calf tenderness  Follow ups:    Follow-up Information     [facility] Follow up.   Contact information: [address] [place] [address] [phone]                Discharge Instructions:   Discharge Instructions     Ambulatory referral to Neurology   Complete by: As directed    Patient needs follow-up in 4 weeks to discuss headache symptoms.   Ambulatory referral to Neurology   Complete by: As directed    Patient needs follow-up in 8 weeks in clinic for RCVS. Needs repeat CTA ordered.   Call MD for:  difficulty breathing, headache or visual disturbances   Complete by: As directed    Call MD for:  extreme fatigue   Complete by: As directed    Call MD for:  hives   Complete by: As directed    Call MD for:  persistant dizziness or light-headedness   Complete by: As directed    Call MD for:  persistant nausea and vomiting   Complete by: As directed    Call MD for:  severe uncontrolled pain   Complete by: As directed    Call MD for:  temperature >100.4   Complete by: As directed    Diet general   Complete by: As directed    Discharge instructions   Complete by: As directed    Recommendations at discharge:   Following occasions have been added: Depakote, verapamil and Topamax  Continue to monitor blood pressure as an outpatient.  Follow-up with PCP  Follow-up with neurology and psychiatry as an outpatient.  General  discharge instructions: Follow with Primary MD No primary care provider on file. in 7 days  Please request your PCP  to go over your hospital tests, procedures, radiology results at the follow up. Please get your medicines reviewed and adjusted.  Your PCP may decide to repeat certain labs or tests as needed. Do not drive, operate heavy machinery, perform activities at heights, swimming or participation in water activities or provide baby sitting services if your were admitted for syncope or siezures until you have seen by Primary MD or a Neurologist and advised to do so again. [place] Controlled Substance Reporting System database was reviewed. Do not drive, operate heavy machinery, perform activities at heights, swim, participate in water activities or provide baby-sitting services while on medications for pain, sleep and mood until your outpatient physician has reevaluated you and advised to do so again.  You are strongly recommended to comply with the dose, frequency and duration of prescribed medications. Activity: As tolerated with Full fall precautions use walker/cane & assistance as needed Avoid using any recreational substances like cigarette, tobacco, alcohol, or non-prescribed drug. If you experience worsening of your admission symptoms, develop shortness of breath, life threatening emergency,  suicidal or homicidal thoughts you must seek medical attention immediately by calling 911 or calling your MD immediately  if symptoms less severe. You must read complete instructions/literature along with all the possible adverse reactions/side effects for all the medicines you take and that have been prescribed to you. Take any new medicine only after you have completely understood and accepted all the possible adverse reactions/side effects.  Wear Seat belts while driving. You were cared for by a hospitalist during your hospital stay. If you have any questions about your discharge medications or  the care you received while you were in the hospital after you are discharged, you can call the unit and ask to speak with the hospitalist or the covering physician. Once you are discharged, your primary care physician will handle any further medical issues. Please note that NO REFILLS for any discharge medications will be authorized once you are discharged, as it is imperative that you return to your primary care physician (or establish a relationship with a primary care physician if you do not have one).   Increase activity slowly   Complete by: As directed        Discharge Medications:   Allergies as of 08/11/2023       Reactions   Hydroxyzine Other (See Comments)   Dizziness, mental fogginess, nausea   Hydrocodone-acetaminophen Hives, Itching, Rash        Medication List     STOP taking these medications    albuterol 108 (90 Base) MCG/ACT inhaler Commonly known as: VENTOLIN HFA   benzonatate 100 MG capsule Commonly known as: TESSALON   cloNIDine 0.1 MG tablet Commonly known as: CATAPRES   cyclobenzaprine 5 MG tablet Commonly known as: FLEXERIL   ibuprofen 600 MG tablet Commonly known as: ADVIL   ondansetron 4 MG disintegrating tablet Commonly known as: ZOFRAN-ODT   oxyCODONE-acetaminophen 5-325 MG tablet Commonly known as: PERCOCET/ROXICET   pantoprazole 20 MG tablet Commonly known as: PROTONIX       TAKE these medications    amphetamine-dextroamphetamine 30 MG tablet Commonly known as: Adderall Take 1 tablet by mouth 2 (two) times daily. What changed: Another medication with the same name was removed. Continue taking this medication, and follow the directions you see here.   ARIPiprazole 10 MG tablet Commonly known as: Abilify Take 1 tablet (10 mg total) by mouth 2 (two) times daily.   buPROPion 150 MG 24 hr tablet Commonly known as: Wellbutrin XL Take one tablet every morning for 7 days, then increase to two tablets every morning.   busPIRone 10  MG tablet Commonly known as: BUSPAR Take 1 tablet (10 mg total) by mouth 3 (three) times daily.   clonazePAM 1 MG tablet Commonly known as: KLONOPIN TAKE 1 TABLET BY MOUTH 4 TIMES A DAY AS NEEDED FOR ANXIETY   divalproex 500 MG 24 hr tablet Commonly known as: DEPAKOTE ER Take 1 tablet (500 mg total) by mouth daily. Start taking on: August 12, 2023   lithium carbonate 150 MG capsule TAKE 1 CAPSULE BY MOUTH AT BEDTIME   topiramate 50 MG tablet Commonly known as: TOPAMAX Take 1 tablet (50 mg total) by mouth 2 (two) times daily.   traZODone 50 MG tablet Commonly known as: DESYREL TAKE 1 TO 3 TABLETS BY MOUTH EVERY NIGHT AT BEDTIME   verapamil 40 MG tablet Commonly known as: CALAN Take 1 tablet (40 mg total) by mouth every 8 (eight) hours.         The results of significant  diagnostics from this hospitalization (including imaging, microbiology, ancillary and laboratory) are listed below for reference.    Procedures and Diagnostic Studies:   MR BRAIN WO CONTRAST  Result Date: 08/11/2023 CLINICAL DATA:  Headache, neuro deficit, stroke suspected EXAM: MRI HEAD WITHOUT CONTRAST TECHNIQUE: Multiplanar, multiecho pulse sequences of the brain and surrounding structures were obtained without intravenous contrast. COMPARISON:  Available, correlation is made with 08/10/2023 CTA FINDINGS: Brain: No prior MRI no acute hemorrhage, mass, mass effect, or midline shift. No hydrocephalus or extra-axial collection. Pituitary and craniocervical junction within normal limits. No hemosiderin deposition to suggest remote hemorrhage. Vascular: Normal arterial flow voids. Skull and upper cervical spine: Normal marrow signal. Sinuses/Orbits: Mucosal thickening in the right frontal sinus, ethmoid air cells, right maxillary sinus, and right sphenoid sinus. No acute finding in the orbits. Other: The mastoid air cells are well aerated. IMPRESSION: No acute intracranial process. No evidence of acute or  subacute infarct. Electronically Signed   By: Wiliam Ke M.D.   On: 08/11/2023 03:44   CT ANGIO HEAD NECK W WO CM  Result Date: 08/10/2023 CLINICAL DATA:  severe abrupt onset headache 24 hours ago radiating down his R neck. EXAM: CT ANGIOGRAPHY HEAD AND NECK WITH AND WITHOUT CONTRAST TECHNIQUE: Multidetector CT imaging of the head and neck was performed using the standard protocol during bolus administration of intravenous contrast. Multiplanar CT image reconstructions and MIPs were obtained to evaluate the vascular anatomy. Carotid stenosis measurements (when applicable) are obtained utilizing NASCET criteria, using the distal internal carotid diameter as the denominator. RADIATION DOSE REDUCTION: This exam was performed according to the departmental dose-optimization program which includes automated exposure control, adjustment of the mA and/or kV according to patient size and/or use of iterative reconstruction technique. CONTRAST:  75mL OMNIPAQUE IOHEXOL 350 MG/ML SOLN COMPARISON:  Head CT 01/23/2019. FINDINGS: CT HEAD FINDINGS Brain: No acute intracranial hemorrhage. Gray-white differentiation is preserved. No hydrocephalus or extra-axial collection. No mass effect or midline shift. Vascular: No hyperdense vessel or unexpected calcification. Skull: No calvarial fracture or suspicious bone lesion. Skull base is unremarkable. Sinuses/Orbits: No acute finding. Other: None. CTA NECK FINDINGS Aortic arch: Three-vessel arch configuration. Arch vessel origins are patent. Right carotid system: No evidence of dissection, stenosis (50% or greater), or occlusion. Left carotid system: No evidence of dissection, stenosis (50% or greater), or occlusion. Vertebral arteries: Codominant. No evidence of dissection, stenosis (50% or greater), or occlusion. 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  Other neck: Unremarkable. Upper chest: Unremarkable. Review of the MIP images confirms the above findings CTA HEAD FINDINGS Anterior circulation: Intracranial ICAs are patent without stenosis or aneurysm. Bilateral ACAs are patent proximally without stenosis or aneurysm. Multifocal narrowing of second and third order branches of the bilateral MCAs (sagittal images 13 and 28 series 15), which may be due to vasculitis or reversible cerebral vasoconstriction syndrome (RCVS). Posterior circulation: Normal basilar artery. The SCAs, AICAs and PICAs are patent proximally. The PCAs are patent proximally without stenosis or aneurysm. Distal branches are symmetric. Venous sinuses: As permitted by contrast timing, patent. Anatomic variants: None. Review of the MIP images confirms the above findings IMPRESSION: 1. No acute intracranial abnormality. 2. Multifocal narrowing of second and third order branches of the bilateral MCAs, which may be due to vasculitis or reversible cerebral vasoconstriction syndrome (RCVS). 3. No hemodynamically significant stenosis in the neck. 4. 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 Electronically Signed   By: Orvan Falconer M.D.   On: 08/10/2023 12:18     Labs:   Basic Metabolic Panel: Recent Labs  Lab 08/10/23 0841 08/10/23 0844  NA 140 138  K 4.0 3.9  CL 105 105  CO2  --  22  GLUCOSE 160* 160*  BUN 13 13  CREATININE 1.00 1.01  CALCIUM  --  9.0   GFR Estimated Creatinine Clearance: 132.1 mL/min (by C-G formula based on SCr of 1.01 mg/dL). Liver Function Tests: No results for input(s): "AST", "ALT", "ALKPHOS", "BILITOT", "PROT", "ALBUMIN" in the last 168 hours. No results for input(s): "LIPASE", "AMYLASE" in the last 168 hours. No results for input(s): "AMMONIA" in the last 168 hours. Coagulation profile No results for input(s): "INR", "PROTIME" in the  last 168 hours.  CBC: Recent Labs  Lab 08/10/23 0841 08/10/23 0844  WBC  --  8.0  NEUTROABS  --  4.8  HGB 17.0 16.7  HCT 50.0 51.1  MCV  --  91.9  PLT  --  247   Cardiac Enzymes: No results for input(s): "CKTOTAL", "CKMB", "CKMBINDEX", "TROPONINI" in the last 168 hours. BNP: Invalid input(s): "POCBNP" CBG: No results for input(s): "GLUCAP" in the last 168 hours. D-Dimer No results for input(s): "DDIMER" in the last 72 hours. Hgb A1c Recent Labs    08/11/23 0504  HGBA1C 5.5   Lipid Profile Recent Labs    08/11/23 0504  CHOL 311*  HDL 44  LDLCALC 232*  TRIG 176*  CHOLHDL 7.1   Thyroid function studies No results for input(s): "TSH", "T4TOTAL", "T3FREE", "THYROIDAB" in the last 72 hours.  Invalid input(s): "FREET3" Anemia work up No results for input(s): "VITAMINB12", "FOLATE", "FERRITIN", "TIBC", "IRON", "RETICCTPCT" in the last 72 hours. Microbiology No results found for this or any previous visit (from the past 240 hour(s)).  Time coordinating discharge: 45 minutes  Signed: Aiyannah Fayad  [facility] 08/11/2023, 2:56 PM

## 2023-08-11 NOTE — Progress Notes (Addendum)
STROKE TEAM PROGRESS NOTE   BRIEF HPI [name] is a 37 y.o. male with history ofbipolar, ADHD, PTSD, anxiety and depression, congenital spinal stenosis, vapes presenting to the emergency room with severe headache x 2 separate occurrences   First episode he reports he had some coughing and some vomiting when he abruptly developed a severe right-sided headache that was very debilitating associated with photophobia.  He said he took a lot of ibuprofen and the headache resolved.  This morning he woke up and sneezed with the return of debilitating headache numbness and pain on the right side of his face which prompted him to be evaluated in the emergency room.  NIH on Admission: 0  SIGNIFICANT HOSPITAL EVENTS CTA w/ multifocal narrowing of second and third brances of b/l MCAs CT and MRI negative   INTERIM HISTORY/SUBJECTIVE Patient reports right sided HA onset after coughing, diaphoresis nausea and vomiting Tuesday. Symptoms resolved within 1 hour and he took 2000 mg ibuprofen to mage symptoms. Right sided headache recurred Wednesday after coughing and was 7 out 10, and didn't improve with redosing ibuprofen.  Denies chronic history of headaches or migraines. Denies dysarthria, word finding difficulties, weakness or balance issues.   Headache persists. Patient reports some right sided sensory deficits due to spinal stenosis. Denies any other focal deficits. Significant other bedside and contributive to history. Discussed consulting psych for medication review. Patient concerned about holding psychiatric medications and wants his psychiatrist, NP Mozingo included in the discussion before considering alternatives.    OBJECTIVE  CBC    Component Value Date/Time   WBC 8.0 08/10/2023 0844   RBC 5.56 08/10/2023 0844   HGB 16.7 08/10/2023 0844   HCT 51.1 08/10/2023 0844   PLT 247 08/10/2023 0844   MCV 91.9 08/10/2023 0844   MCH 30.0 08/10/2023 0844   MCHC 32.7 08/10/2023 0844   RDW  13.6 08/10/2023 0844   LYMPHSABS 2.0 08/10/2023 0844   MONOABS 0.7 08/10/2023 0844   EOSABS 0.4 08/10/2023 0844   BASOSABS 0.0 08/10/2023 0844    BMET    Component Value Date/Time   NA 138 08/10/2023 0844   K 3.9 08/10/2023 0844   CL 105 08/10/2023 0844   CO2 22 08/10/2023 0844   GLUCOSE 160 (H) 08/10/2023 0844   BUN 13 08/10/2023 0844   CREATININE 1.01 08/10/2023 0844   CALCIUM 9.0 08/10/2023 0844   GFRNONAA >60 08/10/2023 0844    IMAGING past 24 hours MR BRAIN WO CONTRAST  Result Date: 08/11/2023 CLINICAL DATA:  Headache, neuro deficit, stroke suspected EXAM: MRI HEAD WITHOUT CONTRAST TECHNIQUE: Multiplanar, multiecho pulse sequences of the brain and surrounding structures were obtained without intravenous contrast. COMPARISON:  Available, correlation is made with 08/10/2023 CTA FINDINGS: Brain: No prior MRI no acute hemorrhage, mass, mass effect, or midline shift. No hydrocephalus or extra-axial collection. Pituitary and craniocervical junction within normal limits. No hemosiderin deposition to suggest remote hemorrhage. Vascular: Normal arterial flow voids. Skull and upper cervical spine: Normal marrow signal. Sinuses/Orbits: Mucosal thickening in the right frontal sinus, ethmoid air cells, right maxillary sinus, and right sphenoid sinus. No acute finding in the orbits. Other: The mastoid air cells are well aerated. IMPRESSION: No acute intracranial process. No evidence of acute or subacute infarct. Electronically Signed   By: Wiliam Ke M.D.   On: 08/11/2023 03:44   CT ANGIO HEAD NECK W WO CM  Result Date: 08/10/2023 CLINICAL DATA:  severe abrupt onset headache 24 hours ago radiating down his R neck.  EXAM: CT ANGIOGRAPHY HEAD AND NECK WITH AND WITHOUT CONTRAST TECHNIQUE: Multidetector CT imaging of the head and neck was performed using the standard protocol during bolus administration of intravenous contrast. Multiplanar CT image reconstructions and MIPs were obtained to  evaluate the vascular anatomy. Carotid stenosis measurements (when applicable) are obtained utilizing NASCET criteria, using the distal internal carotid diameter as the denominator. RADIATION DOSE REDUCTION: This exam was performed according to the departmental dose-optimization program which includes automated exposure control, adjustment of the mA and/or kV according to patient size and/or use of iterative reconstruction technique. CONTRAST:  75mL OMNIPAQUE IOHEXOL 350 MG/ML SOLN COMPARISON:  Head CT 01/23/2019. FINDINGS: CT HEAD FINDINGS Brain: No acute intracranial hemorrhage. Gray-white differentiation is preserved. No hydrocephalus or extra-axial collection. No mass effect or midline shift. Vascular: No hyperdense vessel or unexpected calcification. Skull: No calvarial fracture or suspicious bone lesion. Skull base is unremarkable. Sinuses/Orbits: No acute finding. Other: None. CTA NECK FINDINGS Aortic arch: Three-vessel arch configuration. Arch vessel origins are patent. Right carotid system: No evidence of dissection, stenosis (50% or greater), or occlusion. Left carotid system: No evidence of dissection, stenosis (50% or greater), or occlusion. Vertebral arteries: Codominant. No evidence of dissection, stenosis (50% or greater), or occlusion. 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 Other neck: Unremarkable. Upper chest: Unremarkable. Review of the MIP images confirms the above findings CTA HEAD FINDINGS Anterior circulation: Intracranial ICAs are patent without stenosis or aneurysm. Bilateral ACAs are patent proximally without stenosis or aneurysm. Multifocal narrowing of second and third order branches of the bilateral MCAs (sagittal images 13 and 28 series 15), which may be due to vasculitis or reversible cerebral vasoconstriction syndrome (RCVS). Posterior circulation: Normal  basilar artery. The SCAs, AICAs and PICAs are patent proximally. The PCAs are patent proximally without stenosis or aneurysm. Distal branches are symmetric. Venous sinuses: As permitted by contrast timing, patent. Anatomic variants: None. Review of the MIP images confirms the above findings IMPRESSION: 1. No acute intracranial abnormality. 2. Multifocal narrowing of second and third order branches of the bilateral MCAs, which may be due to vasculitis or reversible cerebral vasoconstriction syndrome (RCVS). 3. No hemodynamically significant stenosis in the neck. 4. 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 Electronically Signed   By: Orvan Falconer M.D.   On: 08/10/2023 12:18    Vitals:   08/10/23 2339 08/11/23 0031 08/11/23 0405 08/11/23 0800  BP: (!) 145/127 (!) 174/113 (!) 139/112 (!) 173/111  Pulse: (!) 104 98 91   Resp: 18  18 18   Temp: (!) 97.3 F (36.3 C)  98.7 F (37.1 C) 97.7 F (36.5 C)  TempSrc: Oral  Oral Oral  SpO2: 96%  96% 93%  Weight:      Height:         PHYSICAL EXAM General:  Alert, well-nourished, caucasian, male well-developed patient in no acute distress Psych:  Intermittently irritable when discussing holding/changing psych medications  CV: Regular rate and rhythm on monitor Respiratory:  Regular, unlabored respirations on room air  NEURO:  Mental Status: AA&Ox3, patient is able to give clear and coherent history Speech/Language: speech is without dysarthria or aphasia.  Comprehension intact.  Cranial Nerves:  II: PERRL. Visual fields full.  III, IV, VI: EOMI. Eyelids elevate symmetrically.  V: Decreased right sided sensation to light touch  VII: Face  is symmetrical resting and smiling VIII: hearing intact to voice. IX, X: Palate elevates symmetrically. Phonation is normal.  UE:[id number] shrug 5/5. XII: tongue is midline without  fasciculations. Motor: 5/5 strength to all muscle groups tested.  Tone: is normal and bulk is normal Sensation- IDecreased right sided sensation. Extinction absent to light touch to DSS.   Coordination: FTN intact bilaterally.No drift.  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 Etiology: Symptoms onset x2 after cough this week, w/ HA, nausea and vomting,  no prior history of headaches , migraines or use of triptans, patient also on several psychiatric medications, and concern for contribution in RCVS r/o CTA head & neck no acute intracranial abnormality, multifocal narrowing of 2nd and 3rd branches of bilateral MCAs, may be vasculitis vs reversible MRI negative for acute or subacute infarct HgbA1c 5.5 UDS: THC and Opioids  VTE prophylaxis - Lovenox  Topiramate 50 mg BID prn for headache ordered, toradol prn   Verapamil 40 mg q8h to reduce vasospasm Strict blood pressure control with systolic 120-140.  May use as needed IV labetalol hydralazine if needed Follow-up appointment with [facility] Dr. Lucia Gaskins for headache in 4 weeks   Repeat CTA Head in 8 weeks outpatient  Psych consult, to discuss medication alternatives, in setting of headaches and  RCVS    No antithrombotic prior to admission, no need at discharge  Disposition:  Pending, further work-up   Hypertension Home meds: None  Unstable Blood Pressure Goal: SBP 120-140 for first 24 hours  Hydralazine 10 mg q4h prn   Hyperlipidemia Home meds:  None  LDL 232, goal < 70 Add Crestor 40 mg daily   Continue statin at discharge  Former tobacco user  Discontinued cigarette use, now vapes       Ready to quit? N/A  Substance Abuse Patient uses THC  UDS positive for THC and Opioids      Ready to quit? N/A TOC consult for cessation placed  Other Stroke Risk Factors ETOH use, advised to drink no more than 2 drink(s) a day Obesity, Body mass index is 28.75 kg/m., BMI  >/= 30 associated with increased stroke risk, recommend weight loss, diet and exercise as appropriate   Other Active Problems Bipolar, ADHD, MDD and Anxiety: Holding home Abilify, Lithium, klonopin, Buspar, trazodone, psych consult for medication management  Hospital day # 1  I have personally obtained history,examined this patient, reviewed notes, independently viewed imaging studies, participated in medical decision making and plan of care.ROS completed by me personally and pertinent positives fully documented  I have made any additions or clarifications directly to the above note. Agree with note above.  Patient presents with 2 separate episodes of cough and sneezing related severe right hemicranial headache with nausea and photophobia which have lasted for a few days.  MRI brain is normal but CT angiogram shows mild vasospasm of the distal MCA vessels raising concern for RCVS.  Patient certainly has risk for this given multiple serotonergic medications he is taking for his psychiatric condition.  Recommend strict blood pressure control with systolic goal 120-140 and use verapamil for both hypertension and vasospasm.  Trial of Topamax 50 mg twice daily and if headache is not better may consider occipital nerve block injection. Plan repeat CT angiogram in 2 months to look for resolution of the middle cerebral artery branch narrowing.  From discussion with patient and his significant other r at the bedside and answered  questions.  Greater than 50% time during this 50-minute visit was spent in counseling and coordination of care about his post cough headache and discussion about CT angiogram findings and RCVS and treatment options Delia Heady, MD Medical Director [facility] Pager: [phone] 08/11/2023 2:04 PM  To contact Stroke Continuity provider, please refer to [email or web address]. After hours, contact General Neurology

## 2023-08-11 NOTE — TOC Initial Note (Signed)
Transition of Care [facility]) - Initial/Assessment Note    Patient Details  Name: Jay Montgomery MRN: [id number] Date of Birth: [birth year]  Transition of Care [facility]) CM/SW Contact:    Kermit Balo, RN Phone Number: 08/11/2023, 10:32 AM  Clinical Narrative:                  PCP: Malva Cogan Pt is without insurance but denies issues affording home medications. Pt lives with SO that works during the daytime. He does state his dad can check on him during the day if needed.  No issues with transportation.  He is concerned that his home medications may have had side effects that caused him to be at the hospital. He is awaiting psyche consult.  SO will provide transportation home when medically ready. TOC following.  Expected Discharge Plan: Home/Self Care Barriers to Discharge: Continued Medical Work up, Inadequate or no insurance   Patient Goals and CMS Choice            Expected Discharge Plan and Services   Discharge Planning Services: CM Consult   Living arrangements for the past 2 months: Single Family Home                                      Prior Living Arrangements/Services Living arrangements for the past 2 months: Single Family Home Lives with:: Significant Other Patient language and need for interpreter reviewed:: Yes Do you feel safe going back to the place where you live?: Yes            Criminal Activity/Legal Involvement Pertinent to Current Situation/Hospitalization: No - Comment as needed  Activities of Daily Living   ADL Screening (condition at time of admission) Independently performs ADLs?: Yes (appropriate for developmental age) Is the patient deaf or have difficulty hearing?: No Does the patient have difficulty seeing, even when wearing glasses/contacts?: No Does the patient have difficulty concentrating, remembering, or making decisions?: No  Permission Sought/Granted                  Emotional Assessment Appearance::  Appears stated age Attitude/Demeanor/Rapport: Engaged Affect (typically observed): Accepting Orientation: : Oriented to Self, Oriented to Place, Oriented to  Time, Oriented to Situation   Psych Involvement: No (comment)  Admission diagnosis:  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 Pharmacy:   [facility] [id number] - [address] [place] - [address] [place] [address] Phone: [phone] Fax: [phone]     Social Determinants of Health (SDOH) Social History: SDOH Screenings   Food Insecurity: Food Insecurity Present (08/10/2023)  Housing: Low Risk  (08/10/2023)  Transportation Needs: No Transportation Needs (08/10/2023)  Utilities: Not At Risk (08/10/2023)  Depression (PHQ2-9): Low Risk  (04/29/2020)  Social Connections: Unknown (06/20/2023)   Received from [facility]  Tobacco Use: Medium Risk (08/11/2023)   SDOH Interventions:     Readmission Risk Interventions     No data to display

## 2023-08-12 LAB — ANCA PROFILE
Anti-MPO Antibodies: <0.2 U (ref 0.0–0.9)
Anti-PR3 Antibodies: <0.2 U (ref 0.0–0.9)
Atypical P-ANCA titer: <1:20 {titer}
C-ANCA: <1:20 {titer}
P-ANCA: <1:20 {titer}

## 2023-08-12 LAB — ANTI-DNA ANTIBODY, DOUBLE-STRANDED: ds DNA Ab: <1 [IU]/mL (ref 0–9)

## 2023-08-12 LAB — RHEUMATOID FACTOR: Rheumatoid fact SerPl-aCnc: 10.8 [IU]/mL (ref <14.0)

## 2023-08-14 LAB — ANTINUCLEAR ANTIBODIES, IFA: ANA Ab, IFA: NEGATIVE

## 2023-08-17 ENCOUNTER — Ambulatory Visit (INDEPENDENT_AMBULATORY_CARE_PROVIDER_SITE_OTHER): Payer: Self-pay | Admitting: Adult Health

## 2023-08-17 ENCOUNTER — Encounter: Payer: Self-pay | Admitting: Adult Health

## 2023-08-17 DIAGNOSIS — F319 Bipolar disorder, unspecified: Secondary | ICD-10-CM

## 2023-08-17 DIAGNOSIS — F909 Attention-deficit hyperactivity disorder, unspecified type: Secondary | ICD-10-CM

## 2023-08-17 DIAGNOSIS — G4700 Insomnia, unspecified: Secondary | ICD-10-CM

## 2023-08-17 DIAGNOSIS — F411 Generalized anxiety disorder: Secondary | ICD-10-CM

## 2023-08-17 NOTE — Progress Notes (Signed)
BRION PRUET [id number] 1986-09-17 37 y.o.  Subjective:   Patient ID:  Jay Montgomery is a 37 y.o. (DOB [birth year]) male.  Chief Complaint: No chief complaint on file.   HPI Jay Montgomery presents to the office today for follow-up of GAD, insomnia, ADHD, and BPD 1.  Received short term disability benefits for a period of time. Was then terminated by employer. Reports filing for long term disability. Recent medical exam for disability - through a state physician.Reports initial denial of SSI disability. Working with Pensions consultant.  Describes mood today as "not too good". Pleasant. Denies tearfulness. Mood symptoms - reports some depression and anxiety. Denies irritability - "trying to be numb to everything".  Reports one recent panic attack. Reports worry, rumination and over thinking - financial situation. Stating "I feel like I'm managing - worried". Varying interest and motivation. Taking medications as prescribed, but is willing to consider other options.  Energy levels lower lately. Active, does not have a regular exercise routine. Enjoys some usual interests and activities. Married. Lives with wife and family. Appetite adequate. Weight stable - 229 pounds Reports sleeping difficulties over the past week. Focus and concentration stable - with Adderall. Completing tasks. Managing aspects of household. Disabled. Denies SI or HI. Denies AH or VH. Denies self harm. Denies substance use.   GAD-7    Flowsheet Row Office Visit from 02/15/2019 in [facility] Visit from 01/31/2019 in [facility] Visit from 01/25/2019 in [facility]  Total GAD-7 Score 3 11 18       PHQ2-9    Flowsheet Row Video Visit from 04/29/2020 in [facility] Visit from 01/25/2019 in [facility] Total Score 0 0  PHQ-9 Total Score 1 --      Flowsheet Row ED to Hosp-Admission (Discharged) from 08/10/2023 in [facility] ED from 07/12/2022 in [facility] Department at [facility] ED from 11/23/2021 in [facility] Department at [facility]  C-SSRS RISK CATEGORY No Risk No Risk No Risk        Review of Systems:  Review of Systems  Musculoskeletal:  Negative for gait problem.  Neurological:  Negative for tremors.  Psychiatric/Behavioral:         Please refer to HPI    Medications: I have reviewed the patient's current medications.  Current Outpatient Medications  Medication Sig Dispense Refill   amphetamine-dextroamphetamine (ADDERALL) 30 MG tablet Take 1 tablet by mouth 2 (two) times daily. 60 tablet 0   ARIPiprazole (ABILIFY) 10 MG tablet Take 1 tablet (10 mg total) by mouth 2 (two) times daily. 60 tablet 5   buPROPion (WELLBUTRIN XL) 150 MG 24 hr tablet Take one tablet every morning for 7 days, then increase to two tablets every morning. 60 tablet 5   busPIRone (BUSPAR) 10 MG tablet Take 1 tablet (10 mg total) by mouth 3 (three) times daily. 90 tablet 5   clonazePAM (KLONOPIN) 1 MG tablet TAKE 1 TABLET BY MOUTH 4 TIMES A DAY AS NEEDED FOR ANXIETY 120 tablet 2   divalproex (DEPAKOTE ER) 500 MG 24 hr tablet Take 1 tablet (500 mg total) by mouth daily. 30 tablet 0   lithium carbonate 150 MG capsule TAKE 1 CAPSULE BY MOUTH AT BEDTIME 30 capsule 2   topiramate (TOPAMAX) 50 MG tablet Take 1  tablet (50 mg total) by mouth 2 (two) times daily. 60 tablet 0   verapamil (CALAN) 40 MG tablet Take 1 tablet (40 mg total) by mouth every 8 (eight) hours. 90 tablet 0   No current facility-administered medications for this visit.    Medication Side Effects: None  Allergies:  Allergies  Allergen Reactions   Hydroxyzine Other (See Comments)    Dizziness, mental fogginess, nausea   Hydrocodone-Acetaminophen Hives, Itching  and Rash    Past Medical History:  Diagnosis Date   Anxiety    Chicken pox    Headache    due to CSF leak   Heart murmur    "as a child"   Spinal stenosis     Past Medical History, Surgical history, Social history, and Family history were reviewed and updated as appropriate.   Please see review of systems for further details on the patient's review from today.   Objective:   Physical Exam:  There were no vitals taken for this visit.  Physical Exam Constitutional:      General: He is not in acute distress. Musculoskeletal:        General: No deformity.  Neurological:     Mental Status: He is alert and oriented to person, place, and time.     Coordination: Coordination normal.  Psychiatric:        Attention and Perception: Attention and perception normal. He does not perceive auditory or visual hallucinations.        Mood and Affect: Mood normal. Mood is not anxious or depressed. Affect is not labile, blunt, angry or inappropriate.        Speech: Speech normal.        Behavior: Behavior normal.        Thought Content: Thought content normal. Thought content is not paranoid or delusional. Thought content does not include homicidal or suicidal ideation. Thought content does not include homicidal or suicidal plan.        Cognition and Memory: Cognition and memory normal.        Judgment: Judgment normal.     Comments: Insight intact     Lab Review:     Component Value Date/Time   NA 138 08/10/2023 0844   K 3.9 08/10/2023 0844   CL 105 08/10/2023 0844   CO2 22 08/10/2023 0844   GLUCOSE 160 (H) 08/10/2023 0844   BUN 13 08/10/2023 0844   CREATININE 1.01 08/10/2023 0844   CALCIUM 9.0 08/10/2023 0844   PROT 7.2 11/23/2021 0740   ALBUMIN 4.0 11/23/2021 0740   AST 25 11/23/2021 0740   ALT 24 11/23/2021 0740   ALKPHOS 74 11/23/2021 0740   BILITOT 0.6 11/23/2021 0740   GFRNONAA >60 08/10/2023 0844   GFRAA >60 06/23/2020 0620       Component Value Date/Time   WBC  8.0 08/10/2023 0844   RBC 5.56 08/10/2023 0844   HGB 16.7 08/10/2023 0844   HCT 51.1 08/10/2023 0844   PLT 247 08/10/2023 0844   MCV 91.9 08/10/2023 0844   MCH 30.0 08/10/2023 0844   MCHC 32.7 08/10/2023 0844   RDW 13.6 08/10/2023 0844   LYMPHSABS 2.0 08/10/2023 0844   MONOABS 0.7 08/10/2023 0844   EOSABS 0.4 08/10/2023 0844   BASOSABS 0.0 08/10/2023 0844    No results found for: "POCLITH", "LITHIUM"   No results found for: "PHENYTOIN", "PHENOBARB", "VALPROATE", "CBMZ"   .res Assessment: Plan:    Plan:  Nuerology appointment - 09/27/2023 to rule out RCVS  Abilify  20mg  daily to target mood instability.  Wellbutrin XL 300mg  daily - denies seizure history. Buspar 10mg  TID   Lithium 150mg  at bedtime - to target passive SI.   Klonopin 1mg  4 times daily  Adderall 30mg  BID   D/C Trazadone 150mg  at bedtime - discussed taper.   ADHD outside testing - tested 53 - adult ADHD in office today. Consider referral for psychological testing.  RTC 4 weeks  Monitor BP between visits  Discussed potential metabolic side effects associated with atypical antipsychotics, as well as potential risk for movement side effects. Advised pt to contact office if movement side effects occur.   Discussed potential benefits, risk, and side effects of benzodiazepines to include potential risk of tolerance and dependence, as well as possible drowsiness.  Advised patient not to drive if experiencing drowsiness and to take lowest possible effective dose to minimize risk of dependence and tolerance.   Discussed potential benefits, risks, and side effects of stimulants with patient to include increased heart rate, palpitations, insomnia, increased anxiety, increased irritability, or decreased appetite.  Instructed patient to contact office if experiencing any significant tolerability issues.  Diagnoses and all orders for this visit:  Bipolar I disorder (HCC)  Insomnia, unspecified type  Attention deficit  hyperactivity disorder (ADHD), unspecified ADHD type  Generalized anxiety disorder     Please see After Visit Summary for patient specific instructions.  Future Appointments  Date Time Provider Department Center  09/27/2023  9:00 AM Penumalli, Glenford Bayley, MD [facility] None    No orders of the defined types were placed in this encounter.   -------------------------------

## 2023-08-29 ENCOUNTER — Ambulatory Visit (INDEPENDENT_AMBULATORY_CARE_PROVIDER_SITE_OTHER): Payer: Self-pay | Admitting: Adult Health

## 2023-08-29 DIAGNOSIS — Z0389 Encounter for observation for other suspected diseases and conditions ruled out: Secondary | ICD-10-CM

## 2023-08-29 NOTE — Progress Notes (Signed)
Patient no show appointment. ? ?

## 2023-09-01 ENCOUNTER — Ambulatory Visit (INDEPENDENT_AMBULATORY_CARE_PROVIDER_SITE_OTHER): Payer: Medicaid Other | Admitting: Adult Health

## 2023-09-01 ENCOUNTER — Encounter: Payer: Self-pay | Admitting: Adult Health

## 2023-09-01 DIAGNOSIS — F411 Generalized anxiety disorder: Secondary | ICD-10-CM

## 2023-09-01 DIAGNOSIS — G4700 Insomnia, unspecified: Secondary | ICD-10-CM | POA: Diagnosis not present

## 2023-09-01 DIAGNOSIS — F319 Bipolar disorder, unspecified: Secondary | ICD-10-CM | POA: Diagnosis not present

## 2023-09-01 DIAGNOSIS — F909 Attention-deficit hyperactivity disorder, unspecified type: Secondary | ICD-10-CM | POA: Diagnosis not present

## 2023-09-01 NOTE — Progress Notes (Signed)
[name] [id number] Jul 05, 1986 37 y.o.  Subjective:   Patient ID:  Jay Montgomery is a 37 y.o. (DOB 27-Aug-1986) male.  Chief Complaint: No chief complaint on file.   HPI Keontaye Barbato Lundy presents to the office today for follow-up of GAD, insomnia, ADHD, and BPD 1.  Received short term disability benefits for a period of time. Was then terminated by employer. Reports filing for long term disability. Recent medical exam for disability - through a state physician. Reports initial denial of SSI disability - has appealed decision and is waiting for a disposition. Working with Pensions consultant.  Describes mood today as "not good". Pleasant. Denies tearfulness. Mood symptoms - reports depression most days - reports financial struggles "it's the holidays and we have no money". Reports anxiety at times - "more nervous". Reports irritability - lashing out - "I try not to get upset with the family".  Denies recent panic attack. Reports worry, rumination and over thinking - "worried about paying his bills". Stating "I feel like things are a struggle" - "having highs and lows". Reports medications are helpful. Reports varying interest and motivation. Taking medications as prescribed, but is willing to consider other options.  Energy levels lower "energy is way down. Active, does not have a regular exercise routine. Enjoys some usual interests and activities. Married. Lives with wife and family. Appetite adequate. Weight stable - 229 pounds Reports sleep has improved. Averages 12 hours a night. Reports some daytime napping.  Focus and concentration stable - with Adderall. Completing tasks. Managing minimal aspects of household - "I don't feel like it". Disabled. Denies SI or HI. Denies AH or VH. Denies self harm. Denies substance use.     GAD-7    Flowsheet Row Office Visit from 02/15/2019 in [facility] Visit from 01/31/2019 in [facility]  [facility] Visit from 01/25/2019 in [facility]  Total GAD-7 Score 3 11 18       PHQ2-9    Flowsheet Row Video Visit from 04/29/2020 in [facility] Visit from 01/25/2019 in [facility] Total Score 0 0  PHQ-9 Total Score 1 --      Flowsheet Row ED to Hosp-Admission (Discharged) from 08/10/2023 in [facility] from 07/12/2022 in [facility] Department at [facility] ED from 11/23/2021 in [facility] Emergency Department at [facility]  C-SSRS RISK CATEGORY No Risk No Risk No Risk        Review of Systems:  Review of Systems  Medications: I have reviewed the patient's current medications.  Current Outpatient Medications  Medication Sig Dispense Refill   amphetamine-dextroamphetamine (ADDERALL) 30 MG tablet Take 1 tablet by mouth 2 (two) times daily. 60 tablet 0   ARIPiprazole (ABILIFY) 10 MG tablet Take 1 tablet (10 mg total) by mouth 2 (two) times daily. 60 tablet 5   buPROPion (WELLBUTRIN XL) 150 MG 24 hr tablet Take one tablet every morning for 7 days, then increase to two tablets every morning. 60 tablet 5   busPIRone (BUSPAR) 10 MG tablet Take 1 tablet (10 mg total) by mouth 3 (three) times daily. 90 tablet 5   clonazePAM (KLONOPIN) 1 MG tablet TAKE 1 TABLET BY MOUTH 4 TIMES A DAY AS NEEDED FOR ANXIETY 120 tablet 2   divalproex (DEPAKOTE ER) 500 MG 24 hr tablet Take 1  tablet (500 mg total) by mouth daily. 30 tablet 0   lithium carbonate 150 MG capsule TAKE 1 CAPSULE BY MOUTH AT BEDTIME 30 capsule 2   topiramate (TOPAMAX) 50 MG tablet Take 1 tablet (50 mg total) by mouth 2 (two) times daily. 60 tablet 0   verapamil (CALAN) 40 MG tablet Take 1 tablet (40 mg total) by mouth every 8 (eight) hours. 90 tablet 0   No current facility-administered medications for this  visit.    Medication Side Effects: None  Allergies:  Allergies  Allergen Reactions   Hydroxyzine Other (See Comments)    Dizziness, mental fogginess, nausea   Hydrocodone-Acetaminophen Hives, Itching and Rash    Past Medical History:  Diagnosis Date   Anxiety    Chicken pox    Headache    due to CSF leak   Heart murmur    "as a child"   Spinal stenosis     Past Medical History, Surgical history, Social history, and Family history were reviewed and updated as appropriate.   Please see review of systems for further details on the patient's review from today.   Objective:   Physical Exam:  There were no vitals taken for this visit.  Physical Exam  Lab Review:     Component Value Date/Time   NA 138 08/10/2023 0844   K 3.9 08/10/2023 0844   CL 105 08/10/2023 0844   CO2 22 08/10/2023 0844   GLUCOSE 160 (H) 08/10/2023 0844   BUN 13 08/10/2023 0844   CREATININE 1.01 08/10/2023 0844   CALCIUM 9.0 08/10/2023 0844   PROT 7.2 11/23/2021 0740   ALBUMIN 4.0 11/23/2021 0740   AST 25 11/23/2021 0740   ALT 24 11/23/2021 0740   ALKPHOS 74 11/23/2021 0740   BILITOT 0.6 11/23/2021 0740   GFRNONAA >60 08/10/2023 0844   GFRAA >60 06/23/2020 0620       Component Value Date/Time   WBC 8.0 08/10/2023 0844   RBC 5.56 08/10/2023 0844   HGB 16.7 08/10/2023 0844   HCT 51.1 08/10/2023 0844   PLT 247 08/10/2023 0844   MCV 91.9 08/10/2023 0844   MCH 30.0 08/10/2023 0844   MCHC 32.7 08/10/2023 0844   RDW 13.6 08/10/2023 0844   LYMPHSABS 2.0 08/10/2023 0844   MONOABS 0.7 08/10/2023 0844   EOSABS 0.4 08/10/2023 0844   BASOSABS 0.0 08/10/2023 0844    No results found for: "POCLITH", "LITHIUM"   No results found for: "PHENYTOIN", "PHENOBARB", "VALPROATE", "CBMZ"   .res Assessment: Plan:    Plan:  Nuerology appointment - 09/26/2023 to rule out RCVS  Abilify 20mg  daily to target mood instability.  Wellbutrin XL 300mg  daily - denies seizure history. Buspar 10mg  TID    Lithium 150mg  at bedtime - to target passive SI.   Klonopin 1mg  4 times daily  Adderall 30mg  BID   Have d/c Trazadone and Adderall 20mg    Has appointment on 01/06 for a CT scan.  ADHD outside testing - tested 21 - adult ADHD. Consider referral for psychological testing.  RTC 4 weeks  Monitor BP between visits  Discussed potential metabolic side effects associated with atypical antipsychotics, as well as potential risk for movement side effects. Advised pt to contact office if movement side effects occur.   Discussed potential benefits, risk, and side effects of benzodiazepines to include potential risk of tolerance and dependence, as well as possible drowsiness.  Advised patient not to drive if experiencing drowsiness and to take lowest possible effective dose to minimize risk  of dependence and tolerance.   Discussed potential benefits, risks, and side effects of stimulants with patient to include increased heart rate, palpitations, insomnia, increased anxiety, increased irritability, or decreased appetite.  Instructed patient to contact office if experiencing any significant tolerability issues.  There are no diagnoses linked to this encounter.   Please see After Visit Summary for patient specific instructions.  Future Appointments  Date Time Provider Department Center  09/01/2023 10:40 AM Jadin Creque, Thereasa Solo, NP [facility] None  09/27/2023  9:00 AM Penumalli, Glenford Bayley, MD [facility] None    No orders of the defined types were placed in this encounter.   -------------------------------

## 2023-09-09 ENCOUNTER — Other Ambulatory Visit: Payer: Self-pay

## 2023-09-09 ENCOUNTER — Telehealth: Payer: Self-pay | Admitting: Adult Health

## 2023-09-09 DIAGNOSIS — F411 Generalized anxiety disorder: Secondary | ICD-10-CM

## 2023-09-09 DIAGNOSIS — F319 Bipolar disorder, unspecified: Secondary | ICD-10-CM

## 2023-09-09 DIAGNOSIS — F909 Attention-deficit hyperactivity disorder, unspecified type: Secondary | ICD-10-CM

## 2023-09-09 MED ORDER — BUPROPION HCL ER (XL) 150 MG PO TB24: ORAL_TABLET | ORAL | 0 refills | Status: DC

## 2023-09-09 MED ORDER — ARIPIPRAZOLE 10 MG PO TABS: 10.0000 mg | ORAL_TABLET | Freq: Two times a day (BID) | ORAL | 0 refills | Status: DC

## 2023-09-09 MED ORDER — AMPHETAMINE-DEXTROAMPHETAMINE 30 MG PO TABS: 30.0000 mg | ORAL_TABLET | Freq: Two times a day (BID) | ORAL | 0 refills | Status: DC

## 2023-09-09 MED ORDER — BUSPIRONE HCL 10 MG PO TABS: 10.0000 mg | ORAL_TABLET | Freq: Three times a day (TID) | ORAL | 0 refills | Status: DC

## 2023-09-09 MED ORDER — LITHIUM CARBONATE 150 MG PO CAPS: 150.0000 mg | ORAL_CAPSULE | Freq: Every day | ORAL | 0 refills | Status: DC

## 2023-09-09 NOTE — Telephone Encounter (Signed)
Pt's significant other is on DPR. She called at 11:46a requesting refills on all the meds from [place].  Send   Adderall Abilify Buproprion Buspar Clonazepam Lithium Carbonate  to  [facility] [address], [place] - [address], [place] [address] Phone: [phone]  Fax: [phone]     Next appt 1/9

## 2023-09-09 NOTE — Telephone Encounter (Signed)
Has a RF for clonazepam, pended all other requested medications to [facility].

## 2023-09-27 ENCOUNTER — Ambulatory Visit: Payer: Medicaid Other | Admitting: Diagnostic Neuroimaging

## 2023-09-27 ENCOUNTER — Encounter: Payer: Self-pay | Admitting: Diagnostic Neuroimaging

## 2023-09-27 VITALS — BP 156/104 | HR 67 | Ht 75.0 in | Wt 240.0 lb

## 2023-09-27 DIAGNOSIS — I67848 Other cerebrovascular vasospasm and vasoconstriction: Secondary | ICD-10-CM | POA: Diagnosis not present

## 2023-09-27 DIAGNOSIS — G4483 Primary cough headache: Secondary | ICD-10-CM | POA: Diagnosis not present

## 2023-09-27 DIAGNOSIS — G43109 Migraine with aura, not intractable, without status migrainosus: Secondary | ICD-10-CM

## 2023-09-27 NOTE — Progress Notes (Signed)
 [facility]  PATIENT: Jay Montgomery DOB: [birth year]  REFERRING CLINICIAN: Arlice Reichert, MD HISTORY FROM: patient  REASON FOR VISIT: new consult   HISTORICAL  CHIEF COMPLAINT:  Chief Complaint  Patient presents with   New Patient (Initial Visit)    Pt in 6 with wife Pt here for hospital f/u  Wife states pt not able to hear out of right ear Pt has increased confusion and some short term memory loss. 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  08/09/2023 patient had onset of cough and panic attack.  He developed nausea and vomiting.  He had sudden onset headache.  The next day he had an episode of sneezing and headache came back as well.  Patient went to the emergency room for evaluation.  MRI of the brain was unremarkable.  CTA of the head showed multifocal narrowing of intracranial vessels suspicious for cerebral vasoconstriction syndrome.  He was treated for hypertension and migraine.  Symptoms slightly improved.  He was discharged home.  He took his medications but then ran out of them.  He has not established with PCP yet.  Continues to have almost daily headaches with sensitivity to light and some visual disturbances.  Having memory and balance issues.  Having some numbness on the right side.    REVIEW OF SYSTEMS: Full 14 system review of systems performed and negative with exception of: as per HPI.  ALLERGIES: Allergies  Allergen Reactions   Hydroxyzine  Other (See Comments)    Dizziness, mental fogginess, nausea   Hydrocodone -Acetaminophen  Hives, Itching and Rash    HOME MEDICATIONS: Outpatient Medications Prior to Visit  Medication Sig Dispense Refill   amphetamine -dextroamphetamine  (ADDERALL) 30 MG tablet Take 1 tablet by mouth 2 (two) times daily. 60 tablet 0   ARIPiprazole  (ABILIFY ) 10 MG tablet Take 1 tablet (10 mg total) by mouth 2  (two) times daily. 60 tablet 0   buPROPion  (WELLBUTRIN  XL) 150 MG 24 hr tablet Take two tablets every morning. 60 tablet 0   busPIRone  (BUSPAR ) 10 MG tablet Take 1 tablet (10 mg total) by mouth 3 (three) times daily. 90 tablet 0   lithium  carbonate 150 MG capsule Take 1 capsule (150 mg total) by mouth at bedtime. 30 capsule 0   clonazePAM  (KLONOPIN ) 1 MG tablet TAKE 1 TABLET BY MOUTH 4 TIMES A DAY AS NEEDED FOR ANXIETY (Patient not taking: Reported on 09/27/2023) 120 tablet 2   divalproex  (DEPAKOTE  ER) 500 MG 24 hr tablet Take 1 tablet (500 mg total) by mouth daily. 30 tablet 0   topiramate  (TOPAMAX ) 50 MG tablet Take 1 tablet (50 mg total) by mouth 2 (two) times daily. 60 tablet 0   verapamil  (CALAN ) 40 MG tablet Take 1 tablet (40 mg total) by mouth every 8 (eight) hours. 90 tablet 0   No facility-administered medications prior to visit.    PAST MEDICAL HISTORY: Past Medical History:  Diagnosis Date   Anxiety    Chicken pox    Headache    due to CSF leak   Heart murmur    as a child   Spinal stenosis     PAST SURGICAL HISTORY: Past Surgical History:  Procedure Laterality Date   ADENOIDECTOMY     BACK SURGERY  08/17/2017   L4-L5   LUMBAR LAMINECTOMY/DECOMPRESSION MICRODISCECTOMY N/A 12/01/2017   Procedure: REPAIR OF CEREBROSPINAL FLUID LEAK and  Placement of Lumbar Drain;  Surgeon: Louis Shove, MD;  Location: [facility] OR;  Service: Neurosurgery;  Laterality: N/A;   TONSILLECTOMY      FAMILY HISTORY: Family History  Problem Relation Age of Onset   Arthritis Mother    Hypertension Mother    COPD Mother    Emphysema Mother    Bipolar disorder Mother    Arthritis Father    Diabetes Father    Bipolar disorder Sister    Schizophrenia Sister     SOCIAL HISTORY: Social History   Socioeconomic History   Marital status: Legally Separated    Spouse name: Not on file   Number of children: Not on file   Years of education: Not on file   Highest education level: Not on file   Occupational History   Occupation: Designer, Jewellery  Tobacco Use   Smoking status: Former    Current packs/day: 1.00    Types: Cigarettes   Smokeless tobacco: Never  Vaping Use   Vaping status: Former  Substance and Sexual Activity   Alcohol use: Yes    Comment: occassional   Drug use: No   Sexual activity: Yes    Partners: Female    Comment: wife  Other Topics Concern   Not on file  Social History Narrative   Pt lives with wife    Pt not working    Social Drivers of Corporate Investment Banker Strain: Not on file  Food Insecurity: Food Insecurity Present (08/10/2023)   Hunger Vital Sign    Worried About Running Out of Food in the Last Year: Never true    Ran Out of Food in the Last Year: Sometimes true  Transportation Needs: No Transportation Needs (08/10/2023)   PRAPARE - Administrator, Civil Service (Medical): No    Lack of Transportation (Non-Medical): No  Physical Activity: Not on file  Stress: Not on file  Social Connections: Unknown (06/20/2023)   [facility]   Social Network    Social Network: Not on file  Intimate Partner Violence: Not At Risk (08/10/2023)   Humiliation, Afraid, Rape, and Kick questionnaire    Fear of Current or Ex-Partner: No    Emotionally Abused: No    Physically Abused: No    Sexually Abused: No     PHYSICAL EXAM  GENERAL EXAM/CONSTITUTIONAL: Vitals:  Vitals:   09/27/23 0857  BP: (!) 156/104  Pulse: 67  Weight: 240 lb (108.9 kg)  Height: 6' 3 (1.905 m)   Body mass index is 30 kg/m. Wt Readings from Last 3 Encounters:  09/27/23 240 lb (108.9 kg)  08/10/23 230 lb (104.3 kg)  11/23/21 225 lb (102.1 kg)   Patient is in no distress; well developed, nourished and groomed; neck is supple  CARDIOVASCULAR: Examination of carotid arteries is normal; no carotid bruits Regular rate and rhythm, no murmurs Examination of peripheral vascular system by observation and palpation is  normal  EYES: Ophthalmoscopic exam of optic discs and posterior segments is normal; no papilledema or hemorrhages No results found.  MUSCULOSKELETAL: Gait, strength, tone, movements noted in Neurologic exam below  NEUROLOGIC: MENTAL STATUS:      No data to display         awake, alert, oriented to person, place and time recent and remote memory intact normal attention and concentration language fluent, comprehension intact, naming intact fund of knowledge appropriate  CRANIAL NERVE:  2nd - no papilledema on fundoscopic exam 2nd, 3rd, 4th, 6th - pupils  equal and reactive to light, visual fields full to confrontation, extraocular muscles intact, no nystagmus 5th - facial sensation symmetric 7th - facial strength symmetric 8th - hearing intact 9th - palate elevates symmetrically, uvula midline 11th - shoulder shrug symmetric 12th - tongue protrusion midline  MOTOR:  normal bulk and tone, full strength in the BUE, BLE  SENSORY:  normal and symmetric to light touch, pinprick, temperature, vibration  COORDINATION:  finger-nose-finger, fine finger movements normal  REFLEXES:  deep tendon reflexes present and symmetric  GAIT/STATION:  narrow based gait     DIAGNOSTIC DATA (LABS, IMAGING, TESTING) - I reviewed patient records, labs, notes, testing and imaging myself where available.  Lab Results  Component Value Date   WBC 8.0 08/10/2023   HGB 16.7 08/10/2023   HCT 51.1 08/10/2023   MCV 91.9 08/10/2023   PLT 247 08/10/2023      Component Value Date/Time   NA 138 08/10/2023 0844   K 3.9 08/10/2023 0844   CL 105 08/10/2023 0844   CO2 22 08/10/2023 0844   GLUCOSE 160 (H) 08/10/2023 0844   BUN 13 08/10/2023 0844   CREATININE 1.01 08/10/2023 0844   CALCIUM  9.0 08/10/2023 0844   PROT 7.2 11/23/2021 0740   ALBUMIN 4.0 11/23/2021 0740   AST 25 11/23/2021 0740   ALT 24 11/23/2021 0740   ALKPHOS 74 11/23/2021 0740   BILITOT 0.6 11/23/2021 0740   GFRNONAA >60  08/10/2023 0844   GFRAA >60 06/23/2020 0620   Lab Results  Component Value Date   CHOL 311 (H) 08/11/2023   HDL 44 08/11/2023   LDLCALC 232 (H) 08/11/2023   TRIG 176 (H) 08/11/2023   CHOLHDL 7.1 08/11/2023   Lab Results  Component Value Date   HGBA1C 5.5 08/11/2023   No results found for: [id number] Lab Results  Component Value Date   TSH 2.14 02/15/2019       ASSESSMENT AND PLAN  38 y.o. year old male here with:  Dx:  1. Cerebral vasoconstriction syndrome   2. Primary cough headache   3. Migraine with aura and without status migrainosus, not intractable     PLAN:  Primary cough headache with RCVS on CT angiogram head Etiology: Symptoms onset x 1 day after coughing / panic attack, w/ HA, nausea and vomiting; no prior history of headaches, migraines or use of triptans, patient also on several psychiatric medications, and concern for contribution in RCVS r/o CTA head & neck no acute intracranial abnormality, multifocal narrowing of 2nd and 3rd branches of bilateral MCAs, may be vasculitis vs reversible MRI negative for acute or subacute infarct HgbA1c 5.5 UDS: THC and Opioids  Ibuprofen , tylenol  as needed for headaches Repeat CTA head  Check MRI brain (worsening memory, balance, right hand weakness, headaches) Establish with PCP asap for hypertension, hyperlipidemia   Substance Abuse UDS was positive for THC and Opioids  Orders Placed This Encounter  Procedures   CT ANGIO HEAD W OR WO CONTRAST   MR BRAIN W WO CONTRAST   Return in about 6 months (around 03/26/2024) for with Dr. Rosemarie.   EDUARD FABIENE HANLON, MD 09/27/2023, 9:50 AM Certified in Neurology, Neurophysiology and Neuroimaging  [facility] [address] [place], [place] [address] [phone]

## 2023-09-27 NOTE — Patient Instructions (Addendum)
  Ibuprofen, tylenol as needed for headaches Repeat CTA head  Check MRI brain (worsening memory, balance, right hand weakness, headaches) Establish with PCP for hypertension, hyperlipidemia

## 2023-09-28 ENCOUNTER — Telehealth: Payer: Self-pay | Admitting: Diagnostic Neuroimaging

## 2023-09-28 NOTE — Telephone Encounter (Signed)
 MRI brain [facility] [place]: [id number] exp. 09/28/23-11/26/23  CTA head [facility]: [id number] exp. 09/28/23-11/26/23 sent to [facility] [facility] [phone]

## 2023-09-29 ENCOUNTER — Encounter: Payer: Self-pay | Admitting: Adult Health

## 2023-09-29 ENCOUNTER — Telehealth: Payer: Medicaid Other | Admitting: Adult Health

## 2023-09-29 DIAGNOSIS — Z79899 Other long term (current) drug therapy: Secondary | ICD-10-CM | POA: Diagnosis not present

## 2023-09-29 DIAGNOSIS — G4700 Insomnia, unspecified: Secondary | ICD-10-CM | POA: Diagnosis not present

## 2023-09-29 DIAGNOSIS — F909 Attention-deficit hyperactivity disorder, unspecified type: Secondary | ICD-10-CM

## 2023-09-29 DIAGNOSIS — F411 Generalized anxiety disorder: Secondary | ICD-10-CM

## 2023-09-29 DIAGNOSIS — F319 Bipolar disorder, unspecified: Secondary | ICD-10-CM | POA: Diagnosis not present

## 2023-09-29 MED ORDER — BUPROPION HCL ER (XL) 150 MG PO TB24: ORAL_TABLET | ORAL | 2 refills | Status: DC

## 2023-09-29 MED ORDER — LITHIUM CARBONATE 150 MG PO CAPS: 150.0000 mg | ORAL_CAPSULE | Freq: Every day | ORAL | 2 refills | Status: DC

## 2023-09-29 MED ORDER — BUSPIRONE HCL 10 MG PO TABS: 10.0000 mg | ORAL_TABLET | Freq: Three times a day (TID) | ORAL | 2 refills | Status: DC

## 2023-09-29 MED ORDER — ARIPIPRAZOLE 10 MG PO TABS: 10.0000 mg | ORAL_TABLET | Freq: Two times a day (BID) | ORAL | 2 refills | Status: DC

## 2023-09-29 MED ORDER — CLONAZEPAM 1 MG PO TABS: ORAL_TABLET | ORAL | 2 refills | Status: DC

## 2023-09-29 NOTE — Progress Notes (Signed)
 Jay Montgomery [id number] [birth year] 38 y.o.  Virtual Visit via Video Note  I connected with pt @ on 09/29/23 at 10:30 AM EST by a video enabled telemedicine application and verified that I am speaking with the correct person using two identifiers.   I discussed the limitations of evaluation and management by telemedicine and the availability of in person appointments. The patient expressed understanding and agreed to proceed.  I discussed the assessment and treatment plan with the patient. The patient was provided an opportunity to ask questions and all were answered. The patient agreed with the plan and demonstrated an understanding of the instructions.   The patient was advised to call back or seek an in-person evaluation if the symptoms worsen or if the condition fails to improve as anticipated.  I provided 25 minutes of non-face-to-face time during this encounter.  The patient was located at home.  The provider was located at [facility].   Angeline LOISE Sayers, NP   Subjective:   Patient ID:  Jay Montgomery is a 38 y.o. (DOB 09-19-1986) male.  Chief Complaint: No chief complaint on file.   HPI Jay Montgomery presents for follow-up of GAD, insomnia, ADHD, and BPD 1.  Describes mood today as "not the best". Pleasant. Denies tearfulness. Mood symptoms - reports depression most days - lacking interest and motivation. Reports anxiety at times - struggling to leave the house. Denies irritability. Denies recent panic attacks. Reports worry, rumination and over thinking - financial stressors. Reports mood as variable - having high highs and low lows. Stating I can be happy one day and a few days later not want to do nothing or be around anyone. Reports medications are helpful, but is willing to consider other options. Reports varying interest and motivation. Taking medications as prescribed, but is willing to consider other options.  Energy levels vary - more up  and down Active, does not have a regular exercise routine. Enjoys some usual interests and activities. Married. Lives with wife and family. Appetite adequate. Weight stable - 230 pounds Reports sleep has improved. Averages 4 to 6 hours a night - less sleep lately. Reports some daytime napping.  Focus and concentration stable - with Adderall. Completing tasks. Managing minimal aspects of household. Disabled. Denies SI or HI. Denies AH or VH. Denies self harm. Denies substance use.  Review of Systems:  Review of Systems  Musculoskeletal:  Negative for gait problem.  Neurological:  Negative for tremors.  Psychiatric/Behavioral:         Please refer to HPI    Medications: I have reviewed the patient's current medications.  Current Outpatient Medications  Medication Sig Dispense Refill   clonazePAM  (KLONOPIN ) 1 MG tablet Take one tablet four times daily as needed for anxiety and panic attacks. 120 tablet 2   ARIPiprazole  (ABILIFY ) 10 MG tablet Take 1 tablet (10 mg total) by mouth 2 (two) times daily. 60 tablet 2   buPROPion  (WELLBUTRIN  XL) 150 MG 24 hr tablet Take two tablets every morning. 60 tablet 2   busPIRone  (BUSPAR ) 10 MG tablet Take 1 tablet (10 mg total) by mouth 3 (three) times daily. 90 tablet 2   lithium  carbonate 150 MG capsule Take 1 capsule (150 mg total) by mouth at bedtime. 30 capsule 2   No current facility-administered medications for this visit.    Medication Side Effects: None  Allergies:  Allergies  Allergen Reactions   Hydroxyzine  Other (See Comments)    Dizziness, mental fogginess, nausea  Hydrocodone -Acetaminophen  Hives, Itching and Rash    Past Medical History:  Diagnosis Date   Anxiety    Chicken pox    Headache    due to CSF leak   Heart murmur    as a child   Spinal stenosis     Family History  Problem Relation Age of Onset   Arthritis Mother    Hypertension Mother    COPD Mother    Emphysema Mother    Bipolar disorder Mother     Arthritis Father    Diabetes Father    Bipolar disorder Sister    Schizophrenia Sister     Social History   Socioeconomic History   Marital status: Legally Separated    Spouse name: Not on file   Number of children: Not on file   Years of education: Not on file   Highest education level: Not on file  Occupational History   Occupation: Designer, Jewellery  Tobacco Use   Smoking status: Former    Current packs/day: 1.00    Types: Cigarettes   Smokeless tobacco: Never  Vaping Use   Vaping status: Former  Substance and Sexual Activity   Alcohol use: Yes    Comment: occassional   Drug use: No   Sexual activity: Yes    Partners: Female    Comment: wife  Other Topics Concern   Not on file  Social History Narrative   Pt lives with wife    Pt not working    Social Drivers of Corporate Investment Banker Strain: Not on file  Food Insecurity: Food Insecurity Present (08/10/2023)   Hunger Vital Sign    Worried About Running Out of Food in the Last Year: Never true    Ran Out of Food in the Last Year: Sometimes true  Transportation Needs: No Transportation Needs (08/10/2023)   PRAPARE - Administrator, Civil Service (Medical): No    Lack of Transportation (Non-Medical): No  Physical Activity: Not on file  Stress: Not on file  Social Connections: Unknown (06/20/2023)   Received from [facility]   Social Network    Social Network: Not on file  Intimate Partner Violence: Not At Risk (08/10/2023)   Humiliation, Afraid, Rape, and Kick questionnaire    Fear of Current or Ex-Partner: No    Emotionally Abused: No    Physically Abused: No    Sexually Abused: No    Past Medical History, Surgical history, Social history, and Family history were reviewed and updated as appropriate.   Please see review of systems for further details on the patient's review from today.   Objective:   Physical Exam:  There were no vitals taken for this visit.  Physical  Exam Constitutional:      General: He is not in acute distress. Musculoskeletal:        General: No deformity.  Neurological:     Mental Status: He is alert and oriented to person, place, and time.     Coordination: Coordination normal.  Psychiatric:        Attention and Perception: Attention and perception normal. He does not perceive auditory or visual hallucinations.        Mood and Affect: Affect is not labile, blunt, angry or inappropriate.        Speech: Speech normal.        Behavior: Behavior normal.        Thought Content: Thought content normal. Thought content is not paranoid or delusional. Thought  content does not include homicidal or suicidal ideation. Thought content does not include homicidal or suicidal plan.        Cognition and Memory: Cognition and memory normal.        Judgment: Judgment normal.     Comments: Insight intact     Lab Review:     Component Value Date/Time   NA 138 08/10/2023 0844   K 3.9 08/10/2023 0844   CL 105 08/10/2023 0844   CO2 22 08/10/2023 0844   GLUCOSE 160 (H) 08/10/2023 0844   BUN 13 08/10/2023 0844   CREATININE 1.01 08/10/2023 0844   CALCIUM  9.0 08/10/2023 0844   PROT 7.2 11/23/2021 0740   ALBUMIN 4.0 11/23/2021 0740   AST 25 11/23/2021 0740   ALT 24 11/23/2021 0740   ALKPHOS 74 11/23/2021 0740   BILITOT 0.6 11/23/2021 0740   GFRNONAA >60 08/10/2023 0844   GFRAA >60 06/23/2020 0620       Component Value Date/Time   WBC 8.0 08/10/2023 0844   RBC 5.56 08/10/2023 0844   HGB 16.7 08/10/2023 0844   HCT 51.1 08/10/2023 0844   PLT 247 08/10/2023 0844   MCV 91.9 08/10/2023 0844   MCH 30.0 08/10/2023 0844   MCHC 32.7 08/10/2023 0844   RDW 13.6 08/10/2023 0844   LYMPHSABS 2.0 08/10/2023 0844   MONOABS 0.7 08/10/2023 0844   EOSABS 0.4 08/10/2023 0844   BASOSABS 0.0 08/10/2023 0844    No results found for: POCLITH, LITHIUM    No results found for: PHENYTOIN, PHENOBARB, VALPROATE, CBMZ   .res Assessment: Plan:     Plan:  Next neurology appointment - 06/05/2024  Has appointment on 01/13 for a CT scan and MRI.  Abilify  20mg  daily to target mood instability.  Lithium  150mg  at bedtime - to target passive SI.  Wellbutrin  XL 300mg  daily - denies seizure history. Buspar  10mg  TID  Klonopin  1mg  4 times daily  D/C Adderall 30mg  BID for the next 2 weeks - monitor BP  D/C Trazadone and Adderall 20mg    ADHD outside testing - tested 84 - adult ADHD. Consider referral for psychological testing.  RTC 3/4 weeks  Discussed potential metabolic side effects associated with atypical antipsychotics, as well as potential risk for movement side effects. Advised pt to contact office if movement side effects occur.   Discussed potential benefits, risk, and side effects of benzodiazepines to include potential risk of tolerance and dependence, as well as possible drowsiness.  Advised patient not to drive if experiencing drowsiness and to take lowest possible effective dose to minimize risk of dependence and tolerance.   Discussed potential benefits, risks, and side effects of stimulants with patient to include increased heart rate, palpitations, insomnia, increased anxiety, increased irritability, or decreased appetite.  Instructed patient to contact office if experiencing any significant tolerability issues.   Diagnoses and all orders for this visit:  High risk medication use -     Lithium  level -     CMP and Liver -     TSH  Bipolar affective disorder, remission status unspecified (HCC) -     ARIPiprazole  (ABILIFY ) 10 MG tablet; Take 1 tablet (10 mg total) by mouth 2 (two) times daily. -     buPROPion  (WELLBUTRIN  XL) 150 MG 24 hr tablet; Take two tablets every morning.  Generalized anxiety disorder -     ARIPiprazole  (ABILIFY ) 10 MG tablet; Take 1 tablet (10 mg total) by mouth 2 (two) times daily. -     busPIRone  (BUSPAR ) 10 MG tablet;  Take 1 tablet (10 mg total) by mouth 3 (three) times daily. -      clonazePAM  (KLONOPIN ) 1 MG tablet; Take one tablet four times daily as needed for anxiety and panic attacks.  Attention deficit hyperactivity disorder (ADHD), unspecified ADHD type  Insomnia, unspecified type  Other orders -     lithium  carbonate 150 MG capsule; Take 1 capsule (150 mg total) by mouth at bedtime.     Please see After Visit Summary for patient specific instructions.  Future Appointments  Date Time Provider Department Center  10/03/2023  8:30 AM AP-CT 1 AP-CT [place] [facility]  10/03/2023  5:00 PM AP-MR 1 AP-MRI [place] [facility]  11/21/2023  9:00 AM Zarwolo, Gloria, FNP [facility]  06/05/2024  1:00 PM Rosemarie Eather RAMAN, MD [facility] None    Orders Placed This Encounter  Procedures   Lithium  level   CMP and Liver   TSH      -------------------------------

## 2023-10-03 ENCOUNTER — Ambulatory Visit (HOSPITAL_COMMUNITY)
Admission: RE | Admit: 2023-10-03 | Discharge: 2023-10-03 | Disposition: A | Discharge status: Home / Self Care | Payer: Medicaid Other | Source: Ambulatory Visit | Attending: Diagnostic Neuroimaging | Admitting: Diagnostic Neuroimaging

## 2023-10-03 ENCOUNTER — Ambulatory Visit (HOSPITAL_COMMUNITY)
Admission: RE | Admit: 2023-10-03 | Discharge: 2023-10-03 | Discharge status: Home / Self Care | Payer: Medicaid Other | Source: Ambulatory Visit | Attending: Diagnostic Neuroimaging | Admitting: Diagnostic Neuroimaging

## 2023-10-03 ENCOUNTER — Encounter (HOSPITAL_COMMUNITY): Payer: Self-pay | Admitting: Radiology

## 2023-10-03 DIAGNOSIS — R200 Anesthesia of skin: Secondary | ICD-10-CM | POA: Diagnosis not present

## 2023-10-03 DIAGNOSIS — I67848 Other cerebrovascular vasospasm and vasoconstriction: Secondary | ICD-10-CM | POA: Insufficient documentation

## 2023-10-03 DIAGNOSIS — R519 Headache, unspecified: Secondary | ICD-10-CM | POA: Diagnosis not present

## 2023-10-03 DIAGNOSIS — J329 Chronic sinusitis, unspecified: Secondary | ICD-10-CM | POA: Diagnosis not present

## 2023-10-03 DIAGNOSIS — I6601 Occlusion and stenosis of right middle cerebral artery: Secondary | ICD-10-CM | POA: Diagnosis not present

## 2023-10-03 DIAGNOSIS — R413 Other amnesia: Secondary | ICD-10-CM | POA: Diagnosis not present

## 2023-10-03 LAB — POCT I-STAT CREATININE: Creatinine, Ser: 1.2 mg/dL (ref 0.61–1.24)

## 2023-10-03 MED ORDER — IOHEXOL 350 MG/ML SOLN
75.0000 mL | Freq: Once | INTRAVENOUS | Status: AC | PRN
  Administered 2023-10-03: 75 mL via INTRAVENOUS

## 2023-10-03 MED ORDER — GADOBUTROL 1 MMOL/ML IV SOLN
10.0000 mL | Freq: Once | INTRAVENOUS | Status: AC | PRN
  Administered 2023-10-03: 10 mL via INTRAVENOUS

## 2023-10-05 NOTE — Progress Notes (Signed)
 Unremarkable MRI brain. -VRP

## 2023-10-05 NOTE — Progress Notes (Signed)
 CTA head is improved. -VRP

## 2023-10-06 ENCOUNTER — Telehealth: Payer: Self-pay

## 2023-10-06 NOTE — Telephone Encounter (Signed)
Prior Authorization submitted for Aripiprazole 10 mg #60 for 30 days, with [employer], approval received effective 10/06/2023-10/06/2023

## 2023-10-18 ENCOUNTER — Telehealth: Payer: Self-pay | Admitting: Adult Health

## 2023-10-18 NOTE — Telephone Encounter (Signed)
Patient's mom(Tasha) called in stating that [employer] needs letter for court informing them that he is still out of work. PH: [phone]

## 2023-10-19 NOTE — Telephone Encounter (Signed)
A letter was written on 09/01/23 for the courts pt is disabled and he will be reassessed on 10/05/23. Sounds like needing another letter written with updated dates.

## 2023-10-27 ENCOUNTER — Encounter: Payer: Self-pay | Admitting: Adult Health

## 2023-10-27 ENCOUNTER — Telehealth: Payer: Medicaid Other | Admitting: Adult Health

## 2023-10-27 DIAGNOSIS — F319 Bipolar disorder, unspecified: Secondary | ICD-10-CM | POA: Diagnosis not present

## 2023-10-27 DIAGNOSIS — F411 Generalized anxiety disorder: Secondary | ICD-10-CM

## 2023-10-27 DIAGNOSIS — G4700 Insomnia, unspecified: Secondary | ICD-10-CM

## 2023-10-27 DIAGNOSIS — F909 Attention-deficit hyperactivity disorder, unspecified type: Secondary | ICD-10-CM | POA: Diagnosis not present

## 2023-10-27 MED ORDER — AMPHETAMINE-DEXTROAMPHETAMINE 30 MG PO TABS: ORAL_TABLET | ORAL | 0 refills | Status: DC

## 2023-10-27 NOTE — Progress Notes (Signed)
 Jay Montgomery [id number] [birth year] 38 y.o.  Virtual Visit via Video Note  I connected with pt @ on 10/27/23 at 10:00 AM EST by a video enabled telemedicine application and verified that I am speaking with the correct person using two identifiers.   I discussed the limitations of evaluation and management by telemedicine and the availability of in person appointments. The patient expressed understanding and agreed to proceed.  I discussed the assessment and treatment plan with the patient. The patient was provided an opportunity to ask questions and all were answered. The patient agreed with the plan and demonstrated an understanding of the instructions.   The patient was advised to call back or seek an in-person evaluation if the symptoms worsen or if the condition fails to improve as anticipated.  I provided 25 minutes of non-face-to-face time during this encounter.  The patient was located at home.  The provider was located at [facility].   Angeline LOISE Sayers, NP   Subjective:   Patient ID:  Jay Montgomery is a 38 y.o. (DOB 1986-09-10) male.  Chief Complaint: No chief complaint on file.   HPI Jay Montgomery presents for follow-up of GAD, insomnia, ADHD, and BPD 1.  Describes mood today as "about the same". Pleasant. Denies tearfulness. Mood symptoms - reports depression - feeling down and out - lacking interest and motivation - it's low. Reports recent loss a friend due to house fire. Reports anxiety at times - trying to get out some. Denies irritability. Denies recent panic attacks. Reports worry, rumination and over thinking - financial stressors. Denies mania. Reports mood as variable - up and down. Stating I have been feeling down and out. Reports his physical health is effecting his mental health. Reports pain in neck and back. Reports completing simple tasks around the house are difficult. Reports medications are helpful, but is willing to  consider other options. Taking medications as prescribed, but is willing to consider other options.  Energy levels lower - planning to try and get out this weekend to see how he does outside the house. Active, does not have a regular exercise routine. Enjoys some usual interests and activities. Married. Lives with wife and family. Appetite adequate. Weight stable - 230 pounds Reports sleep is variable. Averages 5 to 6 hours a night. Denies daytime napping.  Focus and concentration stable - with Adderall. Completing tasks. Managing minimal aspects of household. Disabled. Denies SI or HI. Denies AH or VH. Denies self harm. Denies substance use.   Review of Systems:  Review of Systems  Musculoskeletal:  Negative for gait problem.  Neurological:  Negative for tremors.  Psychiatric/Behavioral:         Please refer to HPI    Medications: I have reviewed the patient's current medications.  Current Outpatient Medications  Medication Sig Dispense Refill   ARIPiprazole  (ABILIFY ) 10 MG tablet Take 1 tablet (10 mg total) by mouth 2 (two) times daily. 60 tablet 2   buPROPion  (WELLBUTRIN  XL) 150 MG 24 hr tablet Take two tablets every morning. 60 tablet 2   busPIRone  (BUSPAR ) 10 MG tablet Take 1 tablet (10 mg total) by mouth 3 (three) times daily. 90 tablet 2   clonazePAM  (KLONOPIN ) 1 MG tablet Take one tablet four times daily as needed for anxiety and panic attacks. 120 tablet 2   lithium  carbonate 150 MG capsule Take 1 capsule (150 mg total) by mouth at bedtime. 30 capsule 2   No current facility-administered medications for this visit.  Medication Side Effects: None  Allergies:  Allergies  Allergen Reactions   Hydroxyzine  Other (See Comments)    Dizziness, mental fogginess, nausea   Hydrocodone -Acetaminophen  Hives, Itching and Rash    Past Medical History:  Diagnosis Date   Anxiety    Chicken pox    Headache    due to CSF leak   Heart murmur    as a child   Spinal stenosis      Family History  Problem Relation Age of Onset   Arthritis Mother    Hypertension Mother    COPD Mother    Emphysema Mother    Bipolar disorder Mother    Arthritis Father    Diabetes Father    Bipolar disorder Sister    Schizophrenia Sister     Social History   Socioeconomic History   Marital status: Legally Separated    Spouse name: Not on file   Number of children: Not on file   Years of education: Not on file   Highest education level: Not on file  Occupational History   Occupation: Designer, Jewellery  Tobacco Use   Smoking status: Former    Current packs/day: 1.00    Types: Cigarettes   Smokeless tobacco: Never  Vaping Use   Vaping status: Former  Substance and Sexual Activity   Alcohol use: Yes    Comment: occassional   Drug use: No   Sexual activity: Yes    Partners: Female    Comment: wife  Other Topics Concern   Not on file  Social History Narrative   Pt lives with wife    Pt not working    Social Drivers of Corporate Investment Banker Strain: Not on file  Food Insecurity: Food Insecurity Present (08/10/2023)   Hunger Vital Sign    Worried About Running Out of Food in the Last Year: Never true    Ran Out of Food in the Last Year: Sometimes true  Transportation Needs: No Transportation Needs (08/10/2023)   PRAPARE - Administrator, Civil Service (Medical): No    Lack of Transportation (Non-Medical): No  Physical Activity: Not on file  Stress: Not on file  Social Connections: Unknown (06/20/2023)   Received from [facility]   Social Network    Social Network: Not on file  Intimate Partner Violence: Not At Risk (08/10/2023)   Humiliation, Afraid, Rape, and Kick questionnaire    Fear of Current or Ex-Partner: No    Emotionally Abused: No    Physically Abused: No    Sexually Abused: No    Past Medical History, Surgical history, Social history, and Family history were reviewed and updated as appropriate.   Please see review of  systems for further details on the patient's review from today.   Objective:   Physical Exam:  There were no vitals taken for this visit.  Physical Exam Constitutional:      General: He is not in acute distress. Musculoskeletal:        General: No deformity.  Neurological:     Mental Status: He is alert and oriented to person, place, and time.     Coordination: Coordination normal.  Psychiatric:        Attention and Perception: Attention and perception normal. He does not perceive auditory or visual hallucinations.        Mood and Affect: Mood is anxious and depressed. Affect is flat. Affect is not labile, blunt, angry or inappropriate.  Speech: Speech normal.        Behavior: Behavior normal.        Thought Content: Thought content normal. Thought content is not paranoid or delusional. Thought content does not include homicidal or suicidal ideation. Thought content does not include homicidal or suicidal plan.        Cognition and Memory: Cognition and memory normal.        Judgment: Judgment normal.     Comments: Insight intact     Lab Review:     Component Value Date/Time   NA 138 08/10/2023 0844   K 3.9 08/10/2023 0844   CL 105 08/10/2023 0844   CO2 22 08/10/2023 0844   GLUCOSE 160 (H) 08/10/2023 0844   BUN 13 08/10/2023 0844   CREATININE 1.20 10/03/2023 0838   CALCIUM  9.0 08/10/2023 0844   PROT 7.2 11/23/2021 0740   ALBUMIN 4.0 11/23/2021 0740   AST 25 11/23/2021 0740   ALT 24 11/23/2021 0740   ALKPHOS 74 11/23/2021 0740   BILITOT 0.6 11/23/2021 0740   GFRNONAA >60 08/10/2023 0844   GFRAA >60 06/23/2020 0620       Component Value Date/Time   WBC 8.0 08/10/2023 0844   RBC 5.56 08/10/2023 0844   HGB 16.7 08/10/2023 0844   HCT 51.1 08/10/2023 0844   PLT 247 08/10/2023 0844   MCV 91.9 08/10/2023 0844   MCH 30.0 08/10/2023 0844   MCHC 32.7 08/10/2023 0844   RDW 13.6 08/10/2023 0844   LYMPHSABS 2.0 08/10/2023 0844   MONOABS 0.7 08/10/2023 0844   EOSABS  0.4 08/10/2023 0844   BASOSABS 0.0 08/10/2023 0844    No results found for: POCLITH, LITHIUM    No results found for: PHENYTOIN, PHENOBARB, VALPROATE, CBMZ   .res Assessment: Plan:    Plan:  Next neurology appointment - 06/05/2024  Has appointment on 01/13 for a CT scan and MRI.  Abilify  20mg  daily to target mood instability.  Lithium  150mg  at bedtime - to target passive SI.  Wellbutrin  XL 300mg  daily - denies seizure history. Buspar  10mg  TID  Klonopin  1mg  4 times daily - taking 3 a day. Adderall 30mg  BID - has been taking later in the day which may be affecting sleep.  ADHD outside testing - tested 27 - adult ADHD. Consider referral for psychological testing.  RTC 3/4 weeks  25 minutes spent dedicated to the care of this patient on the date of this encounter to include pre-visit review of records, ordering of medication, post visit documentation, and face-to-face time with the patient discussing depression, anxiety and OCD. Discussed continuing current medication regimen.  Patient remains totally disabled and unable to work currently 10/27/2023 through 11/24/2023.  Discussed potential metabolic side effects associated with atypical antipsychotics, as well as potential risk for movement side effects. Advised pt to contact office if movement side effects occur.   Discussed potential benefits, risk, and side effects of benzodiazepines to include potential risk of tolerance and dependence, as well as possible drowsiness.  Advised patient not to drive if experiencing drowsiness and to take lowest possible effective dose to minimize risk of dependence and tolerance.   Discussed potential benefits, risks, and side effects of stimulants with patient to include increased heart rate, palpitations, insomnia, increased anxiety, increased irritability, or decreased appetite.  Instructed patient to contact office if experiencing any significant tolerability issues.   There are no  diagnoses linked to this encounter.   Please see After Visit Summary for patient specific instructions.  Future Appointments  Date  Time Provider Department Center  11/21/2023  9:00 AM Zarwolo, Gloria, FNP [facility]  06/05/2024  1:00 PM Rosemarie Eather RAMAN, MD [facility] None    No orders of the defined types were placed in this encounter.     -------------------------------

## 2023-11-13 ENCOUNTER — Telehealth: Payer: Self-pay

## 2023-11-13 NOTE — Telephone Encounter (Signed)
 Prior Authorization submitted for Aripiprazole 10 mg #60 for 30 days with [facility], [name] Case: [id number], Approved, Starts on: 11/13/2023 Coverage Ends on: 11/12/2024

## 2023-11-21 ENCOUNTER — Ambulatory Visit: Payer: Self-pay | Admitting: Family Medicine

## 2023-11-24 ENCOUNTER — Telehealth: Payer: Medicaid Other | Admitting: Adult Health

## 2023-11-24 ENCOUNTER — Encounter: Payer: Self-pay | Admitting: Adult Health

## 2023-11-24 DIAGNOSIS — F411 Generalized anxiety disorder: Secondary | ICD-10-CM

## 2023-11-24 DIAGNOSIS — F319 Bipolar disorder, unspecified: Secondary | ICD-10-CM | POA: Diagnosis not present

## 2023-11-24 DIAGNOSIS — F909 Attention-deficit hyperactivity disorder, unspecified type: Secondary | ICD-10-CM

## 2023-11-24 DIAGNOSIS — G4700 Insomnia, unspecified: Secondary | ICD-10-CM

## 2023-11-24 NOTE — Progress Notes (Signed)
 Jay Montgomery [id number] 1986/09/12 38 y.o.  Virtual Visit via Video Note  I connected with pt @ on 11/24/23 at  9:30 AM EST by a video enabled telemedicine application and verified that I am speaking with the correct person using two identifiers.   I discussed the limitations of evaluation and management by telemedicine and the availability of in person appointments. The patient expressed understanding and agreed to proceed.  I discussed the assessment and treatment plan with the patient. The patient was provided an opportunity to ask questions and all were answered. The patient agreed with the plan and demonstrated an understanding of the instructions.   The patient was advised to call back or seek an in-person evaluation if the symptoms worsen or if the condition fails to improve as anticipated.  I provided 25 minutes of non-face-to-face time during this encounter.  The patient was located at home.  The provider was located at [facility].   Dorothyann Gibbs, NP   Subjective:   Patient ID:  Jay Montgomery is a 38 y.o. (DOB 1986-02-02) male.  Chief Complaint: No chief complaint on file.   HPI Chue Berkovich Nowaczyk presents for follow-up of GAD, insomnia, ADHD, and BPD 1.  Describes mood today as "still about the same". Pleasant. Flat. Denies tearfulness. Mood symptoms - reports depression - "financial stressors". Reports lower interest and motivation. Reports procrastination - "taking longer to get things done". Reports anxiety at times. Reports some irritability. Reports recent panic attacks - reports one a week. Reports worry, rumination and over thinking - "I always over think things". Reports a day of mania - "buying random things and spending money he shouldn't have". Reports mood as variable - "mostly down with an up here and there". Stating "overall, I feel like I'm maintaining - trying to make it through the day". Feels like his physical health has a lot to do  with his mental health. Reports he has not been able to do a lot of things he enjoys due to pain. Stating "things aren't going to get any better for me physically and that bothers me". Reports 2 previous back surgeries. Reports completing simple tasks around the house are difficult. Reports mental health medications are helpful Taking medications as prescribed. Energy levels lower. Active, does not have a regular exercise routine with physical disabilities. Enjoys some usual interests and activities. Married. Lives with wife and family. Appetite adequate. Weight gain - 230 to 240 pounds Reports sleep is variable. Averages 5 to 6 hours of broken sleep. Reports daytime napping - 2 to 3 hours.  Focus and concentration stable - with Adderall. Completing tasks. Managing minimal aspects of household. Disabled. Denies SI or HI. Denies AH or VH. Denies self harm. Denies substance use.  Review of Systems:  Review of Systems  Musculoskeletal:  Negative for gait problem.  Neurological:  Negative for tremors.  Psychiatric/Behavioral:         Please refer to HPI    Medications: I have reviewed the patient's current medications.  Current Outpatient Medications  Medication Sig Dispense Refill   amphetamine-dextroamphetamine (ADDERALL) 30 MG tablet Take one tablet at 7am and then take one tablet at noon. 60 tablet 0   ARIPiprazole (ABILIFY) 10 MG tablet Take 1 tablet (10 mg total) by mouth 2 (two) times daily. 60 tablet 2   buPROPion (WELLBUTRIN XL) 150 MG 24 hr tablet Take two tablets every morning. 60 tablet 2   busPIRone (BUSPAR) 10 MG tablet Take 1 tablet (10  mg total) by mouth 3 (three) times daily. 90 tablet 2   clonazePAM (KLONOPIN) 1 MG tablet Take one tablet four times daily as needed for anxiety and panic attacks. 120 tablet 2   lithium carbonate 150 MG capsule Take 1 capsule (150 mg total) by mouth at bedtime. 30 capsule 2   No current facility-administered medications for this visit.     Medication Side Effects: None  Allergies:  Allergies  Allergen Reactions   Hydroxyzine Other (See Comments)    Dizziness, mental fogginess, nausea   Hydrocodone-Acetaminophen Hives, Itching and Rash    Past Medical History:  Diagnosis Date   Anxiety    Chicken pox    Headache    due to CSF leak   Heart murmur    "as a child"   Spinal stenosis     Family History  Problem Relation Age of Onset   Arthritis Mother    Hypertension Mother    COPD Mother    Emphysema Mother    Bipolar disorder Mother    Arthritis Father    Diabetes Father    Bipolar disorder Sister    Schizophrenia Sister     Social History   Socioeconomic History   Marital status: Legally Separated    Spouse name: Not on file   Number of children: Not on file   Years of education: Not on file   Highest education level: Some college, no degree  Occupational History   Occupation: Designer, jewellery  Tobacco Use   Smoking status: Former    Current packs/day: 1.00    Types: Cigarettes   Smokeless tobacco: Never  Vaping Use   Vaping status: Former  Substance and Sexual Activity   Alcohol use: Yes    Comment: occassional   Drug use: No   Sexual activity: Yes    Partners: Female    Comment: wife  Other Topics Concern   Not on file  Social History Narrative   Pt lives with wife    Pt not working    Social Drivers of Corporate investment banker Strain: High Risk (11/20/2023)   Overall Financial Resource Strain (CARDIA)    Difficulty of Paying Living Expenses: Very hard  Food Insecurity: Food Insecurity Present (11/20/2023)   Hunger Vital Sign    Worried About Running Out of Food in the Last Year: Often true    Ran Out of Food in the Last Year: Often true  Transportation Needs: No Transportation Needs (11/20/2023)   PRAPARE - Administrator, Civil Service (Medical): No    Lack of Transportation (Non-Medical): No  Physical Activity: Unknown (11/20/2023)   Exercise Vital Sign    Days  of Exercise per Week: 0 days    Minutes of Exercise per Session: Not on file  Stress: Stress Concern Present (11/20/2023)   [employer] of Occupational Health - Occupational Stress Questionnaire    Feeling of Stress : Very much  Social Connections: Moderately Isolated (11/20/2023)   Social Connection and Isolation Panel [NHANES]    Frequency of Communication with Friends and Family: More than three times a week    Frequency of Social Gatherings with Friends and Family: Once a week    Attends Religious Services: Never    Database administrator or Organizations: Yes    Attends Banker Meetings: Never    Marital Status: Separated  Intimate Partner Violence: Not At Risk (08/10/2023)   Humiliation, Afraid, Rape, and Kick questionnaire  Fear of Current or Ex-Partner: No    Emotionally Abused: No    Physically Abused: No    Sexually Abused: No    Past Medical History, Surgical history, Social history, and Family history were reviewed and updated as appropriate.   Please see review of systems for further details on the patient's review from today.   Objective:   Physical Exam:  There were no vitals taken for this visit.  Physical Exam Constitutional:      General: He is not in acute distress. Musculoskeletal:        General: No deformity.  Neurological:     Mental Status: He is alert and oriented to person, place, and time.     Coordination: Coordination normal.  Psychiatric:        Attention and Perception: Attention and perception normal. He does not perceive auditory or visual hallucinations.        Mood and Affect: Affect is not labile, blunt, angry or inappropriate.        Speech: Speech normal.        Behavior: Behavior normal.        Thought Content: Thought content normal. Thought content is not paranoid or delusional. Thought content does not include homicidal or suicidal ideation. Thought content does not include homicidal or suicidal plan.         Cognition and Memory: Cognition and memory normal.        Judgment: Judgment normal.     Comments: Insight intact     Lab Review:     Component Value Date/Time   NA 138 08/10/2023 0844   K 3.9 08/10/2023 0844   CL 105 08/10/2023 0844   CO2 22 08/10/2023 0844   GLUCOSE 160 (H) 08/10/2023 0844   BUN 13 08/10/2023 0844   CREATININE 1.20 10/03/2023 0838   CALCIUM 9.0 08/10/2023 0844   PROT 7.2 11/23/2021 0740   ALBUMIN 4.0 11/23/2021 0740   AST 25 11/23/2021 0740   ALT 24 11/23/2021 0740   ALKPHOS 74 11/23/2021 0740   BILITOT 0.6 11/23/2021 0740   GFRNONAA >60 08/10/2023 0844   GFRAA >60 06/23/2020 0620       Component Value Date/Time   WBC 8.0 08/10/2023 0844   RBC 5.56 08/10/2023 0844   HGB 16.7 08/10/2023 0844   HCT 51.1 08/10/2023 0844   PLT 247 08/10/2023 0844   MCV 91.9 08/10/2023 0844   MCH 30.0 08/10/2023 0844   MCHC 32.7 08/10/2023 0844   RDW 13.6 08/10/2023 0844   LYMPHSABS 2.0 08/10/2023 0844   MONOABS 0.7 08/10/2023 0844   EOSABS 0.4 08/10/2023 0844   BASOSABS 0.0 08/10/2023 0844    No results found for: "POCLITH", "LITHIUM"   No results found for: "PHENYTOIN", "PHENOBARB", "VALPROATE", "CBMZ"   .res Assessment: Plan:    Plan:  Refer to urgent care/PCP for reported elevated BP.  Next neurology appointment - 06/05/2024  Abilify 20mg  daily to target mood instability.  Lithium 150mg  at bedtime - to target passive SI.  Wellbutrin XL 300mg  daily - denies seizure history. Buspar 10mg  TID  Klonopin 1mg  4 times daily - taking 3 a day. Adderall 30mg  BID - has been taking later in the day which may be affecting sleep.  ADHD outside testing - tested 64 - adult ADHD. Consider referral for psychological testing.  RTC 3/4 weeks  25 minutes spent dedicated to the care of this patient on the date of this encounter to include pre-visit review of records, ordering of medication,  post visit documentation, and face-to-face time with the patient discussing  depression, anxiety and OCD. Discussed continuing current medication regimen.  Patient remains totally disabled and unable to work currently 11/24/2023 through 01/24/2024.  Discussed potential metabolic side effects associated with atypical antipsychotics, as well as potential risk for movement side effects. Advised pt to contact office if movement side effects occur.   Discussed potential benefits, risk, and side effects of benzodiazepines to include potential risk of tolerance and dependence, as well as possible drowsiness.  Advised patient not to drive if experiencing drowsiness and to take lowest possible effective dose to minimize risk of dependence and tolerance.   Discussed potential benefits, risks, and side effects of stimulants with patient to include increased heart rate, palpitations, insomnia, increased anxiety, increased irritability, or decreased appetite.  Instructed patient to contact office if experiencing any significant tolerability issues.   Diagnoses and all orders for this visit:  Bipolar I disorder (HCC)  Generalized anxiety disorder  Attention deficit hyperactivity disorder (ADHD), unspecified ADHD type  Insomnia, unspecified type     Please see After Visit Summary for patient specific instructions.  Future Appointments  Date Time Provider Department Center  01/23/2024 10:20 AM Gilmore Laroche, FNP [facility]  06/05/2024  1:00 PM Micki Riley, MD [facility] None    No orders of the defined types were placed in this encounter.     -------------------------------

## 2024-01-17 ENCOUNTER — Telehealth: Admitting: Adult Health

## 2024-01-17 ENCOUNTER — Encounter: Payer: Self-pay | Admitting: Adult Health

## 2024-01-17 DIAGNOSIS — F909 Attention-deficit hyperactivity disorder, unspecified type: Secondary | ICD-10-CM | POA: Diagnosis not present

## 2024-01-17 DIAGNOSIS — F411 Generalized anxiety disorder: Secondary | ICD-10-CM

## 2024-01-17 DIAGNOSIS — G4700 Insomnia, unspecified: Secondary | ICD-10-CM | POA: Diagnosis not present

## 2024-01-17 DIAGNOSIS — F319 Bipolar disorder, unspecified: Secondary | ICD-10-CM | POA: Diagnosis not present

## 2024-01-17 DIAGNOSIS — Z79899 Other long term (current) drug therapy: Secondary | ICD-10-CM | POA: Diagnosis not present

## 2024-01-17 MED ORDER — BUSPIRONE HCL 10 MG PO TABS: 10.0000 mg | ORAL_TABLET | Freq: Three times a day (TID) | ORAL | 2 refills | Status: DC

## 2024-01-17 MED ORDER — ARIPIPRAZOLE 10 MG PO TABS
10.0000 mg | ORAL_TABLET | Freq: Two times a day (BID) | ORAL | 2 refills | Status: DC
Start: 2024-01-17 — End: 2024-04-16

## 2024-01-17 MED ORDER — LITHIUM CARBONATE 150 MG PO CAPS
150.0000 mg | ORAL_CAPSULE | Freq: Every day | ORAL | 2 refills | Status: DC
Start: 2024-01-17 — End: 2024-04-16

## 2024-01-17 MED ORDER — CLONAZEPAM 1 MG PO TABS: ORAL_TABLET | ORAL | 2 refills | Status: DC

## 2024-01-17 MED ORDER — BUPROPION HCL ER (XL) 150 MG PO TB24: ORAL_TABLET | ORAL | 2 refills | Status: DC

## 2024-01-17 MED ORDER — AMPHETAMINE-DEXTROAMPHETAMINE 30 MG PO TABS: ORAL_TABLET | ORAL | 0 refills | Status: DC

## 2024-01-17 NOTE — Progress Notes (Signed)
 Jay Montgomery [id number] 1986/03/07 38 y.o.  Virtual Visit via Video Note  I connected with pt @ on 01/17/24 at  1:30 PM EDT by a video enabled telemedicine application and verified that I am speaking with the correct person using two identifiers.   I discussed the limitations of evaluation and management by telemedicine and the availability of in person appointments. The patient expressed understanding and agreed to proceed.  I discussed the assessment and treatment plan with the patient. The patient was provided an opportunity to ask questions and all were answered. The patient agreed with the plan and demonstrated an understanding of the instructions.   The patient was advised to call back or seek an in-person evaluation if the symptoms worsen or if the condition fails to improve as anticipated.  I provided 30 minutes of non-face-to-face time during this encounter.  The patient was located at home.  The provider was located at [facility].   Reagan Camera, NP   Subjective:   Patient ID:  Jay Montgomery is a 38 y.o. (DOB 07/02/1986) male.  Chief Complaint: No chief complaint on file.   HPI Jay Montgomery presents for follow-up of GAD, insomnia, ADHD, and BPD 1.  Describes mood today as "not much better". Pleasant. Flat. Denies tearfulness. Mood symptoms - reports depression - "financial stressors and physical health". Reports lower interest and motivation - "I can't do things I used to do". Reports wanting to stay home more - "having issues leaving the house". Reports anxiety - "financial struggles - getting to me". Reports  irritability - "some days are better than others". Reports recent panic attacks - 3 since last visit. Reports worry, rumination and over thinking - "I'm worried about paying the bills". Reports periods of mania. Reports mood as variable - "ups and downs - a lot of anger here lately". Stating "I feel like I'm going through the motions day to  day - I never know how I'm going to wake up". Reports his physical health has a lot to do with his mental health - "it effects me majorly". Stating "I am always in pain - hardly any relief". Taking medications as prescribed. Energy levels lower. Active, does not have a regular exercise routine with physical disabilities. Enjoys some usual interests and activities. Married. Lives with wife and family. Appetite adequate. Weight loss - 229 to 240 pounds. Reports sleep is variable. Reports a few nights of sleeping 8 hours. Averages 4 to 6 hours of broken sleep. Has stopped napping during the day.  Focus and concentration stable - with Adderall. Completing tasks - wife helping him with shower - personal care. Managing minimal aspects of household. Reports he is unable to work with current physical limitations and medical issues. Denies SI or HI. Denies AH or VH. Denies self harm. Denies substance use.  Review of Systems:  Review of Systems  Musculoskeletal:  Negative for gait problem.  Neurological:  Negative for tremors.  Psychiatric/Behavioral:         Please refer to HPI    Medications: I have reviewed the patient's current medications.  Current Outpatient Medications  Medication Sig Dispense Refill   amphetamine -dextroamphetamine  (ADDERALL) 30 MG tablet Take one tablet at 7am and then take one tablet at noon. 60 tablet 0   ARIPiprazole  (ABILIFY ) 10 MG tablet Take 1 tablet (10 mg total) by mouth 2 (two) times daily. 60 tablet 2   buPROPion  (WELLBUTRIN  XL) 150 MG 24 hr tablet Take two tablets every morning.  60 tablet 2   busPIRone  (BUSPAR ) 10 MG tablet Take 1 tablet (10 mg total) by mouth 3 (three) times daily. 90 tablet 2   clonazePAM  (KLONOPIN ) 1 MG tablet Take one tablet four times daily as needed for anxiety and panic attacks. 120 tablet 2   lithium  carbonate 150 MG capsule Take 1 capsule (150 mg total) by mouth at bedtime. 30 capsule 2   No current facility-administered medications  for this visit.    Medication Side Effects: None  Allergies:  Allergies  Allergen Reactions   Hydroxyzine  Other (See Comments)    Dizziness, mental fogginess, nausea   Hydrocodone -Acetaminophen  Hives, Itching and Rash    Past Medical History:  Diagnosis Date   Anxiety    Chicken pox    Headache    due to CSF leak   Heart murmur    "as a child"   Spinal stenosis     Family History  Problem Relation Age of Onset   Arthritis Mother    Hypertension Mother    COPD Mother    Emphysema Mother    Bipolar disorder Mother    Arthritis Father    Diabetes Father    Bipolar disorder Sister    Schizophrenia Sister     Social History   Socioeconomic History   Marital status: Legally Separated    Spouse name: Not on file   Number of children: Not on file   Years of education: Not on file   Highest education level: Some college, no degree  Occupational History   Occupation: Designer, jewellery  Tobacco Use   Smoking status: Former    Current packs/day: 1.00    Types: Cigarettes   Smokeless tobacco: Never  Vaping Use   Vaping status: Former  Substance and Sexual Activity   Alcohol use: Yes    Comment: occassional   Drug use: No   Sexual activity: Yes    Partners: Female    Comment: wife  Other Topics Concern   Not on file  Social History Narrative   Pt lives with wife    Pt not working    Social Drivers of Corporate investment banker Strain: High Risk (11/20/2023)   Overall Financial Resource Strain (CARDIA)    Difficulty of Paying Living Expenses: Very hard  Food Insecurity: Food Insecurity Present (11/20/2023)   Hunger Vital Sign    Worried About Running Out of Food in the Last Year: Often true    Ran Out of Food in the Last Year: Often true  Transportation Needs: No Transportation Needs (11/20/2023)   PRAPARE - Administrator, Civil Service (Medical): No    Lack of Transportation (Non-Medical): No  Physical Activity: Unknown (11/20/2023)   Exercise Vital  Sign    Days of Exercise per Week: 0 days    Minutes of Exercise per Session: Not on file  Stress: Stress Concern Present (11/20/2023)   [employer] of Occupational Health - Occupational Stress Questionnaire    Feeling of Stress : Very much  Social Connections: Moderately Isolated (11/20/2023)   Social Connection and Isolation Panel [NHANES]    Frequency of Communication with Friends and Family: More than three times a week    Frequency of Social Gatherings with Friends and Family: Once a week    Attends Religious Services: Never    Database administrator or Organizations: Yes    Attends Banker Meetings: Never    Marital Status: Separated  Intimate Partner Violence: Not  At Risk (08/10/2023)   Humiliation, Afraid, Rape, and Kick questionnaire    Fear of Current or Ex-Partner: No    Emotionally Abused: No    Physically Abused: No    Sexually Abused: No    Past Medical History, Surgical history, Social history, and Family history were reviewed and updated as appropriate.   Please see review of systems for further details on the patient's review from today.   Objective:   Physical Exam:  There were no vitals taken for this visit.  Physical Exam Constitutional:      General: He is not in acute distress. Musculoskeletal:        General: No deformity.  Neurological:     Mental Status: He is alert and oriented to person, place, and time.     Coordination: Coordination normal.  Psychiatric:        Attention and Perception: Attention and perception normal. He does not perceive auditory or visual hallucinations.        Mood and Affect: Mood normal. Affect is not labile, blunt, angry or inappropriate.        Speech: Speech normal.        Behavior: Behavior normal.        Thought Content: Thought content normal. Thought content is not paranoid or delusional. Thought content does not include homicidal or suicidal ideation. Thought content does not include homicidal or  suicidal plan.        Cognition and Memory: Cognition and memory normal.        Judgment: Judgment normal.     Comments: Insight intact     Lab Review:     Component Value Date/Time   NA 138 08/10/2023 0844   K 3.9 08/10/2023 0844   CL 105 08/10/2023 0844   CO2 22 08/10/2023 0844   GLUCOSE 160 (H) 08/10/2023 0844   BUN 13 08/10/2023 0844   CREATININE 1.20 10/03/2023 0838   CALCIUM  9.0 08/10/2023 0844   PROT 7.2 11/23/2021 0740   ALBUMIN 4.0 11/23/2021 0740   AST 25 11/23/2021 0740   ALT 24 11/23/2021 0740   ALKPHOS 74 11/23/2021 0740   BILITOT 0.6 11/23/2021 0740   GFRNONAA >60 08/10/2023 0844   GFRAA >60 06/23/2020 0620       Component Value Date/Time   WBC 8.0 08/10/2023 0844   RBC 5.56 08/10/2023 0844   HGB 16.7 08/10/2023 0844   HCT 51.1 08/10/2023 0844   PLT 247 08/10/2023 0844   MCV 91.9 08/10/2023 0844   MCH 30.0 08/10/2023 0844   MCHC 32.7 08/10/2023 0844   RDW 13.6 08/10/2023 0844   LYMPHSABS 2.0 08/10/2023 0844   MONOABS 0.7 08/10/2023 0844   EOSABS 0.4 08/10/2023 0844   BASOSABS 0.0 08/10/2023 0844    No results found for: "POCLITH", "LITHIUM "   No results found for: "PHENYTOIN", "PHENOBARB", "VALPROATE", "CBMZ"   .res Assessment: Plan:    Plan:  Reports BP 108/75/90 - has been checking regularly.  Next neurology appointment - 06/05/2024  Abilify  20mg  daily to target mood instability.  Lithium  150mg  at bedtime - to target passive SI.  Wellbutrin  XL 300mg  daily - denies seizure history. Buspar  10mg  TID  Klonopin  1mg  4 times daily - taking 3 a day. Adderall 30mg  BID   ADHD outside testing - tested 82 - adult ADHD. Consider referral for psychological testing.  RTC 3/4 weeks  30 minutes spent dedicated to the care of this patient on the date of this encounter to include pre-visit review of  records, ordering of medication, post visit documentation, and face-to-face time with the patient discussing depression, anxiety and OCD. Discussed  continuing current medication regimen.  Patient remains totally disabled and unable to work currently 01/17/2024 through 03/18/2024.  Discussed potential metabolic side effects associated with atypical antipsychotics, as well as potential risk for movement side effects. Advised pt to contact office if movement side effects occur.   Discussed potential benefits, risk, and side effects of benzodiazepines to include potential risk of tolerance and dependence, as well as possible drowsiness.  Advised patient not to drive if experiencing drowsiness and to take lowest possible effective dose to minimize risk of dependence and tolerance.   Discussed potential benefits, risks, and side effects of stimulants with patient to include increased heart rate, palpitations, insomnia, increased anxiety, increased irritability, or decreased appetite.  Instructed patient to contact office if experiencing any significant tolerability issues.   There are no diagnoses linked to this encounter.   Please see After Visit Summary for patient specific instructions.  Future Appointments  Date Time Provider Department Center  01/17/2024  1:30 PM Renaldo Gornick, Ursula Gardner, NP [facility] None  01/23/2024 10:20 AM Zarwolo, Gloria, FNP [facility]  04/09/2024  4:00 PM Lisabeth Rider, MD [facility] None    No orders of the defined types were placed in this encounter.     -------------------------------

## 2024-01-23 ENCOUNTER — Ambulatory Visit: Admitting: Family Medicine

## 2024-01-23 ENCOUNTER — Encounter: Payer: Self-pay | Admitting: Family Medicine

## 2024-01-23 VITALS — BP 144/102 | HR 111 | Ht 73.0 in | Wt 233.1 lb

## 2024-01-23 DIAGNOSIS — I10 Essential (primary) hypertension: Secondary | ICD-10-CM | POA: Insufficient documentation

## 2024-01-23 DIAGNOSIS — E038 Other specified hypothyroidism: Secondary | ICD-10-CM

## 2024-01-23 DIAGNOSIS — M48062 Spinal stenosis, lumbar region with neurogenic claudication: Secondary | ICD-10-CM

## 2024-01-23 DIAGNOSIS — Z1159 Encounter for screening for other viral diseases: Secondary | ICD-10-CM

## 2024-01-23 DIAGNOSIS — E7849 Other hyperlipidemia: Secondary | ICD-10-CM

## 2024-01-23 DIAGNOSIS — R7301 Impaired fasting glucose: Secondary | ICD-10-CM | POA: Diagnosis not present

## 2024-01-23 DIAGNOSIS — Z114 Encounter for screening for human immunodeficiency virus [HIV]: Secondary | ICD-10-CM

## 2024-01-23 DIAGNOSIS — E559 Vitamin D deficiency, unspecified: Secondary | ICD-10-CM

## 2024-01-23 DIAGNOSIS — Z23 Encounter for immunization: Secondary | ICD-10-CM | POA: Diagnosis not present

## 2024-01-23 MED ORDER — VERAPAMIL HCL 40 MG PO TABS: 40.0000 mg | ORAL_TABLET | Freq: Three times a day (TID) | ORAL | 1 refills | Status: DC

## 2024-01-23 MED ORDER — GABAPENTIN 300 MG PO CAPS: 300.0000 mg | ORAL_CAPSULE | Freq: Every day | ORAL | 3 refills | Status: AC

## 2024-01-23 MED ORDER — AMLODIPINE BESYLATE 5 MG PO TABS: 5.0000 mg | ORAL_TABLET | Freq: Every day | ORAL | 1 refills | Status: DC

## 2024-01-23 NOTE — Progress Notes (Signed)
 New Patient Office Visit  Subjective:  Patient ID: Jay Montgomery, male    DOB: [birth year]  Age: 38 y.o. MRN: [id number]  CC:  Chief Complaint  Patient presents with   Establish Care    Discuss Hypertension/ Physical  Has congenital spinal stenosis, he is needing a new physician that works w/ medicaid to treat symptoms. Has tingling and numbness in fingers and hands. Possibly related to back issue.     HPI Jay HEINLE is a 38 y.o. male with past medical history of  congenital stenosis,  presents for establishing care. For the details of today's visit, please refer to the assessment and plan.    Past Medical History:  Diagnosis Date   Allergy    Anxiety    Chicken pox    Depression    Headache    due to CSF leak   Heart murmur    "as a child"   Spinal stenosis     Past Surgical History:  Procedure Laterality Date   ADENOIDECTOMY     BACK SURGERY  08/17/2017   L4-L5   LUMBAR LAMINECTOMY/DECOMPRESSION MICRODISCECTOMY N/A 12/01/2017   Procedure: REPAIR OF CEREBROSPINAL FLUID LEAK and Placement of Lumbar Drain;  Surgeon: Jay Aldrich, MD;  Location: [facility] OR;  Service: Neurosurgery;  Laterality: N/A;   SPINE SURGERY  07-2017   2 back surgeries   TONSILLECTOMY      Family History  Problem Relation Age of Onset   Arthritis Mother    Hypertension Mother    COPD Mother    Emphysema Mother    Bipolar disorder Mother    Arthritis Father    Diabetes Father    Bipolar disorder Sister    Schizophrenia Sister    ADD / ADHD Sister    Anxiety disorder Sister     Social History   Socioeconomic History   Marital status: Legally Separated    Spouse name: [name]   Number of children: Not on file   Years of education: Not on file   Highest education level: Some college, no degree  Occupational History   Occupation: Designer, jewellery  Tobacco Use   Smoking status: Former    Current packs/day: 1.00    Average packs/day: 1 pack/day for 20.0 years (20.0 ttl  pk-yrs)    Types: Cigarettes   Smokeless tobacco: Never  Vaping Use   Vaping status: Some Days   Substances: Nicotine, CBD, Flavoring  Substance and Sexual Activity   Alcohol use: Not Currently    Comment: Haven't had alcohol in a year due to medication   Drug use: No    Comment: vapes w/ delta 8   Sexual activity: Yes    Partners: Female    Birth control/protection: Other-see comments    Comment: Essure  Other Topics Concern   Not on file  Social History Narrative   Pt lives with wife    Pt not working    Social Drivers of Corporate investment banker Strain: High Risk (01/22/2024)   Overall Financial Resource Strain (CARDIA)    Difficulty of Paying Living Expenses: Very hard  Food Insecurity: Food Insecurity Present (01/22/2024)   Hunger Vital Sign    Worried About Running Out of Food in the Last Year: Often true    Ran Out of Food in the Last Year: Often true  Transportation Needs: No Transportation Needs (01/22/2024)   PRAPARE - Transportation    Lack of Transportation (Medical): No    Lack of  Transportation (Non-Medical): No  Physical Activity: Unknown (01/22/2024)   Exercise Vital Sign    Days of Exercise per Week: 0 days    Minutes of Exercise per Session: Not on file  Stress: Stress Concern Present (01/22/2024)   [employer] of Occupational Health - Occupational Stress Questionnaire    Feeling of Stress : Very much  Social Connections: Socially Isolated (01/22/2024)   Social Connection and Isolation Panel [NHANES]    Frequency of Communication with Friends and Family: More than three times a week    Frequency of Social Gatherings with Friends and Family: Never    Attends Religious Services: Never    Database administrator or Organizations: No    Attends Banker Meetings: Never    Marital Status: Separated  Intimate Partner Violence: Not At Risk (08/10/2023)   Humiliation, Afraid, Rape, and Kick questionnaire    Fear of Current or Ex-Partner: No     Emotionally Abused: No    Physically Abused: No    Sexually Abused: No    ROS Review of Systems  Constitutional:  Negative for fatigue and fever.  Eyes:  Negative for visual disturbance.  Respiratory:  Negative for chest tightness and shortness of breath.   Cardiovascular:  Negative for chest pain and palpitations.  Musculoskeletal:  Positive for back pain.  Neurological:  Negative for dizziness and headaches.    Objective:   Today's Vitals: BP (!) 144/102   Pulse (!) 111   Ht 6\' 1"  (1.854 m)   Wt 233 lb 1.3 oz (105.7 kg)   SpO2 97%   BMI 30.75 kg/m   Physical Exam HENT:     Head: Normocephalic.     Right Ear: External ear normal.     Left Ear: External ear normal.     Nose: No congestion or rhinorrhea.     Mouth/Throat:     Mouth: Mucous membranes are moist.  Cardiovascular:     Rate and Rhythm: Regular rhythm.     Heart sounds: No murmur heard. Pulmonary:     Effort: No respiratory distress.     Breath sounds: Normal breath sounds.  Musculoskeletal:     Lumbar back: Tenderness present.  Neurological:     Mental Status: He is alert.      Assessment & Plan:   Primary hypertension Assessment & Plan: Uncontrolled Hypertension The patient is currently asymptomatic in the clinic but reports experiencing non-radiating chest pain accompanied by headaches. Initiation of therapy with verapamil  40 mg daily is planned.  A low-sodium diet of less than 2,300 mg per day is recommended, along with at least 150 minutes per week of moderate-intensity physical activity. The patient is encouraged to maintain these lifestyle modifications to help manage her blood pressure effectively.  Long-term considerations were discussed, emphasizing that uncontrolled hypertension increases the risk of cardiovascular diseases, including stroke, coronary artery disease, and heart failure.  The patient is advised to seek emergency care if her blood pressure exceeds 180/120 mmHg and is  accompanied by symptoms such as headaches, chest pain, palpitations, blurred vision, or dizziness. She verbalized understanding and will follow up as scheduled.   Orders: -     Verapamil  HCl; Take 1 tablet (40 mg total) by mouth 3 (three) times daily.  Dispense: 30 tablet; Refill: 1  Spinal stenosis of lumbar region with neurogenic claudication Assessment & Plan: Chronic Condition Reviewed MRI of the lumbar spine reveals a disc protrusion causing compression on the right side of the spinal cord, which  is likely contributing to his numbness and tingling. The patient reports constant lower back pain rated at 8/10. The pain worsens with prolonged sitting, standing, bending, and lifting. He reports minimal relief from prior epidural injections and has undergone two back surgeries--one in 2018 and a second in 2019 for a CSF leak. He denies bowel or bladder incontinence.  A referral will be placed to neurosurgery and pain management. The patient is encouraged to start gabapentin  300 mg at bedtime. The frequency may be increased if minimal relief is achieved with the current regimen.   Orders: -     Ambulatory referral to Neurosurgery -     Gabapentin ; Take 1 capsule (300 mg total) by mouth at bedtime.  Dispense: 90 capsule; Refill: 3 -     Ambulatory referral to Pain Clinic  Encounter for immunization -     Tdap vaccine greater than or equal to 7yo IM  IFG (impaired fasting glucose) -     Hemoglobin A1c  Vitamin D deficiency -     VITAMIN D 25 Hydroxy (Vit-D Deficiency, Fractures)  TSH (thyroid -stimulating hormone deficiency) -     TSH + free T4  Other hyperlipidemia -     Lipid panel -     CMP14+EGFR -     CBC with Differential/Platelet  Encounter for screening for HIV -     HIV Antibody (routine testing w rflx)  Need for hepatitis C screening test -     Hepatitis C antibody    Note: This chart has been completed using Engineer, civil (consulting) software, and while attempts have  been made to ensure accuracy, certain words and phrases may not be transcribed as intended.   Follow-up: Return in about 1 month (around 02/23/2024).   Eldin Bonsell, FNP

## 2024-01-23 NOTE — Patient Instructions (Addendum)
 I appreciate the opportunity to provide care to you today!    Follow up:  1 months  Labs: please stop by the lab today to get your blood drawn (CBC, CMP, TSH, Lipid profile, HgA1c, Vit D)  Hypertension Management  Your current blood pressure is above the target goal of <140/90 mmHg. To address this, please start taking  verapamil  40 mg daily   Medication Instructions: Take your blood pressure medication at the same time each day. After taking your medication, check your blood pressure at least an hour later. If your first reading is >140/90 mmHg, wait at least 10 minutes and recheck your blood pressure. Side Effects: In the initial days of therapy, you may experience dizziness or lightheadedness as your body adjusts to the lower blood pressure; this is expected. Diet and Lifestyle: Adhere to a low-sodium diet, limiting intake to less than 1500 mg daily, and increase your physical activity. Avoid over-the-counter NSAIDs such as ibuprofen  and naproxen  while on this medication. Hydration and Nutrition: Stay well-hydrated by drinking at least 64 ounces of water daily. Increase your servings of fruits and vegetables and avoid excessive sodium in your diet. Long-Term Considerations: Uncontrolled hypertension can increase the risk of cardiovascular diseases, including stroke, coronary artery disease, and heart failure.  Please report to the emergency department if your blood pressure exceeds 180/120 and is accompanied by symptoms such as headaches, chest pain, palpitations, blurred vision, or dizziness.   Referrals today-  pain management and Neurosurgery   Attached with your AVS, you will find valuable resources for self-education. I highly recommend dedicating some time to thoroughly examine them.   Please continue to a heart-healthy diet and increase your physical activities. Try to exercise for at least five days a week.    It was a pleasure to see you and I look forward to continuing  to work together on your health and well-being. Please do not hesitate to call the office if you need care or have questions about your care.  In case of emergency, please visit the Emergency Department for urgent care, or contact our clinic at [phone] to schedule an appointment. We're here to help you!   Have a wonderful day and week. With Gratitude, Jackelynn Hosie MSN, FNP-BC

## 2024-01-23 NOTE — Assessment & Plan Note (Signed)
 Chronic Condition Reviewed MRI of the lumbar spine reveals a disc protrusion causing compression on the right side of the spinal cord, which is likely contributing to his numbness and tingling. The patient reports constant lower back pain rated at 8/10. The pain worsens with prolonged sitting, standing, bending, and lifting. He reports minimal relief from prior epidural injections and has undergone two back surgeries--one in 2018 and a second in 2019 for a CSF leak. He denies bowel or bladder incontinence.  A referral will be placed to neurosurgery and pain management. The patient is encouraged to start gabapentin  300 mg at bedtime. The frequency may be increased if minimal relief is achieved with the current regimen.

## 2024-01-23 NOTE — Progress Notes (Deleted)
 Established Patient Office Visit  Subjective:  Patient ID: Jay Montgomery, male    DOB: 02/11/1986  Age: 38 y.o. MRN: [id number]  CC:  Chief Complaint  Patient presents with   Establish Care    Discuss Hypertension/ Physical  Has congenital spinal stenosis, he is needing a new physician that works w/ medicaid to treat symptoms. Has tingling and numbness in fingers and hands. Possibly related to back issue.     HPI Jay Montgomery is a 38 y.o. male with past medical history of *** presents for f/u of *** chronic medical conditions.  Past Medical History:  Diagnosis Date   Allergy    Anxiety    Chicken pox    Depression    Headache    due to CSF leak   Heart murmur    "as a child"   Spinal stenosis     Past Surgical History:  Procedure Laterality Date   ADENOIDECTOMY     BACK SURGERY  08/17/2017   L4-L5   LUMBAR LAMINECTOMY/DECOMPRESSION MICRODISCECTOMY N/A 12/01/2017   Procedure: REPAIR OF CEREBROSPINAL FLUID LEAK and Placement of Lumbar Drain;  Surgeon: Agustina Aldrich, MD;  Location: [facility] OR;  Service: Neurosurgery;  Laterality: N/A;   SPINE SURGERY  07-2017   2 back surgeries   TONSILLECTOMY      Family History  Problem Relation Age of Onset   Arthritis Mother    Hypertension Mother    COPD Mother    Emphysema Mother    Bipolar disorder Mother    Arthritis Father    Diabetes Father    Bipolar disorder Sister    Schizophrenia Sister    ADD / ADHD Sister    Anxiety disorder Sister     Social History   Socioeconomic History   Marital status: Legally Separated    Spouse name: [name]   Number of children: Not on file   Years of education: Not on file   Highest education level: Some college, no degree  Occupational History   Occupation: Designer, jewellery  Tobacco Use   Smoking status: Former    Current packs/day: 1.00    Average packs/day: 1 pack/day for 20.0 years (20.0 ttl pk-yrs)    Types: Cigarettes   Smokeless tobacco: Never  Vaping Use    Vaping status: Some Days   Substances: Nicotine, CBD, Flavoring  Substance and Sexual Activity   Alcohol use: Not Currently    Comment: Haven't had alcohol in a year due to medication   Drug use: No    Comment: vapes w/ delta 8   Sexual activity: Yes    Partners: Female    Birth control/protection: Other-see comments    Comment: Essure  Other Topics Concern   Not on file  Social History Narrative   Pt lives with wife    Pt not working    Social Drivers of Corporate investment banker Strain: High Risk (01/22/2024)   Overall Financial Resource Strain (CARDIA)    Difficulty of Paying Living Expenses: Very hard  Food Insecurity: Food Insecurity Present (01/22/2024)   Hunger Vital Sign    Worried About Running Out of Food in the Last Year: Often true    Ran Out of Food in the Last Year: Often true  Transportation Needs: No Transportation Needs (01/22/2024)   PRAPARE - Administrator, Civil Service (Medical): No    Lack of Transportation (Non-Medical): No  Physical Activity: Unknown (01/22/2024)   Exercise Vital Sign  Days of Exercise per Week: 0 days    Minutes of Exercise per Session: Not on file  Stress: Stress Concern Present (01/22/2024)   [employer] of Occupational Health - Occupational Stress Questionnaire    Feeling of Stress : Very much  Social Connections: Socially Isolated (01/22/2024)   Social Connection and Isolation Panel [NHANES]    Frequency of Communication with Friends and Family: More than three times a week    Frequency of Social Gatherings with Friends and Family: Never    Attends Religious Services: Never    Database administrator or Organizations: No    Attends Banker Meetings: Never    Marital Status: Separated  Intimate Partner Violence: Not At Risk (08/10/2023)   Humiliation, Afraid, Rape, and Kick questionnaire    Fear of Current or Ex-Partner: No    Emotionally Abused: No    Physically Abused: No    Sexually Abused: No     Outpatient Medications Prior to Visit  Medication Sig Dispense Refill   amphetamine -dextroamphetamine  (ADDERALL) 30 MG tablet Take one tablet at 7am and then take one tablet at noon. 60 tablet 0   ARIPiprazole  (ABILIFY ) 10 MG tablet Take 1 tablet (10 mg total) by mouth 2 (two) times daily. 60 tablet 2   buPROPion  (WELLBUTRIN  XL) 150 MG 24 hr tablet Take two tablets every morning. 60 tablet 2   busPIRone  (BUSPAR ) 10 MG tablet Take 1 tablet (10 mg total) by mouth 3 (three) times daily. 90 tablet 2   clonazePAM  (KLONOPIN ) 1 MG tablet Take one tablet four times daily as needed for anxiety and panic attacks. 120 tablet 2   lithium  carbonate 150 MG capsule Take 1 capsule (150 mg total) by mouth at bedtime. 30 capsule 2   No facility-administered medications prior to visit.    Allergies  Allergen Reactions   Hydroxyzine  Other (See Comments)    Dizziness, mental fogginess, nausea   Hydrocodone -Acetaminophen  Hives, Itching and Rash    ROS Review of Systems    Objective:    Physical Exam  BP (!) 144/102   Pulse (!) 111   Ht 6\' 1"  (1.854 m)   Wt 233 lb 1.3 oz (105.7 kg)   SpO2 97%   BMI 30.75 kg/m  Wt Readings from Last 3 Encounters:  01/23/24 233 lb 1.3 oz (105.7 kg)  09/27/23 240 lb (108.9 kg)  08/10/23 230 lb (104.3 kg)    Lab Results  Component Value Date   TSH 2.14 02/15/2019   Lab Results  Component Value Date   WBC 8.0 08/10/2023   HGB 16.7 08/10/2023   HCT 51.1 08/10/2023   MCV 91.9 08/10/2023   PLT 247 08/10/2023   Lab Results  Component Value Date   NA 138 08/10/2023   K 3.9 08/10/2023   CO2 22 08/10/2023   GLUCOSE 160 (H) 08/10/2023   BUN 13 08/10/2023   CREATININE 1.20 10/03/2023   BILITOT 0.6 11/23/2021   ALKPHOS 74 11/23/2021   AST 25 11/23/2021   ALT 24 11/23/2021   PROT 7.2 11/23/2021   ALBUMIN 4.0 11/23/2021   CALCIUM  9.0 08/10/2023   ANIONGAP 11 08/10/2023   GFR 87.77 02/15/2019   Lab Results  Component Value Date   CHOL 311 (H)  08/11/2023   Lab Results  Component Value Date   HDL 44 08/11/2023   Lab Results  Component Value Date   LDLCALC 232 (H) 08/11/2023   Lab Results  Component Value Date   TRIG 176 (H)  08/11/2023   Lab Results  Component Value Date   CHOLHDL 7.1 08/11/2023   Lab Results  Component Value Date   HGBA1C 5.5 08/11/2023      Assessment & Plan:  There are no diagnoses linked to this encounter.  Follow-up: No follow-ups on file.   Rosalena Mccorry, FNP

## 2024-01-23 NOTE — Assessment & Plan Note (Signed)
 Uncontrolled Hypertension The patient is currently asymptomatic in the clinic but reports experiencing non-radiating chest pain accompanied by headaches. Initiation of therapy with verapamil  40 mg daily is planned.  A low-sodium diet of less than 2,300 mg per day is recommended, along with at least 150 minutes per week of moderate-intensity physical activity. The patient is encouraged to maintain these lifestyle modifications to help manage her blood pressure effectively.  Long-term considerations were discussed, emphasizing that uncontrolled hypertension increases the risk of cardiovascular diseases, including stroke, coronary artery disease, and heart failure.  The patient is advised to seek emergency care if her blood pressure exceeds 180/120 mmHg and is accompanied by symptoms such as headaches, chest pain, palpitations, blurred vision, or dizziness. She verbalized understanding and will follow up as scheduled.

## 2024-01-26 ENCOUNTER — Emergency Department (HOSPITAL_COMMUNITY)
Admission: EM | Admit: 2024-01-26 | Discharge: 2024-01-26 | Disposition: A | Discharge status: Home / Self Care | Attending: Emergency Medicine | Admitting: Emergency Medicine

## 2024-01-26 ENCOUNTER — Emergency Department (HOSPITAL_COMMUNITY)

## 2024-01-26 ENCOUNTER — Encounter (HOSPITAL_COMMUNITY): Payer: Self-pay

## 2024-01-26 ENCOUNTER — Other Ambulatory Visit: Payer: Self-pay

## 2024-01-26 ENCOUNTER — Ambulatory Visit: Payer: Self-pay

## 2024-01-26 ENCOUNTER — Encounter: Payer: Self-pay | Admitting: Family Medicine

## 2024-01-26 DIAGNOSIS — R031 Nonspecific low blood-pressure reading: Secondary | ICD-10-CM | POA: Diagnosis not present

## 2024-01-26 DIAGNOSIS — R0789 Other chest pain: Secondary | ICD-10-CM | POA: Diagnosis not present

## 2024-01-26 DIAGNOSIS — R42 Dizziness and giddiness: Secondary | ICD-10-CM | POA: Diagnosis not present

## 2024-01-26 DIAGNOSIS — T461X5A Adverse effect of calcium-channel blockers, initial encounter: Secondary | ICD-10-CM | POA: Diagnosis not present

## 2024-01-26 DIAGNOSIS — R079 Chest pain, unspecified: Secondary | ICD-10-CM | POA: Diagnosis not present

## 2024-01-26 DIAGNOSIS — T50905A Adverse effect of unspecified drugs, medicaments and biological substances, initial encounter: Secondary | ICD-10-CM

## 2024-01-26 LAB — BASIC METABOLIC PANEL WITH GFR
Anion gap: 11 (ref 5–15)
BUN: 9 mg/dL (ref 6–20)
CO2: 24 mmol/L (ref 22–32)
Calcium: 9.4 mg/dL (ref 8.9–10.3)
Chloride: 102 mmol/L (ref 98–111)
Creatinine, Ser: 1.31 mg/dL — ABNORMAL HIGH (ref 0.61–1.24)
GFR, Estimated: >60 mL/min (ref >60)
Glucose, Bld: 104 mg/dL — ABNORMAL HIGH (ref 70–99)
Potassium: 4 mmol/L (ref 3.5–5.1)
Sodium: 137 mmol/L (ref 135–145)

## 2024-01-26 LAB — CBC
HCT: 51.4 % (ref 39.0–52.0)
Hemoglobin: 17.2 g/dL — ABNORMAL HIGH (ref 13.0–17.0)
MCH: 30.4 pg (ref 26.0–34.0)
MCHC: 33.5 g/dL (ref 30.0–36.0)
MCV: 90.8 fL (ref 80.0–100.0)
Platelets: 321 10*3/uL (ref 150–400)
RBC: 5.66 MIL/uL (ref 4.22–5.81)
RDW: 13.3 % (ref 11.5–15.5)
WBC: 10.9 10*3/uL — ABNORMAL HIGH (ref 4.0–10.5)
nRBC: 0 % (ref 0.0–0.2)

## 2024-01-26 LAB — TROPONIN I (HIGH SENSITIVITY)
Troponin I (High Sensitivity): 4 ng/L (ref <18)
Troponin I (High Sensitivity): 4 ng/L (ref <18)

## 2024-01-26 NOTE — Discharge Instructions (Signed)
 1.  It appears very likely that you had a sudden drop in blood pressure due to your new medication, verapamil .  At this time, start amlodipine each morning and discontinue the verapamil . 2.  Take the amlodipine at the same time each morning and then measure and write down your blood pressure 2-3 times at the same time in a calm situation over the next couple of days.  3.  Schedule your follow-up with your doctor within the next few days to review your response to this medication and any need for increased dosing or other adjustments. 4.  Return to the emergency department if you have new, worsening or concerning symptoms

## 2024-01-26 NOTE — ED Triage Notes (Signed)
 Pt came in via POV d/t HA, dizziness & feeling lightheaded when his BP machine at home said 69/51, then after the machine read "error 5" his BP's was 116/96 @ 1321, then 130/90 @ 1323 & then 135/84 @ 1410. Pt reports 3 days ago was Dx with HTN (per pt). Also endorses he is now having Rt sided neck pain that radiates down to his chest & his Rt hand feels numb. Pt endorses he was checked for "RCVS" back in September" (per pt). A/Ox4, 8/10 pain on the Rt side of his neck during triage. Denies difficulty breathing.

## 2024-01-26 NOTE — Telephone Encounter (Signed)
 fyi

## 2024-01-26 NOTE — ED Provider Notes (Signed)
 [facility] EMERGENCY DEPARTMENT AT [facility] Provider Note   CSN: [id number] Arrival date & time: 01/26/24  1547     History  Chief Complaint  Patient presents with   Headache   Neck Pain    Chest Pain    Jay Montgomery is a 38 y.o. male.  HPI Patient reports that he was started on verapamil  3 days ago.  He has been taking it 3 times a day as prescribed.  He reports that his blood pressure has fluctuated from being elevated to a sudden drop today.  Patient had taken his blood pressure and it read 69/51.  He reports at that time he felt like he was dizzy and lightheaded and had some tightness on the right side of his neck that had tingling to the right hand.  He then got an error reading on the machine and then subsequent repeat blood pressures were 116/96, 130/90 and then 135/84.  He called his provider and was instructed to come to the emergency department for evaluation.  He reports that he did take verapamil  after his hospitalization last November.  He reports once he finished the prescription he did not get a refill.  He has been off of it for about 5 months.  He reports he is felt ok and has not had problems with  bad headaches but he has not been monitoring his blood pressure until recently.  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  At that time no stroke territories were identified and patient did not have any dissections or aneurysms.  Recommendations were for strict blood pressure control.    Home Medications Prior to Admission medications   Medication Sig Start Date End Date Taking? Authorizing Provider  amphetamine -dextroamphetamine  (ADDERALL) 30 MG tablet Take one tablet at 7am and then take one tablet at noon. 01/17/24   Mozingo, Regina Nattalie, NP  ARIPiprazole  (ABILIFY ) 10 MG tablet Take 1 tablet (10 mg total) by mouth 2 (two) times daily.  01/17/24   Mozingo, Regina Nattalie, NP  buPROPion  (WELLBUTRIN  XL) 150 MG 24 hr tablet Take two tablets every morning. 01/17/24   Mozingo, Regina Nattalie, NP  busPIRone  (BUSPAR ) 10 MG tablet Take 1 tablet (10 mg total) by mouth 3 (three) times daily. 01/17/24   Mozingo, Regina Nattalie, NP  clonazePAM  (KLONOPIN ) 1 MG tablet Take one tablet four times daily as needed for anxiety and panic attacks. 01/17/24   Mozingo, Regina Nattalie, NP  gabapentin  (NEURONTIN ) 300 MG capsule Take 1 capsule (300 mg total) by mouth at bedtime. 01/23/24   Zarwolo, Gloria, FNP  lithium  carbonate 150 MG capsule Take 1 capsule (150 mg total) by mouth at bedtime. 01/17/24   Mozingo, Regina Nattalie, NP  verapamil  (CALAN ) 40 MG tablet Take 1 tablet (40 mg total) by mouth 3 (three) times daily. 01/23/24   Zarwolo, Gloria, FNP      Allergies    Hydroxyzine  and Hydrocodone -acetaminophen     Review of Systems   Review of Systems  Physical Exam Updated Vital Signs BP (!) 139/96 (BP Location: Right Arm)   Pulse 79   Temp 98.1 F (36.7 C) (Oral)   Resp 16   Ht 6\' 1"  (1.854 m)   Wt 105.7 kg   SpO2 98%   BMI 30.74 kg/m  Physical Exam Constitutional:      Comments: Well-nourished well-developed.  Patient is alert and well in appearance.  Mental status clear.  No lethargy no confusion.  HENT:     Head: Normocephalic and atraumatic.     Mouth/Throat:     Pharynx: Oropharynx is clear.  Eyes:     Extraocular Movements: Extraocular movements intact.     Conjunctiva/sclera: Conjunctivae normal.     Pupils: Pupils are equal, round, and reactive to light.  Cardiovascular:     Rate and Rhythm: Normal rate and regular rhythm.  Pulmonary:     Effort: Pulmonary effort is normal.     Breath sounds: Normal breath sounds.  Abdominal:     General: There is no distension.     Palpations: Abdomen is soft.     Tenderness: There is no abdominal tenderness.  Musculoskeletal:        General: No swelling or tenderness. Normal range of  motion.     Cervical back: Neck supple.     Right lower leg: No edema.     Left lower leg: No edema.  Skin:    General: Skin is warm and dry.  Neurological:     General: No focal deficit present.     Mental Status: He is oriented to person, place, and time.     Cranial Nerves: No cranial nerve deficit.     Motor: No weakness.     Coordination: Coordination normal.     Comments: Speech clear with normal content.  Finger-nose exam intact bilaterally.  Strength 5/5 x 4 extremities.  Psychiatric:        Mood and Affect: Mood normal.     ED Results / Procedures / Treatments   Labs (all labs ordered are listed, but only abnormal results are displayed) Labs Reviewed  BASIC METABOLIC PANEL WITH GFR - Abnormal; Notable for the following components:      Result Value   Glucose, Bld 104 (*)    Creatinine, Ser 1.31 (*)    All other components within normal limits  CBC - Abnormal; Notable for the following components:   WBC 10.9 (*)    Hemoglobin 17.2 (*)    All other components within normal limits  TROPONIN I (HIGH SENSITIVITY)  TROPONIN I (HIGH SENSITIVITY)    EKG EKG Interpretation Date/Time:  Thursday Jan 26 2024 17:29:33 EDT Ventricular Rate:  98 PR Interval:  172 QRS Duration:  86 QT Interval:  328 QTC Calculation: 418 R Axis:   87  Text Interpretation: Normal sinus rhythm Biatrial enlargement Abnormal ECG When compared with ECG of 10-Aug-2023 08:25, PREVIOUS ECG IS PRESENT rate increased but no sig change from previous Confirmed by Wynetta Heckle [phone]) on 01/26/2024 10:33:56 PM  Radiology DG Chest 2 View Result Date: 01/26/2024 CLINICAL DATA:  Chest pain. EXAM: CHEST - 2 VIEW COMPARISON:  September 09, 2016. FINDINGS: The heart size and mediastinal contours are within normal limits. Both lungs are clear. The visualized skeletal structures are unremarkable. IMPRESSION: No active cardiopulmonary disease. Electronically Signed   By: Rosalene Colon M.D.   On: 01/26/2024 17:30     Procedures Procedures    Medications Ordered in ED Medications - No data to display  ED Course/ Medical Decision Making/ A&P                                 Medical Decision Making Amount and/or Complexity of Data Reviewed Labs: ordered. Radiology: ordered.   Patient presents as outlined.  He does have diagnosis of RCVS by review of neurology note.  There  was recommendation for good blood pressure control.  Patient was just recently started back on verapamil .  He describes episode of labile blood pressure.  At time of evaluation he is well in appearance with no active neurologic symptoms.  Blood pressures in the emergency department has remained borderline hypertensive.  Most recent blood pressure 139/96.  Patient reports he has a prescription waiting at the pharmacy for amlodipine.  At this time with blood pressures being moderately elevated but no signs of hypertensive urgency or emergency, I have recommended the patient start amlodipine and discontinue the verapamil  to see if he gets consistent blood pressure control with once a day dosing and potentially less side effect.  Patient has been off medications for 4 to 5 months.  I recommend close follow-up and home monitoring blood pressures.  Return precautions reviewed.        Final Clinical Impression(s) / ED Diagnoses Final diagnoses:  Drop in blood pressure  Medication reaction, initial encounter    Rx / DC Orders ED Discharge Orders     None         Wynetta Heckle, MD 01/26/24 2237

## 2024-01-26 NOTE — Telephone Encounter (Signed)
 Pt states that he had checked his BP this afternoon with a home monitor. Pt states that the reading was low, followed by numerous error messages. Pt states then his 116/96.  130/90, 135/84.   Chief Complaint: HA Symptoms: HA, worse with light Frequency: today within last hour Pertinent Negatives: Patient denies vision changes,  Disposition: [x] ED /[] Urgent Care (no appt availability in office) / [] Appointment(In office/virtual)/ []  [facility] [facility]/ [] Home Care/ [] Refused Recommended Disposition /[] [facility]/ []  Follow-up with PCP Additional Notes: Pt states that he took his BP today d/t the sudden HA he developed. Pt states that he has been hospitalized for potential RCVS. Pt states that he has extensive medical hx including to the back. Pt states that the HA is bilateral temples as well as forehead. Pt states that he also feels like he "has a swimming head", pt states that it is significant enough that he would not drive at this point. Pt advised ED, agreeable.   Reason for Disposition  [1] SEVERE headache AND [2] sudden-onset (i.e., reaching maximum intensity within seconds to 1 hour)  Answer Assessment - Initial Assessment Questions 1. LOCATION: "Where does it hurt?"      Bilateral temples and forehead 2. ONSET: "When did the headache start?" (Minutes, hours or days)      Started this afternoon 3. PATTERN: "Does the pain come and go, or has it been constant since it started?"     constant 4. SEVERITY: "How bad is the pain?" and "What does it keep you from doing?"  (e.g., Scale 1-10; mild, moderate, or severe)   - MILD (1-3): doesn't interfere with normal activities    - MODERATE (4-7): interferes with normal activities or awakens from sleep    - SEVERE (8-10): excruciating pain, unable to do any normal activities        6 5. RECURRENT SYMPTOM: "Have you ever had headaches before?" If Yes, ask: "When was the last time?" and "What happened that time?"      November was  told that maybe had RCVS per the doctors at that time.  6. CAUSE: "What do you think is causing the headache?"     unsure 7. MIGRAINE: "Have you been diagnosed with migraine headaches?" If Yes, ask: "Is this headache similar?"      denies 8. HEAD INJURY: "Has there been any recent injury to the head?"      denies 9. OTHER SYMPTOMS: "Do you have any other symptoms?" (fever, stiff neck, eye pain, sore throat, cold symptoms)     Eyes are sensitive to light  Protocols used: Headache-A-AH

## 2024-02-09 ENCOUNTER — Ambulatory Visit: Admitting: Family Medicine

## 2024-02-09 ENCOUNTER — Encounter: Payer: Self-pay | Admitting: Family Medicine

## 2024-02-09 VITALS — BP 140/104 | HR 114 | Resp 16 | Ht 73.0 in | Wt 238.0 lb

## 2024-02-09 DIAGNOSIS — I10 Essential (primary) hypertension: Secondary | ICD-10-CM

## 2024-02-09 MED ORDER — NIFEDIPINE ER OSMOTIC RELEASE 60 MG PO TB24: 60.0000 mg | ORAL_TABLET | Freq: Every day | ORAL | 0 refills | Status: DC

## 2024-02-09 MED ORDER — BENAZEPRIL-HYDROCHLOROTHIAZIDE 10-12.5 MG PO TABS: 1.0000 | ORAL_TABLET | Freq: Every day | ORAL | 1 refills | Status: DC

## 2024-02-09 NOTE — Assessment & Plan Note (Signed)
 The patient reports previously experiencing low blood pressure while taking Verapamil  40 mg three times daily, which was subsequently discontinued. He was evaluated in the emergency department (ED) at that time. He currently reports intermittent right-sided chest pain that radiates to the right arm and elbow. He states the discomfort typically lasts 15-20 seconds and subsides with pressure to the affected area. These features are more consistent with musculoskeletal origin. The patient is asymptomatic today. I reviewed the EKG and chest X-ray obtained in the ED, both of which were reassuring with no acute abnormalities noted.  Plan: Refer to the Hypertension Clinic for collaborative management and long-term blood pressure control Initiate Hydrochlorothiazide-Amlodipine  12.5-10 mg daily Encourage a low-sodium diet and increased physical activity Monitor for recurrence of symptoms or new signs such as prolonged chest pain, shortness of breath, or palpitations, and advise prompt evaluation if they occur

## 2024-02-09 NOTE — Patient Instructions (Addendum)
 I appreciate the opportunity to provide care to you today!    Follow up:  1 months  Labs: please stop by the lab today to get your blood drawn (BMP)  Hypertension Management  Your current blood pressure is above the target goal of <140/90 mmHg. To address this, please start taking hydrochlorothiazide-amlodipine  10-12.5 mg daily  Medication Instructions: Take your blood pressure medication at the same time each day. After taking your medication, check your blood pressure at least an hour later. If your first reading is >140/90 mmHg, wait at least 10 minutes and recheck your blood pressure. Side Effects: In the initial days of therapy, you may experience dizziness or lightheadedness as your body adjusts to the lower blood pressure; this is expected. Diet and Lifestyle: Adhere to a low-sodium diet, limiting intake to less than 1500 mg daily, and increase your physical activity. Avoid over-the-counter NSAIDs such as ibuprofen  and naproxen  while on this medication. Hydration and Nutrition: Stay well-hydrated by drinking at least 64 ounces of water daily. Increase your servings of fruits and vegetables and avoid excessive sodium in your diet. Long-Term Considerations: Uncontrolled hypertension can increase the risk of cardiovascular diseases, including stroke, coronary artery disease, and heart failure.  Please report to the emergency department if your blood pressure exceeds 180/120 and is accompanied by symptoms such as headaches, chest pain, palpitations, blurred vision, or dizziness.    Referrals today-  [facility]  Attached with your AVS, you will find valuable resources for self-education. I highly recommend dedicating some time to thoroughly examine them.   Please continue to a heart-healthy diet and increase your physical activities. Try to exercise for at least five days a week.    It was a pleasure to see you and I look forward to continuing to work together on  your health and well-being. Please do not hesitate to call the office if you need care or have questions about your care.  In case of emergency, please visit the Emergency Department for urgent care, or contact our clinic at [phone] to schedule an appointment. We're here to help you!   Have a wonderful day and week. With Gratitude, Ladye Macnaughton MSN, FNP-BC

## 2024-02-09 NOTE — Progress Notes (Signed)
 Established Patient Office Visit  Subjective:  Patient ID: Jay Montgomery, male    DOB: Nov 19, 1985  Age: 38 y.o. MRN: [id number]  CC:  Chief Complaint  Patient presents with   Hypertension    Follow up visit. Said the ER stopped the verapamil  and told him to take amlodipine  5 daily. States Has been hurting in the right side of his chest down right arm to his elbow     HPI Jay Montgomery is a 38 y.o. male with past medical history of Hypertension presents for BP f/u of chronic medical conditions.  For the details of today's visit, please refer to the assessment and plan.    Past Medical History:  Diagnosis Date   Allergy    Anxiety    Chicken pox    Depression    Headache    due to CSF leak   Heart murmur    "as a child"   Spinal stenosis     Past Surgical History:  Procedure Laterality Date   ADENOIDECTOMY     BACK SURGERY  08/17/2017   L4-L5   LUMBAR LAMINECTOMY/DECOMPRESSION MICRODISCECTOMY N/A 12/01/2017   Procedure: REPAIR OF CEREBROSPINAL FLUID LEAK and Placement of Lumbar Drain;  Surgeon: Agustina Aldrich, MD;  Location: [facility] OR;  Service: Neurosurgery;  Laterality: N/A;   SPINE SURGERY  07-2017   2 back surgeries   TONSILLECTOMY      Family History  Problem Relation Age of Onset   Arthritis Mother    Hypertension Mother    COPD Mother    Emphysema Mother    Bipolar disorder Mother    Arthritis Father    Diabetes Father    Bipolar disorder Sister    Schizophrenia Sister    ADD / ADHD Sister    Anxiety disorder Sister     Social History   Socioeconomic History   Marital status: Legally Separated    Spouse name: [name]   Number of children: Not on file   Years of education: Not on file   Highest education level: Some college, no degree  Occupational History   Occupation: Designer, jewellery  Tobacco Use   Smoking status: Former    Current packs/day: 1.00    Average packs/day: 1 pack/day for 20.0 years (20.0 ttl pk-yrs)    Types: Cigarettes    Smokeless tobacco: Never  Vaping Use   Vaping status: Some Days   Substances: Nicotine, CBD, Flavoring  Substance and Sexual Activity   Alcohol use: Not Currently    Comment: Haven't had alcohol in a year due to medication   Drug use: No    Comment: vapes w/ delta 8   Sexual activity: Yes    Partners: Female    Birth control/protection: Other-see comments    Comment: Essure  Other Topics Concern   Not on file  Social History Narrative   Pt lives with wife    Pt not working    Social Drivers of Corporate investment banker Strain: High Risk (01/22/2024)   Overall Financial Resource Strain (CARDIA)    Difficulty of Paying Living Expenses: Very hard  Food Insecurity: Food Insecurity Present (01/22/2024)   Hunger Vital Sign    Worried About Running Out of Food in the Last Year: Often true    Ran Out of Food in the Last Year: Often true  Transportation Needs: No Transportation Needs (01/22/2024)   PRAPARE - Administrator, Civil Service (Medical): No    Lack  of Transportation (Non-Medical): No  Physical Activity: Unknown (01/22/2024)   Exercise Vital Sign    Days of Exercise per Week: 0 days    Minutes of Exercise per Session: Not on file  Stress: Stress Concern Present (01/22/2024)   [employer] of Occupational Health - Occupational Stress Questionnaire    Feeling of Stress : Very much  Social Connections: Socially Isolated (01/22/2024)   Social Connection and Isolation Panel [NHANES]    Frequency of Communication with Friends and Family: More than three times a week    Frequency of Social Gatherings with Friends and Family: Never    Attends Religious Services: Never    Database administrator or Organizations: No    Attends Banker Meetings: Never    Marital Status: Separated  Intimate Partner Violence: Not At Risk (08/10/2023)   Humiliation, Afraid, Rape, and Kick questionnaire    Fear of Current or Ex-Partner: No    Emotionally Abused: No     Physically Abused: No    Sexually Abused: No    Outpatient Medications Prior to Visit  Medication Sig Dispense Refill   amLODipine  (NORVASC ) 5 MG tablet Take 5 mg by mouth daily.     amphetamine -dextroamphetamine  (ADDERALL) 30 MG tablet Take one tablet at 7am and then take one tablet at noon. 60 tablet 0   ARIPiprazole  (ABILIFY ) 10 MG tablet Take 1 tablet (10 mg total) by mouth 2 (two) times daily. 60 tablet 2   buPROPion  (WELLBUTRIN  XL) 150 MG 24 hr tablet Take two tablets every morning. 60 tablet 2   busPIRone  (BUSPAR ) 10 MG tablet Take 1 tablet (10 mg total) by mouth 3 (three) times daily. 90 tablet 2   clonazePAM  (KLONOPIN ) 1 MG tablet Take one tablet four times daily as needed for anxiety and panic attacks. 120 tablet 2   gabapentin  (NEURONTIN ) 300 MG capsule Take 1 capsule (300 mg total) by mouth at bedtime. 90 capsule 3   lithium  carbonate 150 MG capsule Take 1 capsule (150 mg total) by mouth at bedtime. 30 capsule 2   verapamil  (CALAN ) 40 MG tablet Take 1 tablet (40 mg total) by mouth 3 (three) times daily. (Patient not taking: Reported on 02/09/2024) 30 tablet 1   No facility-administered medications prior to visit.    Allergies  Allergen Reactions   Hydroxyzine  Other (See Comments)    Dizziness, mental fogginess, nausea   Hydrocodone -Acetaminophen  Hives, Itching and Rash    ROS Review of Systems  Constitutional:  Negative for fatigue and fever.  Eyes:  Negative for visual disturbance.  Respiratory:  Negative for chest tightness and shortness of breath.   Cardiovascular:  Negative for chest pain and palpitations.  Neurological:  Negative for dizziness and headaches.      Objective:     Physical Exam HENT:     Head: Normocephalic.     Right Ear: External ear normal.     Left Ear: External ear normal.     Nose: No congestion or rhinorrhea.     Mouth/Throat:     Mouth: Mucous membranes are moist.  Cardiovascular:     Rate and Rhythm: Regular rhythm.     Heart  sounds: No murmur heard. Pulmonary:     Effort: No respiratory distress.     Breath sounds: Normal breath sounds.  Neurological:     Mental Status: He is alert.     BP (!) 140/104   Pulse (!) 114   Resp 16   Ht 6\' 1"  (1.854  m)   Wt 238 lb (108 kg)   SpO2 98%   BMI 31.40 kg/m  Wt Readings from Last 3 Encounters:  02/09/24 238 lb (108 kg)  01/26/24 233 lb (105.7 kg)  01/23/24 233 lb 1.3 oz (105.7 kg)    Lab Results  Component Value Date   TSH 2.14 02/15/2019   Lab Results  Component Value Date   WBC 10.9 (H) 01/26/2024   HGB 17.2 (H) 01/26/2024   HCT 51.4 01/26/2024   MCV 90.8 01/26/2024   PLT 321 01/26/2024   Lab Results  Component Value Date   NA 137 01/26/2024   K 4.0 01/26/2024   CO2 24 01/26/2024   GLUCOSE 104 (H) 01/26/2024   BUN 9 01/26/2024   CREATININE 1.31 (H) 01/26/2024   BILITOT 0.6 11/23/2021   ALKPHOS 74 11/23/2021   AST 25 11/23/2021   ALT 24 11/23/2021   PROT 7.2 11/23/2021   ALBUMIN 4.0 11/23/2021   CALCIUM  9.4 01/26/2024   ANIONGAP 11 01/26/2024   GFR 87.77 02/15/2019   Lab Results  Component Value Date   CHOL 311 (H) 08/11/2023   Lab Results  Component Value Date   HDL 44 08/11/2023   Lab Results  Component Value Date   LDLCALC 232 (H) 08/11/2023   Lab Results  Component Value Date   TRIG 176 (H) 08/11/2023   Lab Results  Component Value Date   CHOLHDL 7.1 08/11/2023   Lab Results  Component Value Date   HGBA1C 5.5 08/11/2023      Assessment & Plan:  Primary hypertension Assessment & Plan: The patient reports previously experiencing low blood pressure while taking Verapamil  40 mg three times daily, which was subsequently discontinued. He was evaluated in the emergency department (ED) at that time. He currently reports intermittent right-sided chest pain that radiates to the right arm and elbow. He states the discomfort typically lasts 15-20 seconds and subsides with pressure to the affected area. These features are  more consistent with musculoskeletal origin. The patient is asymptomatic today. I reviewed the EKG and chest X-ray obtained in the ED, both of which were reassuring with no acute abnormalities noted.  Plan: Refer to the Hypertension Clinic for collaborative management and long-term blood pressure control Initiate Hydrochlorothiazide-Amlodipine  12.5-10 mg daily Encourage a low-sodium diet and increased physical activity Monitor for recurrence of symptoms or new signs such as prolonged chest pain, shortness of breath, or palpitations, and advise prompt evaluation if they occur   Orders: -     Benazepril-hydroCHLOROthiazide; Take 1 tablet by mouth daily.  Dispense: 30 tablet; Refill: 1 -     BMP8+EGFR -     Ambulatory referral to [facility]   Note: This chart has been completed using Engineer, civil (consulting) software, and while attempts have been made to ensure accuracy, certain words and phrases may not be transcribed as intended.   Follow-up: Return in about 1 month (around 03/11/2024).   Leotta Weingarten, FNP

## 2024-02-10 ENCOUNTER — Telehealth: Payer: Self-pay | Admitting: Pharmacy Technician

## 2024-02-10 ENCOUNTER — Other Ambulatory Visit (HOSPITAL_COMMUNITY): Payer: Self-pay

## 2024-02-10 ENCOUNTER — Ambulatory Visit: Payer: Self-pay | Admitting: Family Medicine

## 2024-02-10 LAB — BMP8+EGFR
BUN/Creatinine Ratio: 8 — ABNORMAL LOW (ref 9–20)
BUN: 9 mg/dL (ref 6–20)
CO2: 22 mmol/L (ref 20–29)
Calcium: 9.8 mg/dL (ref 8.7–10.2)
Chloride: 100 mmol/L (ref 96–106)
Creatinine, Ser: 1.07 mg/dL (ref 0.76–1.27)
Glucose: 92 mg/dL (ref 70–99)
Potassium: 4.6 mmol/L (ref 3.5–5.2)
Sodium: 138 mmol/L (ref 134–144)
eGFR: 92 mL/min/{1.73_m2} (ref >59)

## 2024-02-10 NOTE — Telephone Encounter (Signed)
 Pharmacy Patient Advocate Encounter   Received notification from CoverMyMeds that prior authorization for Benazepril-hydroCHLOROthiazide 10-12.5MG  tablets is required/requested.   Insurance verification completed.   The patient is insured through [facility] .   Per test claim:  SEE BELOW is preferred by the insurance.  If suggested medication is appropriate, Please send in a new RX and discontinue this one. If not, please advise as to why it's not appropriate so that we may request a Prior Authorization. Please note, some preferred medications may still require a PA.  If the suggested medications have not been trialed and there are no contraindications to their use, the PA will not be submitted, as it will not be approved.  Patient has to try/fail both.

## 2024-02-14 ENCOUNTER — Other Ambulatory Visit: Payer: Self-pay

## 2024-02-14 ENCOUNTER — Telehealth: Admitting: Adult Health

## 2024-02-14 ENCOUNTER — Encounter: Payer: Self-pay | Admitting: Adult Health

## 2024-02-14 DIAGNOSIS — F411 Generalized anxiety disorder: Secondary | ICD-10-CM | POA: Diagnosis not present

## 2024-02-14 DIAGNOSIS — G4700 Insomnia, unspecified: Secondary | ICD-10-CM

## 2024-02-14 DIAGNOSIS — F909 Attention-deficit hyperactivity disorder, unspecified type: Secondary | ICD-10-CM

## 2024-02-14 DIAGNOSIS — F319 Bipolar disorder, unspecified: Secondary | ICD-10-CM | POA: Diagnosis not present

## 2024-02-14 DIAGNOSIS — I10 Essential (primary) hypertension: Secondary | ICD-10-CM

## 2024-02-14 MED ORDER — LISINOPRIL-HYDROCHLOROTHIAZIDE 10-12.5 MG PO TABS: 1.0000 | ORAL_TABLET | Freq: Every day | ORAL | 3 refills | Status: DC

## 2024-02-14 MED ORDER — AMPHETAMINE-DEXTROAMPHETAMINE 30 MG PO TABS: ORAL_TABLET | ORAL | 0 refills | Status: DC

## 2024-02-14 NOTE — Progress Notes (Signed)
 Jay Montgomery [id number] 1985-11-19 38 y.o.  Virtual Visit via Video Note  I connected with pt @ on 02/14/24 at 10:00 AM EDT by a video enabled telemedicine application and verified that I am speaking with the correct person using two identifiers.   I discussed the limitations of evaluation and management by telemedicine and the availability of in person appointments. The patient expressed understanding and agreed to proceed.  I discussed the assessment and treatment plan with the patient. The patient was provided an opportunity to ask questions and all were answered. The patient agreed with the plan and demonstrated an understanding of the instructions.   The patient was advised to call back or seek an in-person evaluation if the symptoms worsen or if the condition fails to improve as anticipated.  I provided 25 minutes of non-face-to-face time during this encounter.  The patient was located at home.  The provider was located at [facility].   Reagan Camera, NP   Subjective:   Patient ID:  Jay Montgomery is a 38 y.o. (DOB [birth year]) male.  Chief Complaint: No chief complaint on file.   HPI Jay Montgomery presents for follow-up of GAD, insomnia, ADHD, and BPD 1.  Describes mood today as "about the same". Pleasant. Flat. Denies tearfulness. Mood symptoms - reports depression - feels like financial stressors and physical health are contributing to a lower mood. Reports lower interest and motivation. Reports increased anxiety at times. Reports issues leaving the house - can only be gone for short periods of time. Has a service dog he's working with and finds comfort when he is with him. Reports increased irritability - "little things seem to bother me". Reports recent panic attacks - 2 to 3 since last visit - "they come randomly". Reports worry, rumination and over thinking - "I'm really scared I'm going to lose my car". Reports periods of mania - "over spending  this month". Reports mood as variable - "ups and downs". Stating "I have major ups and downs - my mood fluctuates". Reports physical limitations - pain and his health contributes to his declining mental health. Reports he is awaiting an appointment with pain management. Taking medications as prescribed. Energy levels lower. Active, does not have a regular exercise routine with physical disabilities. Enjoys some usual interests and activities. Married. Lives with wife and family. Appetite adequate. Weight stable - 235 pounds. Reports sleep is variable. Averages 5 to 6 hours of broken sleep. Reports daytime napping.   Focus and concentration stable - with Adderall. Completing tasks - self care. Unable to manage household - has help. Reports he is unable to work with current physical limitations and medical issues. Denies SI or HI. Denies AH or VH. Denies self harm. Denies substance use.  Review of Systems:  Review of Systems  Musculoskeletal:  Negative for gait problem.  Neurological:  Negative for tremors.  Psychiatric/Behavioral:         Please refer to HPI    Medications: I have reviewed the patient's current medications.  Current Outpatient Medications  Medication Sig Dispense Refill   amLODipine  (NORVASC ) 5 MG tablet Take 5 mg by mouth daily.     amphetamine -dextroamphetamine  (ADDERALL) 30 MG tablet Take one tablet at 7am and then take one tablet at noon. 60 tablet 0   ARIPiprazole  (ABILIFY ) 10 MG tablet Take 1 tablet (10 mg total) by mouth 2 (two) times daily. 60 tablet 2   benazepril-hydrochlorthiazide (LOTENSIN HCT) 10-12.5 MG tablet Take 1 tablet by mouth  daily. 30 tablet 1   buPROPion  (WELLBUTRIN  XL) 150 MG 24 hr tablet Take two tablets every morning. 60 tablet 2   busPIRone  (BUSPAR ) 10 MG tablet Take 1 tablet (10 mg total) by mouth 3 (three) times daily. 90 tablet 2   clonazePAM  (KLONOPIN ) 1 MG tablet Take one tablet four times daily as needed for anxiety and panic attacks. 120  tablet 2   gabapentin  (NEURONTIN ) 300 MG capsule Take 1 capsule (300 mg total) by mouth at bedtime. 90 capsule 3   lithium  carbonate 150 MG capsule Take 1 capsule (150 mg total) by mouth at bedtime. 30 capsule 2   No current facility-administered medications for this visit.    Medication Side Effects: None  Allergies:  Allergies  Allergen Reactions   Hydroxyzine  Other (See Comments)    Dizziness, mental fogginess, nausea   Hydrocodone -Acetaminophen  Hives, Itching and Rash    Past Medical History:  Diagnosis Date   Allergy    Anxiety    Chicken pox    Depression    Headache    due to CSF leak   Heart murmur    "as a child"   Spinal stenosis     Family History  Problem Relation Age of Onset   Arthritis Mother    Hypertension Mother    COPD Mother    Emphysema Mother    Bipolar disorder Mother    Arthritis Father    Diabetes Father    Bipolar disorder Sister    Schizophrenia Sister    ADD / ADHD Sister    Anxiety disorder Sister     Social History   Socioeconomic History   Marital status: Legally Separated    Spouse name: [name]   Number of children: Not on file   Years of education: Not on file   Highest education level: Some college, no degree  Occupational History   Occupation: Designer, jewellery  Tobacco Use   Smoking status: Former    Current packs/day: 1.00    Average packs/day: 1 pack/day for 20.0 years (20.0 ttl pk-yrs)    Types: Cigarettes   Smokeless tobacco: Never  Vaping Use   Vaping status: Some Days   Substances: Nicotine, CBD, Flavoring  Substance and Sexual Activity   Alcohol use: Not Currently    Comment: Haven't had alcohol in a year due to medication   Drug use: No    Comment: vapes w/ delta 8   Sexual activity: Yes    Partners: Female    Birth control/protection: Other-see comments    Comment: Essure  Other Topics Concern   Not on file  Social History Narrative   Pt lives with wife    Pt not working    Social Drivers of  Corporate investment banker Strain: High Risk (01/22/2024)   Overall Financial Resource Strain (CARDIA)    Difficulty of Paying Living Expenses: Very hard  Food Insecurity: Food Insecurity Present (01/22/2024)   Hunger Vital Sign    Worried About Running Out of Food in the Last Year: Often true    Ran Out of Food in the Last Year: Often true  Transportation Needs: No Transportation Needs (01/22/2024)   PRAPARE - Administrator, Civil Service (Medical): No    Lack of Transportation (Non-Medical): No  Physical Activity: Unknown (01/22/2024)   Exercise Vital Sign    Days of Exercise per Week: 0 days    Minutes of Exercise per Session: Not on file  Stress: Stress Concern  Present (01/22/2024)   [employer] of Occupational Health - Occupational Stress Questionnaire    Feeling of Stress : Very much  Social Connections: Socially Isolated (01/22/2024)   Social Connection and Isolation Panel [NHANES]    Frequency of Communication with Friends and Family: More than three times a week    Frequency of Social Gatherings with Friends and Family: Never    Attends Religious Services: Never    Database administrator or Organizations: No    Attends Banker Meetings: Never    Marital Status: Separated  Intimate Partner Violence: Not At Risk (08/10/2023)   Humiliation, Afraid, Rape, and Kick questionnaire    Fear of Current or Ex-Partner: No    Emotionally Abused: No    Physically Abused: No    Sexually Abused: No    Past Medical History, Surgical history, Social history, and Family history were reviewed and updated as appropriate.   Please see review of systems for further details on the patient's review from today.   Objective:   Physical Exam:  There were no vitals taken for this visit.  Physical Exam Constitutional:      General: He is not in acute distress. Musculoskeletal:        General: No deformity.  Neurological:     Mental Status: He is alert and oriented to  person, place, and time.     Coordination: Coordination normal.  Psychiatric:        Attention and Perception: Attention and perception normal. He does not perceive auditory or visual hallucinations.        Mood and Affect: Mood is anxious and depressed. Affect is flat. Affect is not labile, blunt, angry or inappropriate.        Speech: Speech normal.        Behavior: Behavior normal.        Thought Content: Thought content normal. Thought content is not paranoid or delusional. Thought content does not include homicidal or suicidal ideation. Thought content does not include homicidal or suicidal plan.        Cognition and Memory: Cognition and memory normal.        Judgment: Judgment normal.     Comments: Insight intact     Lab Review:     Component Value Date/Time   NA 138 02/09/2024 1413   K 4.6 02/09/2024 1413   CL 100 02/09/2024 1413   CO2 22 02/09/2024 1413   GLUCOSE 92 02/09/2024 1413   GLUCOSE 104 (H) 01/26/2024 1703   BUN 9 02/09/2024 1413   CREATININE 1.07 02/09/2024 1413   CALCIUM  9.8 02/09/2024 1413   PROT 7.2 11/23/2021 0740   ALBUMIN 4.0 11/23/2021 0740   AST 25 11/23/2021 0740   ALT 24 11/23/2021 0740   ALKPHOS 74 11/23/2021 0740   BILITOT 0.6 11/23/2021 0740   GFRNONAA >60 01/26/2024 1703   GFRAA >60 06/23/2020 0620       Component Value Date/Time   WBC 10.9 (H) 01/26/2024 1703   RBC 5.66 01/26/2024 1703   HGB 17.2 (H) 01/26/2024 1703   HCT 51.4 01/26/2024 1703   PLT 321 01/26/2024 1703   MCV 90.8 01/26/2024 1703   MCH 30.4 01/26/2024 1703   MCHC 33.5 01/26/2024 1703   RDW 13.3 01/26/2024 1703   LYMPHSABS 2.0 08/10/2023 0844   MONOABS 0.7 08/10/2023 0844   EOSABS 0.4 08/10/2023 0844   BASOSABS 0.0 08/10/2023 0844    No results found for: "POCLITH", "LITHIUM "   No results found  for: "PHENYTOIN", "PHENOBARB", "VALPROATE", "CBMZ"   .res Assessment: Plan:    Plan:  Reports BP 125/84 - has been checking regularly.  Next neurology appointment -  06/05/2024  Abilify  20mg  daily to target mood instability.  Lithium  150mg  at bedtime - to target passive SI.  Wellbutrin  XL 300mg  daily - denies seizure history. Buspar  10mg  TID  Klonopin  1mg  4 times daily - taking 3 a day. Adderall 30mg  BID   ADHD outside testing - tested 47 - adult ADHD. Consider referral for psychological testing.  RTC 3/4 weeks  30 minutes spent dedicated to the care of this patient on the date of this encounter to include pre-visit review of records, ordering of medication, post visit documentation, and face-to-face time with the patient discussing GAD, insomnia, ADHD, and BPD 1. Discussed continuing current medication regimen.  Patient remains totally disabled and unable to work currently 02/14/2024 through 03/18/2024.  Discussed potential metabolic side effects associated with atypical antipsychotics, as well as potential risk for movement side effects. Advised pt to contact office if movement side effects occur.   Discussed potential benefits, risk, and side effects of benzodiazepines to include potential risk of tolerance and dependence, as well as possible drowsiness.  Advised patient not to drive if experiencing drowsiness and to take lowest possible effective dose to minimize risk of dependence and tolerance.   Discussed potential benefits, risks, and side effects of stimulants with patient to include increased heart rate, palpitations, insomnia, increased anxiety, increased irritability, or decreased appetite.  Instructed patient to contact office if experiencing any significant tolerability issues.   There are no diagnoses linked to this encounter.   Please see After Visit Summary for patient specific instructions.  Future Appointments  Date Time Provider Department Center  02/14/2024 10:00 AM Myrene Bougher, Ursula Gardner, NP [facility] None  04/05/2024 10:20 AM Zarwolo, Gloria, FNP [facility]  04/09/2024  4:00 PM Lisabeth Rider, MD [facility] None    No orders of  the defined types were placed in this encounter.     -------------------------------

## 2024-02-15 NOTE — Telephone Encounter (Signed)
 Drug therapy changed to lisinopril/hydrochlorothiazide 10-12.5mg .

## 2024-02-19 ENCOUNTER — Other Ambulatory Visit: Payer: Self-pay

## 2024-02-19 DIAGNOSIS — I10 Essential (primary) hypertension: Secondary | ICD-10-CM

## 2024-02-19 MED ORDER — AMLODIPINE BESYLATE 10 MG PO TABS: 10.0000 mg | ORAL_TABLET | Freq: Every day | ORAL | 3 refills | Status: DC

## 2024-02-21 ENCOUNTER — Telehealth: Payer: Self-pay | Admitting: Family Medicine

## 2024-02-21 DIAGNOSIS — Z6831 Body mass index (BMI) 31.0-31.9, adult: Secondary | ICD-10-CM | POA: Diagnosis not present

## 2024-02-21 DIAGNOSIS — E6609 Other obesity due to excess calories: Secondary | ICD-10-CM | POA: Diagnosis not present

## 2024-02-21 DIAGNOSIS — M48062 Spinal stenosis, lumbar region with neurogenic claudication: Secondary | ICD-10-CM | POA: Diagnosis not present

## 2024-02-21 DIAGNOSIS — M129 Arthropathy, unspecified: Secondary | ICD-10-CM | POA: Diagnosis not present

## 2024-02-21 DIAGNOSIS — R030 Elevated blood-pressure reading, without diagnosis of hypertension: Secondary | ICD-10-CM | POA: Diagnosis not present

## 2024-02-21 DIAGNOSIS — F411 Generalized anxiety disorder: Secondary | ICD-10-CM | POA: Diagnosis not present

## 2024-02-21 DIAGNOSIS — M549 Dorsalgia, unspecified: Secondary | ICD-10-CM | POA: Diagnosis not present

## 2024-02-21 DIAGNOSIS — Z79899 Other long term (current) drug therapy: Secondary | ICD-10-CM | POA: Diagnosis not present

## 2024-02-21 NOTE — Telephone Encounter (Addendum)
 Please advise. Patient requesting new referral to pain management that accepts medicaid. Current referral is unable to schedule patient.    Copied from CRM [id number]. Topic: Referral - Question >> Feb 21, 2024  9:17 AM Emylou G wrote:  Reason for CRM: Patient called. His back is in pain. said referral he was sent to - he has a balance there because the post MRI wasn't approved by his OLD insurance ( he was NOT made aware of the the insurance hiss at the time ).. they won't take him even if covered by medicaid because of his past due bal?  Can he be seen by somewhere else that uses medicaid?

## 2024-02-23 NOTE — Telephone Encounter (Signed)
 Referral redirected to [facility] in [place] at this time.

## 2024-03-01 DIAGNOSIS — Z79899 Other long term (current) drug therapy: Secondary | ICD-10-CM | POA: Diagnosis not present

## 2024-03-07 ENCOUNTER — Ambulatory Visit: Admitting: Adult Health

## 2024-03-13 ENCOUNTER — Encounter: Payer: Self-pay | Admitting: Adult Health

## 2024-03-13 ENCOUNTER — Telehealth: Admitting: Adult Health

## 2024-03-13 DIAGNOSIS — F909 Attention-deficit hyperactivity disorder, unspecified type: Secondary | ICD-10-CM | POA: Diagnosis not present

## 2024-03-13 DIAGNOSIS — F411 Generalized anxiety disorder: Secondary | ICD-10-CM | POA: Diagnosis not present

## 2024-03-13 DIAGNOSIS — F319 Bipolar disorder, unspecified: Secondary | ICD-10-CM

## 2024-03-13 DIAGNOSIS — G4700 Insomnia, unspecified: Secondary | ICD-10-CM

## 2024-03-13 NOTE — Progress Notes (Signed)
 Jay Montgomery [id number] January 07, 1986 38 y.o.  Virtual Visit via Video Note  I connected with pt @ on 03/13/24 at 11:00 AM EDT by a video enabled telemedicine application and verified that I am speaking with the correct person using two identifiers.   I discussed the limitations of evaluation and management by telemedicine and the availability of in person appointments. The patient expressed understanding and agreed to proceed.  I discussed the assessment and treatment plan with the patient. The patient was provided an opportunity to ask questions and all were answered. The patient agreed with the plan and demonstrated an understanding of the instructions.   The patient was advised to call back or seek an in-person evaluation if the symptoms worsen or if the condition fails to improve as anticipated.  I provided 30 minutes of non-face-to-face time during this encounter.  The patient was located at home.  The provider was located at [facility].   Jay LOISE Sayers, NP   Subjective:   Patient ID:  Jay Montgomery is a 38 y.o. (DOB [birth year]) male.  Chief Complaint: No chief complaint on file.   HPI Javontay Vandam Minix presents for follow-up of GAD, insomnia, ADHD, and BPD 1.  Describes mood today as "up and down". Pleasant. Flat. Denies tearfulness. Mood symptoms - reports depression - reports on going situational stressors - physical health and financial. Reports low interest and motivation. Reports increased anxiety at times - it comes and goes. Reports difficulties leaving the house. Has a service dog he's working with to help him when he has to go out.  Reports increased irritability - over everything. Reports recent panic attacks - 3 to 4 since last visit. Reports worry, rumination and over thinking - increased financial stressors. Reports periods of mania - spending money he doesn't have. Reports mood as variable - having ups and downs. Stating I feel like  I'm better some days, but most days are very hard on me - every day is a struggle.  Reports physical limitations - pain and his physical and mental  contributes to a low mood. Reports he has met with pain management and has started taking Oxycodone  5mg  every 4 to 6 hours.  Taking medications as prescribed. Energy levels lower - some days are better than others. Active, does not have a regular exercise routine with physical disabilities. Enjoys some usual interests and activities. Married. Lives with wife and family. Appetite adequate. Weight stable - 235 pounds. Reports sleep has improved since starting the pain medication. Averages 5 to 6 hours of sleep. Denies daytime napping.   Focus and concentration ok for the most part. Completing minimal tasks - self care. Unable to manage household - reports his niece is living with him to help with things he can't do. Reports he is unable to work with current physical limitations and medical issues. Denies SI or HI. Denies AH or VH. Denies self harm. Denies substance use.   Review of Systems:  Review of Systems  Musculoskeletal:  Negative for gait problem.  Neurological:  Negative for tremors.  Psychiatric/Behavioral:         Please refer to HPI    Medications: I have reviewed the patient's current medications.  Current Outpatient Medications  Medication Sig Dispense Refill   amLODipine  (NORVASC ) 10 MG tablet Take 1 tablet (10 mg total) by mouth daily. 30 tablet 3   amphetamine -dextroamphetamine  (ADDERALL) 30 MG tablet Take one tablet at 7am and then take one tablet at noon. 60 tablet  0   ARIPiprazole  (ABILIFY ) 10 MG tablet Take 1 tablet (10 mg total) by mouth 2 (two) times daily. 60 tablet 2   buPROPion  (WELLBUTRIN  XL) 150 MG 24 hr tablet Take two tablets every morning. 60 tablet 2   busPIRone  (BUSPAR ) 10 MG tablet Take 1 tablet (10 mg total) by mouth 3 (three) times daily. 90 tablet 2   clonazePAM  (KLONOPIN ) 1 MG tablet Take one tablet  four times daily as needed for anxiety and panic attacks. 120 tablet 2   gabapentin  (NEURONTIN ) 300 MG capsule Take 1 capsule (300 mg total) by mouth at bedtime. 90 capsule 3   lithium  carbonate 150 MG capsule Take 1 capsule (150 mg total) by mouth at bedtime. 30 capsule 2   No current facility-administered medications for this visit.    Medication Side Effects: None  Allergies:  Allergies  Allergen Reactions   Hydroxyzine  Other (See Comments)    Dizziness, mental fogginess, nausea   Hydrocodone -Acetaminophen  Hives, Itching and Rash    Past Medical History:  Diagnosis Date   Allergy    Anxiety    Chicken pox    Depression    Headache    due to CSF leak   Heart murmur    as a child   Spinal stenosis     Family History  Problem Relation Age of Onset   Arthritis Mother    Hypertension Mother    COPD Mother    Emphysema Mother    Bipolar disorder Mother    Arthritis Father    Diabetes Father    Bipolar disorder Sister    Schizophrenia Sister    ADD / ADHD Sister    Anxiety disorder Sister     Social History   Socioeconomic History   Marital status: Legally Separated    Spouse name: [name]   Number of children: Not on file   Years of education: Not on file   Highest education level: Some college, no degree  Occupational History   Occupation: Designer, jewellery  Tobacco Use   Smoking status: Former    Current packs/day: 1.00    Average packs/day: 1 pack/day for 20.0 years (20.0 ttl pk-yrs)    Types: Cigarettes   Smokeless tobacco: Never  Vaping Use   Vaping status: Some Days   Substances: Nicotine, CBD, Flavoring  Substance and Sexual Activity   Alcohol use: Not Currently    Comment: Haven't had alcohol in a year due to medication   Drug use: No    Comment: vapes w/ delta 8   Sexual activity: Yes    Partners: Female    Birth control/protection: Other-see comments    Comment: Essure  Other Topics Concern   Not on file  Social History Narrative    Pt lives with wife    Pt not working    Social Drivers of Corporate investment banker Strain: High Risk (01/22/2024)   Overall Financial Resource Strain (CARDIA)    Difficulty of Paying Living Expenses: Very hard  Food Insecurity: Food Insecurity Present (01/22/2024)   Hunger Vital Sign    Worried About Running Out of Food in the Last Year: Often true    Ran Out of Food in the Last Year: Often true  Transportation Needs: No Transportation Needs (01/22/2024)   PRAPARE - Administrator, Civil Service (Medical): No    Lack of Transportation (Non-Medical): No  Physical Activity: Unknown (01/22/2024)   Exercise Vital Sign    Days of Exercise  per Week: 0 days    Minutes of Exercise per Session: Not on file  Stress: Stress Concern Present (01/22/2024)   [employer] of Occupational Health - Occupational Stress Questionnaire    Feeling of Stress : Very much  Social Connections: Socially Isolated (01/22/2024)   Social Connection and Isolation Panel    Frequency of Communication with Friends and Family: More than three times a week    Frequency of Social Gatherings with Friends and Family: Never    Attends Religious Services: Never    Database administrator or Organizations: No    Attends Banker Meetings: Never    Marital Status: Separated  Intimate Partner Violence: Not At Risk (08/10/2023)   Humiliation, Afraid, Rape, and Kick questionnaire    Fear of Current or Ex-Partner: No    Emotionally Abused: No    Physically Abused: No    Sexually Abused: No    Past Medical History, Surgical history, Social history, and Family history were reviewed and updated as appropriate.   Please see review of systems for further details on the patient's review from today.   Objective:   Physical Exam:  There were no vitals taken for this visit.  Physical Exam Constitutional:      General: He is not in acute distress.  Musculoskeletal:        General: No deformity.    Neurological:     Mental Status: He is alert and oriented to person, place, and time.     Coordination: Coordination normal.   Psychiatric:        Attention and Perception: Attention and perception normal. He does not perceive auditory or visual hallucinations.        Mood and Affect: Mood is anxious and depressed. Affect is not labile, blunt, angry or inappropriate.        Speech: Speech normal.        Behavior: Behavior normal.        Thought Content: Thought content normal. Thought content is not paranoid or delusional. Thought content does not include homicidal or suicidal ideation. Thought content does not include homicidal or suicidal plan.        Cognition and Memory: Cognition and memory normal.        Judgment: Judgment normal.     Comments: Insight intact     Lab Review:     Component Value Date/Time   NA 138 02/09/2024 1413   K 4.6 02/09/2024 1413   CL 100 02/09/2024 1413   CO2 22 02/09/2024 1413   GLUCOSE 92 02/09/2024 1413   GLUCOSE 104 (H) 01/26/2024 1703   BUN 9 02/09/2024 1413   CREATININE 1.07 02/09/2024 1413   CALCIUM  9.8 02/09/2024 1413   PROT 7.2 11/23/2021 0740   ALBUMIN 4.0 11/23/2021 0740   AST 25 11/23/2021 0740   ALT 24 11/23/2021 0740   ALKPHOS 74 11/23/2021 0740   BILITOT 0.6 11/23/2021 0740   GFRNONAA >60 01/26/2024 1703   GFRAA >60 06/23/2020 0620       Component Value Date/Time   WBC 10.9 (H) 01/26/2024 1703   RBC 5.66 01/26/2024 1703   HGB 17.2 (H) 01/26/2024 1703   HCT 51.4 01/26/2024 1703   PLT 321 01/26/2024 1703   MCV 90.8 01/26/2024 1703   MCH 30.4 01/26/2024 1703   MCHC 33.5 01/26/2024 1703   RDW 13.3 01/26/2024 1703   LYMPHSABS 2.0 08/10/2023 0844   MONOABS 0.7 08/10/2023 0844   EOSABS 0.4 08/10/2023 0844  BASOSABS 0.0 08/10/2023 0844    No results found for: POCLITH, LITHIUM    No results found for: PHENYTOIN, PHENOBARB, VALPROATE, CBMZ   .res Assessment: Plan:    Plan:  Reports BP 118/84 - has been  checking regularly.  Next neurology appointment - 06/05/2024  Abilify  20mg  daily to target mood instability.  Lithium  150mg  at bedtime - to target passive SI.  Wellbutrin  XL 300mg  daily - denies seizure history. Buspar  10mg  TID  D/C Klonopin  1mg  4 times daily - taking 3 a day - has tapered off  Decrease Adderall 30mg  BID to daily  ADHD outside testing - tested 73 - adult ADHD. Consider referral for psychological testing.  RTC 3/4 weeks  30 minutes spent dedicated to the care of this patient on the date of this encounter to include pre-visit review of records, ordering of medication, post visit documentation, and face-to-face time with the patient discussing GAD, insomnia, ADHD, and BPD 1. Discussed continuing current medication regimen.  Patient remains totally disabled and unable to work currently 03/13/2024 through 05/13/2024.  Discussed potential metabolic side effects associated with atypical antipsychotics, as well as potential risk for movement side effects. Advised pt to contact office if movement side effects occur.   Discussed potential benefits, risk, and side effects of benzodiazepines to include potential risk of tolerance and dependence, as well as possible drowsiness.  Advised patient not to drive if experiencing drowsiness and to take lowest possible effective dose to minimize risk of dependence and tolerance.   Discussed potential benefits, risks, and side effects of stimulants with patient to include increased heart rate, palpitations, insomnia, increased anxiety, increased irritability, or decreased appetite.  Instructed patient to contact office if experiencing any significant tolerability issues.   There are no diagnoses linked to this encounter.   Please see After Visit Summary for patient specific instructions.  Future Appointments  Date Time Provider Department Center  03/13/2024 11:00 AM Demetria Lightsey, Jay Mattocks, NP [facility] None  03/15/2024  8:25 AM Vannie Reche RAMAN, NP [facility]  04/05/2024 10:20 AM Zarwolo, Gloria, FNP [facility]  04/09/2024  4:00 PM Rosemarie Eather RAMAN, MD [facility] None    No orders of the defined types were placed in this encounter.     -------------------------------

## 2024-03-15 ENCOUNTER — Encounter (HOSPITAL_BASED_OUTPATIENT_CLINIC_OR_DEPARTMENT_OTHER): Payer: Self-pay | Admitting: Family

## 2024-03-15 ENCOUNTER — Ambulatory Visit (INDEPENDENT_AMBULATORY_CARE_PROVIDER_SITE_OTHER): Admitting: Family

## 2024-03-15 VITALS — BP 133/91 | HR 83 | Ht 74.0 in | Wt 229.0 lb

## 2024-03-15 DIAGNOSIS — G4730 Sleep apnea, unspecified: Secondary | ICD-10-CM | POA: Diagnosis not present

## 2024-03-15 DIAGNOSIS — R0683 Snoring: Secondary | ICD-10-CM

## 2024-03-15 DIAGNOSIS — E782 Mixed hyperlipidemia: Secondary | ICD-10-CM

## 2024-03-15 DIAGNOSIS — I10 Essential (primary) hypertension: Secondary | ICD-10-CM

## 2024-03-15 MED ORDER — CARVEDILOL 6.25 MG PO TABS: 6.2500 mg | ORAL_TABLET | Freq: Two times a day (BID) | ORAL | 1 refills | Status: DC

## 2024-03-15 NOTE — Patient Instructions (Signed)
 Medication Instructions:  Your physician has recommended you make the following change in your medication:   Stop: nifedipine    Start: Carvedilol 6.25mg  twice daily    Labwork: Labs today    Testing/Procedures: WatchPAT?  Is a FDA cleared portable home sleep study test that uses a watch and 3 points of contact to monitor 7 different channels, including your heart rate, oxygen saturations, body position, snoring, and chest motion.  The study is easy to use from the comfort of your own home and accurately detect sleep apnea.  Before bed, you attach the chest sensor, attached the sleep apnea bracelet to your nondominant hand, and attach the finger probe.  After the study, the raw data is downloaded from the watch and scored for apnea events.   For more information: [email or web address]  Patient Testing Instructions:  Do not put battery into the device until bedtime when you are ready to begin the test. Please call the support number if you need assistance after following the instructions below: 24 hour support line- [phone] or [facility] support at [phone] (option 2)  Download the [facility] WatchPAT One app through the google play store or App Store  Be sure to turn on or enable access to bluetooth in settlings on your smartphone/ device  Make sure no other bluetooth devices are on and within the vicinity of your smartphone/ device and WatchPAT watch during testing.  Make sure to leave your smart phone/ device plugged in and charging all night.  When ready for bed:  Follow the instructions step by step in the WatchPAT One App to activate the testing device. For additional instructions, including video instruction, visit the WatchPAT One video on Youtube. You can search for WatchPat One within Youtube (video is 4 minutes and 18 seconds) or enter: https://youtube/watch?[email or web address]_vbiwxE Please note: You will be prompted to enter a Pin to connect via bluetooth when starting the  test. The PIN will be assigned to you when you receive the test.  The device is disposable, but it recommended that you retain the device until you receive a call letting you know the study has been received and the results have been interpreted.  We will let you know if the study did not transmit to us  properly after the test is completed. You do not need to call us  to confirm the receipt of the test.  Please complete the test within 48 hours of receiving PIN.   Frequently Asked Questions:  What is Watch Bruna one?  A single use fully disposable home sleep apnea testing device and will not need to be returned after completion.  What are the requirements to use WatchPAT one?  The be able to have a successful watchpat one sleep study, you should have your Watch pat one device, your smart phone, watch pat one app, your PIN number and Internet access What type of phone do I need?  You should have a smart phone that uses Android 5.1 and above or any Iphone with IOS 10 and above How can I download the WatchPAT one app?  Based on your device type search for WatchPAT one app either in google play for android devices or APP store for Iphone's Where will I get my PIN for the study?  Your PIN will be provided by your physician's office. It is used for authentication and if you lose/forget your PIN, please reach out to your providers office.  I do not have Internet at home. Can I do WatchPAT one study?  WatchPAT  One needs Internet connection throughout the night to be able to transmit the sleep data. You can use your home/local internet or your cellular's data package. However, it is always recommended to use home/local Internet. It is estimated that between 20MB-30MB will be used with each study.However, the application will be looking for space in the phone to start the study.  What happens if I lose internet or bluetooth connection?  During the internet disconnection, your phone will not be able to  transmit the sleep data. All the data, will be stored in your phone. As soon as the internet connection is back on, the phone will being sending the sleep data. During the bluetooth disconnection, WatchPAT one will not be able to to send the sleep data to your phone. Data will be kept in the WatchPAT one until two devices have bluetooth connection back on. As soon as the connection is back on, WatchPAT one will send the sleep data to the phone.  How long do I need to wear the WatchPAT one?  After you start the study, you should wear the device at least 6 hours.  How far should I keep my phone from the device?  During the night, your phone should be within 15 feet.  What happens if I leave the room for restroom or other reasons?  Leaving the room for any reason will not cause any problem. As soon as your get back to the room, both devices will reconnect and will continue to send the sleep data. Can I use my phone during the sleep study?  Yes, you can use your phone as usual during the study. But it is recommended to put your watchpat one on when you are ready to go to bed.  How will I get my study results?  A soon as you completed your study, your sleep data will be sent to the provider. They will then share the results with you when they are ready.    Follow-Up: Please follow up in 6-8 weeks  in [facility] with Dr. Raford, Reche Finder, NP or Kristin Alvstad PharmD

## 2024-03-15 NOTE — Progress Notes (Signed)
 Advanced Hypertension Clinic Initial Assessment:    Date:  03/15/2024   ID:  OLUWATIMILEHIN BALFOUR, DOB 28-Feb-1986, MRN [id number]  PCP:  Zarwolo, Gloria, FNP  Cardiologist:  Lynwood Schilling, MD  Nephrologist:  Referring MD: Zarwolo, Gloria, FNP   CC: Hypertension  History of Present Illness:    Jay Montgomery is a 38 y.o. male with a hx of hypertension, reversible cerebral vasoconstriction syndrome, GAD, bipolar1 here to establish care in the Advanced Hypertension Clinic.   ED visit 01/26/2024 after sudden drop in blood pressure after starting verapamil  3 days prior with home BP 69/51 with repeat readings at home 116/96, 130/90, 135/84.  He is recommended to stop verapamil  and start amlodipine .  Discussed the use of AI scribe software for clinical note transcription with the patient, who gave verbal consent to proceed.  History of Present Illness Jay Montgomery is a 38 year old male with hypertension who presents for evaluation of uncontrolled blood pressure. He is accompanied by his wife.   Hypertension was diagnosed in November 2024. Home blood pressure readings are 139/92, 131/87, and 133/91, measured on the arm cuff on his left arm. He takes amlodipine  in the morning.Notes he is also still taking NIfedipine . Benazepril  hydrochlorothiazide  was switched to lisinopril  hydrochlorothiazide , but he never started these due to potential interactions with lithium .  He experiences a high heart rate, typically 100 to 122 bpm, with occasional heart racing and fluttering, sometimes visible through his shirt per his report. No chest pain or leg swelling.  He has significant sleep disturbances, difficulty staying asleep, frequent tossing and turning, and snoring with reported apneic episodes. No prior sleep study. Family history includes multiple members with sleep apnea.  Exercis elimited by chronic pain for which he follows with pain managemnet.   Previous  antihypertensives: Lisinopril -hydrochlorothiazide  - stopped due to interaction with Lithium  Benazepril -hydrochlorothiazide  Verapamil   Past Medical History:  Diagnosis Date   Allergy    Anxiety    Chicken pox    Depression    Headache    due to CSF leak   Heart murmur    as a child   Spinal stenosis     Past Surgical History:  Procedure Laterality Date   ADENOIDECTOMY     BACK SURGERY  08/17/2017   L4-L5   LUMBAR LAMINECTOMY/DECOMPRESSION MICRODISCECTOMY N/A 12/01/2017   Procedure: REPAIR OF CEREBROSPINAL FLUID LEAK and Placement of Lumbar Drain;  Surgeon: Louis Shove, MD;  Location: [facility] OR;  Service: Neurosurgery;  Laterality: N/A;   SPINE SURGERY  07-2017   2 back surgeries   TONSILLECTOMY      Current Medications: Current Meds  Medication Sig   amLODipine  (NORVASC ) 10 MG tablet Take 1 tablet (10 mg total) by mouth daily.   amphetamine -dextroamphetamine  (ADDERALL) 30 MG tablet Take one tablet at 7am and then take one tablet at noon.   ARIPiprazole  (ABILIFY ) 10 MG tablet Take 1 tablet (10 mg total) by mouth 2 (two) times daily.   buPROPion  (WELLBUTRIN  XL) 150 MG 24 hr tablet Take two tablets every morning.   busPIRone  (BUSPAR ) 10 MG tablet Take 1 tablet (10 mg total) by mouth 3 (three) times daily.   gabapentin  (NEURONTIN ) 300 MG capsule Take 1 capsule (300 mg total) by mouth at bedtime.   lithium  carbonate 150 MG capsule Take 1 capsule (150 mg total) by mouth at bedtime.   NIFEdipine  (ADALAT  CC) 60 MG 24 hr tablet Take 60 mg by mouth daily.   oxyCODONE  (OXY IR/ROXICODONE ) 5 MG immediate  release tablet Take 5 mg by mouth every 4 (four) hours as needed.     Allergies:   Hydroxyzine  and Hydrocodone -acetaminophen    Social History   Socioeconomic History   Marital status: Legally Separated    Spouse name: [name]   Number of children: Not on file   Years of education: Not on file   Highest education level: Some college, no degree  Occupational History    Occupation: Designer, jewellery  Tobacco Use   Smoking status: Former    Current packs/day: 1.00    Average packs/day: 1 pack/day for 20.0 years (20.0 ttl pk-yrs)    Types: Cigarettes   Smokeless tobacco: Never  Vaping Use   Vaping status: Some Days   Substances: Nicotine, CBD, Flavoring  Substance and Sexual Activity   Alcohol use: Not Currently    Comment: Haven't had alcohol in a year due to medication   Drug use: No    Comment: vapes w/ delta 8   Sexual activity: Yes    Partners: Female    Birth control/protection: Other-see comments    Comment: Essure  Other Topics Concern   Not on file  Social History Narrative   Pt lives with wife    Pt not working    Social Drivers of Corporate investment banker Strain: High Risk (01/22/2024)   Overall Financial Resource Strain (CARDIA)    Difficulty of Paying Living Expenses: Very hard  Food Insecurity: Food Insecurity Present (01/22/2024)   Hunger Vital Sign    Worried About Running Out of Food in the Last Year: Often true    Ran Out of Food in the Last Year: Often true  Transportation Needs: No Transportation Needs (01/22/2024)   PRAPARE - Administrator, Civil Service (Medical): No    Lack of Transportation (Non-Medical): No  Physical Activity: Unknown (01/22/2024)   Exercise Vital Sign    Days of Exercise per Week: 0 days    Minutes of Exercise per Session: Not on file  Stress: Stress Concern Present (01/22/2024)   [employer] of Occupational Health - Occupational Stress Questionnaire    Feeling of Stress : Very much  Social Connections: Socially Isolated (01/22/2024)   Social Connection and Isolation Panel    Frequency of Communication with Friends and Family: More than three times a week    Frequency of Social Gatherings with Friends and Family: Never    Attends Religious Services: Never    Database administrator or Organizations: No    Attends Banker Meetings: Never    Marital Status: Separated      Family History: The patient's family history includes ADD / ADHD in his sister; Anxiety disorder in his sister; Arthritis in his father and mother; Bipolar disorder in his mother and sister; COPD in his mother; Diabetes in his father; Emphysema in his mother; Hypertension in his mother; Schizophrenia in his sister.  ROS:   Please see the history of present illness.    All other systems reviewed and are negative.  EKGs/Labs/Other Studies Reviewed:         Recent Labs: 01/26/2024: Hemoglobin 17.2; Platelets 321 02/09/2024: BUN 9; Creatinine, Ser 1.07; Potassium 4.6; Sodium 138   Recent Lipid Panel    Component Value Date/Time   CHOL 311 (H) 08/11/2023 0504   TRIG 176 (H) 08/11/2023 0504   HDL 44 08/11/2023 0504   CHOLHDL 7.1 08/11/2023 0504   VLDL 35 08/11/2023 0504   LDLCALC 232 (H) 08/11/2023 9495  Physical Exam:   VS:  BP (!) 133/91 (BP Location: Right Arm, Patient Position: Sitting, Cuff Size: Normal)   Pulse 83   Ht 6' 2 (1.88 m)   Wt 229 lb (103.9 kg)   SpO2 99%   BMI 29.40 kg/m  , BMI Body mass index is 29.4 kg/m.  Vitals:   03/15/24 0824 03/15/24 0826  BP: 131/87 (!) 133/91  Pulse: 83   Height: 6' 2 (1.88 m)   Weight: 229 lb (103.9 kg)   SpO2: 99%   BMI (Calculated): 29.39     GENERAL:  Well appearing HEENT: Pupils equal round and reactive, fundi not visualized, oral mucosa unremarkable NECK:  No jugular venous distention, waveform within normal limits, carotid upstroke brisk and symmetric, no bruits, no thyromegaly LYMPHATICS:  No cervical adenopathy LUNGS:  Clear to auscultation bilaterally HEART:  RRR.  PMI not displaced or sustained,S1 and S2 within normal limits, no S3, no S4, no clicks, no rubs, no murmurs ABD:  Flat, positive bowel sounds normal in frequency in pitch, no bruits, no rebound, no guarding, no midline pulsatile mass, no hepatomegaly, no splenomegaly EXT:  2 plus pulses throughout, no edema, no cyanosis no clubbing SKIN:  No rashes  no nodules NEURO:  Cranial nerves II through XII grossly intact, motor grossly intact throughout PSYCH:  Cognitively intact, oriented to person place and time   ASSESSMENT/PLAN:    Assessment & Plan Hypertension BP not at goal <130/80. Noted interactions with lithium  and various antihypertensives. Elevated heart rate noted. - Stop nifedipine  (duplicate therapy) -Continue Amlodipine  10mg  daily.  - Start carvedilol  6.25 mg twice daily. -can consider increased dose carvedilol  at follow up with monitoring of renal function vs addition of Spironolactone -avoid ACE/ARB and thiazide diuretic due to interaction with lithium  - Order blood work to evaluate secondary causes of hypertension (renin-aldosterone, catecholamines, metanephrines, CMET, TSH) - Order home sleep study to evaluate for sleep apnea.  Tachycardia Tachycardia with heart rate 100-122 bpm. Notes high heart rate was present even prior to starting Adderal a few years ago. Beta blocker therapy chosen to address elevated heart rate and hypertension. - Start carvedilol  6.25 mg twice daily. Can further up titrate at follow up.  -reaction with LItuium noted as can increase concentration of beta blocker, continue monitoring HR at home and start with low dose.   Sleep apnea (suspected) / Snores / Daytime somnolence / Sleep disordered breathing Suspected sleep apnea based on symptoms. Potential contributor to hypertension. STOPBang 5.  - Order home sleep study.  Hyperlipidemia Hyperlipidemia with elevated LDL and triglycerides. Potential familial hyperlipidemia discussed as 07/2023 LDL 232. Has not bee non cholesterol lowering medication. - Order fasting lipid panel. - Order lipoprotein A test to evaluate for familial hyperlipidemia.  Chronic pain Chronic pain impacting daily activities and contributing to elevated blood pressure. Managed by pain specialist. - Continue current pain management regime per pain management  clinic    Screening for Secondary Hypertension:     Relevant Labs/Studies:    Latest Ref Rng & Units 02/09/2024    2:13 PM 01/26/2024    5:03 PM 10/03/2023    8:38 AM  Basic Labs  Sodium 134 - 144 mmol/L 138  137    Potassium 3.5 - 5.2 mmol/L 4.6  4.0    Creatinine 0.76 - 1.27 mg/dL 8.92  8.68  8.79        Latest Ref Rng & Units 02/15/2019    9:49 AM  Thyroid    TSH 0.35 - 4.50  uIU/mL 2.14                   Disposition:    FU with MD/APP/PharmD in 2 months    Medication Adjustments/Labs and Tests Ordered: Current medicines are reviewed at length with the patient today.  Concerns regarding medicines are outlined above.  No orders of the defined types were placed in this encounter.  No orders of the defined types were placed in this encounter.    Signed, Reche GORMAN Finder, NP  03/15/2024 9:10 AM    [facility]

## 2024-03-16 ENCOUNTER — Telehealth (HOSPITAL_BASED_OUTPATIENT_CLINIC_OR_DEPARTMENT_OTHER): Payer: Self-pay | Admitting: Family

## 2024-03-16 NOTE — Telephone Encounter (Signed)
 03/16/24 labs reviewed on [facility] DXA as catecholamines, metanephrines, renin-aldosterone will likely not result till late next week.   Labs 03/16/24 show: creatinine 1.06, GFR 98, K 4.7, Alk phos 128, AST 20, ALT 27 total cholesterol 263, triglycerides 163, HDL 39, LDL 193 lp(a) 125 TSH 1.22  Normal thyroid , kidneys. Two of three liver enzymes normal. Alkaline phosphatase mildly elevated - recommend limiting fried foods.   Cholesterol elevated and lipoprotein (a) consistent with familial hyperlipidemia.   Recommend addition of Rosuvastatin  10mg  daily with repeat fasting lipid panel, CMET at August follow up.   MyChart message composed and will deliver to Mr. Cardenas 03/19/24.   Brynlyn Dade S Cyprian Gongaware, NP

## 2024-03-19 ENCOUNTER — Telehealth: Payer: Self-pay

## 2024-03-19 MED ORDER — ROSUVASTATIN CALCIUM 10 MG PO TABS: 10.0000 mg | ORAL_TABLET | Freq: Every day | ORAL | 3 refills | Status: DC

## 2024-03-19 NOTE — Telephone Encounter (Signed)
**Note De-Identified Trason Shifflet Obfuscation** We have no insurance information on file for the pt so I called him to ask for his insurance info or if he is paying for the Itamar-HST out of pocket. I got no answer so I left a message on his VM (Ok per [employer]) asking him to call Macario back at [employer], NPs office at [facility] at [phone].

## 2024-03-19 NOTE — Addendum Note (Signed)
 Addended by: TANDA ERMA HERO on: 03/19/2024 09:07 AM   Modules accepted: Orders

## 2024-03-19 NOTE — Addendum Note (Signed)
 Addended by: Jovaughn Wojtaszek S on: 03/19/2024 05:09 PM   Modules accepted: Orders

## 2024-03-20 ENCOUNTER — Encounter (INDEPENDENT_AMBULATORY_CARE_PROVIDER_SITE_OTHER): Payer: Self-pay | Admitting: Cardiology

## 2024-03-20 ENCOUNTER — Telehealth: Payer: Self-pay | Admitting: Licensed Clinical Social Worker

## 2024-03-20 DIAGNOSIS — G4733 Obstructive sleep apnea (adult) (pediatric): Secondary | ICD-10-CM

## 2024-03-20 NOTE — Telephone Encounter (Signed)
**Note De-Identified Lennyn Gange Obfuscation** FYI: Numbers off the back of the pts BCBS [facility] card is: [phone] Member #  or  [phone] Provider #   Ordering provider: Reche Finder, Np Associated diagnoses: Snoring-R06.83 and Sleep-disordered breathing-G47.33  WatchPAT PA obtained on 03/20/2024 by Lynne Righi, Avelina HERO, LPN. Authorization: Per the [employer] Medicaid website: [facility] does not require prior authorization for CPT Code: 04199 (Itamar-HST).  Patient notified of PIN (1234) on 03/20/2024 Tesha Archambeau Notification Method: phone-no answer so I left a message on the pts VM (Ok per [facility]) advising him of the [facility] One-HST Device Pin # of [phone] and that he can proceed with his home sleep study as [employer] [facility] does not require a PA for this type of sleep test. I did leave my name and the office number in the message so he can call us  back if he has any questions or concerns..  Phone note routed to covering staff for follow-up.

## 2024-03-20 NOTE — Telephone Encounter (Signed)
 [employer]BS Healthy [place] Member ID # [id number] ID# [id number]

## 2024-03-20 NOTE — Telephone Encounter (Signed)
 H&V Care Navigation CSW Progress Note  Clinical Social Worker contacted patient by phone to f/u on SDOH needs per NP. No answer at [phone]. Left voicemail. Will re-attempt again as able for any needed resources.  Patient is participating in a Managed Medicaid Plan:  Yes- healthy blue  SDOH Screenings   Food Insecurity: Food Insecurity Present (01/22/2024)  Housing: High Risk (01/22/2024)  Transportation Needs: No Transportation Needs (01/22/2024)  Utilities: Not At Risk (08/10/2023)  Alcohol Screen: Low Risk  (01/22/2024)  Depression (PHQ2-9): High Risk (02/14/2024)  Financial Resource Strain: High Risk (01/22/2024)  Physical Activity: Unknown (01/22/2024)  Social Connections: Socially Isolated (01/22/2024)  Stress: Stress Concern Present (01/22/2024)  Tobacco Use: Medium Risk (03/15/2024)      Marit Lark, MSW, LCSW Clinical Social Worker II [facility] Health Heart/Vascular Care Navigation  [phone]- work cell phone (preferred)

## 2024-03-22 ENCOUNTER — Ambulatory Visit: Admitting: Family Medicine

## 2024-03-22 ENCOUNTER — Telehealth: Payer: Self-pay | Admitting: Licensed Clinical Social Worker

## 2024-03-22 NOTE — Telephone Encounter (Signed)
 H&V Care Navigation CSW Progress Note  Clinical Social Worker contacted patient by phone to f/u again on SDOH needs per NP. No answer at [phone]. Left voicemail.  Pt returned my call but it appears number did not ring through. LCSW attempted pt again today and left second voicemail. Remain available as needed, will attempt one more time as able.    Patient is participating in a Managed Medicaid Plan:  Yes- Healthy Blue  SDOH Screenings   Food Insecurity: Food Insecurity Present (01/22/2024)  Housing: High Risk (01/22/2024)  Transportation Needs: No Transportation Needs (01/22/2024)  Utilities: Not At Risk (08/10/2023)  Alcohol Screen: Low Risk  (01/22/2024)  Depression (PHQ2-9): High Risk (02/14/2024)  Financial Resource Strain: High Risk (01/22/2024)  Physical Activity: Unknown (01/22/2024)  Social Connections: Socially Isolated (01/22/2024)  Stress: Stress Concern Present (01/22/2024)  Tobacco Use: Medium Risk (03/15/2024)    Marit Lark, MSW, LCSW Clinical Social Worker II [facility] Heart/Vascular Care Navigation  [phone]- work cell phone (preferred)

## 2024-03-23 LAB — CATECHOLAMINES, FRACTIONATED, PLASMA
Dopamine: 18.1 pg/mL (ref 0.0–36.7)
Epinephrine: 43.2 pg/mL (ref 0.0–55.4)
Norepinephrine: 834 pg/mL — ABNORMAL HIGH (ref 115–524)

## 2024-03-23 LAB — COMPREHENSIVE METABOLIC PANEL WITH GFR
ALT: 27 IU/L (ref 0–44)
AST: 20 IU/L (ref 0–40)
Albumin: 5.1 g/dL (ref 4.1–5.1)
Alkaline Phosphatase: 128 IU/L — ABNORMAL HIGH (ref 44–121)
BUN/Creatinine Ratio: 15 (ref 9–20)
BUN: 16 mg/dL (ref 6–20)
Bilirubin Total: 0.3 mg/dL (ref 0.0–1.2)
CO2: 19 mmol/L — ABNORMAL LOW (ref 20–29)
Calcium: 10 mg/dL (ref 8.7–10.2)
Chloride: 101 mmol/L (ref 96–106)
Creatinine, Ser: 1.06 mg/dL (ref 0.76–1.27)
Globulin, Total: 2.4 g/dL (ref 1.5–4.5)
Glucose: 102 mg/dL — ABNORMAL HIGH (ref 70–99)
Potassium: 4.7 mmol/L (ref 3.5–5.2)
Sodium: 137 mmol/L (ref 134–144)
Total Protein: 7.5 g/dL (ref 6.0–8.5)
eGFR: 93 mL/min/1.73 (ref >59)

## 2024-03-23 LAB — LIPID PANEL
Chol/HDL Ratio: 6.7 ratio — ABNORMAL HIGH (ref 0.0–5.0)
Cholesterol, Total: 263 mg/dL — ABNORMAL HIGH (ref 100–199)
HDL: 39 mg/dL — ABNORMAL LOW (ref >39)
LDL Chol Calc (NIH): 193 mg/dL — ABNORMAL HIGH (ref 0–99)
Triglycerides: 163 mg/dL — ABNORMAL HIGH (ref 0–149)
VLDL Cholesterol Cal: 31 mg/dL (ref 5–40)

## 2024-03-23 LAB — LIPOPROTEIN A (LPA): Lipoprotein (a): 125 nmol/L — ABNORMAL HIGH (ref <75.0)

## 2024-03-23 LAB — METANEPHRINES, PLASMA
Metanephrine, Free: <25 pg/mL (ref 0.0–88.0)
Normetanephrine, Free: 123.4 pg/mL (ref 0.0–210.1)

## 2024-03-23 LAB — ALDOSTERONE + RENIN ACTIVITY W/ RATIO
Aldos/Renin Ratio: 6.5 (ref 0.0–30.0)
Aldosterone: 36.1 ng/dL — ABNORMAL HIGH (ref 0.0–30.0)
Renin Activity, Plasma: 5.532 ng/mL/h — ABNORMAL HIGH (ref 0.167–5.380)

## 2024-03-23 LAB — TSH: TSH: 1.22 u[IU]/mL (ref 0.450–4.500)

## 2024-03-26 ENCOUNTER — Ambulatory Visit (HOSPITAL_BASED_OUTPATIENT_CLINIC_OR_DEPARTMENT_OTHER): Payer: Self-pay | Admitting: Family

## 2024-03-26 DIAGNOSIS — I10 Essential (primary) hypertension: Secondary | ICD-10-CM

## 2024-03-27 ENCOUNTER — Telehealth (HOSPITAL_BASED_OUTPATIENT_CLINIC_OR_DEPARTMENT_OTHER): Payer: Self-pay | Admitting: Licensed Clinical Social Worker

## 2024-03-27 NOTE — Progress Notes (Signed)
 Heart and Vascular Care Navigation  03/27/2024  Jay Montgomery 1986-02-01 [id number]  Reason for Referral: SDOH concerns noted Patient is participating in a Managed Medicaid Plan:Yes  Engaged with patient by telephone for initial visit for Heart and Vascular Care Coordination.                                                                                                   Assessment:        Pt returned my call today from [phone]. Introduced self, role, reason for call. Confirmed home address and PCP. Pt resides with his niece and his significant other. Currently no challenges with his Medicaid, or transportation to and from appts/other areas as needed. He shares that he applied for disability in June or July of last year. He has since been denied 3 times and has an appeal coming up, he is supported by [employer] for this claim. His partner Lorenza works full time. Her income unfortunately is too much to qualify the household for SNAP benefits. They make ends meet with her income and food pantries. He is aware of several options in his area but interested in any additional resources. Currently not behind on rent/utilities (the ones that matter). He shares that everything going on including his back pain and his bipolar disorder make his stress levels high. He is appreciative of the assistance his niece gives around the house doing chores and cleaning up when he cannot. He sees a [employer] provider monthly for support and med management. He is aware of crisis resources as needed. No additional questions at this time but encouraged him to f/u with me as needed once he has received the information I will send.                             HRT/VAS Care Coordination     Patients Home Cardiology Office [facility]  [facility]   Outpatient Care Team Social Worker   Social Worker Name: Marit Lark, [place], [phone]   Living arrangements for the past 2 months Single Family Home    Lives with: Significant Other; Relatives   Patient Current Insurance Coverage Medicaid   Patient Has Concern With Paying Medical Bills No   Does Patient Have Prescription Coverage? Yes   Home Assistive Devices/Equipment None       Social History:                                                                             SDOH Screenings   Food Insecurity: Food Insecurity Present (03/27/2024)  Housing: Low Risk  (03/27/2024)  Recent Concern: Housing - High Risk (01/22/2024)  Transportation Needs: No Transportation Needs (03/27/2024)  Utilities: Not At Risk (03/27/2024)  Alcohol Screen: Low Risk  (  03/25/2024)  Depression (PHQ2-9): High Risk (02/14/2024)  Financial Resource Strain: Low Risk  (03/25/2024)  Recent Concern: Financial Resource Strain - High Risk (01/22/2024)  Physical Activity: Inactive (03/25/2024)  Social Connections: Moderately Isolated (03/25/2024)  Stress: Stress Concern Present (03/27/2024)  Tobacco Use: Medium Risk (03/15/2024)  Health Literacy: Adequate Health Literacy (03/27/2024)    SDOH Interventions: Financial Resources:   Financial resources Interventions: DSS for financial assistance and Tourist information centre manager [employer] resource guide  Food Insecurity:  Food Insecurity Interventions: Programmer, applications Provided (Tourist information centre manager Co Resource guide)  Housing Insecurity:  Housing Interventions: Intervention Not Indicated  Transportation:   Transportation Interventions: Intervention Not Indicated     Other Care Navigation Interventions:     Provided Pharmacy assistance resources  Pt denies any issues obtaining medications at this time  Patient expressed Mental Health concerns Yes, Referred to:  n/a- pt actively sees [facility] provider- feels well supported, supportive family and aware of crisis resources   Follow-up plan:   LCSW has mailed pt a copy of my card and Games developer for [employer] for additional food support and other community support for utilities  as needed.

## 2024-03-27 NOTE — Telephone Encounter (Signed)
 H&V Care Navigation CSW Progress Note  Clinical Social Worker contacted patient by phone to f/u on community resources as pt SDOH Screening measures were red. Left 3rd voicemail which was returned by pt.  Patient is participating in a Managed Medicaid Plan:  Yes- healthy blue  SDOH Screenings   Food Insecurity: Food Insecurity Present (03/25/2024)  Housing: Low Risk  (03/25/2024)  Recent Concern: Housing - High Risk (01/22/2024)  Transportation Needs: No Transportation Needs (03/25/2024)  Utilities: Not At Risk (08/10/2023)  Alcohol Screen: Low Risk  (03/25/2024)  Depression (PHQ2-9): High Risk (02/14/2024)  Financial Resource Strain: Low Risk  (03/25/2024)  Recent Concern: Financial Resource Strain - High Risk (01/22/2024)  Physical Activity: Inactive (03/25/2024)  Social Connections: Moderately Isolated (03/25/2024)  Stress: Stress Concern Present (03/25/2024)  Tobacco Use: Medium Risk (03/15/2024)    Marit Lark, MSW, LCSW Clinical Social Worker II [facility] Health Heart/Vascular Care Navigation  [phone]- work cell phone (preferred)

## 2024-03-28 DIAGNOSIS — I10 Essential (primary) hypertension: Secondary | ICD-10-CM | POA: Diagnosis not present

## 2024-03-29 ENCOUNTER — Encounter: Payer: Self-pay | Admitting: Family Medicine

## 2024-03-29 ENCOUNTER — Ambulatory Visit: Admitting: Family Medicine

## 2024-03-29 ENCOUNTER — Other Ambulatory Visit: Payer: Self-pay

## 2024-03-29 ENCOUNTER — Telehealth: Payer: Self-pay | Admitting: Adult Health

## 2024-03-29 ENCOUNTER — Telehealth: Payer: Self-pay

## 2024-03-29 VITALS — BP 159/109 | HR 99 | Ht 74.0 in | Wt 238.0 lb

## 2024-03-29 DIAGNOSIS — E6609 Other obesity due to excess calories: Secondary | ICD-10-CM | POA: Diagnosis not present

## 2024-03-29 DIAGNOSIS — R2681 Unsteadiness on feet: Secondary | ICD-10-CM | POA: Diagnosis not present

## 2024-03-29 DIAGNOSIS — E559 Vitamin D deficiency, unspecified: Secondary | ICD-10-CM | POA: Diagnosis not present

## 2024-03-29 DIAGNOSIS — F17210 Nicotine dependence, cigarettes, uncomplicated: Secondary | ICD-10-CM | POA: Diagnosis not present

## 2024-03-29 DIAGNOSIS — M48062 Spinal stenosis, lumbar region with neurogenic claudication: Secondary | ICD-10-CM | POA: Diagnosis not present

## 2024-03-29 DIAGNOSIS — Z6831 Body mass index (BMI) 31.0-31.9, adult: Secondary | ICD-10-CM | POA: Diagnosis not present

## 2024-03-29 DIAGNOSIS — E7849 Other hyperlipidemia: Secondary | ICD-10-CM | POA: Diagnosis not present

## 2024-03-29 DIAGNOSIS — I10 Essential (primary) hypertension: Secondary | ICD-10-CM

## 2024-03-29 DIAGNOSIS — E038 Other specified hypothyroidism: Secondary | ICD-10-CM | POA: Diagnosis not present

## 2024-03-29 DIAGNOSIS — M549 Dorsalgia, unspecified: Secondary | ICD-10-CM | POA: Diagnosis not present

## 2024-03-29 DIAGNOSIS — R7301 Impaired fasting glucose: Secondary | ICD-10-CM | POA: Diagnosis not present

## 2024-03-29 DIAGNOSIS — Z79899 Other long term (current) drug therapy: Secondary | ICD-10-CM | POA: Diagnosis not present

## 2024-03-29 DIAGNOSIS — F909 Attention-deficit hyperactivity disorder, unspecified type: Secondary | ICD-10-CM

## 2024-03-29 MED ORDER — NIFEDIPINE ER OSMOTIC RELEASE 60 MG PO TB24: 60.0000 mg | ORAL_TABLET | Freq: Every day | ORAL | 1 refills | Status: DC

## 2024-03-29 NOTE — Assessment & Plan Note (Signed)
 The patient reports engaging in a significant amount of walking but experiences unsteadiness, leg fatigue, and a sensation that his legs "give out," contributing to imbalance. He is requesting a walking cane to assist with stability and prevent falls.

## 2024-03-29 NOTE — Telephone Encounter (Signed)
 Copied from CRM [id number]. Topic: General - Other >> Mar 29, 2024  1:01 PM Santiya F wrote: Reason for CRM: Patient is calling in because he was seen in office and his provider was supposed to send a request for a cane and he wants to know who it was sent to. He said he reached out to the pharmacy but they didn't have anything from his provider. Please follow up with patient.

## 2024-03-29 NOTE — Assessment & Plan Note (Signed)
 The patient's blood pressure is uncontrolled in the clinic today. Amlodipine  10 mg will be discontinued, and therapy will be initiated with Procardia  (nifedipine ) 60 mg daily. The patient is encouraged to continue taking carvedilol  6.25 mg twice daily and to follow up with cardiology as scheduled. A low-sodium diet and increased physical activity are also encouraged.

## 2024-03-29 NOTE — Telephone Encounter (Signed)
 Pt called at 11:37a requesting refill of Adderall to   [facility] [address], [place] - [address], [place] [address] Phone: [phone]  Fax: [phone]   Next appt 7/28

## 2024-03-29 NOTE — Telephone Encounter (Signed)
 Pended.

## 2024-03-29 NOTE — Progress Notes (Signed)
 Established Patient Office Visit  Subjective:  Patient ID: Jay Montgomery, male    DOB: [birth year]  Age: 38 y.o. MRN: [id number]  CC:  Chief Complaint  Patient presents with   Follow-up    1 month follow up HTN, pt has his reading  with him, he stated that it been running at the top 140's-150's , pt hasn't missed any doses of medication.     HPI Jay Montgomery is a 38 y.o. male with past medical history of  HTN presents for f/u of  BP follow up. For the details of today's visit, please refer to the assessment and plan.   Past Medical History:  Diagnosis Date   Allergy    Anxiety    Chicken pox    Depression    Headache    due to CSF leak   Heart murmur    as a child   Spinal stenosis     Past Surgical History:  Procedure Laterality Date   ADENOIDECTOMY     BACK SURGERY  08/17/2017   L4-L5   LUMBAR LAMINECTOMY/DECOMPRESSION MICRODISCECTOMY N/A 12/01/2017   Procedure: REPAIR OF CEREBROSPINAL FLUID LEAK and Placement of Lumbar Drain;  Surgeon: Louis Shove, MD;  Location: [facility] OR;  Service: Neurosurgery;  Laterality: N/A;   SPINE SURGERY  07-2017   2 back surgeries   TONSILLECTOMY      Family History  Problem Relation Age of Onset   Arthritis Mother    Hypertension Mother    COPD Mother    Emphysema Mother    Bipolar disorder Mother    Hypertension Father    Arthritis Father    Diabetes Father    Neuropathy Father    Bipolar disorder Sister    Schizophrenia Sister    ADD / ADHD Sister    Anxiety disorder Sister    Atrial fibrillation Sister     Social History   Socioeconomic History   Marital status: Legally Separated    Spouse name: [name]   Number of children: Not on file   Years of education: Not on file   Highest education level: 12th grade  Occupational History   Occupation: Designer, jewellery  Tobacco Use   Smoking status: Former    Current packs/day: 1.00    Average packs/day: 1 pack/day for 20.0 years (20.0 ttl pk-yrs)    Types:  Cigarettes   Smokeless tobacco: Never  Vaping Use   Vaping status: Some Days   Substances: Nicotine, CBD, Flavoring  Substance and Sexual Activity   Alcohol use: Not Currently    Comment: Haven't had alcohol in a year due to medication   Drug use: No    Comment: vapes w/ delta 8   Sexual activity: Yes    Partners: Female    Birth control/protection: Other-see comments    Comment: Essure  Other Topics Concern   Not on file  Social History Narrative   Pt lives with wife    Pt not working    Social Drivers of Corporate investment banker Strain: Low Risk  (03/25/2024)   Overall Financial Resource Strain (CARDIA)    Difficulty of Paying Living Expenses: Not very hard  Recent Concern: Financial Resource Strain - High Risk (01/22/2024)   Overall Financial Resource Strain (CARDIA)    Difficulty of Paying Living Expenses: Very hard  Food Insecurity: Food Insecurity Present (03/27/2024)   Hunger Vital Sign    Worried About Running Out of Food in the Last Year: Sometimes  true    Ran Out of Food in the Last Year: Sometimes true  Transportation Needs: No Transportation Needs (03/27/2024)   PRAPARE - Administrator, Civil Service (Medical): No    Lack of Transportation (Non-Medical): No  Physical Activity: Inactive (03/25/2024)   Exercise Vital Sign    Days of Exercise per Week: 0 days    Minutes of Exercise per Session: Not on file  Stress: Stress Concern Present (03/27/2024)   [employer] of Occupational Health - Occupational Stress Questionnaire    Feeling of Stress: Very much  Social Connections: Moderately Isolated (03/25/2024)   Social Connection and Isolation Panel    Frequency of Communication with Friends and Family: More than three times a week    Frequency of Social Gatherings with Friends and Family: Once a week    Attends Religious Services: Never    Database administrator or Organizations: No    Attends Engineer, structural: Not on file    Marital Status:  Living with partner  Intimate Partner Violence: Not At Risk (08/10/2023)   Humiliation, Afraid, Rape, and Kick questionnaire    Fear of Current or Ex-Partner: No    Emotionally Abused: No    Physically Abused: No    Sexually Abused: No    Outpatient Medications Prior to Visit  Medication Sig Dispense Refill   amphetamine -dextroamphetamine  (ADDERALL) 30 MG tablet Take one tablet at 7am and then take one tablet at noon. 60 tablet 0   ARIPiprazole  (ABILIFY ) 10 MG tablet Take 1 tablet (10 mg total) by mouth 2 (two) times daily. 60 tablet 2   buPROPion  (WELLBUTRIN  XL) 150 MG 24 hr tablet Take two tablets every morning. 60 tablet 2   busPIRone  (BUSPAR ) 10 MG tablet Take 1 tablet (10 mg total) by mouth 3 (three) times daily. 90 tablet 2   carvedilol  (COREG ) 6.25 MG tablet Take 1 tablet (6.25 mg total) by mouth 2 (two) times daily. 180 tablet 1   gabapentin  (NEURONTIN ) 300 MG capsule Take 1 capsule (300 mg total) by mouth at bedtime. 90 capsule 3   lithium  carbonate 150 MG capsule Take 1 capsule (150 mg total) by mouth at bedtime. 30 capsule 2   oxyCODONE  (OXY IR/ROXICODONE ) 5 MG immediate release tablet Take 5 mg by mouth every 4 (four) hours as needed.     rosuvastatin  (CRESTOR ) 10 MG tablet Take 1 tablet (10 mg total) by mouth daily. 90 tablet 3   amLODipine  (NORVASC ) 10 MG tablet Take 1 tablet (10 mg total) by mouth daily. 30 tablet 3   No facility-administered medications prior to visit.    Allergies  Allergen Reactions   Hydroxyzine  Other (See Comments)    Dizziness, mental fogginess, nausea   Hydrocodone -Acetaminophen  Hives, Itching and Rash    ROS Review of Systems  Constitutional:  Negative for fatigue and fever.  Eyes:  Negative for visual disturbance.  Respiratory:  Negative for chest tightness and shortness of breath.   Cardiovascular:  Negative for chest pain and palpitations.  Musculoskeletal:        Unsteady gait  Neurological:  Negative for dizziness and headaches.       Objective:    Physical Exam HENT:     Head: Normocephalic.     Right Ear: External ear normal.     Left Ear: External ear normal.     Nose: No congestion.     Mouth/Throat:     Mouth: Mucous membranes are moist.  Cardiovascular:  Rate and Rhythm: Regular rhythm. Bradycardia present.     Heart sounds: No murmur heard. Pulmonary:     Effort: Respiratory distress present.     Breath sounds: Normal breath sounds.  Neurological:     Mental Status: He is alert and oriented to person, place, and time.     Coordination: Finger-Nose-Finger Test normal.     Gait: Gait abnormal.     BP (!) 159/109 (BP Location: Left Arm)   Pulse 99   Ht 6' 2 (1.88 m)   Wt 238 lb (108 kg)   SpO2 95%   BMI 30.56 kg/m  Wt Readings from Last 3 Encounters:  03/29/24 238 lb (108 kg)  03/15/24 229 lb (103.9 kg)  02/09/24 238 lb (108 kg)    Lab Results  Component Value Date   TSH 1.220 03/15/2024   Lab Results  Component Value Date   WBC 10.9 (H) 01/26/2024   HGB 17.2 (H) 01/26/2024   HCT 51.4 01/26/2024   MCV 90.8 01/26/2024   PLT 321 01/26/2024   Lab Results  Component Value Date   NA 137 03/15/2024   K 4.7 03/15/2024   CO2 19 (L) 03/15/2024   GLUCOSE 102 (H) 03/15/2024   BUN 16 03/15/2024   CREATININE 1.06 03/15/2024   BILITOT 0.3 03/15/2024   ALKPHOS 128 (H) 03/15/2024   AST 20 03/15/2024   ALT 27 03/15/2024   PROT 7.5 03/15/2024   ALBUMIN 5.1 03/15/2024   CALCIUM  10.0 03/15/2024   ANIONGAP 11 01/26/2024   EGFR 93 03/15/2024   GFR 87.77 02/15/2019   Lab Results  Component Value Date   CHOL 263 (H) 03/15/2024   Lab Results  Component Value Date   HDL 39 (L) 03/15/2024   Lab Results  Component Value Date   LDLCALC 193 (H) 03/15/2024   Lab Results  Component Value Date   TRIG 163 (H) 03/15/2024   Lab Results  Component Value Date   CHOLHDL 6.7 (H) 03/15/2024   Lab Results  Component Value Date   HGBA1C 5.5 08/11/2023      Assessment & Plan:   Primary hypertension Assessment & Plan: The patient's blood pressure is uncontrolled in the clinic today. Amlodipine  10 mg will be discontinued, and therapy will be initiated with Procardia  (nifedipine ) 60 mg daily. The patient is encouraged to continue taking carvedilol  6.25 mg twice daily and to follow up with cardiology as scheduled. A low-sodium diet and increased physical activity are also encouraged.   Orders: -     NIFEdipine  ER Osmotic Release; Take 1 tablet (60 mg total) by mouth daily.  Dispense: 30 tablet; Refill: 1  Unsteady gait when walking Assessment & Plan: The patient reports engaging in a significant amount of walking but experiences unsteadiness, leg fatigue, and a sensation that his legs "give out," contributing to imbalance. He is requesting a walking cane to assist with stability and prevent falls.   Orders: -     For home use only DME Other see comment  Note: This chart has been completed using Engineer, civil (consulting) software, and while attempts have been made to ensure accuracy, certain words and phrases may not be transcribed as intended.    Follow-up: Return in about 1 month (around 04/29/2024).   Matheson Vandehei, FNP

## 2024-03-29 NOTE — Patient Instructions (Addendum)
 I appreciate the opportunity to provide care to you today!    Follow up:  1 months  Labs: please stop by the lab today to get your blood drawn (BMP)  Hypertension Management  Your current blood pressure is above the target goal of <130/90 mmHg. To address this, please start taking procardia  60 mg daily and carvedilol  6.25 mg Twice daily  Medication Instructions: Take your blood pressure medication at the same time each day. After taking your medication, check your blood pressure at least an hour later. If your first reading is >140/90 mmHg, wait at least 10 minutes and recheck your blood pressure. Side Effects: In the initial days of therapy, you may experience dizziness or lightheadedness as your body adjusts to the lower blood pressure; this is expected. Diet and Lifestyle: Adhere to a low-sodium diet, limiting intake to less than 1500 mg daily, and increase your physical activity. Avoid over-the-counter NSAIDs such as ibuprofen  and naproxen  while on this medication. Hydration and Nutrition: Stay well-hydrated by drinking at least 64 ounces of water daily. Increase your servings of fruits and vegetables and avoid excessive sodium in your diet. Long-Term Considerations: Uncontrolled hypertension can increase the risk of cardiovascular diseases, including stroke, coronary artery disease, and heart failure.  Please report to the emergency department if your blood pressure exceeds 180/120 and is accompanied by symptoms such as headaches, chest pain, palpitations, blurred vision, or dizziness.    Please follow up if your symptoms worsen or fail to improve.   Attached with your AVS, you will find valuable resources for self-education. I highly recommend dedicating some time to thoroughly examine them.   Please continue to a heart-healthy diet and increase your physical activities. Try to exercise for at least five days a week.    It was a pleasure to see you and I look forward to  continuing to work together on your health and well-being. Please do not hesitate to call the office if you need care or have questions about your care.  In case of emergency, please visit the Emergency Department for urgent care, or contact our clinic at [phone] to schedule an appointment. We're here to help you!   Have a wonderful day and week. With Gratitude, Rodgerick Gilliand MSN, FNP-BC

## 2024-03-30 ENCOUNTER — Ambulatory Visit: Payer: Self-pay | Admitting: Family Medicine

## 2024-03-30 DIAGNOSIS — E559 Vitamin D deficiency, unspecified: Secondary | ICD-10-CM

## 2024-03-30 LAB — CBC WITH DIFFERENTIAL/PLATELET
Basophils Absolute: 0.1 x10E3/uL (ref 0.0–0.2)
Basos: 1 %
EOS (ABSOLUTE): 0.2 x10E3/uL (ref 0.0–0.4)
Eos: 2 %
Hematocrit: 47.5 % (ref 37.5–51.0)
Hemoglobin: 15.6 g/dL (ref 13.0–17.7)
Immature Grans (Abs): 0.1 x10E3/uL (ref 0.0–0.1)
Immature Granulocytes: 1 %
Lymphocytes Absolute: 2 x10E3/uL (ref 0.7–3.1)
Lymphs: 18 %
MCH: 30.3 pg (ref 26.6–33.0)
MCHC: 32.8 g/dL (ref 31.5–35.7)
MCV: 92 fL (ref 79–97)
Monocytes Absolute: 0.7 x10E3/uL (ref 0.1–0.9)
Monocytes: 6 %
Neutrophils Absolute: 8.5 x10E3/uL — ABNORMAL HIGH (ref 1.4–7.0)
Neutrophils: 72 %
Platelets: 290 x10E3/uL (ref 150–450)
RBC: 5.15 x10E6/uL (ref 4.14–5.80)
RDW: 13.4 % (ref 11.6–15.4)
WBC: 11.5 x10E3/uL — ABNORMAL HIGH (ref 3.4–10.8)

## 2024-03-30 LAB — CMP14+EGFR
ALT: 47 IU/L — ABNORMAL HIGH (ref 0–44)
AST: 24 IU/L (ref 0–40)
Albumin: 4.7 g/dL (ref 4.1–5.1)
Alkaline Phosphatase: 128 IU/L — ABNORMAL HIGH (ref 44–121)
BUN/Creatinine Ratio: 10 (ref 9–20)
BUN: 11 mg/dL (ref 6–20)
Bilirubin Total: 0.4 mg/dL (ref 0.0–1.2)
CO2: 19 mmol/L — ABNORMAL LOW (ref 20–29)
Calcium: 9.6 mg/dL (ref 8.7–10.2)
Chloride: 103 mmol/L (ref 96–106)
Creatinine, Ser: 1.09 mg/dL (ref 0.76–1.27)
Globulin, Total: 2.4 g/dL (ref 1.5–4.5)
Glucose: 98 mg/dL (ref 70–99)
Potassium: 4.3 mmol/L (ref 3.5–5.2)
Sodium: 140 mmol/L (ref 134–144)
Total Protein: 7.1 g/dL (ref 6.0–8.5)
eGFR: 90 mL/min/1.73 (ref >59)

## 2024-03-30 LAB — HEMOGLOBIN A1C
Est. average glucose Bld gHb Est-mCnc: 108 mg/dL
Hgb A1c MFr Bld: 5.4 % (ref 4.8–5.6)

## 2024-03-30 LAB — LIPID PANEL
Chol/HDL Ratio: 5.6 ratio — ABNORMAL HIGH (ref 0.0–5.0)
Cholesterol, Total: 212 mg/dL — ABNORMAL HIGH (ref 100–199)
HDL: 38 mg/dL — ABNORMAL LOW (ref >39)
LDL Chol Calc (NIH): 143 mg/dL — ABNORMAL HIGH (ref 0–99)
Triglycerides: 170 mg/dL — ABNORMAL HIGH (ref 0–149)
VLDL Cholesterol Cal: 31 mg/dL (ref 5–40)

## 2024-03-30 LAB — TSH+FREE T4
Free T4: 1.37 ng/dL (ref 0.82–1.77)
TSH: 1.67 u[IU]/mL (ref 0.450–4.500)

## 2024-03-30 LAB — VITAMIN D 25 HYDROXY (VIT D DEFICIENCY, FRACTURES): Vit D, 25-Hydroxy: 15.5 ng/mL — ABNORMAL LOW (ref 30.0–100.0)

## 2024-03-30 MED ORDER — AMPHETAMINE-DEXTROAMPHETAMINE 30 MG PO TABS: ORAL_TABLET | ORAL | 0 refills | Status: DC

## 2024-03-30 MED ORDER — VITAMIN D (ERGOCALCIFEROL) 1.25 MG (50000 UNIT) PO CAPS: 50000.0000 [IU] | ORAL_CAPSULE | ORAL | 1 refills | Status: AC

## 2024-03-30 NOTE — Telephone Encounter (Signed)
 Brandi informed patient on cane rx, sent to [facility]

## 2024-04-01 NOTE — Procedures (Signed)
     SLEEP STUDY REPORT Patient Information Study Date: 03/20/2024 Patient Name: Jay Montgomery Patient ID: [id number] Birth Date: March 02, 1986 Age: 38 Gender: Male BMI: 29.4 (W=229 lb, H=6' 2'') Referring Physician: Reche Finder, NP  TEST DESCRIPTION: Home sleep apnea testing was completed using the WatchPat, a Type 1 device, utilizing peripheral arterial tonometry (PAT), chest movement, actigraphy, pulse oximetry, pulse rate, body position and snore. AHI was calculated with apnea and hypopnea using valid sleep time as the denominator. RDI includes apneas, hypopneas, and RERAs. The data acquired and the scoring of sleep and all associated events were performed in accordance with the recommended standards and specifications as outlined in the AASM Manual for the Scoring of Sleep and Associated Events 2.2.0 (2015).  FINDINGS:  1. Severe Obstructive Sleep Apnea with AHI 65.1/hr.  2. No Central Sleep Apnea with pAHIc 5.1/hr.  3. Oxygen desaturations as low as 88%.  4. Severe snoring was present. O2 sats were < 88% for 0.2 min.  5. Total sleep time was 5 hrs and 51 min.  6. 22.2% of total sleep time was spent in REM sleep.  7. Normal sleep onset latency at 17 min.  8. Normal REM sleep onset latency at 91 min.  9. Total awakenings were 13. 10. Arrhythmia detection: None  DIAGNOSIS: Severe Obstructive Sleep Apnea (G47.33)  RECOMMENDATIONS: 1. Clinical correlation of these findings is necessary. The decision to treat obstructive sleep apnea (OSA) is usually based on the presence of apnea symptoms or the presence of associated medical conditions such as Hypertension, Congestive Heart Failure, Atrial Fibrillation or Obesity. The most common symptoms of OSA are snoring, gasping for breath while sleeping, daytime sleepiness and fatigue. 2. Initiating apnea therapy is recommended given the presence of symptoms and/or associated conditions. Recommend proceeding with one of the  following:  a. Auto-CPAP therapy with a pressure range of 5-20cm H2O.  b. An oral appliance (OA) that can be obtained from certain dentists with expertise in sleep medicine. These are primarily of use in non-obese patients with mild and moderate disease.  c. An ENT consultation which may be useful to look for specific causes of obstruction and possible treatment options.  d. If patient is intolerant to PAP therapy, consider referral to ENT for evaluation for hypoglossal nerve stimulator. 3. Close follow-up is necessary to ensure success with CPAP or oral appliance therapy for maximum benefit . 4. A follow-up oximetry study on CPAP is recommended to assess the adequacy of therapy and determine the need for supplemental oxygen or the potential need for Bi-level therapy. An arterial blood gas to determine the adequacy of baseline ventilation and oxygenation should also be considered. 5. Healthy sleep recommendations include: adequate nightly sleep (normal 7-9 hrs/night), avoidance of caffeine after noon and alcohol near bedtime, and maintaining a sleep environment that is cool, dark and quiet. 6. Weight loss for overweight patients is recommended. Even modest amounts of weight loss can significantly improve the severity of sleep apnea. 7. Snoring recommendations include: weight loss where appropriate, side sleeping, and avoidance of alcohol before bed. 8. Operation of motor vehicle should be avoided when sleepy.  Signature: Wilbert Bihari, MD; [facility]); Diplomat, American Board of Sleep Medicine Electronically Signed: 04/01/2024 3:44:23 PM 03/20/2024,[id number],02/03/1986,Male Page 2 of 5 Re

## 2024-04-02 ENCOUNTER — Ambulatory Visit: Payer: MEDICAID | Attending: Family

## 2024-04-02 ENCOUNTER — Telehealth: Payer: Self-pay | Admitting: Family Medicine

## 2024-04-02 DIAGNOSIS — R0683 Snoring: Secondary | ICD-10-CM

## 2024-04-02 DIAGNOSIS — G4730 Sleep apnea, unspecified: Secondary | ICD-10-CM

## 2024-04-02 NOTE — Telephone Encounter (Signed)
 Placard  Noted Copied Sleeved Original placed in provider box Copy placed front desk folder

## 2024-04-04 ENCOUNTER — Telehealth: Payer: Self-pay

## 2024-04-04 DIAGNOSIS — G4733 Obstructive sleep apnea (adult) (pediatric): Secondary | ICD-10-CM

## 2024-04-04 DIAGNOSIS — I10 Essential (primary) hypertension: Secondary | ICD-10-CM

## 2024-04-04 DIAGNOSIS — G4730 Sleep apnea, unspecified: Secondary | ICD-10-CM

## 2024-04-04 DIAGNOSIS — E782 Mixed hyperlipidemia: Secondary | ICD-10-CM

## 2024-04-04 DIAGNOSIS — R0683 Snoring: Secondary | ICD-10-CM

## 2024-04-04 LAB — METANEPHRINES, URINE, 24 HOUR
Metaneph Total, Ur: 171 ug/L
Metanephrines, 24H Ur: 137 ug/(24.h) (ref 58–276)
Normetanephrine, 24H Ur: 441 ug/(24.h) (ref 156–729)
Normetanephrine, Ur: 551 ug/L

## 2024-04-04 LAB — CATECHOLAMINES, FRACTIONATED, URINE, 24 HOUR
Dopamine , 24H Ur: 239 ug/(24.h) (ref 0–510)
Dopamine, Rand Ur: 299 ug/L
Epinephrine, 24H Ur: 8 ug/(24.h) (ref 0–20)
Epinephrine, Rand Ur: 10 ug/L
Norepinephrine, 24H Ur: 38 ug/(24.h) (ref 0–135)
Norepinephrine, Rand Ur: 47 ug/L

## 2024-04-04 NOTE — Telephone Encounter (Signed)
-----   Message from Wilbert Bihari sent at 04/01/2024  3:47 PM EDT ----- Please let patient know that they have sleep apnea.  Recommend therapeutic CPAP titration for treatment of patient's sleep disordered breathing.

## 2024-04-04 NOTE — Telephone Encounter (Signed)
 Notified patient of sleeps study results and recommendations. All questions were answered and patient verbalized understanding. CPAP Titration ordered today.

## 2024-04-05 ENCOUNTER — Ambulatory Visit: Admitting: Family Medicine

## 2024-04-05 DIAGNOSIS — Z79899 Other long term (current) drug therapy: Secondary | ICD-10-CM | POA: Diagnosis not present

## 2024-04-06 ENCOUNTER — Other Ambulatory Visit: Payer: Self-pay | Admitting: Family Medicine

## 2024-04-06 NOTE — Telephone Encounter (Signed)
 Put in providers box to sign

## 2024-04-09 ENCOUNTER — Ambulatory Visit: Admitting: Neurology

## 2024-04-09 ENCOUNTER — Encounter: Payer: Self-pay | Admitting: Neurology

## 2024-04-09 VITALS — BP 153/114 | HR 111 | Ht 74.0 in | Wt 242.0 lb

## 2024-04-09 DIAGNOSIS — G44209 Tension-type headache, unspecified, not intractable: Secondary | ICD-10-CM | POA: Diagnosis not present

## 2024-04-09 DIAGNOSIS — I67848 Other cerebrovascular vasospasm and vasoconstriction: Secondary | ICD-10-CM

## 2024-04-09 DIAGNOSIS — G43009 Migraine without aura, not intractable, without status migrainosus: Secondary | ICD-10-CM | POA: Diagnosis not present

## 2024-04-09 DIAGNOSIS — R519 Headache, unspecified: Secondary | ICD-10-CM

## 2024-04-09 MED ORDER — DIVALPROEX SODIUM ER 500 MG PO TB24: 500.0000 mg | ORAL_TABLET | Freq: Every day | ORAL | 5 refills | Status: AC

## 2024-04-09 NOTE — Telephone Encounter (Signed)
 Completed and given to the front desk to call pt to collect

## 2024-04-09 NOTE — Patient Instructions (Addendum)
 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  Recommend trial of Depakote  ER 500 mg daily as well as use some as needed Tylenol  Extra Strength 2 tablets not more than twice a day for not more than 3 days a week to avoid analgesic rebound.  I also advised him to cut back his oxycodone  which may be contributing to analgesic rebound headache.  I also discussed doing regular neck stretching exercises.  Return for follow-up in the future in 6 months with nurse practitioner or call earlier if necessary.  Tension Headache, Adult A tension headache is a feeling of pain, pressure, or aching over the front and sides of the head. The pain can be dull, or it can feel tight. There are two types of tension headache: Episodic tension headache. This is when the headaches happen fewer than 15 days a month. Chronic tension headache. This is when the headaches happen more than 15 days a month during a 60-month period. A tension headache can last from 30 minutes to several days. It is the most common kind of headache. Tension headaches are not normally associated with nausea or vomiting, and they do not get worse with physical activity. What are the causes? The exact cause of this condition is not known. Tension headaches are often triggered by stress, anxiety, or depression. Other triggers may include: Alcohol. Too much caffeine or caffeine withdrawal. Respiratory infections, such as colds, flu, or sinus infections. Dental problems or teeth clenching. Fatigue. Holding your head and neck in the same position for a long period of time, such as while using a computer. Smoking. Arthritis of the neck. What are the signs or symptoms? Symptoms of this condition include: A feeling of pressure or tightness around the head. Dull, aching head pain. Pain over the front and sides of the head. Tenderness in  the muscles of the head, neck, and shoulders. How is this diagnosed? This condition may be diagnosed based on your symptoms, your medical history, and a physical exam. If your symptoms are severe or unusual, you may have imaging tests, such as a CT scan or an MRI of your head. Your vision may also be checked. How is this treated? This condition may be treated with lifestyle changes and with medicines that help relieve symptoms. Follow these instructions at home: Managing pain Take over-the-counter and prescription medicines only as told by your health care provider. When you have a headache, lie down in a dark, quiet room. If directed, put ice on your head and neck. To do this: Put ice in a plastic bag. Place a towel between your skin and the bag. Leave the ice on for 20 minutes, 2-3 times a day. Remove the ice if your skin turns bright red. This is very important. If you cannot feel pain, heat, or cold, you have a greater risk of damage to the area. If directed, apply heat to the back of your neck as often as told by your health care provider. Use the heat source that your health care provider recommends, such as a moist heat pack or a heating pad. Place a towel between your skin and the heat source. Leave the heat on for 20-30 minutes. Remove the heat if your skin turns bright red. This is especially important if you are unable to feel pain, heat, or cold. You have a greater risk of getting burned. Eating and  drinking Eat meals on a regular schedule. If you drink alcohol: Limit how much you have to: 0-1 drink a day for women who are not pregnant. 0-2 drinks a day for men. Know how much alcohol is in your drink. In the [place] one drink equals one 12 oz bottle of beer (355 mL), one 5 oz glass of wine (148 mL), or one 1 oz glass of hard liquor (44 mL). Drink enough fluid to keep your urine pale yellow. Decrease your caffeine intake, or stop using caffeine. Lifestyle Get 7-9 hours of sleep  each night, or get the amount of sleep recommended by your health care provider. At bedtime, remove computers, phones, and tablets from your room. Find ways to manage your stress. This may include: Exercise. Deep breathing exercises. Yoga. Listening to music. Positive mental imagery. Try to sit up straight and avoid tensing your muscles. Do not use any products that contain nicotine or tobacco. These include cigarettes, chewing tobacco, and vaping devices, such as e-cigarettes. If you need help quitting, ask your health care provider. General instructions  Avoid any headache triggers. Keep a journal to help find out what may trigger your headaches. For example, write down: What you eat and drink. How much sleep you get. Any change to your diet or medicines. Keep all follow-up visits. This is important. Contact a health care provider if: Your headache does not get better. Your headache comes back. You are sensitive to sounds, light, or smells because of a headache. You have nausea or you vomit. Your stomach hurts. Get help right away if: You suddenly develop a severe headache, along with any of the following: A stiff neck. Nausea and vomiting. Confusion. Weakness in one part or one side of your body. Double vision or loss of vision. Shortness of breath. Rash. Unusual sleepiness. Fever or chills. Trouble speaking. Pain in your eye or ear. Trouble walking or balancing. Feeling faint or passing out. Summary A tension headache is a feeling of pain, pressure, or aching over the front and sides of the head. A tension headache can last from 30 minutes to several days. It is the most common kind of headache. This condition may be diagnosed based on your symptoms, your medical history, and a physical exam. This condition may be treated with lifestyle changes and with medicines that help relieve symptoms. This information is not intended to replace advice given to you by your health  care provider. Make sure you discuss any questions you have with your health care provider. Document Revised: 06/03/2023 Document Reviewed: 06/03/2023 Elsevier Patient Education  2024 [employer] Inc[employer]  Neck Exercises Ask your health care provider which exercises are safe for you. Do exercises exactly as told by your health care provider and adjust them as directed. It is normal to feel mild stretching, pulling, tightness, or discomfort as you do these exercises. Stop right away if you feel sudden pain or your pain gets worse. Do not begin these exercises until told by your health care provider. Neck exercises can be important for many reasons. They can improve strength and maintain flexibility in your neck, which will help your upper back and prevent neck pain. Stretching exercises Rotation neck stretching  Sit in a chair or stand up. Place your feet flat on the floor, shoulder-width apart. Slowly turn your head (rotate) to the right until a slight stretch is felt. Turn it all the way to the right so you can look over your right shoulder. Do not tilt or tip  your head. Hold this position for 10-30 seconds. Slowly turn your head (rotate) to the left until a slight stretch is felt. Turn it all the way to the left so you can look over your left shoulder. Do not tilt or tip your head. Hold this position for 10-30 seconds. Repeat __________ times. Complete this exercise __________ times a day. Neck retraction  Sit in a sturdy chair or stand up. Look straight ahead. Do not bend your neck. Use your fingers to push your chin backward (retraction). Do not bend your neck for this movement. Continue to face straight ahead. If you are doing the exercise properly, you will feel a slight sensation in your throat and a stretch at the back of your neck. Hold the stretch for 1-2 seconds. Repeat __________ times. Complete this exercise __________ times a day. Strengthening exercises Neck press  Lie on your  back on a firm bed or on the floor with a pillow under your head. Use your neck muscles to push your head down on the pillow and straighten your spine. Hold the position as well as you can. Keep your head facing up (in a neutral position) and your chin tucked. Slowly count to 5 while holding this position. Repeat __________ times. Complete this exercise __________ times a day. Isometrics These are exercises in which you strengthen the muscles in your neck while keeping your neck still (isometrics). Sit in a supportive chair and place your hand on your forehead. Keep your head and face facing straight ahead. Do not flex or extend your neck while doing isometrics. Push forward with your head and neck while pushing back with your hand. Hold for 10 seconds. Do the sequence again, this time putting your hand against the back of your head. Use your head and neck to push backward against the hand pressure. Finally, do the same exercise on either side of your head, pushing sideways against the pressure of your hand. Repeat __________ times. Complete this exercise __________ times a day. Prone head lifts  Lie face-down (prone position), resting on your elbows so that your chest and upper back are raised. Start with your head facing downward, near your chest. Position your chin either on or near your chest. Slowly lift your head upward. Lift until you are looking straight ahead. Then continue lifting your head as far back as you can comfortably stretch. Hold your head up for 5 seconds. Then slowly lower it to your starting position. Repeat __________ times. Complete this exercise __________ times a day. Supine head lifts  Lie on your back (supine position), bending your knees to point to the ceiling and keeping your feet flat on the floor. Lift your head slowly off the floor, raising your chin toward your chest. Hold for 5 seconds. Repeat __________ times. Complete this exercise __________ times a  day. Scapular retraction  Stand with your arms at your sides. Look straight ahead. Slowly pull both shoulders (scapulae) backward and downward (retraction) until you feel a stretch between your shoulder blades in your upper back. Hold for 10-30 seconds. Relax and repeat. Repeat __________ times. Complete this exercise __________ times a day. Contact a health care provider if: Your neck pain or discomfort gets worse when you do an exercise. Your neck pain or discomfort does not improve within 2 hours after you exercise. If you have any of these problems, stop exercising right away. Do not do the exercises again unless your health care provider says that you can. Get help right  away if: You develop sudden, severe neck pain. If this happens, stop exercising right away. Do not do the exercises again unless your health care provider says that you can. This information is not intended to replace advice given to you by your health care provider. Make sure you discuss any questions you have with your health care provider. Document Revised: 03/03/2021 Document Reviewed: 03/03/2021 Elsevier Patient Education  2024 [employer].

## 2024-04-09 NOTE — Progress Notes (Unsigned)
 [facility] [address] [place]. [place] [address] [phone]       OFFICE FOLLOW-UP NOTE  [name] Date of Birth:  06/19/1986 Medical Record Number:  [id number]   HPI: Mr. Doty is a 38 year old Caucasian male seen today for office follow-up visit following hospital consultation for severe headaches in November 2024.  His past medical history of bipolar disorder, ADHD, PTSD, anxiety depression, congenital spinal stenosis and using vape.  He presented on 08/10/2023 with severe headache on 2 separate occasions.  The first episode occurred after some coughing and vomiting developed sudden onset of abrupt severe right sided headache which was not debilitating with light sensitivity.  He took some ibuprofen  and the headache resolved.  Next morning he woke up and he sneezed and he developed similar debilitating headache with numbness and pain on the right side of the face which prompted him to go to the emergency room.  CT head showed no acute abnormality but CT angiogram showed multifocal narrowing of the 2nd and 3rd order branches of both middle cerebral arteries.  MRI scan of the brain was normal.  Patient was diagnosed to have cerebral reversible vasoconstriction syndrome and was started on Topamax  50 mg twice daily as well as verapamil  40 mg every 8 hourly to reduce vasospasm and strict blood pressure control.  And psychiatry was consulted to discuss alternatives to his vasoactive anxiety depression medications.  Patient states his headache never really went away and is now having chronic daily headaches.  The headaches usually start by mid afternoon.  These are bifrontal throbbing 8/10 in severity.  Usually there is no nausea or vomiting though he does get some occasional light and sounds and fatigue.  He feels better if he lies down.  He has not been taking much Tylenol  as he is heard that it hurt his liver.  He does however take oxycodone  5 mg tablets every 4 hourly  for his severe back pain.  He did feel better initially when I started him on Depakote  and Topamax  but for unclear reason he is now off those medications.  He was seen in the office on 09/27/2023 and saw Dr. Margaret who ordered repeat MRI scans of brain which was done on 10/03/2023 which was normal as well as follow-up CT angiogram on 10/03/2023 showed significant improvement in the vasospasm the 2nd and 3rd order branches of middle cerebral arteries with only mild persistent narrowing of the M3 portion of the left middle cerebral artery but a lot improved compared to the previous study from November 2024.  Patient does also complain of neck pain and spasm.  He does not do any neck stretching exercises.  He is currently back on Wellbutrin  and does have regular follow-ups with his psychiatrist.  ROS:   14 system review of systems is positive for headache, neck pain or back pain, difficulty walking all other systems negative  PMH:  Past Medical History:  Diagnosis Date   Allergy    Anxiety    Chicken pox    Depression    Headache    due to CSF leak   Heart murmur    as a child   Spinal stenosis     Social History:  Social History   Socioeconomic History   Marital status: Legally Separated    Spouse name: [name]   Number of children: Not on file   Years of education: Not on file   Highest education level: 12th grade  Occupational History  Occupation: Designer, jewellery  Tobacco Use   Smoking status: Former    Current packs/day: 1.00    Average packs/day: 1 pack/day for 20.0 years (20.0 ttl pk-yrs)    Types: Cigarettes   Smokeless tobacco: Never  Vaping Use   Vaping status: Some Days   Substances: Nicotine, CBD, Flavoring  Substance and Sexual Activity   Alcohol use: Not Currently    Comment: Haven't had alcohol in a year due to medication   Drug use: No    Comment: vapes w/ delta 8   Sexual activity: Yes    Partners: Female    Birth control/protection: Other-see comments     Comment: Essure  Other Topics Concern   Not on file  Social History Narrative   Pt lives with wife    Pt not working    Social Drivers of Corporate investment banker Strain: Low Risk  (03/25/2024)   Overall Financial Resource Strain (CARDIA)    Difficulty of Paying Living Expenses: Not very hard  Recent Concern: Physicist, medical Strain - High Risk (01/22/2024)   Overall Financial Resource Strain (CARDIA)    Difficulty of Paying Living Expenses: Very hard  Food Insecurity: Food Insecurity Present (03/27/2024)   Hunger Vital Sign    Worried About Running Out of Food in the Last Year: Sometimes true    Ran Out of Food in the Last Year: Sometimes true  Transportation Needs: No Transportation Needs (03/27/2024)   PRAPARE - Administrator, Civil Service (Medical): No    Lack of Transportation (Non-Medical): No  Physical Activity: Inactive (03/25/2024)   Exercise Vital Sign    Days of Exercise per Week: 0 days    Minutes of Exercise per Session: Not on file  Stress: Stress Concern Present (03/27/2024)   [employer] of Occupational Health - Occupational Stress Questionnaire    Feeling of Stress: Very much  Social Connections: Moderately Isolated (03/25/2024)   Social Connection and Isolation Panel    Frequency of Communication with Friends and Family: More than three times a week    Frequency of Social Gatherings with Friends and Family: Once a week    Attends Religious Services: Never    Database administrator or Organizations: No    Attends Engineer, structural: Not on file    Marital Status: Living with partner  Intimate Partner Violence: Not At Risk (08/10/2023)   Humiliation, Afraid, Rape, and Kick questionnaire    Fear of Current or Ex-Partner: No    Emotionally Abused: No    Physically Abused: No    Sexually Abused: No    Medications:   Current Outpatient Medications on File Prior to Visit  Medication Sig Dispense Refill    amphetamine -dextroamphetamine  (ADDERALL) 30 MG tablet Take one tablet daily. 30 tablet 0   ARIPiprazole  (ABILIFY ) 10 MG tablet Take 1 tablet (10 mg total) by mouth 2 (two) times daily. 60 tablet 2   buPROPion  (WELLBUTRIN  XL) 150 MG 24 hr tablet Take two tablets every morning. 60 tablet 2   busPIRone  (BUSPAR ) 10 MG tablet Take 1 tablet (10 mg total) by mouth 3 (three) times daily. 90 tablet 2   carvedilol  (COREG ) 6.25 MG tablet Take 1 tablet (6.25 mg total) by mouth 2 (two) times daily. 180 tablet 1   gabapentin  (NEURONTIN ) 300 MG capsule Take 1 capsule (300 mg total) by mouth at bedtime. 90 capsule 3   lithium  carbonate 150 MG capsule Take 1 capsule (150 mg total) by mouth  at bedtime. 30 capsule 2   NIFEdipine  (PROCARDIA  XL) 60 MG 24 hr tablet Take 1 tablet (60 mg total) by mouth daily. 30 tablet 1   oxyCODONE  (OXY IR/ROXICODONE ) 5 MG immediate release tablet Take 5 mg by mouth every 4 (four) hours as needed.     rosuvastatin  (CRESTOR ) 10 MG tablet Take 1 tablet (10 mg total) by mouth daily. 90 tablet 3   Vitamin D , Ergocalciferol , (DRISDOL ) 1.25 MG (50000 UNIT) CAPS capsule Take 1 capsule (50,000 Units total) by mouth every 7 (seven) days. 27 capsule 1   No current facility-administered medications on file prior to visit.    Allergies:   Allergies  Allergen Reactions   Hydroxyzine  Other (See Comments)    Dizziness, mental fogginess, nausea   Hydrocodone -Acetaminophen  Hives, Itching and Rash    Physical Exam General: well developed, well nourished pleasant young Caucasian male., seated, in no evident distress Head: head normocephalic and atraumatic.  Neck: supple with no carotid or supraclavicular bruits Cardiovascular: regular rate and rhythm, no murmurs Musculoskeletal: no deformity Skin:  no rash/petichiae Vascular:  Normal pulses all extremities Vitals:   04/09/24 1539 04/09/24 1545  BP: (!) 159/118 (!) 153/114  Pulse: (!) 113 (!) 111   Neurologic Exam Mental Status: Awake  and fully alert. Oriented to place and time. Recent and remote memory intact. Attention span, concentration and fund of knowledge appropriate. Mood and affect appropriate.  Cranial Nerves: Fundoscopic exam reveals sharp disc margins. Pupils equal, briskly reactive to light. Extraocular movements full without nystagmus. Visual fields full to confrontation. Hearing intact. Facial sensation intact. Face, tongue, palate moves normally and symmetrically.  Motor: Normal bulk and tone. Normal strength in all tested extremity muscles. Sensory.: intact to touch ,pinprick .position and vibratory sensation.  Coordination: Rapid alternating movements normal in all extremities. Finger-to-nose and heel-to-shin performed accurately bilaterally. Gait and Station: Arises from chair with  difficulty.  Uses a cane.   Gait is antalgic with favoring his back. Reflexes: 1+ and symmetric. Toes downgoing.   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  There may also be component of analgesic rebound given taking daily multiple doses of oxycodone .     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  Recommend trial of Depakote  ER 500 mg daily as well as use some as needed Tylenol  Extra Strength 2 tablets not more than twice a day for not more than 3 days a week to avoid analgesic rebound.  I also advised him to cut back his oxycodone  which may be contributing to analgesic rebound headache.  I also discussed doing regular neck stretching exercises.  Return for follow-up in the future in 6 months with nurse practitioner or call earlier if necessary.  Greater than 50% of time during this 45 minute visit was spent on  counseling,explanation of diagnosis, planning of further management, discussion with patient and family and coordination of care Eather Popp, MD Note: This document was prepared with digital dictation and possible smart phrase technology. Any transcriptional errors that result from this process are unintentional

## 2024-04-10 ENCOUNTER — Telehealth (HOSPITAL_BASED_OUTPATIENT_CLINIC_OR_DEPARTMENT_OTHER): Payer: Self-pay | Admitting: Licensed Clinical Social Worker

## 2024-04-10 NOTE — Telephone Encounter (Signed)
 H&V Care Navigation CSW Progress Note  Clinical Social Worker contacted patient by phone to f/u on community resources sent to pt. No answer at [phone]. Left voicemail.  Patient is participating in a Managed Medicaid Plan:  Yes- healthy blue  SDOH Screenings   Food Insecurity: Food Insecurity Present (03/27/2024)  Housing: Low Risk  (03/27/2024)  Recent Concern: Housing - High Risk (01/22/2024)  Transportation Needs: No Transportation Needs (03/27/2024)  Utilities: Not At Risk (03/27/2024)  Alcohol Screen: Low Risk  (03/25/2024)  Depression (PHQ2-9): High Risk (02/14/2024)  Financial Resource Strain: Low Risk  (03/25/2024)  Recent Concern: Financial Resource Strain - High Risk (01/22/2024)  Physical Activity: Inactive (03/25/2024)  Social Connections: Moderately Isolated (03/25/2024)  Stress: Stress Concern Present (03/27/2024)  Tobacco Use: Medium Risk (04/09/2024)  Health Literacy: Adequate Health Literacy (03/27/2024)    Marit Lark, MSW, LCSW Clinical Social Worker II [facility] Heart/Vascular Care Navigation  [phone]- work cell phone (preferred)

## 2024-04-12 ENCOUNTER — Telehealth: Payer: Self-pay | Admitting: Licensed Clinical Social Worker

## 2024-04-12 NOTE — Telephone Encounter (Signed)
 H&V Care Navigation CSW Progress Note  Clinical Social Worker contacted patient by phone to f/u on community resources sent to pt. No answer at [phone]. Left voicemail. Remain available should pt re-engage with care navigation team.  Patient is participating in a Managed Medicaid Plan:  Yes- healthy blue  SDOH Screenings   Food Insecurity: Food Insecurity Present (03/27/2024)  Housing: Low Risk  (03/27/2024)  Recent Concern: Housing - High Risk (01/22/2024)  Transportation Needs: No Transportation Needs (03/27/2024)  Utilities: Not At Risk (03/27/2024)  Alcohol Screen: Low Risk  (03/25/2024)  Depression (PHQ2-9): High Risk (02/14/2024)  Financial Resource Strain: Low Risk  (03/25/2024)  Recent Concern: Financial Resource Strain - High Risk (01/22/2024)  Physical Activity: Inactive (03/25/2024)  Social Connections: Moderately Isolated (03/25/2024)  Stress: Stress Concern Present (03/27/2024)  Tobacco Use: Medium Risk (04/09/2024)  Health Literacy: Adequate Health Literacy (03/27/2024)    Marit Lark, MSW, LCSW Clinical Social Worker II [facility] Heart/Vascular Care Navigation  [phone]- work cell phone (preferred)

## 2024-04-16 ENCOUNTER — Encounter: Payer: Self-pay | Admitting: Adult Health

## 2024-04-16 ENCOUNTER — Telehealth (INDEPENDENT_AMBULATORY_CARE_PROVIDER_SITE_OTHER): Admitting: Adult Health

## 2024-04-16 DIAGNOSIS — G4700 Insomnia, unspecified: Secondary | ICD-10-CM

## 2024-04-16 DIAGNOSIS — Z79899 Other long term (current) drug therapy: Secondary | ICD-10-CM

## 2024-04-16 DIAGNOSIS — F411 Generalized anxiety disorder: Secondary | ICD-10-CM

## 2024-04-16 DIAGNOSIS — F319 Bipolar disorder, unspecified: Secondary | ICD-10-CM

## 2024-04-16 DIAGNOSIS — F909 Attention-deficit hyperactivity disorder, unspecified type: Secondary | ICD-10-CM

## 2024-04-16 MED ORDER — AMPHETAMINE-DEXTROAMPHETAMINE 30 MG PO TABS: ORAL_TABLET | ORAL | 0 refills | Status: DC

## 2024-04-16 MED ORDER — BUSPIRONE HCL 10 MG PO TABS: 10.0000 mg | ORAL_TABLET | Freq: Three times a day (TID) | ORAL | 2 refills | Status: DC

## 2024-04-16 MED ORDER — ARIPIPRAZOLE 10 MG PO TABS: 10.0000 mg | ORAL_TABLET | Freq: Two times a day (BID) | ORAL | 2 refills | Status: DC

## 2024-04-16 MED ORDER — LITHIUM CARBONATE 150 MG PO CAPS: 150.0000 mg | ORAL_CAPSULE | Freq: Every day | ORAL | 2 refills | Status: DC

## 2024-04-16 MED ORDER — BUPROPION HCL ER (XL) 150 MG PO TB24
ORAL_TABLET | ORAL | 2 refills | Status: DC
Start: 2024-04-16 — End: 2024-06-18

## 2024-04-16 NOTE — Progress Notes (Signed)
 Jay Montgomery [id number] 1986/03/19 38 y.o.  Virtual Visit via Video Note  I connected with pt @ on 04/16/24 at  8:30 AM EDT by a video enabled telemedicine application and verified that I am speaking with the correct person using two identifiers.   I discussed the limitations of evaluation and management by telemedicine and the availability of in person appointments. The patient expressed understanding and agreed to proceed.  I discussed the assessment and treatment plan with the patient. The patient was provided an opportunity to ask questions and all were answered. The patient agreed with the plan and demonstrated an understanding of the instructions.   The patient was advised to call back or seek an in-person evaluation if the symptoms worsen or if the condition fails to improve as anticipated.  I provided 25 minutes of non-face-to-face time during this encounter.  The patient was located at home.  The provider was located at [facility].   Angeline LOISE Sayers, NP   Subjective:   Patient ID:  Jay Montgomery is a 38 y.o. (DOB [birth year]) male.  Chief Complaint: No chief complaint on file.   HPI Erez Mccallum Siler presents for follow-up of GAD, insomnia, ADHD, and BPD 1.  Working with pain management - monthly.  Describes mood today as "it fluctuates". Pleasant. Flat. Denies tearfulness. Mood symptoms - reports depression - with everything on my plate right now. Stating some days I feel useless. Reports concerns about his physical and mental health. Reports low interest and motivation. Reports increased anxiety at times - it's pretty high. Reports increased irritability - small things upset me and ruin my whole day. Reports recent panic attacks - 2 to 3 over this past weekend. Reports difficulties leaving the house. Has a service dog he's working with to help him when he has to go out. Reports worry, rumination and over thinking - stressed.  Denies recent  mania. Reports mood as variable - having ups and downs. Stating I feel like I'm managing with everything I have going on - things could be a lot better. Taking medications as prescribed. Energy levels lower. Active, does not have a regular exercise routine with physical disabilities. Enjoys some usual interests and activities. Married. Lives with wife and family. Appetite adequate. Weight gain - 235 to 242 pounds. Reports sleep is variable. Averages 6 hours of broken sleep. Reports daytime napping - 3 to 4 hours a day.   Focus and concentration a little off - bouncing from task to task. Completing minimal tasks - reports difficulties completing self care. Unable to manage household - reports his niece is living with him to help with his care. Reports he is unable to work with current physical limitations and medical issues. Denies SI or HI. Denies AH or VH. Denies self harm. Denies substance use.   Review of Systems:  Review of Systems  Musculoskeletal:  Negative for gait problem.  Neurological:  Negative for tremors.  Psychiatric/Behavioral:         Please refer to HPI    Medications: I have reviewed the patient's current medications.  Current Outpatient Medications  Medication Sig Dispense Refill   amphetamine -dextroamphetamine  (ADDERALL) 30 MG tablet Take one tablet daily. 30 tablet 0   ARIPiprazole  (ABILIFY ) 10 MG tablet Take 1 tablet (10 mg total) by mouth 2 (two) times daily. 60 tablet 2   buPROPion  (WELLBUTRIN  XL) 150 MG 24 hr tablet Take two tablets every morning. 60 tablet 2   busPIRone  (BUSPAR ) 10 MG tablet Take  1 tablet (10 mg total) by mouth 3 (three) times daily. 90 tablet 2   carvedilol  (COREG ) 6.25 MG tablet Take 1 tablet (6.25 mg total) by mouth 2 (two) times daily. 180 tablet 1   divalproex  (DEPAKOTE  ER) 500 MG 24 hr tablet Take 1 tablet (500 mg total) by mouth daily. 30 tablet 5   gabapentin  (NEURONTIN ) 300 MG capsule Take 1 capsule (300 mg total) by mouth at  bedtime. 90 capsule 3   lithium  carbonate 150 MG capsule Take 1 capsule (150 mg total) by mouth at bedtime. 30 capsule 2   NIFEdipine  (PROCARDIA  XL) 60 MG 24 hr tablet Take 1 tablet (60 mg total) by mouth daily. 30 tablet 1   oxyCODONE  (OXY IR/ROXICODONE ) 5 MG immediate release tablet Take 5 mg by mouth every 4 (four) hours as needed.     rosuvastatin  (CRESTOR ) 10 MG tablet Take 1 tablet (10 mg total) by mouth daily. 90 tablet 3   Vitamin D , Ergocalciferol , (DRISDOL ) 1.25 MG (50000 UNIT) CAPS capsule Take 1 capsule (50,000 Units total) by mouth every 7 (seven) days. 27 capsule 1   No current facility-administered medications for this visit.    Medication Side Effects: None  Allergies:  Allergies  Allergen Reactions   Hydroxyzine  Other (See Comments)    Dizziness, mental fogginess, nausea   Hydrocodone -Acetaminophen  Hives, Itching and Rash    Past Medical History:  Diagnosis Date   Allergy    Anxiety    Chicken pox    Depression    Headache    due to CSF leak   Heart murmur    as a child   Spinal stenosis     Family History  Problem Relation Age of Onset   Arthritis Mother    Hypertension Mother    COPD Mother    Emphysema Mother    Bipolar disorder Mother    Hypertension Father    Arthritis Father    Diabetes Father    Neuropathy Father    Bipolar disorder Sister    Schizophrenia Sister    ADD / ADHD Sister    Anxiety disorder Sister    Atrial fibrillation Sister     Social History   Socioeconomic History   Marital status: Legally Separated    Spouse name: [name]   Number of children: Not on file   Years of education: Not on file   Highest education level: 12th grade  Occupational History   Occupation: Designer, jewellery  Tobacco Use   Smoking status: Former    Current packs/day: 1.00    Average packs/day: 1 pack/day for 20.0 years (20.0 ttl pk-yrs)    Types: Cigarettes   Smokeless tobacco: Never  Vaping Use   Vaping status: Some Days    Substances: Nicotine, CBD, Flavoring  Substance and Sexual Activity   Alcohol use: Not Currently    Comment: Haven't had alcohol in a year due to medication   Drug use: No    Comment: vapes w/ delta 8   Sexual activity: Yes    Partners: Female    Birth control/protection: Other-see comments    Comment: Essure  Other Topics Concern   Not on file  Social History Narrative   Pt lives with wife    Pt not working    Social Drivers of Corporate investment banker Strain: Low Risk  (03/25/2024)   Overall Financial Resource Strain (CARDIA)    Difficulty of Paying Living Expenses: Not very hard  Recent Concern: Physicist, medical  Strain - High Risk (01/22/2024)   Overall Financial Resource Strain (CARDIA)    Difficulty of Paying Living Expenses: Very hard  Food Insecurity: Food Insecurity Present (03/27/2024)   Hunger Vital Sign    Worried About Running Out of Food in the Last Year: Sometimes true    Ran Out of Food in the Last Year: Sometimes true  Transportation Needs: No Transportation Needs (03/27/2024)   PRAPARE - Administrator, Civil Service (Medical): No    Lack of Transportation (Non-Medical): No  Physical Activity: Inactive (03/25/2024)   Exercise Vital Sign    Days of Exercise per Week: 0 days    Minutes of Exercise per Session: Not on file  Stress: Stress Concern Present (03/27/2024)   [employer] of Occupational Health - Occupational Stress Questionnaire    Feeling of Stress: Very much  Social Connections: Moderately Isolated (03/25/2024)   Social Connection and Isolation Panel    Frequency of Communication with Friends and Family: More than three times a week    Frequency of Social Gatherings with Friends and Family: Once a week    Attends Religious Services: Never    Database administrator or Organizations: No    Attends Engineer, structural: Not on file    Marital Status: Living with partner  Intimate Partner Violence: Not At Risk (08/10/2023)    Humiliation, Afraid, Rape, and Kick questionnaire    Fear of Current or Ex-Partner: No    Emotionally Abused: No    Physically Abused: No    Sexually Abused: No    Past Medical History, Surgical history, Social history, and Family history were reviewed and updated as appropriate.   Please see review of systems for further details on the patient's review from today.   Objective:   Physical Exam:  There were no vitals taken for this visit.  Physical Exam Constitutional:      General: He is not in acute distress. Musculoskeletal:        General: No deformity.  Neurological:     Mental Status: He is alert and oriented to person, place, and time.     Coordination: Coordination normal.  Psychiatric:        Attention and Perception: Attention and perception normal. He does not perceive auditory or visual hallucinations.        Mood and Affect: Mood normal. Mood is not anxious or depressed. Affect is not labile, blunt, angry or inappropriate.        Speech: Speech normal.        Behavior: Behavior normal.        Thought Content: Thought content normal. Thought content is not paranoid or delusional. Thought content does not include homicidal or suicidal ideation. Thought content does not include homicidal or suicidal plan.        Cognition and Memory: Cognition and memory normal.        Judgment: Judgment normal.     Comments: Insight intact     Lab Review:     Component Value Date/Time   NA 140 03/29/2024 0922   K 4.3 03/29/2024 0922   CL 103 03/29/2024 0922   CO2 19 (L) 03/29/2024 0922   GLUCOSE 98 03/29/2024 0922   GLUCOSE 104 (H) 01/26/2024 1703   BUN 11 03/29/2024 0922   CREATININE 1.09 03/29/2024 0922   CALCIUM  9.6 03/29/2024 0922   PROT 7.1 03/29/2024 0922   ALBUMIN 4.7 03/29/2024 0922   AST 24 03/29/2024 0922   ALT  47 (H) 03/29/2024 0922   ALKPHOS 128 (H) 03/29/2024 0922   BILITOT 0.4 03/29/2024 0922   GFRNONAA >60 01/26/2024 1703   GFRAA >60 06/23/2020 0620        Component Value Date/Time   WBC 11.5 (H) 03/29/2024 0922   WBC 10.9 (H) 01/26/2024 1703   RBC 5.15 03/29/2024 0922   RBC 5.66 01/26/2024 1703   HGB 15.6 03/29/2024 0922   HCT 47.5 03/29/2024 0922   PLT 290 03/29/2024 0922   MCV 92 03/29/2024 0922   MCH 30.3 03/29/2024 0922   MCH 30.4 01/26/2024 1703   MCHC 32.8 03/29/2024 0922   MCHC 33.5 01/26/2024 1703   RDW 13.4 03/29/2024 0922   LYMPHSABS 2.0 03/29/2024 0922   MONOABS 0.7 08/10/2023 0844   EOSABS 0.2 03/29/2024 0922   BASOSABS 0.1 03/29/2024 0922    No results found for: POCLITH, LITHIUM    No results found for: PHENYTOIN, PHENOBARB, VALPROATE, CBMZ   .res Assessment: Plan:    Plan:  Reports BP 118/84 - has been checking regularly.  Next neurology appointment - 06/05/2024  Abilify  20mg  daily to target mood instability.  Lithium  150mg  at bedtime - to target passive SI.  Wellbutrin  XL 300mg  daily - denies seizure history. Buspar  10mg  TID Adderall 30mg  daily  ADHD outside testing - tested 67 - adult ADHD. Consider referral for psychological testing.  RTC 3/4 weeks  25 minutes spent dedicated to the care of this patient on the date of this encounter to include pre-visit review of records, ordering of medication, post visit documentation, and face-to-face time with the patient discussing GAD, insomnia, ADHD, and BPD 1. Discussed continuing current medication regimen.  Patient remains totally disabled and unable to work currently 03/13/2024 through 05/13/2024.  Discussed potential metabolic side effects associated with atypical antipsychotics, as well as potential risk for movement side effects. Advised pt to contact office if movement side effects occur.   Discussed potential benefits, risk, and side effects of benzodiazepines to include potential risk of tolerance and dependence, as well as possible drowsiness.  Advised patient not to drive if experiencing drowsiness and to take lowest possible  effective dose to minimize risk of dependence and tolerance.   Discussed potential benefits, risks, and side effects of stimulants with patient to include increased heart rate, palpitations, insomnia, increased anxiety, increased irritability, or decreased appetite.  Instructed patient to contact office if experiencing any significant tolerability issues.   There are no diagnoses linked to this encounter.   Please see After Visit Summary for patient specific instructions.  Future Appointments  Date Time Provider Department Center  04/16/2024  8:30 AM Quantavius Humm Nattalie, NP CP-CP None  04/23/2024  9:30 AM [facility] PHARMACIST [facility]  04/30/2024 11:00 AM Zarwolo, Gloria, FNP [facility]  10/29/2024  2:45 PM McCue, Harlene, NP [facility] None    No orders of the defined types were placed in this encounter.     -------------------------------

## 2024-04-18 NOTE — Telephone Encounter (Signed)
 Patient picked up form

## 2024-04-22 NOTE — Progress Notes (Unsigned)
 Office Visit    Patient Name: Jay Montgomery Date of Encounter: 04/23/2024  Primary Care Provider:  Zarwolo, Gloria, FNP Primary Cardiologist:  Jay Schilling, MD  Chief Complaint    Hypertension - Advanced hypertension clinic  Past Medical History   HLD 7/25 LDL 143, (baseline > 190); on rosuvastatin  10  Depression/anxiety On lithium  - watch closely for drug interactions  tachycardia HR 100-120, even prior to starting Adderall       Allergies  Allergen Reactions   Hydroxyzine  Other (See Comments)    Dizziness, mental fogginess, nausea   Hydrocodone -Acetaminophen  Hives, Itching and Rash    History of Present Illness    Jay Montgomery is a 38 y.o. male patient who was referred to the [facility] by Jay Zarwolo NP.   He was first diagnosed in November 2024 with elevated diastolic readings, although systolic mostly in the 130's.    He was seen in June by Jay Montgomery and subsequent labs showed no signs of hyperaldosteronism, but did have an elevated norepinephrine level.  A follow up 24 hr urine showed normal results.  He did have low Vitamin D  at 15, this is now supplemented with weekly replacement.     Today he is in the office for follow up.  Has not had any concerns in taking the nifedipine  or carvedilol , but is not seeing any drop in the diastolic readings as of yet.  Notes that yesterday was in the 150/110 range for most of the day, but had some family stressors that he believed were impacting.  Most other diastolic readings were in the high 90's.    Blood Pressure Goal:  130/80  Current Medications: nifedipine  xl 60 daily, carvedilol  6.25 mg bid  Adherence Assessment  Do you ever forget to take your medication? [] Yes [x] No  Do you ever skip doses due to side effects? [] Yes [x] No  Do you have trouble affording your medicines? [] Yes [x] No  Are you ever unable to pick up your medication due to transportation difficulties? [] Yes [x] No    Adherence strategy: none  Previously tried:  verapamil  - hypotension   Family Hx:  father recently diagnosed with hypertension, mother deceased, early hypertension, died COPD; sister with hole in heart ; kids 9,16  Social Hx:      Tobacco: vape daily  Alcohol: no  Caffeine: sweet tea daily    Diet:    more water than other drinks; mix of home cooked and take out; salads (ham, cheese, lettuce, tomato) for lunch; no breakfast; dinner - variety of proteins, vegetables are corn and green beans (canned low sodium), tomatoes  Exercise: limited by back pain;   Home BP readings:   on his phone, systolic ranged from 118-141, with only 2 readings (of 10-12) over 130.  Diastolic were all elevated, lowest was 92, highest 114  Drug-drug interaction:  lithium  vs spironolactone  - The specific mechanism of this possible interaction is not clear, but any increase in lithium  concentrations may possibly be related to urinary sodium loss caused by the diuretic with subsequent changes in lithium  excretion. Any decrease in lithium  concentrations may result from impaired lithium  reabsorption.   Accessory Clinical Findings    Lab Results  Component Value Date   CREATININE 1.09 03/29/2024   BUN 11 03/29/2024   NA 140 03/29/2024   K 4.3 03/29/2024   CL 103 03/29/2024   CO2 19 (L) 03/29/2024   Lab Results  Component Value Date   ALT 47 (H) 03/29/2024  AST 24 03/29/2024   ALKPHOS 128 (H) 03/29/2024   BILITOT 0.4 03/29/2024   Lab Results  Component Value Date   HGBA1C 5.4 03/29/2024    Screening for Secondary Hypertension:      Relevant Labs/Studies:    Latest Ref Rng & Units 03/29/2024    9:22 AM 03/15/2024    9:36 AM 02/09/2024    2:13 PM  Basic Labs  Sodium 134 - 144 mmol/L 140  137  138   Potassium 3.5 - 5.2 mmol/L 4.3  4.7  4.6   Creatinine 0.76 - 1.27 mg/dL 8.90  8.93  8.92        Latest Ref Rng & Units 03/29/2024    9:22 AM 03/15/2024    9:36 AM  Thyroid    TSH 0.450 - 4.500  uIU/mL 1.670  1.220        Latest Ref Rng & Units 03/15/2024    9:36 AM  Renin/Aldosterone   Aldosterone 0.0 - 30.0 ng/dL 63.8   Aldos/Renin Ratio 0.0 - 30.0 6.5        Latest Ref Rng & Units 03/15/2024    9:36 AM  Metanephrines/Catecholamines   Epinephrine 0.0 - 55.4 pg/mL 43.2   Norepinephrine 115 - 524 pg/mL 834   Dopamine 0.0 - 36.7 pg/mL 18.1   Metanephrines 0.0 - 88.0 pg/mL <25.0   Normetanephrines  0.0 - 210.1 pg/mL 123.4           Home Medications    Current Outpatient Medications  Medication Sig Dispense Refill   amphetamine -dextroamphetamine  (ADDERALL) 30 MG tablet Take one tablet daily. 30 tablet 0   ARIPiprazole  (ABILIFY ) 10 MG tablet Take 1 tablet (10 mg total) by mouth 2 (two) times daily. 60 tablet 2   buPROPion  (WELLBUTRIN  XL) 150 MG 24 hr tablet Take two tablets every morning. 60 tablet 2   busPIRone  (BUSPAR ) 10 MG tablet Take 1 tablet (10 mg total) by mouth 3 (three) times daily. 90 tablet 2   carvedilol  (COREG ) 6.25 MG tablet Take 1 tablet (6.25 mg total) by mouth 2 (two) times daily. 180 tablet 1   divalproex  (DEPAKOTE  ER) 500 MG 24 hr tablet Take 1 tablet (500 mg total) by mouth daily. 30 tablet 5   gabapentin  (NEURONTIN ) 300 MG capsule Take 1 capsule (300 mg total) by mouth at bedtime. 90 capsule 3   lithium  carbonate 150 MG capsule Take 1 capsule (150 mg total) by mouth at bedtime. 30 capsule 2   NIFEdipine  (PROCARDIA  XL) 60 MG 24 hr tablet Take 1 tablet (60 mg total) by mouth daily. 30 tablet 1   oxyCODONE  (OXY IR/ROXICODONE ) 5 MG immediate release tablet Take 5 mg by mouth every 4 (four) hours as needed.     rosuvastatin  (CRESTOR ) 10 MG tablet Take 1 tablet (10 mg total) by mouth daily. 90 tablet 3   Vitamin D , Ergocalciferol , (DRISDOL ) 1.25 MG (50000 UNIT) CAPS capsule Take 1 capsule (50,000 Units total) by mouth every 7 (seven) days. 27 capsule 1   No current facility-administered medications for this visit.     Assessment & Plan   HYPERTENSION  CONTROL Vitals:   04/23/24 0948 04/23/24 0954  BP: (!) 138/98 (!) 128/96    The patient's blood pressure is elevated above target today.  In order to address the patient's elevated BP: A new medication was prescribed today.; Blood pressure will be monitored at home to determine if medication changes need to be made.     Primary hypertension Assessment: BP is uncontrolled  in office BP 128/96 mmHg;  above diastolic goal (< 80). Tolerates nifedipine  and carvedilol  well, without any side effects Denies SOB, palpitation, chest pain, headaches,or swelling Currently on lithium , which limits medication use; avoid ACEI, ARB, thiazides Reiterated the importance of regular exercise and low salt diet   Plan:  Start taking spironolactone  12.5 mg once daily Continue taking nifedipine  xl 60 daily, carvedilol  6.25 mg bid Patient to keep record of BP readings with heart rate and report to us  at the next visit Patient to follow up with me in 1 month  Labs ordered today:  BMET (at end of month with lithium  labs)   Allean Mink PharmD CPP [facility]  [address] [place], [place] [address] [phone]

## 2024-04-23 ENCOUNTER — Other Ambulatory Visit: Payer: Self-pay | Admitting: Medical Genetics

## 2024-04-23 ENCOUNTER — Ambulatory Visit (INDEPENDENT_AMBULATORY_CARE_PROVIDER_SITE_OTHER): Admitting: Pharmacist Clinician (PhC)/ Clinical Pharmacy Specialist

## 2024-04-23 ENCOUNTER — Encounter (HOSPITAL_BASED_OUTPATIENT_CLINIC_OR_DEPARTMENT_OTHER): Payer: Self-pay | Admitting: Pharmacist Clinician (PhC)/ Clinical Pharmacy Specialist

## 2024-04-23 VITALS — BP 128/96

## 2024-04-23 DIAGNOSIS — I10 Essential (primary) hypertension: Secondary | ICD-10-CM

## 2024-04-23 MED ORDER — SPIRONOLACTONE 25 MG PO TABS: 12.5000 mg | ORAL_TABLET | Freq: Every day | ORAL | 3 refills | Status: DC

## 2024-04-23 NOTE — Assessment & Plan Note (Signed)
 Assessment: BP is uncontrolled in office BP 128/96 mmHg;  above diastolic goal (< 80). Tolerates nifedipine  and carvedilol  well, without any side effects Denies SOB, palpitation, chest pain, headaches,or swelling Currently on lithium , which limits medication use; avoid ACEI, ARB, thiazides Reiterated the importance of regular exercise and low salt diet   Plan:  Start taking spironolactone  12.5 mg once daily Continue taking nifedipine  xl 60 daily, carvedilol  6.25 mg bid Patient to keep record of BP readings with heart rate and report to us  at the next visit Patient to follow up with me in 1 month  Labs ordered today:  BMET (at end of month with lithium  labs)

## 2024-04-23 NOTE — Patient Instructions (Signed)
 Follow up appointment: September 11 at 9 am  Go to the lab in 2-3 weeks (with your other labs) to check potassium levels  Take your BP meds as follows:  START SPIRONOLACTONE  12.5 MG (1/2 TABLET) DAILY  CONTINUE WITH NIFEDIPINE  XL 60 MG ONCE DAILY  CONTINUE WITH CARVEDILOL  6.25 MG TWICE DAILY  Check your blood pressure at home daily and keep record of the readings.  Your blood pressure goal is < 130/80  To check your pressure at home you will need to:  1. Sit up in a chair, with feet flat on the floor and back supported. Do not cross your ankles or legs. 2. Rest your left arm so that the cuff is about heart level. If the cuff goes on your upper arm,  then just relax the arm on the table, arm of the chair or your lap. If you have a wrist cuff, we  suggest relaxing your wrist against your chest (think of it as Pledging the Flag with the  wrong arm).  3. Place the cuff snugly around your arm, about 1 inch above the crook of your elbow. The  cords should be inside the groove of your elbow.  4. Sit quietly, with the cuff in place, for about 5 minutes. After that 5 minutes press the power  button to start a reading. 5. Do not talk or move while the reading is taking place.  6. Record your readings on a sheet of paper. Although most cuffs have a memory, it is often  easier to see a pattern developing when the numbers are all in front of you.  7. You can repeat the reading after 1-3 minutes if it is recommended  Make sure your bladder is empty and you have not had caffeine or tobacco within the last 30 min  Always bring your blood pressure log with you to your appointments. If you have not brought your monitor in to be double checked for accuracy, please bring it to your next appointment.  You can find a list of quality blood pressure cuffs at [email or web address]  Important lifestyle changes to control high blood pressure  Intervention  Effect on the BP  Lose extra pounds and watch your  waistline Weight loss is one of the most effective lifestyle changes for controlling blood pressure. If you're overweight or obese, losing even a small amount of weight can help reduce blood pressure. Blood pressure might go down by about 1 millimeter of mercury (mm Hg) with each kilogram (about 2.2 pounds) of weight lost.  Exercise regularly As a general goal, aim for at least 30 minutes of moderate physical activity every day. Regular physical activity can lower high blood pressure by about 5 to 8 mm Hg.  Eat a healthy diet Eating a diet rich in whole grains, fruits, vegetables, and low-fat dairy products and low in saturated fat and cholesterol. A healthy diet can lower high blood pressure by up to 11 mm Hg.  Reduce salt (sodium) in your diet Even a small reduction of sodium in the diet can improve heart health and reduce high blood pressure by about 5 to 6 mm Hg.  Limit alcohol One drink equals 12 ounces of beer, 5 ounces of wine, or 1.5 ounces of 80-proof liquor.  Limiting alcohol to less than one drink a day for women or two drinks a day for men can help lower blood pressure by about 4 mm Hg.   If you have any questions or concerns please  use My Chart to send questions or call the office at [phone]

## 2024-04-27 DIAGNOSIS — M48062 Spinal stenosis, lumbar region with neurogenic claudication: Secondary | ICD-10-CM | POA: Diagnosis not present

## 2024-04-27 DIAGNOSIS — M549 Dorsalgia, unspecified: Secondary | ICD-10-CM | POA: Diagnosis not present

## 2024-04-27 DIAGNOSIS — Z6831 Body mass index (BMI) 31.0-31.9, adult: Secondary | ICD-10-CM | POA: Diagnosis not present

## 2024-04-27 DIAGNOSIS — Z79899 Other long term (current) drug therapy: Secondary | ICD-10-CM | POA: Diagnosis not present

## 2024-04-27 DIAGNOSIS — R030 Elevated blood-pressure reading, without diagnosis of hypertension: Secondary | ICD-10-CM | POA: Diagnosis not present

## 2024-04-27 DIAGNOSIS — G8929 Other chronic pain: Secondary | ICD-10-CM | POA: Diagnosis not present

## 2024-04-27 DIAGNOSIS — F411 Generalized anxiety disorder: Secondary | ICD-10-CM | POA: Diagnosis not present

## 2024-04-27 DIAGNOSIS — E6609 Other obesity due to excess calories: Secondary | ICD-10-CM | POA: Diagnosis not present

## 2024-04-30 ENCOUNTER — Ambulatory Visit: Admitting: Family Medicine

## 2024-04-30 ENCOUNTER — Other Ambulatory Visit (HOSPITAL_COMMUNITY)

## 2024-05-01 DIAGNOSIS — Z79899 Other long term (current) drug therapy: Secondary | ICD-10-CM | POA: Diagnosis not present

## 2024-05-09 ENCOUNTER — Encounter (HOSPITAL_BASED_OUTPATIENT_CLINIC_OR_DEPARTMENT_OTHER): Payer: Self-pay

## 2024-05-10 ENCOUNTER — Encounter: Payer: Self-pay | Admitting: Family Medicine

## 2024-05-10 ENCOUNTER — Ambulatory Visit: Admitting: Family Medicine

## 2024-05-10 ENCOUNTER — Other Ambulatory Visit (HOSPITAL_COMMUNITY)
Admission: RE | Admit: 2024-05-10 | Discharge: 2024-05-10 | Disposition: A | Discharge status: Home / Self Care | Payer: Self-pay | Source: Ambulatory Visit | Attending: Medical Genetics | Admitting: Medical Genetics

## 2024-05-10 VITALS — BP 142/92 | HR 136 | Ht 75.0 in | Wt 245.0 lb

## 2024-05-10 DIAGNOSIS — M48062 Spinal stenosis, lumbar region with neurogenic claudication: Secondary | ICD-10-CM

## 2024-05-10 NOTE — Progress Notes (Signed)
 Established Patient Office Visit  Subjective:  Patient ID: Jay Montgomery, male    DOB: [birth year]  Age: 38 y.o. MRN: [id number]  CC:  Chief Complaint  Patient presents with   Hypertension   Referral    [facility]    HPI Jay Montgomery is a 38 y.o. male  with a past medical history of primary hypertension and lumbar spinal stenosis, presents today for follow-up. He reports that a previous referral to neurosurgery could not be completed because he owed a balance of $2,000 at that practice. Today, he is requesting a new referral to a different neurosurgery provider, as he now has a change in insurance coverage.   Past Medical History:  Diagnosis Date   Allergy    Anxiety    Chicken pox    Depression    Headache    due to CSF leak   Heart murmur    as a child   Spinal stenosis     Past Surgical History:  Procedure Laterality Date   ADENOIDECTOMY     BACK SURGERY  08/17/2017   L4-L5   LUMBAR LAMINECTOMY/DECOMPRESSION MICRODISCECTOMY N/A 12/01/2017   Procedure: REPAIR OF CEREBROSPINAL FLUID LEAK and Placement of Lumbar Drain;  Surgeon: Louis Shove, MD;  Location: [facility] OR;  Service: Neurosurgery;  Laterality: N/A;   SPINE SURGERY  07-2017   2 back surgeries   TONSILLECTOMY      Family History  Problem Relation Age of Onset   Arthritis Mother    Hypertension Mother    COPD Mother    Emphysema Mother    Bipolar disorder Mother    Hypertension Father    Arthritis Father    Diabetes Father    Neuropathy Father    Bipolar disorder Sister    Schizophrenia Sister    ADD / ADHD Sister    Anxiety disorder Sister    Atrial fibrillation Sister     Social History   Socioeconomic History   Marital status: Legally Separated    Spouse name: [name]   Number of children: Not on file   Years of education: Not on file   Highest education level: 12th grade  Occupational History   Occupation: Designer, jewellery  Tobacco Use   Smoking status: Former    Current  packs/day: 1.00    Average packs/day: 1 pack/day for 20.0 years (20.0 ttl pk-yrs)    Types: Cigarettes   Smokeless tobacco: Never  Vaping Use   Vaping status: Some Days   Substances: Nicotine, CBD, Flavoring  Substance and Sexual Activity   Alcohol use: Not Currently    Comment: Haven't had alcohol in a year due to medication   Drug use: No    Comment: vapes w/ delta 8   Sexual activity: Yes    Partners: Female    Birth control/protection: Other-see comments    Comment: Essure  Other Topics Concern   Not on file  Social History Narrative   Pt lives with wife    Pt not working    Social Drivers of Corporate investment banker Strain: Low Risk  (03/25/2024)   Overall Financial Resource Strain (CARDIA)    Difficulty of Paying Living Expenses: Not very hard  Recent Concern: Financial Resource Strain - High Risk (01/22/2024)   Overall Financial Resource Strain (CARDIA)    Difficulty of Paying Living Expenses: Very hard  Food Insecurity: Food Insecurity Present (03/27/2024)   Hunger Vital Sign    Worried About Radiation protection practitioner of Food  in the Last Year: Sometimes true    Ran Out of Food in the Last Year: Sometimes true  Transportation Needs: No Transportation Needs (03/27/2024)   PRAPARE - Administrator, Civil Service (Medical): No    Lack of Transportation (Non-Medical): No  Physical Activity: Inactive (03/25/2024)   Exercise Vital Sign    Days of Exercise per Week: 0 days    Minutes of Exercise per Session: Not on file  Stress: Stress Concern Present (03/27/2024)   [employer] of Occupational Health - Occupational Stress Questionnaire    Feeling of Stress: Very much  Social Connections: Moderately Isolated (03/25/2024)   Social Connection and Isolation Panel    Frequency of Communication with Friends and Family: More than three times a week    Frequency of Social Gatherings with Friends and Family: Once a week    Attends Religious Services: Never    Database administrator  or Organizations: No    Attends Engineer, structural: Not on file    Marital Status: Living with partner  Intimate Partner Violence: Not At Risk (08/10/2023)   Humiliation, Afraid, Rape, and Kick questionnaire    Fear of Current or Ex-Partner: No    Emotionally Abused: No    Physically Abused: No    Sexually Abused: No    Outpatient Medications Prior to Visit  Medication Sig Dispense Refill   amphetamine -dextroamphetamine  (ADDERALL) 30 MG tablet Take one tablet daily. 30 tablet 0   ARIPiprazole  (ABILIFY ) 10 MG tablet Take 1 tablet (10 mg total) by mouth 2 (two) times daily. 60 tablet 2   buPROPion  (WELLBUTRIN  XL) 150 MG 24 hr tablet Take two tablets every morning. 60 tablet 2   busPIRone  (BUSPAR ) 10 MG tablet Take 1 tablet (10 mg total) by mouth 3 (three) times daily. 90 tablet 2   carvedilol  (COREG ) 6.25 MG tablet Take 1 tablet (6.25 mg total) by mouth 2 (two) times daily. 180 tablet 1   clonazePAM  (KLONOPIN ) 1 MG tablet Take 1 mg by mouth 4 (four) times daily as needed.     divalproex  (DEPAKOTE  ER) 500 MG 24 hr tablet Take 1 tablet (500 mg total) by mouth daily. 30 tablet 5   gabapentin  (NEURONTIN ) 300 MG capsule Take 1 capsule (300 mg total) by mouth at bedtime. 90 capsule 3   lithium  carbonate 150 MG capsule Take 1 capsule (150 mg total) by mouth at bedtime. 30 capsule 2   naloxone (NARCAN) nasal spray 4 mg/0.1 mL Place 1 spray into the nose once.     NIFEdipine  (PROCARDIA  XL) 60 MG 24 hr tablet Take 1 tablet (60 mg total) by mouth daily. 30 tablet 1   oxyCODONE  (OXY IR/ROXICODONE ) 5 MG immediate release tablet Take 5 mg by mouth every 4 (four) hours as needed.     rosuvastatin  (CRESTOR ) 10 MG tablet Take 1 tablet (10 mg total) by mouth daily. 90 tablet 3   spironolactone  (ALDACTONE ) 25 MG tablet Take 0.5 tablets (12.5 mg total) by mouth daily. 45 tablet 3   Vitamin D , Ergocalciferol , (DRISDOL ) 1.25 MG (50000 UNIT) CAPS capsule Take 1 capsule (50,000 Units total) by mouth  every 7 (seven) days. 27 capsule 1   No facility-administered medications prior to visit.    Allergies  Allergen Reactions   Hydrocodone  Itching   Hydroxyzine  Other (See Comments)    Dizziness, mental fogginess, nausea   Hydrocodone -Acetaminophen  Hives, Itching and Rash    ROS Review of Systems  Constitutional:  Negative for fatigue  and fever.  Eyes:  Negative for visual disturbance.  Respiratory:  Negative for chest tightness and shortness of breath.   Cardiovascular:  Negative for chest pain and palpitations.  Musculoskeletal:  Positive for back pain.  Neurological:  Negative for dizziness and headaches.      Objective:    Physical Exam HENT:     Head: Normocephalic.     Right Ear: External ear normal.     Left Ear: External ear normal.     Nose: No congestion or rhinorrhea.     Mouth/Throat:     Mouth: Mucous membranes are moist.  Cardiovascular:     Rate and Rhythm: Regular rhythm.     Heart sounds: No murmur heard. Pulmonary:     Effort: No respiratory distress.     Breath sounds: Normal breath sounds.  Neurological:     Mental Status: He is alert.     BP (!) 142/92   Pulse (!) 136   Ht 6' 3 (1.905 m)   Wt 245 lb (111.1 kg)   BMI 30.62 kg/m  Wt Readings from Last 3 Encounters:  05/10/24 245 lb (111.1 kg)  04/09/24 242 lb (109.8 kg)  03/29/24 238 lb (108 kg)    Lab Results  Component Value Date   TSH 1.670 03/29/2024   Lab Results  Component Value Date   WBC 11.5 (H) 03/29/2024   HGB 15.6 03/29/2024   HCT 47.5 03/29/2024   MCV 92 03/29/2024   PLT 290 03/29/2024   Lab Results  Component Value Date   NA 140 03/29/2024   K 4.3 03/29/2024   CO2 19 (L) 03/29/2024   GLUCOSE 98 03/29/2024   BUN 11 03/29/2024   CREATININE 1.09 03/29/2024   BILITOT 0.4 03/29/2024   ALKPHOS 128 (H) 03/29/2024   AST 24 03/29/2024   ALT 47 (H) 03/29/2024   PROT 7.1 03/29/2024   ALBUMIN 4.7 03/29/2024   CALCIUM  9.6 03/29/2024   ANIONGAP 11 01/26/2024   EGFR  90 03/29/2024   GFR 87.77 02/15/2019   Lab Results  Component Value Date   CHOL 212 (H) 03/29/2024   Lab Results  Component Value Date   HDL 38 (L) 03/29/2024   Lab Results  Component Value Date   LDLCALC 143 (H) 03/29/2024   Lab Results  Component Value Date   TRIG 170 (H) 03/29/2024   Lab Results  Component Value Date   CHOLHDL 5.6 (H) 03/29/2024   Lab Results  Component Value Date   HGBA1C 5.4 03/29/2024      Assessment & Plan:  Spinal stenosis of lumbar region with neurogenic claudication Assessment & Plan: Encouraged to continue current treatment regimen as is A new referral to neurosurgery has been placed given the patient's change in insurance and inability to follow through with the prior referral.    Orders: -     Ambulatory referral to Neurosurgery   Note: This chart has been completed using Engineer, civil (consulting) software, and while attempts have been made to ensure accuracy, certain words and phrases may not be transcribed as intended.   Follow-up: No follow-ups on file.   Manasseh Pittsley, FNP

## 2024-05-10 NOTE — Patient Instructions (Addendum)
 I appreciate the opportunity to provide care to you today!    Follow up:  5 months    Referrals today-  Neurosurgery     Please continue to a heart-healthy diet and increase your physical activities. Try to exercise for at least five days a week.    It was a pleasure to see you and I look forward to continuing to work together on your health and well-being. Please do not hesitate to call the office if you need care or have questions about your care.  In case of emergency, please visit the Emergency Department for urgent care, or contact our clinic at [phone] to schedule an appointment. We're here to help you!   Have a wonderful day and week. With Gratitude, Rylee Huestis MSN, FNP-BC

## 2024-05-10 NOTE — Assessment & Plan Note (Addendum)
 Encouraged to continue current treatment regimen as is A new referral to neurosurgery has been placed given the patient's change in insurance and inability to follow through with the prior referral.

## 2024-05-11 ENCOUNTER — Telehealth: Payer: Self-pay

## 2024-05-11 NOTE — Telephone Encounter (Signed)
**Note De-Identified Danamarie Minami Obfuscation** I called [facility] at [phone] and started a CPAP Titration PA over the phone with Jasmine. Case# [id number]  Per Rolin, I have faxed all clinicals required for this PA to [facility] at [phone].  I did receive confirmation that the fax sent successfully. I did notify the pt of this Bernyce Brimley his [facility] message.

## 2024-05-14 ENCOUNTER — Telehealth (INDEPENDENT_AMBULATORY_CARE_PROVIDER_SITE_OTHER): Admitting: Adult Health

## 2024-05-14 ENCOUNTER — Encounter: Payer: Self-pay | Admitting: Adult Health

## 2024-05-14 DIAGNOSIS — F411 Generalized anxiety disorder: Secondary | ICD-10-CM | POA: Diagnosis not present

## 2024-05-14 DIAGNOSIS — G4700 Insomnia, unspecified: Secondary | ICD-10-CM | POA: Diagnosis not present

## 2024-05-14 DIAGNOSIS — F909 Attention-deficit hyperactivity disorder, unspecified type: Secondary | ICD-10-CM

## 2024-05-14 DIAGNOSIS — F319 Bipolar disorder, unspecified: Secondary | ICD-10-CM | POA: Diagnosis not present

## 2024-05-14 NOTE — Progress Notes (Signed)
 Jay Montgomery [id number] 04/16/1986 38 y.o.  Virtual Visit via Video Note  I connected with pt @ on 05/14/24 at  5:00 PM EDT by a video enabled telemedicine application and verified that I am speaking with the correct person using two identifiers.   I discussed the limitations of evaluation and management by telemedicine and the availability of in person appointments. The patient expressed understanding and agreed to proceed.  I discussed the assessment and treatment plan with the patient. The patient was provided an opportunity to ask questions and all were answered. The patient agreed with the plan and demonstrated an understanding of the instructions.   The patient was advised to call back or seek an in-person evaluation if the symptoms worsen or if the condition fails to improve as anticipated.  I provided 25 minutes of non-face-to-face time during this encounter.  The patient was located at home.  The provider was located at [facility].   Jay Montgomery   Subjective:   Patient ID:  Jay Montgomery is a 38 y.o. (DOB 11-Oct-1985) male.  Chief Complaint: No chief complaint on file.   HPI Jay Montgomery presents for follow-up of follow-up of GAD, insomnia, ADHD, and BPD 1.  Working with pain management - monthly.  Describes mood today as up and down a lot". Pleasant. Flat. Denies tearfulness. Mood symptoms - reports depression. Reports low interest and motivation. Reports anxiety and irritability. Reports ongoing concerns about his physical and mental health. Reports recent panic attacks - 1 to 2 since last visit. Reports difficulties leaving the house - not leaving the house at all. Reports worry, rumination and over thinking - financial situation.  Denies recent mania. Reports mood as variable - it's up and down. Stating I feel like I'm managing - doing the best that I can. Taking medications as prescribed. Energy levels lower. Active, does not  have a regular exercise routine with physical disabilities. Enjoys some usual interests and activities. Married. Lives with wife and family. Appetite adequate. Weight gain - 242 to 245 pounds. Reports sleep is variable. Reports recent diagnosis of sleep apnea - awaiting a CPAP machine. Averages 5 to 6 hours of broken sleep. Reports daytime napping - 3 to 4 hours a day.   Focus and concentration a little better. Completing minimal tasks - reports difficulties completing self care. Unable to manage household tasks - reports his niece is living with him to help with his household and self care. Reports he is unable to work with current physical limitations and medical issues. Denies SI or HI. Denies AH or VH. Denies self harm. Denies substance use.   Review of Systems:  Review of Systems  Musculoskeletal:  Negative for gait problem.  Neurological:  Negative for tremors.  Psychiatric/Behavioral:         Please refer to HPI    Medications: I have reviewed the patient's current medications.  Current Outpatient Medications  Medication Sig Dispense Refill   amphetamine -dextroamphetamine  (ADDERALL) 30 MG tablet Take one tablet daily. 30 tablet 0   ARIPiprazole  (ABILIFY ) 10 MG tablet Take 1 tablet (10 mg total) by mouth 2 (two) times daily. 60 tablet 2   buPROPion  (WELLBUTRIN  XL) 150 MG 24 hr tablet Take two tablets every morning. 60 tablet 2   busPIRone  (BUSPAR ) 10 MG tablet Take 1 tablet (10 mg total) by mouth 3 (three) times daily. 90 tablet 2   carvedilol  (COREG ) 6.25 MG tablet Take 1 tablet (6.25 mg total) by mouth 2 (  two) times daily. 180 tablet 1   clonazePAM  (KLONOPIN ) 1 MG tablet Take 1 mg by mouth 4 (four) times daily as needed.     divalproex  (DEPAKOTE  ER) 500 MG 24 hr tablet Take 1 tablet (500 mg total) by mouth daily. 30 tablet 5   gabapentin  (NEURONTIN ) 300 MG capsule Take 1 capsule (300 mg total) by mouth at bedtime. 90 capsule 3   lithium  carbonate 150 MG capsule Take 1 capsule  (150 mg total) by mouth at bedtime. 30 capsule 2   naloxone (NARCAN) nasal spray 4 mg/0.1 mL Place 1 spray into the nose once.     NIFEdipine  (PROCARDIA  XL) 60 MG 24 hr tablet Take 1 tablet (60 mg total) by mouth daily. 30 tablet 1   oxyCODONE  (OXY IR/ROXICODONE ) 5 MG immediate release tablet Take 5 mg by mouth every 4 (four) hours as needed.     rosuvastatin  (CRESTOR ) 10 MG tablet Take 1 tablet (10 mg total) by mouth daily. 90 tablet 3   spironolactone  (ALDACTONE ) 25 MG tablet Take 0.5 tablets (12.5 mg total) by mouth daily. 45 tablet 3   Vitamin D , Ergocalciferol , (DRISDOL ) 1.25 MG (50000 UNIT) CAPS capsule Take 1 capsule (50,000 Units total) by mouth every 7 (seven) days. 27 capsule 1   No current facility-administered medications for this visit.    Medication Side Effects: None  Allergies:  Allergies  Allergen Reactions   Hydrocodone  Itching   Hydroxyzine  Other (See Comments)    Dizziness, mental fogginess, nausea   Hydrocodone -Acetaminophen  Hives, Itching and Rash    Past Medical History:  Diagnosis Date   Allergy    Anxiety    Chicken pox    Depression    Headache    due to CSF leak   Heart murmur    as a child   Spinal stenosis     Family History  Problem Relation Age of Onset   Arthritis Mother    Hypertension Mother    COPD Mother    Emphysema Mother    Bipolar disorder Mother    Hypertension Father    Arthritis Father    Diabetes Father    Neuropathy Father    Bipolar disorder Sister    Schizophrenia Sister    ADD / ADHD Sister    Anxiety disorder Sister    Atrial fibrillation Sister     Social History   Socioeconomic History   Marital status: Legally Separated    Spouse name: [name]   Number of children: Not on file   Years of education: Not on file   Highest education level: 12th grade  Occupational History   Occupation: Designer, jewellery  Tobacco Use   Smoking status: Former    Current packs/day: 1.00    Average packs/day: 1 pack/day  for 20.0 years (20.0 ttl pk-yrs)    Types: Cigarettes   Smokeless tobacco: Never  Vaping Use   Vaping status: Some Days   Substances: Nicotine, CBD, Flavoring  Substance and Sexual Activity   Alcohol use: Not Currently    Comment: Haven't had alcohol in a year due to medication   Drug use: No    Comment: vapes w/ delta 8   Sexual activity: Yes    Partners: Female    Birth control/protection: Other-see comments    Comment: Essure  Other Topics Concern   Not on file  Social History Narrative   Pt lives with wife    Pt not working    Social Drivers of Health  Financial Resource Strain: Low Risk  (03/25/2024)   Overall Financial Resource Strain (CARDIA)    Difficulty of Paying Living Expenses: Not very hard  Recent Concern: Financial Resource Strain - High Risk (01/22/2024)   Overall Financial Resource Strain (CARDIA)    Difficulty of Paying Living Expenses: Very hard  Food Insecurity: Food Insecurity Present (03/27/2024)   Hunger Vital Sign    Worried About Running Out of Food in the Last Year: Sometimes true    Ran Out of Food in the Last Year: Sometimes true  Transportation Needs: No Transportation Needs (03/27/2024)   PRAPARE - Administrator, Civil Service (Medical): No    Lack of Transportation (Non-Medical): No  Physical Activity: Inactive (03/25/2024)   Exercise Vital Sign    Days of Exercise per Week: 0 days    Minutes of Exercise per Session: Not on file  Stress: Stress Concern Present (03/27/2024)   [employer] of Occupational Health - Occupational Stress Questionnaire    Feeling of Stress: Very much  Social Connections: Moderately Isolated (03/25/2024)   Social Connection and Isolation Panel    Frequency of Communication with Friends and Family: More than three times a week    Frequency of Social Gatherings with Friends and Family: Once a week    Attends Religious Services: Never    Database administrator or Organizations: No    Attends Museum/gallery exhibitions officer: Not on file    Marital Status: Living with partner  Intimate Partner Violence: Not At Risk (08/10/2023)   Humiliation, Afraid, Rape, and Kick questionnaire    Fear of Current or Ex-Partner: No    Emotionally Abused: No    Physically Abused: No    Sexually Abused: No    Past Medical History, Surgical history, Social history, and Family history were reviewed and updated as appropriate.   Please see review of systems for further details on the patient's review from today.   Objective:   Physical Exam:  There were no vitals taken for this visit.  Physical Exam Constitutional:      General: He is not in acute distress. Musculoskeletal:        General: No deformity.  Neurological:     Mental Status: He is alert and oriented to person, place, and time.     Coordination: Coordination normal.  Psychiatric:        Attention and Perception: Perception normal. He is inattentive. He does not perceive auditory or visual hallucinations.        Mood and Affect: Mood is anxious and depressed. Affect is flat. Affect is not labile, blunt, angry or inappropriate.        Speech: Speech normal.        Behavior: Behavior is agitated and withdrawn.        Thought Content: Thought content normal. Thought content is not paranoid or delusional. Thought content does not include homicidal or suicidal ideation. Thought content does not include homicidal or suicidal plan.        Cognition and Memory: Cognition and memory normal.        Judgment: Judgment normal.     Comments: Insight intact     Lab Review:     Component Value Date/Time   NA 140 03/29/2024 0922   K 4.3 03/29/2024 0922   CL 103 03/29/2024 0922   CO2 19 (L) 03/29/2024 0922   GLUCOSE 98 03/29/2024 0922   GLUCOSE 104 (H) 01/26/2024 1703   BUN 11 03/29/2024  9077   CREATININE 1.09 03/29/2024 0922   CALCIUM  9.6 03/29/2024 0922   PROT 7.1 03/29/2024 0922   ALBUMIN 4.7 03/29/2024 0922   AST 24 03/29/2024 0922    ALT 47 (H) 03/29/2024 0922   ALKPHOS 128 (H) 03/29/2024 0922   BILITOT 0.4 03/29/2024 0922   GFRNONAA >60 01/26/2024 1703   GFRAA >60 06/23/2020 0620       Component Value Date/Time   WBC 11.5 (H) 03/29/2024 0922   WBC 10.9 (H) 01/26/2024 1703   RBC 5.15 03/29/2024 0922   RBC 5.66 01/26/2024 1703   HGB 15.6 03/29/2024 0922   HCT 47.5 03/29/2024 0922   PLT 290 03/29/2024 0922   MCV 92 03/29/2024 0922   MCH 30.3 03/29/2024 0922   MCH 30.4 01/26/2024 1703   MCHC 32.8 03/29/2024 0922   MCHC 33.5 01/26/2024 1703   RDW 13.4 03/29/2024 0922   LYMPHSABS 2.0 03/29/2024 0922   MONOABS 0.7 08/10/2023 0844   EOSABS 0.2 03/29/2024 0922   BASOSABS 0.1 03/29/2024 0922    No results found for: POCLITH, LITHIUM    No results found for: PHENYTOIN, PHENOBARB, VALPROATE, CBMZ   .res Assessment: Plan:    Plan:  Monitoring BP  Next neurology appointment - 06/05/2024  Abilify  20mg  daily to target mood instability.  Lithium  150mg  at bedtime - to target passive SI.  Wellbutrin  XL 300mg  daily - denies seizure history. Buspar  10mg  TID Adderall 30mg  daily  ADHD outside testing - tested 102 - adult ADHD. Consider referral for psychological testing.  RTC 3/4 weeks  25 minutes spent dedicated to the care of this patient on the date of this encounter to include pre-visit review of records, ordering of medication, post visit documentation, and face-to-face time with the patient discussing GAD, insomnia, ADHD, and BPD 1. Discussed continuing current medication regimen.  Patient remains totally disabled and unable to work currently 03/13/2024 through 05/13/2024.  Discussed potential metabolic side effects associated with atypical antipsychotics, as well as potential risk for movement side effects. Advised pt to contact office if movement side effects occur.   Discussed potential benefits, risk, and side effects of benzodiazepines to include potential risk of tolerance and dependence,  as well as possible drowsiness.  Advised patient not to drive if experiencing drowsiness and to take lowest possible effective dose to minimize risk of dependence and tolerance.   Discussed potential benefits, risks, and side effects of stimulants with patient to include increased heart rate, palpitations, insomnia, increased anxiety, increased irritability, or decreased appetite.  Instructed patient to contact office if experiencing any significant tolerability issues.   There are no diagnoses linked to this encounter.   Please see After Visit Summary for patient specific instructions.  Future Appointments  Date Time Provider Department Center  05/14/2024  5:00 PM Lashaunta Sicard, [place], [place] CP-CP None  05/31/2024  9:00 AM Alvstad, Kristin L, RPH-CPP [facility]  10/10/2024  8:40 AM Zarwolo, Gloria, FNP [facility]  10/29/2024  2:45 PM McCue, Harlene, [place] [facility] None    No orders of the defined types were placed in this encounter.     -------------------------------

## 2024-05-18 NOTE — Telephone Encounter (Addendum)
**Note De-Identified Jay Montgomery Obfuscation** Letter received from [facility] M[facility]ander fax stating that the pt does not have contraindication to APAP and are recommending that he have an APAP instead of a CPAP Titration.  Forwarding to Dr Shlomo for her advisement.

## 2024-05-20 LAB — GENECONNECT MOLECULAR SCREEN: Genetic Analysis Overall Interpretation: NEGATIVE

## 2024-05-22 NOTE — Telephone Encounter (Signed)
**Note De-Identified [name] Obfuscation** Another letter received from [facility] [name] fax but this one is stating that they have approved the pts CPAP Titration from 06/18/2024-08/16/2024. PA# [id number]  I have transferred the order to the sleep lab and I called the pt to make him aware of this approval and I provided him with the [facility] phone number so he can call them to schedule his CPAP Titration. The pt verbalized understanding and thanked me for my call.

## 2024-05-24 ENCOUNTER — Ambulatory Visit: Admitting: Family Medicine

## 2024-05-25 DIAGNOSIS — Z79899 Other long term (current) drug therapy: Secondary | ICD-10-CM | POA: Diagnosis not present

## 2024-05-25 DIAGNOSIS — E6609 Other obesity due to excess calories: Secondary | ICD-10-CM | POA: Diagnosis not present

## 2024-05-25 DIAGNOSIS — R030 Elevated blood-pressure reading, without diagnosis of hypertension: Secondary | ICD-10-CM | POA: Diagnosis not present

## 2024-05-25 DIAGNOSIS — M48062 Spinal stenosis, lumbar region with neurogenic claudication: Secondary | ICD-10-CM | POA: Diagnosis not present

## 2024-05-25 DIAGNOSIS — Z6831 Body mass index (BMI) 31.0-31.9, adult: Secondary | ICD-10-CM | POA: Diagnosis not present

## 2024-05-25 DIAGNOSIS — M549 Dorsalgia, unspecified: Secondary | ICD-10-CM | POA: Diagnosis not present

## 2024-05-25 DIAGNOSIS — G8929 Other chronic pain: Secondary | ICD-10-CM | POA: Diagnosis not present

## 2024-05-25 DIAGNOSIS — F411 Generalized anxiety disorder: Secondary | ICD-10-CM | POA: Diagnosis not present

## 2024-05-30 DIAGNOSIS — Z79899 Other long term (current) drug therapy: Secondary | ICD-10-CM | POA: Diagnosis not present

## 2024-05-30 NOTE — Progress Notes (Signed)
 Office Visit    Patient Name: Jay Montgomery Date of Encounter: 05/31/2024  Primary Care Provider:  Zarwolo, Gloria, FNP Primary Cardiologist:  Lynwood Schilling, MD  Chief Complaint    Hypertension - Advanced hypertension clinic  Past Medical History   HLD 7/25 LDL 143, (baseline > 190); on rosuvastatin  10  Depression/anxiety On lithium  - watch closely for drug interactions  tachycardia HR 100-120, even prior to starting Adderall       Allergies  Allergen Reactions   Hydrocodone  Itching   Hydroxyzine  Other (See Comments)    Dizziness, mental fogginess, nausea   Hydrocodone -Acetaminophen  Hives, Itching and Rash    History of Present Illness    Jay Montgomery is a 38 y.o. male patient who was referred to the [facility] by Gloria Zarwolo NP.   He was first diagnosed in November 2024 with elevated diastolic readings, although systolic mostly in the 130's.    He was seen in June by Reche walker and subsequent labs showed no signs of hyperaldosteronism, but did have an elevated norepinephrine level.  A follow up 24 hr urine showed normal results.  He did have low Vitamin D  at 15, this is now supplemented with weekly replacement.   When I saw him in August, he had not seen much drop in diastolic readings.  I added spironolactone  12.5 mg.  Today he is in the office for follow up.  Had better readings since adding spironolactone , although he has not taken today's doses.   He notes that morning medications are usually aour 9-10 am, and evening meds around 6 pm.   Blood Pressure Goal:  130/80  Current Medications: nifedipine  xl 60 daily, carvedilol  6.25 mg bid, spironolactone  12.5 mg daily  Adherence Assessment  Do you ever forget to take your medication? [] Yes [x] No  Do you ever skip doses due to side effects? [] Yes [x] No  Do you have trouble affording your medicines? [] Yes [x] No  Are you ever unable to pick up your medication due to transportation  difficulties? [] Yes [x] No   Adherence strategy: none  Previously tried:  verapamil  - hypotension   Family Hx:  father recently diagnosed with hypertension, mother deceased, early hypertension, died COPD; sister with hole in heart ; kids 9,16  Social Hx:      Tobacco: vape daily  Alcohol: no  Caffeine: sweet tea daily    Diet:    more water than other drinks; now eating less takeout and more salads (ham, cheese, lettuce, tomato) for lunch; no breakfast; dinner - variety of proteins, vegetables are corn and green beans (canned low sodium), tomatoes; notes with pain he tends to eat only once per daily  Exercise: limited by back pain;   Home BP readings:   mostly 138-140, nothing at 150 since adding spironolactone ; diastolic now always < 100   Drug-drug interaction:  lithium  vs spironolactone  - The specific mechanism of this possible interaction is not clear, but any increase in lithium  concentrations may possibly be related to urinary sodium loss caused by the diuretic with subsequent changes in lithium  excretion. Any decrease in lithium  concentrations may result from impaired lithium  reabsorption.   Accessory Clinical Findings    Lab Results  Component Value Date   CREATININE 1.09 03/29/2024   BUN 11 03/29/2024   NA 140 03/29/2024   K 4.3 03/29/2024   CL 103 03/29/2024   CO2 19 (L) 03/29/2024   Lab Results  Component Value Date   ALT 47 (H) 03/29/2024  AST 24 03/29/2024   ALKPHOS 128 (H) 03/29/2024   BILITOT 0.4 03/29/2024   Lab Results  Component Value Date   HGBA1C 5.4 03/29/2024    Screening for Secondary Hypertension:      Relevant Labs/Studies:    Latest Ref Rng & Units 03/29/2024    9:22 AM 03/15/2024    9:36 AM 02/09/2024    2:13 PM  Basic Labs  Sodium 134 - 144 mmol/L 140  137  138   Potassium 3.5 - 5.2 mmol/L 4.3  4.7  4.6   Creatinine 0.76 - 1.27 mg/dL 8.90  8.93  8.92        Latest Ref Rng & Units 03/29/2024    9:22 AM 03/15/2024    9:36 AM   Thyroid    TSH 0.450 - 4.500 uIU/mL 1.670  1.220        Latest Ref Rng & Units 03/15/2024    9:36 AM  Renin/Aldosterone   Aldosterone 0.0 - 30.0 ng/dL 63.8   Aldos/Renin Ratio 0.0 - 30.0 6.5        Latest Ref Rng & Units 03/15/2024    9:36 AM  Metanephrines/Catecholamines   Epinephrine 0.0 - 55.4 pg/mL 43.2   Norepinephrine 115 - 524 pg/mL 834   Dopamine 0.0 - 36.7 pg/mL 18.1   Metanephrines 0.0 - 88.0 pg/mL <25.0   Normetanephrines  0.0 - 210.1 pg/mL 123.4           Home Medications    Current Outpatient Medications  Medication Sig Dispense Refill   NIFEdipine  (PROCARDIA  XL/NIFEDICAL-XL) 90 MG 24 hr tablet Take 1 tablet (90 mg total) by mouth daily. 30 tablet 6   amphetamine -dextroamphetamine  (ADDERALL) 30 MG tablet Take one tablet daily. 30 tablet 0   ARIPiprazole  (ABILIFY ) 10 MG tablet Take 1 tablet (10 mg total) by mouth 2 (two) times daily. 60 tablet 2   buPROPion  (WELLBUTRIN  XL) 150 MG 24 hr tablet Take two tablets every morning. 60 tablet 2   busPIRone  (BUSPAR ) 10 MG tablet Take 1 tablet (10 mg total) by mouth 3 (three) times daily. 90 tablet 2   carvedilol  (COREG ) 6.25 MG tablet Take 1 tablet (6.25 mg total) by mouth 2 (two) times daily. 180 tablet 1   clonazePAM  (KLONOPIN ) 1 MG tablet Take 1 mg by mouth 4 (four) times daily as needed.     divalproex  (DEPAKOTE  ER) 500 MG 24 hr tablet Take 1 tablet (500 mg total) by mouth daily. 30 tablet 5   gabapentin  (NEURONTIN ) 300 MG capsule Take 1 capsule (300 mg total) by mouth at bedtime. 90 capsule 3   lithium  carbonate 150 MG capsule Take 1 capsule (150 mg total) by mouth at bedtime. 30 capsule 2   naloxone (NARCAN) nasal spray 4 mg/0.1 mL Place 1 spray into the nose once.     oxyCODONE  (OXY IR/ROXICODONE ) 5 MG immediate release tablet Take 5 mg by mouth every 4 (four) hours as needed.     rosuvastatin  (CRESTOR ) 10 MG tablet Take 1 tablet (10 mg total) by mouth daily. 90 tablet 3   spironolactone  (ALDACTONE ) 25 MG tablet Take  0.5 tablets (12.5 mg total) by mouth daily. 45 tablet 3   Vitamin D , Ergocalciferol , (DRISDOL ) 1.25 MG (50000 UNIT) CAPS capsule Take 1 capsule (50,000 Units total) by mouth every 7 (seven) days. 27 capsule 1   No current facility-administered medications for this visit.     Assessment & Plan   HYPERTENSION CONTROL Vitals:   05/31/24 0848 05/31/24 0901  BP: ROLLEN)  146/90 (!) 132/90    The patient's blood pressure is elevated above target today.  In order to address the patient's elevated BP:      Primary hypertension Assessment: BP is uncontrolled in office BP 132/90 mmHg;  above the goal (<130/80). Tolerates current medications well, without any side effects Denies SOB, palpitation, chest pain, headaches,or swelling States pain level higher today, has chronic neck/back pain Reiterated the importance of regular exercise and low salt diet   Plan:  Increase nifedipine  to 90 mg once daily Continue taking carvedilol  6.25 mg twice daily and spironolactone  12.5 mg daily Patient to keep record of BP readings with heart rate and report to us  at the next visit Patient to follow up with me in 1 month  Labs ordered today:  BMET today -did not get after last visit.   Bradd Merlos PharmD CPP [facility]  [address] [place], [place] [address] [phone]

## 2024-05-31 ENCOUNTER — Ambulatory Visit (HOSPITAL_BASED_OUTPATIENT_CLINIC_OR_DEPARTMENT_OTHER): Admitting: Pharmacist Clinician (PhC)/ Clinical Pharmacy Specialist

## 2024-05-31 ENCOUNTER — Encounter (HOSPITAL_BASED_OUTPATIENT_CLINIC_OR_DEPARTMENT_OTHER): Payer: Self-pay | Admitting: Pharmacist Clinician (PhC)/ Clinical Pharmacy Specialist

## 2024-05-31 ENCOUNTER — Other Ambulatory Visit: Payer: Self-pay | Admitting: Family Medicine

## 2024-05-31 ENCOUNTER — Inpatient Hospital Stay
Admission: RE | Admit: 2024-05-31 | Discharge: 2024-05-31 | Disposition: A | Discharge status: Home / Self Care | Payer: Self-pay | Source: Ambulatory Visit | Attending: Orthopedic Surgery | Admitting: Orthopedic Surgery

## 2024-05-31 VITALS — BP 132/90 | Wt 243.4 lb

## 2024-05-31 DIAGNOSIS — Z049 Encounter for examination and observation for unspecified reason: Secondary | ICD-10-CM

## 2024-05-31 DIAGNOSIS — I10 Essential (primary) hypertension: Secondary | ICD-10-CM | POA: Diagnosis not present

## 2024-05-31 LAB — BASIC METABOLIC PANEL WITH GFR
BUN/Creatinine Ratio: 10 (ref 9–20)
BUN: 12 mg/dL (ref 6–20)
CO2: 19 mmol/L — ABNORMAL LOW (ref 20–29)
Calcium: 9.2 mg/dL (ref 8.7–10.2)
Chloride: 100 mmol/L (ref 96–106)
Creatinine, Ser: 1.17 mg/dL (ref 0.76–1.27)
Glucose: 109 mg/dL — ABNORMAL HIGH (ref 70–99)
Potassium: 4.3 mmol/L (ref 3.5–5.2)
Sodium: 138 mmol/L (ref 134–144)
eGFR: 83 mL/min/1.73 (ref >59)

## 2024-05-31 MED ORDER — NIFEDIPINE ER OSMOTIC RELEASE 90 MG PO TB24: 90.0000 mg | ORAL_TABLET | Freq: Every day | ORAL | 6 refills | Status: DC

## 2024-05-31 NOTE — Assessment & Plan Note (Signed)
 Assessment: BP is uncontrolled in office BP 132/90 mmHg;  above the goal (<130/80). Tolerates current medications well, without any side effects Denies SOB, palpitation, chest pain, headaches,or swelling States pain level higher today, has chronic neck/back pain Reiterated the importance of regular exercise and low salt diet   Plan:  Increase nifedipine  to 90 mg once daily Continue taking carvedilol  6.25 mg twice daily and spironolactone  12.5 mg daily Patient to keep record of BP readings with heart rate and report to us  at the next visit Patient to follow up with me in 1 month  Labs ordered today:  BMET today -did not get after last visit.

## 2024-05-31 NOTE — Patient Instructions (Signed)
 Follow up appointment: Monday October 20 at 1:30 pm  Go to the lab today to check kidney function  Take your BP meds as follows: INCREASE NIFEDIPINE  TO 90 MG ONCE DAILY  Check your blood pressure at home daily and keep record of the readings.  Your blood pressure goal is < 130/80  To check your pressure at home you will need to:  1. Sit up in a chair, with feet flat on the floor and back supported. Do not cross your ankles or legs. 2. Rest your left arm so that the cuff is about heart level. If the cuff goes on your upper arm,  then just relax the arm on the table, arm of the chair or your lap. If you have a wrist cuff, we  suggest relaxing your wrist against your chest (think of it as Pledging the Flag with the  wrong arm).  3. Place the cuff snugly around your arm, about 1 inch above the crook of your elbow. The  cords should be inside the groove of your elbow.  4. Sit quietly, with the cuff in place, for about 5 minutes. After that 5 minutes press the power  button to start a reading. 5. Do not talk or move while the reading is taking place.  6. Record your readings on a sheet of paper. Although most cuffs have a memory, it is often  easier to see a pattern developing when the numbers are all in front of you.  7. You can repeat the reading after 1-3 minutes if it is recommended  Make sure your bladder is empty and you have not had caffeine or tobacco within the last 30 min  Always bring your blood pressure log with you to your appointments. If you have not brought your monitor in to be double checked for accuracy, please bring it to your next appointment.  You can find a list of quality blood pressure cuffs at [email or web address]  Important lifestyle changes to control high blood pressure  Intervention  Effect on the BP  Lose extra pounds and watch your waistline Weight loss is one of the most effective lifestyle changes for controlling blood pressure. If you're overweight or obese,  losing even a small amount of weight can help reduce blood pressure. Blood pressure might go down by about 1 millimeter of mercury (mm Hg) with each kilogram (about 2.2 pounds) of weight lost.  Exercise regularly As a general goal, aim for at least 30 minutes of moderate physical activity every day. Regular physical activity can lower high blood pressure by about 5 to 8 mm Hg.  Eat a healthy diet Eating a diet rich in whole grains, fruits, vegetables, and low-fat dairy products and low in saturated fat and cholesterol. A healthy diet can lower high blood pressure by up to 11 mm Hg.  Reduce salt (sodium) in your diet Even a small reduction of sodium in the diet can improve heart health and reduce high blood pressure by about 5 to 6 mm Hg.  Limit alcohol One drink equals 12 ounces of beer, 5 ounces of wine, or 1.5 ounces of 80-proof liquor.  Limiting alcohol to less than one drink a day for women or two drinks a day for men can help lower blood pressure by about 4 mm Hg.   If you have any questions or concerns please use My Chart to send questions or call the office at [phone]

## 2024-06-01 ENCOUNTER — Ambulatory Visit: Payer: Self-pay | Admitting: Pharmacist Clinician (PhC)/ Clinical Pharmacy Specialist

## 2024-06-05 ENCOUNTER — Ambulatory Visit: Payer: Medicaid Other | Admitting: Neurology

## 2024-06-14 NOTE — Progress Notes (Signed)
 Referring Physician:  Edman Meade PEDLAR, FNP [address] [place],  [place] [address]  Primary Physician:  Edman Meade PEDLAR, FNP  History of Present Illness: 06/19/2024 [name] has a history of cerebral vasoconstriction syndrome, HTN, GAD, tachycardia, hyperlipidemia, depression, ADHD, bipolar, OCD, PTSD.   History of L4-L5 microdiscectomy with CSF leak on 12/01/17 with Dr. Louis. He had 1 previous lumbar surgeries as well. He's had chronic pain for years.   He had some improvement after surgery in 2019 for 1-2 years then pain returned.   He also has constant LBP with no leg pain. He has  weakness in his legs causing him to fall. He is using a cane to ambulate. No numbness or tingling. Pain is worse with sitting, standing, and walking. Pain is better with nothing.   He has constant neck pain with no arm pain. He has numbness and tingling in both hands. He had weakness in both hands and is dropping things. Pain is worse with being active. No alleviating factors. No dexterity issues. He thinks he has balance issues as well.    He is taking neurontin  and oxycodone . He is seeing [facility].   Tobacco use: Does not smoke. He vapes with nicotine.   Bowel/Bladder Dysfunction: none  Conservative measures:  Physical therapy: did PT for back 6 years ago  Multimodal medical therapy including regular antiinflammatories: neurontin   Injections:  Had lumbar ESIs years ago.   Past Surgery:  12/01/2017 Lumber Laminectomy/Decompression Microdiscetomy with CSF leak by Dr. Louis 1 previous lumbar surgeries.    Jay Montgomery has some symptoms of cervical myelopathy.  The symptoms are causing a significant impact on the patient's life.   Review of Systems:  A 10 point review of systems is negative, except for the pertinent positives and negatives detailed in the HPI.  Past Medical History: Past Medical History:  Diagnosis Date   Allergy    Anxiety    Chicken  pox    Depression    Headache    due to CSF leak   Heart murmur    as a child   Spinal stenosis     Past Surgical History: Past Surgical History:  Procedure Laterality Date   ADENOIDECTOMY     BACK SURGERY  08/17/2017   L4-L5   LUMBAR LAMINECTOMY/DECOMPRESSION MICRODISCECTOMY N/A 12/01/2017   Procedure: REPAIR OF CEREBROSPINAL FLUID LEAK and Placement of Lumbar Drain;  Surgeon: Louis Shove, MD;  Location: [facility] OR;  Service: Neurosurgery;  Laterality: N/A;   SPINE SURGERY  07-2017   2 back surgeries   TONSILLECTOMY      Allergies: Allergies as of 06/19/2024 - Review Complete 06/19/2024  Allergen Reaction Noted   Hydrocodone  Itching 02/27/2024   Hydroxyzine  Other (See Comments) 01/25/2019   Hydrocodone -acetaminophen  Hives, Itching, and Rash 01/23/2019    Medications: Outpatient Encounter Medications as of 06/19/2024  Medication Sig   amphetamine -dextroamphetamine  (ADDERALL) 30 MG tablet Take one tablet daily.   ARIPiprazole  (ABILIFY ) 10 MG tablet Take 1 tablet (10 mg total) by mouth 2 (two) times daily.   buPROPion  (WELLBUTRIN  XL) 150 MG 24 hr tablet Take two tablets every morning.   busPIRone  (BUSPAR ) 10 MG tablet Take 1 tablet (10 mg total) by mouth 3 (three) times daily.   carvedilol  (COREG ) 6.25 MG tablet Take 1 tablet (6.25 mg total) by mouth 2 (two) times daily.   divalproex  (DEPAKOTE  ER) 500 MG 24 hr tablet Take 1 tablet (500 mg total) by mouth daily.  gabapentin  (NEURONTIN ) 300 MG capsule Take 1 capsule (300 mg total) by mouth at bedtime.   lithium  carbonate 150 MG capsule Take 1 capsule (150 mg total) by mouth at bedtime.   naloxone (NARCAN) nasal spray 4 mg/0.1 mL Place 1 spray into the nose once.   NIFEdipine  (PROCARDIA  XL/NIFEDICAL-XL) 90 MG 24 hr tablet Take 1 tablet (90 mg total) by mouth daily.   oxyCODONE  (OXY IR/ROXICODONE ) 5 MG immediate release tablet Take 5 mg by mouth every 4 (four) hours as needed.   rosuvastatin  (CRESTOR ) 10 MG tablet Take 1 tablet (10  mg total) by mouth daily.   spironolactone  (ALDACTONE ) 25 MG tablet Take 0.5 tablets (12.5 mg total) by mouth daily.   Vitamin D , Ergocalciferol , (DRISDOL ) 1.25 MG (50000 UNIT) CAPS capsule Take 1 capsule (50,000 Units total) by mouth every 7 (seven) days.   [DISCONTINUED] amphetamine -dextroamphetamine  (ADDERALL) 30 MG tablet Take one tablet daily.   [DISCONTINUED] ARIPiprazole  (ABILIFY ) 10 MG tablet Take 1 tablet (10 mg total) by mouth 2 (two) times daily.   [DISCONTINUED] buPROPion  (WELLBUTRIN  XL) 150 MG 24 hr tablet Take two tablets every morning.   [DISCONTINUED] busPIRone  (BUSPAR ) 10 MG tablet Take 1 tablet (10 mg total) by mouth 3 (three) times daily.   [DISCONTINUED] clonazePAM  (KLONOPIN ) 1 MG tablet Take 1 mg by mouth 4 (four) times daily as needed.   [DISCONTINUED] lithium  carbonate 150 MG capsule Take 1 capsule (150 mg total) by mouth at bedtime.   No facility-administered encounter medications on file as of 06/19/2024.    Social History: Social History   Tobacco Use   Smoking status: Former    Current packs/day: 1.00    Average packs/day: 1 pack/day for 20.0 years (20.0 ttl pk-yrs)    Types: Cigarettes   Smokeless tobacco: Never  Vaping Use   Vaping status: Some Days   Substances: Nicotine, CBD, Flavoring  Substance Use Topics   Alcohol use: Not Currently    Comment: Haven't had alcohol in a year due to medication   Drug use: No    Comment: vapes w/ delta 8    Family Medical History: Family History  Problem Relation Age of Onset   Arthritis Mother    Hypertension Mother    COPD Mother    Emphysema Mother    Bipolar disorder Mother    Hypertension Father    Arthritis Father    Diabetes Father    Neuropathy Father    Bipolar disorder Sister    Schizophrenia Sister    ADD / ADHD Sister    Anxiety disorder Sister    Atrial fibrillation Sister     Physical Examination: Vitals:   06/19/24 0844 06/19/24 0924  BP: (!) 136/100 (!) 138/102    General: Patient  is well developed, well nourished, calm, collected, and in no apparent distress. Attention to examination is appropriate.  Respiratory: Patient is breathing without any difficulty.   NEUROLOGICAL:     Awake, alert, oriented to person, place, and time.  Speech is clear and fluent. Fund of knowledge is appropriate.   Cranial Nerves: Pupils equal round and reactive to light.  Facial tone is symmetric.    Well healed lumbar incision.   No abnormal lesions on exposed skin.   Strength: Side Biceps Triceps Deltoid Interossei Grip Wrist Ext. Wrist Flex.  R 5 5 5 5 5 5 5   L 5 5 5 5 5 5 5    Side Iliopsoas Quads Hamstring PF DF EHL  R 5 5 5 5  5  5  L 5 5 5 5 5 5    No gross weakness with strength testing, but he does not give a great effort with upper extremities.   Reflexes are 3+ and symmetric at the biceps, brachioradialis, patella and achilles.   Hoffman's is absent.   Clonus is not present.    Bilateral upper and lower extremity sensation is intact to light touch, but diminished bilateral ulnar aspect of forearm into hand.   No pain with IR/ER of both hips.   Gait is slow and unsteady. He ambulates with a cane.   Medical Decision Making  Imaging: Lumbar xrays dated 07/12/23:  FINDINGS: Slight motion artifact. There appears to be minimal retrolisthesis of L4 on L5. Visualized vertebral body heights appear grossly intact. Mild multilevel degenerative changes with small vertebral body osteophytes and disc space narrowing at multiple levels, most pronounced at L1-2, L2-3, and L4-5. Mild multilevel hypertrophic degenerative facet arthropathy, most pronounced from L3-4 through L5-S1. Slight curvature of the thoracolumbar spine convex to the left.     IMPRESSION:  Minimal retrolisthesis of L4 on L5.  Multilevel degenerative changes in the lumbar spine.    Electronically Signed by: Donnice Mains, MD on 07/12/2023 12:14 PM   I have personally reviewed the images and agree with  the above interpretation.    Assessment and Plan: Mr. Lafavor has a history of L4-L5 microdiscectomy with CSF leak on 12/01/17 with Dr. Louis. He had 1 previous lumbar surgeries as well. He's had chronic pain for years.   He had some improvement after surgery in 2019 for 1-2 years then pain returned.   Currently, he has constant LBP with no leg pain. He has  weakness in his legs causing him to fall. He is using a cane to ambulate. No numbness or tingling.   He has known lumbar spondylosis.   He also has constant neck pain with no arm pain. He has numbness and tingling in both hands. He had weakness in both hands and is dropping things. No dexterity issues. He thinks he has balance issues as well.    No cervical imaging. He is hyper reflexic on exam and has an unsteady gait.   Treatment options discussed with patient and following plan made:   - MRI of lumbar spine to further evaluate chronic LBP.  - MRI of cervical spine to evaluate hyper reflexia, unsteady gait, and neck pain.  - Continue on oxycodone  and neurontin  from [facility].  - Depending on results of cervical MRI, may consider upper extremity EMG.  - Will schedule MyChart visit to review MRI results once I get them back.   BP was elevated. No symptoms of chest pain, shortness of breath, blurry vision, or headaches. He will recheck at home and call PCP if not improved. If he develops CP, SOB, blurry vision, or headaches, then he will go to ED.     I spent a total of 45 minutes in face-to-face and non-face-to-face activities related to this patient's care today including review of outside records, review of imaging, review of symptoms, physical exam, discussion of differential diagnosis, discussion of treatment options, and documentation.   Thank you for involving me in the care of this patient.   Jay Boys PA-C Dept. of Neurosurgery

## 2024-06-18 ENCOUNTER — Telehealth: Admitting: Adult Health

## 2024-06-18 ENCOUNTER — Encounter: Payer: Self-pay | Admitting: Family Medicine

## 2024-06-18 ENCOUNTER — Encounter: Payer: Self-pay | Admitting: Adult Health

## 2024-06-18 DIAGNOSIS — F411 Generalized anxiety disorder: Secondary | ICD-10-CM | POA: Diagnosis not present

## 2024-06-18 DIAGNOSIS — F909 Attention-deficit hyperactivity disorder, unspecified type: Secondary | ICD-10-CM

## 2024-06-18 DIAGNOSIS — F319 Bipolar disorder, unspecified: Secondary | ICD-10-CM | POA: Diagnosis not present

## 2024-06-18 DIAGNOSIS — Z79899 Other long term (current) drug therapy: Secondary | ICD-10-CM

## 2024-06-18 MED ORDER — ARIPIPRAZOLE 10 MG PO TABS: 10.0000 mg | ORAL_TABLET | Freq: Two times a day (BID) | ORAL | 2 refills | Status: AC

## 2024-06-18 MED ORDER — BUPROPION HCL ER (XL) 150 MG PO TB24: ORAL_TABLET | ORAL | 2 refills | Status: AC

## 2024-06-18 MED ORDER — AMPHETAMINE-DEXTROAMPHETAMINE 30 MG PO TABS: ORAL_TABLET | ORAL | 0 refills | Status: DC

## 2024-06-18 MED ORDER — LITHIUM CARBONATE 150 MG PO CAPS: 150.0000 mg | ORAL_CAPSULE | Freq: Every day | ORAL | 2 refills | Status: AC

## 2024-06-18 MED ORDER — BUSPIRONE HCL 10 MG PO TABS: 10.0000 mg | ORAL_TABLET | Freq: Three times a day (TID) | ORAL | 2 refills | Status: DC

## 2024-06-18 NOTE — Progress Notes (Signed)
 Jay Montgomery [id number] [birth year] 38 y.o.  Virtual Visit via Video Note  I connected with pt @ on 06/18/24 at  9:00 AM EDT by a video enabled telemedicine application and verified that I am speaking with the correct person using two identifiers.   I discussed the limitations of evaluation and management by telemedicine and the availability of in person appointments. The patient expressed understanding and agreed to proceed.  I discussed the assessment and treatment plan with the patient. The patient was provided an opportunity to ask questions and all were answered. The patient agreed with the plan and demonstrated an understanding of the instructions.   The patient was advised to call back or seek an in-person evaluation if the symptoms worsen or if the condition fails to improve as anticipated.  I provided 25 minutes of non-face-to-face time during this encounter.  The patient was located at home.  The provider was located at [facility].   Angeline LOISE Sayers, NP   Subjective:   Patient ID:  Jay Montgomery is a 38 y.o. (DOB 18-Jun-1986) male.  Chief Complaint: No chief complaint on file.   HPI Jay Montgomery presents for follow-up of GAD, insomnia, ADHD, and BPD 1.  Working with pain management - monthly.  Describes mood today as up and down a lot". Pleasant. Flat. Denies tearfulness. Mood symptoms - reports depression. Reports decreased interest and motivation - to do anything, nothing. Reports increased anxiety - a lot of things going on. Reports irritability at time - pain related. Reports ongoing concerns about his physical and mental health. Reports seeing a new Retail buyer tomorrow. Reports recent panic attacks - 2 since last visit. Reports difficulties leaving the house - I don't go anywhere - except for a doctor's appointment. Reports increased worry, rumination and over thinking - financial situation - bills piling up - things not getting  paid.  Denies recent mania. Reports mood as variable - it's up and down. Stating I feel like things could be a lot better for me. Taking medications as prescribed. Energy levels lower - I don't have the energy to do anything. Active, does not have a regular exercise routine with physical disabilities. Enjoys some usual interests and activities. Married. Lives with wife and family. Appetite adequate. Weight gain - 245 to 248 pounds. Reports sleep is variable. Reports recent diagnosis of sleep apnea - awaiting a CPAP machine - has an appointment in late October. Averages 5 to 6 hours of broken sleep. Reports 2 naps a day for about 2 hours.  Focus and concentration it could be better. Completing minimal tasks - struggles with self care. Unable to manage household tasks - has a family member staying with him to help with his household and self care. Reports he is unable to work with current physical limitations and medical issues. Denies SI or HI. Denies AH or VH. Denies self harm. Denies substance use.   Review of Systems:  Review of Systems  Musculoskeletal:  Negative for gait problem.  Neurological:  Negative for tremors.  Psychiatric/Behavioral:         Please refer to HPI    Medications: I have reviewed the patient's current medications.  Current Outpatient Medications  Medication Sig Dispense Refill   amphetamine -dextroamphetamine  (ADDERALL) 30 MG tablet Take one tablet daily. 30 tablet 0   ARIPiprazole  (ABILIFY ) 10 MG tablet Take 1 tablet (10 mg total) by mouth 2 (two) times daily. 60 tablet 2   buPROPion  (WELLBUTRIN  XL) 150 MG 24  hr tablet Take two tablets every morning. 60 tablet 2   busPIRone  (BUSPAR ) 10 MG tablet Take 1 tablet (10 mg total) by mouth 3 (three) times daily. 90 tablet 2   carvedilol  (COREG ) 6.25 MG tablet Take 1 tablet (6.25 mg total) by mouth 2 (two) times daily. 180 tablet 1   divalproex  (DEPAKOTE  ER) 500 MG 24 hr tablet Take 1 tablet (500 mg total) by  mouth daily. 30 tablet 5   gabapentin  (NEURONTIN ) 300 MG capsule Take 1 capsule (300 mg total) by mouth at bedtime. 90 capsule 3   lithium  carbonate 150 MG capsule Take 1 capsule (150 mg total) by mouth at bedtime. 30 capsule 2   naloxone (NARCAN) nasal spray 4 mg/0.1 mL Place 1 spray into the nose once.     NIFEdipine  (PROCARDIA  XL/NIFEDICAL-XL) 90 MG 24 hr tablet Take 1 tablet (90 mg total) by mouth daily. 30 tablet 6   oxyCODONE  (OXY IR/ROXICODONE ) 5 MG immediate release tablet Take 5 mg by mouth every 4 (four) hours as needed.     rosuvastatin  (CRESTOR ) 10 MG tablet Take 1 tablet (10 mg total) by mouth daily. 90 tablet 3   spironolactone  (ALDACTONE ) 25 MG tablet Take 0.5 tablets (12.5 mg total) by mouth daily. 45 tablet 3   Vitamin D , Ergocalciferol , (DRISDOL ) 1.25 MG (50000 UNIT) CAPS capsule Take 1 capsule (50,000 Units total) by mouth every 7 (seven) days. 27 capsule 1   No current facility-administered medications for this visit.    Medication Side Effects: None  Allergies:  Allergies  Allergen Reactions   Hydrocodone  Itching   Hydroxyzine  Other (See Comments)    Dizziness, mental fogginess, nausea   Hydrocodone -Acetaminophen  Hives, Itching and Rash    Past Medical History:  Diagnosis Date   Allergy    Anxiety    Chicken pox    Depression    Headache    due to CSF leak   Heart murmur    as a child   Spinal stenosis     Family History  Problem Relation Age of Onset   Arthritis Mother    Hypertension Mother    COPD Mother    Emphysema Mother    Bipolar disorder Mother    Hypertension Father    Arthritis Father    Diabetes Father    Neuropathy Father    Bipolar disorder Sister    Schizophrenia Sister    ADD / ADHD Sister    Anxiety disorder Sister    Atrial fibrillation Sister     Social History   Socioeconomic History   Marital status: Legally Separated    Spouse name: [name]   Number of children: Not on file   Years of education: Not on file    Highest education level: 12th grade  Occupational History   Occupation: Designer, jewellery  Tobacco Use   Smoking status: Former    Current packs/day: 1.00    Average packs/day: 1 pack/day for 20.0 years (20.0 ttl pk-yrs)    Types: Cigarettes   Smokeless tobacco: Never  Vaping Use   Vaping status: Some Days   Substances: Nicotine, CBD, Flavoring  Substance and Sexual Activity   Alcohol use: Not Currently    Comment: Haven't had alcohol in a year due to medication   Drug use: No    Comment: vapes w/ delta 8   Sexual activity: Yes    Partners: Female    Birth control/protection: Other-see comments    Comment: Essure  Other Topics Concern  Not on file  Social History Narrative   Pt lives with wife    Pt not working    Social Drivers of Corporate investment banker Strain: Low Risk  (03/25/2024)   Overall Financial Resource Strain (CARDIA)    Difficulty of Paying Living Expenses: Not very hard  Recent Concern: Financial Resource Strain - High Risk (01/22/2024)   Overall Financial Resource Strain (CARDIA)    Difficulty of Paying Living Expenses: Very hard  Food Insecurity: Food Insecurity Present (03/27/2024)   Hunger Vital Sign    Worried About Running Out of Food in the Last Year: Sometimes true    Ran Out of Food in the Last Year: Sometimes true  Transportation Needs: No Transportation Needs (03/27/2024)   PRAPARE - Administrator, Civil Service (Medical): No    Lack of Transportation (Non-Medical): No  Physical Activity: Inactive (03/25/2024)   Exercise Vital Sign    Days of Exercise per Week: 0 days    Minutes of Exercise per Session: Not on file  Stress: Stress Concern Present (03/27/2024)   [employer] of Occupational Health - Occupational Stress Questionnaire    Feeling of Stress: Very much  Social Connections: Moderately Isolated (03/25/2024)   Social Connection and Isolation Panel    Frequency of Communication with Friends and Family: More than three times a  week    Frequency of Social Gatherings with Friends and Family: Once a week    Attends Religious Services: Never    Database administrator or Organizations: No    Attends Engineer, structural: Not on file    Marital Status: Living with partner  Intimate Partner Violence: Not At Risk (08/10/2023)   Humiliation, Afraid, Rape, and Kick questionnaire    Fear of Current or Ex-Partner: No    Emotionally Abused: No    Physically Abused: No    Sexually Abused: No    Past Medical History, Surgical history, Social history, and Family history were reviewed and updated as appropriate.   Please see review of systems for further details on the patient's review from today.   Objective:   Physical Exam:  There were no vitals taken for this visit.  Physical Exam Constitutional:      General: He is not in acute distress. Musculoskeletal:        General: No deformity.  Neurological:     Mental Status: He is alert and oriented to person, place, and time.     Coordination: Coordination normal.  Psychiatric:        Attention and Perception: Attention and perception normal. He does not perceive auditory or visual hallucinations.        Mood and Affect: Mood normal. Mood is not anxious or depressed. Affect is not labile, blunt, angry or inappropriate.        Speech: Speech normal.        Behavior: Behavior normal.        Thought Content: Thought content normal. Thought content is not paranoid or delusional. Thought content does not include homicidal or suicidal ideation. Thought content does not include homicidal or suicidal plan.        Cognition and Memory: Cognition and memory normal.        Judgment: Judgment normal.     Comments: Insight intact     Lab Review:     Component Value Date/Time   NA 138 05/31/2024 0927   K 4.3 05/31/2024 0927   CL 100 05/31/2024 0927  CO2 19 (L) 05/31/2024 0927   GLUCOSE 109 (H) 05/31/2024 0927   GLUCOSE 104 (H) 01/26/2024 1703   BUN 12  05/31/2024 0927   CREATININE 1.17 05/31/2024 0927   CALCIUM  9.2 05/31/2024 0927   PROT 7.1 03/29/2024 0922   ALBUMIN 4.7 03/29/2024 0922   AST 24 03/29/2024 0922   ALT 47 (H) 03/29/2024 0922   ALKPHOS 128 (H) 03/29/2024 0922   BILITOT 0.4 03/29/2024 0922   GFRNONAA >60 01/26/2024 1703   GFRAA >60 06/23/2020 0620       Component Value Date/Time   WBC 11.5 (H) 03/29/2024 0922   WBC 10.9 (H) 01/26/2024 1703   RBC 5.15 03/29/2024 0922   RBC 5.66 01/26/2024 1703   HGB 15.6 03/29/2024 0922   HCT 47.5 03/29/2024 0922   PLT 290 03/29/2024 0922   MCV 92 03/29/2024 0922   MCH 30.3 03/29/2024 0922   MCH 30.4 01/26/2024 1703   MCHC 32.8 03/29/2024 0922   MCHC 33.5 01/26/2024 1703   RDW 13.4 03/29/2024 0922   LYMPHSABS 2.0 03/29/2024 0922   MONOABS 0.7 08/10/2023 0844   EOSABS 0.2 03/29/2024 0922   BASOSABS 0.1 03/29/2024 0922    No results found for: POCLITH, LITHIUM    No results found for: PHENYTOIN, PHENOBARB, VALPROATE, CBMZ   .res Assessment: Plan:    Plan:  Monitoring BP  Abilify  20mg  daily to target mood instability.  Lithium  150mg  at bedtime - to target passive SI.  Wellbutrin  XL 300mg  daily - denies seizure history. Buspar  10mg  TID Adderall 30mg  daily  ADHD outside testing - tested 3 - adult ADHD. Consider referral for psychological testing.  RTC 4 weeks  25 minutes spent dedicated to the care of this patient on the date of this encounter to include pre-visit review of records, ordering of medication, post visit documentation, and face-to-face time with the patient discussing GAD, insomnia, ADHD, and BPD 1. Discussed continuing current medication regimen.  Patient remains totally disabled and unable to work currently 05/13/2024 through 09/12/2024.  Discussed potential metabolic side effects associated with atypical antipsychotics, as well as potential risk for movement side effects. Advised pt to contact office if movement side effects occur.    Discussed potential benefits, risk, and side effects of benzodiazepines to include potential risk of tolerance and dependence, as well as possible drowsiness.  Advised patient not to drive if experiencing drowsiness and to take lowest possible effective dose to minimize risk of dependence and tolerance.   Discussed potential benefits, risks, and side effects of stimulants with patient to include increased heart rate, palpitations, insomnia, increased anxiety, increased irritability, or decreased appetite.  Instructed patient to contact office if experiencing any significant tolerability issues.   Diagnoses and all orders for this visit:  High risk medication use  Attention deficit hyperactivity disorder (ADHD), unspecified ADHD type  Generalized anxiety disorder  Bipolar I disorder (HCC)     Please see After Visit Summary for patient specific instructions.  Future Appointments  Date Time Provider Department Center  06/19/2024  9:00 AM Hilma Hastings, [place] [facility] None  07/08/2024  8:00 PM Shlomo Wilbert SAUNDERS, MD [facility]  07/09/2024  1:30 PM Herschel Allean CROME, RPH-CPP [facility] [id number]  10/10/2024  8:40 AM Zarwolo, Gloria, FNP [facility] [address]  10/29/2024  2:45 PM Whitfield Raisin, NP [facility] None    No orders of the defined types were placed in this encounter.     -------------------------------

## 2024-06-19 ENCOUNTER — Encounter: Payer: Self-pay | Admitting: Orthopedic Surgery

## 2024-06-19 ENCOUNTER — Ambulatory Visit: Admitting: Orthopedic Surgery

## 2024-06-19 VITALS — BP 138/102 | Wt 253.0 lb

## 2024-06-19 DIAGNOSIS — M542 Cervicalgia: Secondary | ICD-10-CM

## 2024-06-19 DIAGNOSIS — M47816 Spondylosis without myelopathy or radiculopathy, lumbar region: Secondary | ICD-10-CM | POA: Diagnosis not present

## 2024-06-19 DIAGNOSIS — R200 Anesthesia of skin: Secondary | ICD-10-CM | POA: Diagnosis not present

## 2024-06-19 DIAGNOSIS — R292 Abnormal reflex: Secondary | ICD-10-CM

## 2024-06-19 DIAGNOSIS — R29898 Other symptoms and signs involving the musculoskeletal system: Secondary | ICD-10-CM

## 2024-06-19 DIAGNOSIS — R2681 Unsteadiness on feet: Secondary | ICD-10-CM

## 2024-06-19 DIAGNOSIS — R202 Paresthesia of skin: Secondary | ICD-10-CM

## 2024-06-19 NOTE — Patient Instructions (Signed)
 It was so nice to see you today. Thank you so much for coming in.    I want to get an MRI of your neck and lower back to look into things further. We will get this approved through your insurance and [facility] will call you to schedule the appointment. Ask about your patient responsibility. You do not need to pay this prior to getting MRI, they can bill you.   [facility] (building with the white pillars) is located off of [address]. The address is [address], [place], [place] [address].    After you have the MRI scans, it can take 14-28 days for me to get the results back. If I don't have them in 2 weeks, we will call to try to get the results.   Once I have the results, we will call you to schedule a follow up visit with me to review them. Can do MyChart visit or you can come into the office.   Your blood pressure was elevated today. I want you to recheck it at home and follow up with your PCP if it remains high. If you have any chest pain, shortness of breath, blurry vision, or headaches then you need to go to ED.    Please do not hesitate to call if you have any questions or concerns. You can also message me in MyChart.   Glade Boys PA-C [phone]     The physicians and staff at [facility] are committed to providing excellent care. You may receive a survey asking for feedback about your experience at our office. We value you your feedback and appreciate you taking the time to to fill it out. The [facility] leadership team is also available to discuss your experience in person, feel free to contact us  [phone].

## 2024-06-21 ENCOUNTER — Ambulatory Visit: Admitting: Family Medicine

## 2024-06-24 ENCOUNTER — Ambulatory Visit (HOSPITAL_COMMUNITY)
Admission: RE | Admit: 2024-06-24 | Discharge: 2024-06-24 | Disposition: A | Discharge status: Home / Self Care | Source: Ambulatory Visit | Attending: Orthopedic Surgery | Admitting: Orthopedic Surgery

## 2024-06-24 ENCOUNTER — Ambulatory Visit (HOSPITAL_BASED_OUTPATIENT_CLINIC_OR_DEPARTMENT_OTHER)
Admission: RE | Admit: 2024-06-24 | Discharge: 2024-06-24 | Disposition: A | Discharge status: Home / Self Care | Source: Ambulatory Visit | Attending: Orthopedic Surgery | Admitting: Orthopedic Surgery

## 2024-06-24 DIAGNOSIS — M48061 Spinal stenosis, lumbar region without neurogenic claudication: Secondary | ICD-10-CM

## 2024-06-24 DIAGNOSIS — M542 Cervicalgia: Secondary | ICD-10-CM

## 2024-06-24 DIAGNOSIS — G9589 Other specified diseases of spinal cord: Secondary | ICD-10-CM | POA: Diagnosis not present

## 2024-06-24 DIAGNOSIS — M51369 Other intervertebral disc degeneration, lumbar region without mention of lumbar back pain or lower extremity pain: Secondary | ICD-10-CM | POA: Diagnosis not present

## 2024-06-24 DIAGNOSIS — M50022 Cervical disc disorder at C5-C6 level with myelopathy: Secondary | ICD-10-CM | POA: Diagnosis not present

## 2024-06-24 DIAGNOSIS — M5021 Other cervical disc displacement,  high cervical region: Secondary | ICD-10-CM

## 2024-06-24 DIAGNOSIS — R292 Abnormal reflex: Secondary | ICD-10-CM | POA: Diagnosis present

## 2024-06-24 DIAGNOSIS — G9520 Unspecified cord compression: Secondary | ICD-10-CM | POA: Diagnosis not present

## 2024-06-24 DIAGNOSIS — M50222 Other cervical disc displacement at C5-C6 level: Secondary | ICD-10-CM | POA: Diagnosis not present

## 2024-06-24 DIAGNOSIS — M47816 Spondylosis without myelopathy or radiculopathy, lumbar region: Secondary | ICD-10-CM | POA: Diagnosis present

## 2024-06-26 ENCOUNTER — Telehealth: Payer: Self-pay | Admitting: Orthopedic Surgery

## 2024-06-26 NOTE — Telephone Encounter (Signed)
 Cervical MRI dated 06/24/24:  FINDINGS: The craniocervical junction is normal.   There is no significant bone marrow signal abnormality.   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   C2-C3: Small central disc herniation.  Otherwise normal   C3-C4: There is a central disc herniation with effacement of the thecal sac without compression of the cord. The facet joints are normal. No foraminal stenosis   C4-C5: There is a central disc herniation with indention of the ventral aspect of the cord. Facet joints are normal. No foraminal stenosis   C5-C6: There is a central and right paracentral disc herniation with compression of the cord. The facet joints are normal. No foraminal stenosis   C6-C7: Normal   C7-T1: Normal   IMPRESSION: 1. There is a central and right paracentral disc herniation at C5-6 with severe spinal stenosis and compression of the cord. 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 2. There is moderate spinal stenosis at C3-4 and C4-5 caused by central disc herniations 3. The degree of myelomalacia of the cord at the C5-6 level is worse than on the prior study. There is increased cord volume loss and increased T2 signal in the cord.     Electronically Signed   By: Nancyann Burns M.D.   On: 06/26/2024 09:03       Lumbar MRI dated 06/24/24:  FINDINGS: Bone marrow: There is a mild chronic compression fracture of the superior endplate of L5. There are Modic type 1 changes in the superior endplate.   Conus and cauda equina: No significant abnormality   Paraspinal tissues: No significant abnormality   L1-L2: Normal   L2-L3: The disc is normal.  Mild facet arthropathy   L3-L4: There is a mild disc bulge. Mild facet arthropathy. There is mild spinal stenosis. No foraminal stenosis   L4-L5: Previous posterior decompression at this level. There is a disc bulge with mild facet  arthropathy. No significant spinal stenosis   L5-S1: There is a mild disc bulge. Mild facet arthropathy. No spinal stenosis or foraminal stenosis   IMPRESSION: 1. Previous posterior decompression at L4-5 without spinal stenosis 2. Mild spinal stenosis at L3-4 due to disc bulge and facet arthropathy 3. Chronic compression fracture of the superior endplate of L5.     Electronically Signed   By: Nancyann Burns M.D.   On: 06/26/2024 09:07   I have personally reviewed the images and agree with the above interpretation.

## 2024-06-26 NOTE — Telephone Encounter (Signed)
 Spoke with patient and briefly reviewed MRI findings. Discussed severe cervical stenosis and myelomalacia.   He will follow up with Dr. Clois as scheduled.

## 2024-06-26 NOTE — Telephone Encounter (Signed)
 Imaging reviewed with Dr. Clois. Patient scheduled to see him to discuss surgery options for severe central stenosis C5-C6 with myelomalacia.   I called patient and LM for him to return my call to discuss.

## 2024-06-27 NOTE — Progress Notes (Signed)
 Referring Physician:  Edman Meade PEDLAR, FNP [address] [place],  [place] [address]  Primary Physician:  Edman Meade PEDLAR, FNP  History of Present Illness: 07/03/2024 Jay Montgomery is here today with a chief complaint of  cervical spinal cord compression who presents with numbness, tingling, and motor control issues in his hands. He was referred by [facility] for evaluation of his cervical spinal cord issues.  He experiences numbness and tingling in his hands, with difficulties in fine motor control, such as handling keys and opening jars. These symptoms have impacted his daily activities. He reports no current neck pain.  He began using a cane approximately two to three months ago due to balance issues. He is currently pursuing disability due to his inability to work, related to back pain and mobility issues.  He has undergone two back surgeries, with the first resulting in a spinal fluid leak that required a second operation for a CSF leak  Jay Montgomery has symptoms of cervical myelopathy.  The symptoms are causing a significant impact on the patient's life.   I have utilized the care everywhere function in epic to review the outside records available from external health systems.  Progress Note from Glade Boys, [name] on 06/27/24:  History of Present Illness: 06/19/2024 Jay Montgomery has a history of cerebral vasoconstriction syndrome, HTN, GAD, tachycardia, hyperlipidemia, depression, ADHD, bipolar, OCD, PTSD.    History of L4-L5 microdiscectomy with CSF leak on 12/01/17 with Dr. Louis. He had 1 previous lumbar surgeries as well. He's had chronic pain for years.    He had some improvement after surgery in 2019 for 1-2 years then pain returned.    He also has constant LBP with no leg pain. He has  weakness in his legs causing him to fall. He is using a cane to ambulate. No numbness or tingling. Pain is worse with sitting, standing, and walking. Pain is better  with nothing.    He has constant neck pain with no arm pain. He has numbness and tingling in both hands. He had weakness in both hands and is dropping things. Pain is worse with being active. No alleviating factors. No dexterity issues. He thinks he has balance issues as well.     He is taking neurontin  and oxycodone . He is seeing [facility].    Tobacco use: Does not smoke. He vapes with nicotine.    Bowel/Bladder Dysfunction: none   Conservative measures:  Physical therapy: did PT for back 6 years ago  Multimodal medical therapy including regular antiinflammatories: neurontin   Injections:  Had lumbar ESIs years ago.    Past Surgery:  12/01/2017 Lumber Laminectomy/Decompression Microdiscetomy with CSF leak by Dr. Louis 1 previous lumbar surgeries.  Review of Systems:  A 10 point review of systems is negative, except for the pertinent positives and negatives detailed in the HPI.  Past Medical History: Past Medical History:  Diagnosis Date   Allergy    Anxiety    Chicken pox    Depression    Headache    due to CSF leak   Heart murmur    as a child   Spinal stenosis     Past Surgical History: Past Surgical History:  Procedure Laterality Date   ADENOIDECTOMY     BACK SURGERY  08/17/2017   L4-L5   LUMBAR LAMINECTOMY/DECOMPRESSION MICRODISCECTOMY N/A 12/01/2017   Procedure: REPAIR OF CEREBROSPINAL FLUID LEAK and Placement of Lumbar Drain;  Surgeon: Louis Shove, MD;  Location: [facility] OR;  Service: Neurosurgery;  Laterality: N/A;   SPINE SURGERY  07-2017   2 back surgeries   TONSILLECTOMY      Allergies: Allergies as of 07/03/2024 - Review Complete 06/19/2024  Allergen Reaction Noted   Hydrocodone  Itching 02/27/2024   Hydroxyzine  Other (See Comments) 01/25/2019   Hydrocodone -acetaminophen  Hives, Itching, and Rash 01/23/2019    Medications:  Current Outpatient Medications:    amphetamine -dextroamphetamine  (ADDERALL) 30 MG tablet, Take one tablet daily.,  Disp: 30 tablet, Rfl: 0   ARIPiprazole  (ABILIFY ) 10 MG tablet, Take 1 tablet (10 mg total) by mouth 2 (two) times daily., Disp: 60 tablet, Rfl: 2   buPROPion  (WELLBUTRIN  XL) 150 MG 24 hr tablet, Take two tablets every morning., Disp: 60 tablet, Rfl: 2   busPIRone  (BUSPAR ) 10 MG tablet, Take 1 tablet (10 mg total) by mouth 3 (three) times daily., Disp: 90 tablet, Rfl: 2   carvedilol  (COREG ) 6.25 MG tablet, Take 1 tablet (6.25 mg total) by mouth 2 (two) times daily., Disp: 180 tablet, Rfl: 1   divalproex  (DEPAKOTE  ER) 500 MG 24 hr tablet, Take 1 tablet (500 mg total) by mouth daily., Disp: 30 tablet, Rfl: 5   gabapentin  (NEURONTIN ) 300 MG capsule, Take 1 capsule (300 mg total) by mouth at bedtime., Disp: 90 capsule, Rfl: 3   lithium  carbonate 150 MG capsule, Take 1 capsule (150 mg total) by mouth at bedtime., Disp: 30 capsule, Rfl: 2   naloxone (NARCAN) nasal spray 4 mg/0.1 mL, Place 1 spray into the nose once., Disp: , Rfl:    NIFEdipine  (PROCARDIA  XL/NIFEDICAL-XL) 90 MG 24 hr tablet, Take 1 tablet (90 mg total) by mouth daily., Disp: 30 tablet, Rfl: 6   oxyCODONE  (OXY IR/ROXICODONE ) 5 MG immediate release tablet, Take 5 mg by mouth every 4 (four) hours as needed., Disp: , Rfl:    rosuvastatin  (CRESTOR ) 10 MG tablet, Take 1 tablet (10 mg total) by mouth daily., Disp: 90 tablet, Rfl: 3   spironolactone  (ALDACTONE ) 25 MG tablet, Take 0.5 tablets (12.5 mg total) by mouth daily., Disp: 45 tablet, Rfl: 3   Vitamin D , Ergocalciferol , (DRISDOL ) 1.25 MG (50000 UNIT) CAPS capsule, Take 1 capsule (50,000 Units total) by mouth every 7 (seven) days., Disp: 27 capsule, Rfl: 1  Social History: Social History   Tobacco Use   Smoking status: Former    Current packs/day: 1.00    Average packs/day: 1 pack/day for 20.0 years (20.0 ttl pk-yrs)    Types: Cigarettes   Smokeless tobacco: Never  Vaping Use   Vaping status: Some Days   Substances: Nicotine, CBD, Flavoring  Substance Use Topics   Alcohol use: Not  Currently    Comment: Haven't had alcohol in a year due to medication   Drug use: No    Comment: vapes w/ delta 8    Family Medical History: Family History  Problem Relation Age of Onset   Arthritis Mother    Hypertension Mother    COPD Mother    Emphysema Mother    Bipolar disorder Mother    Hypertension Father    Arthritis Father    Diabetes Father    Neuropathy Father    Bipolar disorder Sister    Schizophrenia Sister    ADD / ADHD Sister    Anxiety disorder Sister    Atrial fibrillation Sister     Physical Examination: Vitals:   07/03/24 1254  BP: (!) 146/102    General: Patient is in no apparent distress. Attention to examination is  appropriate.  Neck:   Supple.  Full range of motion.  Respiratory: Patient is breathing without any difficulty.   NEUROLOGICAL:     Awake, alert, oriented to person, place, and time.  Speech is clear and fluent.   Cranial Nerves: Pupils equal round and reactive to light.  Facial tone is symmetric.  Facial sensation is symmetric. Shoulder shrug is symmetric. Tongue protrusion is midline.  There is no pronator drift.  Strength: Side Biceps Triceps Deltoid Interossei Grip Wrist Ext. Wrist Flex.  R 5 5 5 4  4+ 5 5  L 5 5 5 4  4+ 5 5   Side Iliopsoas Quads Hamstring PF DF EHL  R 5 5 5 5 5 5   L 5 5 5 5 5 5    Reflexes are 3+ and symmetric at the biceps, triceps, brachioradialis, patella and achilles.   Hoffman's is present.   Bilateral upper and lower extremity sensation is symmetric but diminished to light touch.    No evidence of dysmetria noted.  Gait is unstable - uses cane.  Cannot perform tandem walk.   Medical Decision Making  Imaging: MR C spine 06/24/2024 IMPRESSION: 1. There is a central and right paracentral disc herniation at C5-6 with severe spinal stenosis and compression of the cord. 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 2. There is moderate spinal stenosis at C3-4 and C4-5 caused by central  disc herniations 3. The degree of myelomalacia of the cord at the C5-6 level is worse than on the prior study. There is increased cord volume loss and increased T2 signal in the cord.     Electronically Signed   By: Nancyann Burns M.D.   On: 06/26/2024 09:03  MR L spine 06/24/2024 IMPRESSION: 1. Previous posterior decompression at L4-5 without spinal stenosis 2. Mild spinal stenosis at L3-4 due to disc bulge and facet arthropathy 3. Chronic compression fracture of the superior endplate of L5.     Electronically Signed   By: Nancyann Burns M.D.   On: 06/26/2024 09:07    I have personally reviewed the images and agree with the above interpretation.  Assessment and Plan: Jay Montgomery is a pleasant 38 y.o. male with cervical myelopathy.  He has severe stenosis at C5-6 and moderate stenosis at C3-4 and C4-5.  He has myelomalacia.  He has symptomatic cervical myelopathy.  There is no role for conservative management.  I recommended C3-6 anterior cervical discectomy and fusion.  I discussed the planned procedure at length with the patient, including the risks, benefits, alternatives, and indications. The risks discussed include but are not limited to bleeding, infection, need for reoperation, spinal fluid leak, stroke, vision loss, anesthetic complication, coma, paralysis, and even death. We also discussed the possibility of post-operative dysphagia, vocal cord paralysis, and the risk of adjacent segment disease in the future. I also described in detail that improvement was not guaranteed.  The patient expressed understanding of these risks, and asked that we proceed with surgery. I described the surgery in layman's terms, and gave ample opportunity for questions, which were answered to the best of my ability.   I spent a total of 30 minutes in this patient's care today. This time was spent reviewing pertinent records including imaging studies, obtaining and confirming history, performing a  directed evaluation, formulating and discussing my recommendations, and documenting the visit within the medical record.      Thank you for involving me in the care of this patient.  Nneoma Harral K. Clois MD, [facility]

## 2024-06-27 NOTE — H&P (View-Only) (Signed)
 Referring Physician:  Edman Meade PEDLAR, FNP [address] [place],  [place] [address]  Primary Physician:  Edman Meade PEDLAR, FNP  History of Present Illness: 07/03/2024 Jay Montgomery is here today with a chief complaint of  cervical spinal cord compression who presents with numbness, tingling, and motor control issues in his hands. He was referred by [facility] for evaluation of his cervical spinal cord issues.  He experiences numbness and tingling in his hands, with difficulties in fine motor control, such as handling keys and opening jars. These symptoms have impacted his daily activities. He reports no current neck pain.  He began using a cane approximately two to three months ago due to balance issues. He is currently pursuing disability due to his inability to work, related to back pain and mobility issues.  He has undergone two back surgeries, with the first resulting in a spinal fluid leak that required a second operation for a CSF leak  Micahel A Rooks has symptoms of cervical myelopathy.  The symptoms are causing a significant impact on the patient's life.   I have utilized the care everywhere function in epic to review the outside records available from external health systems.  Progress Note from [place]ade Boys, [name] on 06/27/24:  History of Present Illness: 06/19/2024 [name] has a history of cerebral vasoconstriction syndrome, HTN, GAD, tachycardia, hyperlipidemia, depression, ADHD, bipolar, OCD, PTSD.    History of L4-L5 microdiscectomy with CSF leak on 12/01/17 with Dr. Louis. He had 1 previous lumbar surgeries as well. He's had chronic pain for years.    He had some improvement after surgery in 2019 for 1-2 years then pain returned.    He also has constant LBP with no leg pain. He has  weakness in his legs causing him to fall. He is using a cane to ambulate. No numbness or tingling. Pain is worse with sitting, standing, and walking. Pain is better  with nothing.    He has constant neck pain with no arm pain. He has numbness and tingling in both hands. He had weakness in both hands and is dropping things. Pain is worse with being active. No alleviating factors. No dexterity issues. He thinks he has balance issues as well.     He is taking neurontin  and oxycodone . He is seeing [facility].    Tobacco use: Does not smoke. He vapes with nicotine.    Bowel/Bladder Dysfunction: none   Conservative measures:  Physical therapy: did PT for back 6 years ago  Multimodal medical therapy including regular antiinflammatories: neurontin   Injections:  Had lumbar ESIs years ago.    Past Surgery:  12/01/2017 Lumber Laminectomy/Decompression Microdiscetomy with CSF leak by Dr. Louis 1 previous lumbar surgeries.  Review of Systems:  A 10 point review of systems is negative, except for the pertinent positives and negatives detailed in the HPI.  Past Medical History: Past Medical History:  Diagnosis Date   Allergy    Anxiety    Chicken pox    Depression    Headache    due to CSF leak   Heart murmur    as a child   Spinal stenosis     Past Surgical History: Past Surgical History:  Procedure Laterality Date   ADENOIDECTOMY     BACK SURGERY  08/17/2017   L4-L5   LUMBAR LAMINECTOMY/DECOMPRESSION MICRODISCECTOMY N/A 12/01/2017   Procedure: REPAIR OF CEREBROSPINAL FLUID LEAK and Placement of Lumbar Drain;  Surgeon: Louis Shove, MD;  Location: [facility] OR;  Service: Neurosurgery;  Laterality: N/A;   SPINE SURGERY  07-2017   2 back surgeries   TONSILLECTOMY      Allergies: Allergies as of 07/03/2024 - Review Complete 06/19/2024  Allergen Reaction Noted   Hydrocodone  Itching 02/27/2024   Hydroxyzine  Other (See Comments) 01/25/2019   Hydrocodone -acetaminophen  Hives, Itching, and Rash 01/23/2019    Medications:  Current Outpatient Medications:    amphetamine -dextroamphetamine  (ADDERALL) 30 MG tablet, Take one tablet daily.,  Disp: 30 tablet, Rfl: 0   ARIPiprazole  (ABILIFY ) 10 MG tablet, Take 1 tablet (10 mg total) by mouth 2 (two) times daily., Disp: 60 tablet, Rfl: 2   buPROPion  (WELLBUTRIN  XL) 150 MG 24 hr tablet, Take two tablets every morning., Disp: 60 tablet, Rfl: 2   busPIRone  (BUSPAR ) 10 MG tablet, Take 1 tablet (10 mg total) by mouth 3 (three) times daily., Disp: 90 tablet, Rfl: 2   carvedilol  (COREG ) 6.25 MG tablet, Take 1 tablet (6.25 mg total) by mouth 2 (two) times daily., Disp: 180 tablet, Rfl: 1   divalproex  (DEPAKOTE  ER) 500 MG 24 hr tablet, Take 1 tablet (500 mg total) by mouth daily., Disp: 30 tablet, Rfl: 5   gabapentin  (NEURONTIN ) 300 MG capsule, Take 1 capsule (300 mg total) by mouth at bedtime., Disp: 90 capsule, Rfl: 3   lithium  carbonate 150 MG capsule, Take 1 capsule (150 mg total) by mouth at bedtime., Disp: 30 capsule, Rfl: 2   naloxone (NARCAN) nasal spray 4 mg/0.1 mL, Place 1 spray into the nose once., Disp: , Rfl:    NIFEdipine  (PROCARDIA  XL/NIFEDICAL-XL) 90 MG 24 hr tablet, Take 1 tablet (90 mg total) by mouth daily., Disp: 30 tablet, Rfl: 6   oxyCODONE  (OXY IR/ROXICODONE ) 5 MG immediate release tablet, Take 5 mg by mouth every 4 (four) hours as needed., Disp: , Rfl:    rosuvastatin  (CRESTOR ) 10 MG tablet, Take 1 tablet (10 mg total) by mouth daily., Disp: 90 tablet, Rfl: 3   spironolactone  (ALDACTONE ) 25 MG tablet, Take 0.5 tablets (12.5 mg total) by mouth daily., Disp: 45 tablet, Rfl: 3   Vitamin D , Ergocalciferol , (DRISDOL ) 1.25 MG (50000 UNIT) CAPS capsule, Take 1 capsule (50,000 Units total) by mouth every 7 (seven) days., Disp: 27 capsule, Rfl: 1  Social History: Social History   Tobacco Use   Smoking status: Former    Current packs/day: 1.00    Average packs/day: 1 pack/day for 20.0 years (20.0 ttl pk-yrs)    Types: Cigarettes   Smokeless tobacco: Never  Vaping Use   Vaping status: Some Days   Substances: Nicotine, CBD, Flavoring  Substance Use Topics   Alcohol use: Not  Currently    Comment: Haven't had alcohol in a year due to medication   Drug use: No    Comment: vapes w/ delta 8    Family Medical History: Family History  Problem Relation Age of Onset   Arthritis Mother    Hypertension Mother    COPD Mother    Emphysema Mother    Bipolar disorder Mother    Hypertension Father    Arthritis Father    Diabetes Father    Neuropathy Father    Bipolar disorder Sister    Schizophrenia Sister    ADD / ADHD Sister    Anxiety disorder Sister    Atrial fibrillation Sister     Physical Examination: Vitals:   07/03/24 1254  BP: (!) 146/102    General: Patient is in no apparent distress. Attention to examination is  appropriate.  Neck:   Supple.  Full range of motion.  Respiratory: Patient is breathing without any difficulty.   NEUROLOGICAL:     Awake, alert, oriented to person, place, and time.  Speech is clear and fluent.   Cranial Nerves: Pupils equal round and reactive to light.  Facial tone is symmetric.  Facial sensation is symmetric. Shoulder shrug is symmetric. Tongue protrusion is midline.  There is no pronator drift.  Strength: Side Biceps Triceps Deltoid Interossei Grip Wrist Ext. Wrist Flex.  R 5 5 5 4  4+ 5 5  L 5 5 5 4  4+ 5 5   Side Iliopsoas Quads Hamstring PF DF EHL  R 5 5 5 5 5 5   L 5 5 5 5 5 5    Reflexes are 3+ and symmetric at the biceps, triceps, brachioradialis, patella and achilles.   Hoffman's is present.   Bilateral upper and lower extremity sensation is symmetric but diminished to light touch.    No evidence of dysmetria noted.  Gait is unstable - uses cane.  Cannot perform tandem walk.   Medical Decision Making  Imaging: MR C spine 06/24/2024 IMPRESSION: 1. There is a central and right paracentral disc herniation at C5-6 with severe spinal stenosis and compression of the cord. 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 2. There is moderate spinal stenosis at C3-4 and C4-5 caused by central  disc herniations 3. The degree of myelomalacia of the cord at the C5-6 level is worse than on the prior study. There is increased cord volume loss and increased T2 signal in the cord.     Electronically Signed   By: Nancyann Burns M.D.   On: 06/26/2024 09:03  MR L spine 06/24/2024 IMPRESSION: 1. Previous posterior decompression at L4-5 without spinal stenosis 2. Mild spinal stenosis at L3-4 due to disc bulge and facet arthropathy 3. Chronic compression fracture of the superior endplate of L5.     Electronically Signed   By: Nancyann Burns M.D.   On: 06/26/2024 09:07    I have personally reviewed the images and agree with the above interpretation.  Assessment and Plan: Mr. Smethers is a pleasant 38 y.o. male with cervical myelopathy.  He has severe stenosis at C5-6 and moderate stenosis at C3-4 and C4-5.  He has myelomalacia.  He has symptomatic cervical myelopathy.  There is no role for conservative management.  I recommended C3-6 anterior cervical discectomy and fusion.  I discussed the planned procedure at length with the patient, including the risks, benefits, alternatives, and indications. The risks discussed include but are not limited to bleeding, infection, need for reoperation, spinal fluid leak, stroke, vision loss, anesthetic complication, coma, paralysis, and even death. We also discussed the possibility of post-operative dysphagia, vocal cord paralysis, and the risk of adjacent segment disease in the future. I also described in detail that improvement was not guaranteed.  The patient expressed understanding of these risks, and asked that we proceed with surgery. I described the surgery in layman's terms, and gave ample opportunity for questions, which were answered to the best of my ability.   I spent a total of 30 minutes in this patient's care today. This time was spent reviewing pertinent records including imaging studies, obtaining and confirming history, performing a  directed evaluation, formulating and discussing my recommendations, and documenting the visit within the medical record.      Thank you for involving me in the care of this patient.  Nneoma Harral K. Clois MD, [facility]

## 2024-07-03 ENCOUNTER — Other Ambulatory Visit: Payer: Self-pay

## 2024-07-03 ENCOUNTER — Ambulatory Visit: Admitting: Neurosurgery

## 2024-07-03 VITALS — BP 146/102 | Ht 73.0 in | Wt 249.5 lb

## 2024-07-03 DIAGNOSIS — M4802 Spinal stenosis, cervical region: Secondary | ICD-10-CM

## 2024-07-03 DIAGNOSIS — G9589 Other specified diseases of spinal cord: Secondary | ICD-10-CM | POA: Diagnosis not present

## 2024-07-03 DIAGNOSIS — G959 Disease of spinal cord, unspecified: Secondary | ICD-10-CM

## 2024-07-03 DIAGNOSIS — Z01818 Encounter for other preprocedural examination: Secondary | ICD-10-CM

## 2024-07-03 NOTE — Patient Instructions (Signed)
 Please see below for information in regards to your upcoming surgery:   Planned surgery: C3-6 anterior cervical discectomy and fusion   Surgery date: 07/27/24 at [facility] (Medical Mall: [address] [place], [place] [address]) - you will find out your arrival time the business day before your surgery.   Pre-op appointment at [facility] Pre-admit Testing: you will receive a call with a date/time for this appointment. If you are scheduled for an in person appointment, Pre-admit Testing is located on the first floor of the [facility] building, [address] [facility], [address]. During this appointment, they will advise you which medications you can take the morning of surgery, and which medications you will need to hold for surgery. Labs (such as blood work, EKG) may be done at your pre-op appointment. You are not required to fast for these labs. Should you need to change your pre-op appointment, please call Pre-admit testing at [phone].      Brace: You will need to bring the brace to the hospital on the day of surgery. [facility] will contact you regarding an appointment for the brace you will use after surgery. If it is getting close to your surgery date and you have not received an appointment with [facility], please reach out to us .  Their number is [phone] (for the [place] location) or [phone] (for the [facility] location) should you miss their call or have an issue with your brace after surgery.     NSAIDS (Non-steroidal anti-inflammatory drugs): because you are having a fusion, please avoid taking any NSAIDS (examples: ibuprofen , motrin , aleve , naproxen , meloxicam, diclofenac ) for 3 months after surgery. Celebrex is an exception and is OK to take, if prescribed. Tylenol  is not an NSAID.    Common restrictions after spine surgery: No bending, lifting, or twisting ("BLT"). Avoid lifting objects heavier than 10 pounds for the first 6 weeks  after surgery. Where possible, avoid household activities that involve lifting, bending, reaching, pushing, or pulling such as laundry, vacuuming, grocery shopping, and childcare. Try to arrange for help from friends and family for these activities while you heal. Do not drive while taking prescription pain medication. Weeks 6 through 12 after surgery: avoid lifting more than 25 pounds.    X-rays after surgery: Because you are having a fusion or arthroplasty: for appointments after your 2 week follow-up: please arrive our office 30 minutes prior to your appointment for x-rays. This applies to every appointment after your 2 week follow-up. Failure to do so may result in your appointment being rescheduled.    How to contact us :  If you have any questions/concerns before or after surgery, you can reach us  at [phone], or you can send a mychart message. We can be reached by phone or mychart 8am-4pm, Monday-Friday.  *Please note: Calls after 4pm are forwarded to a third party answering service. Mychart messages are not routinely monitored during evenings, weekends, and holidays. Please call our office to contact the answering service for urgent concerns during non-business hours.    If you have FMLA/disability paperwork, please drop it off or fax it to [phone]   Appointments/FMLA & disability paperwork: Reche Hait, & Nichole Registered Nurse/Surgery scheduler: Myha Arizpe, RN & Katie, RN Certified Medical Assistants: Don, CMA, Elenor, CMA, Damien, CMA, & Auston, [place] Physician Assistants: Lyle Decamp, PA-C, Edsel Goods, PA-C & Glade Boys, PA-C Surgeons: Penne Sharps, MD & Reeves Daisy, MD   [facility] PREADMIT TESTING VISIT and SURGERY INFORMATION SHEET  Now that surgery has been scheduled you can anticipate several phone calls from [facility]. A pharmacy technician will call you to verify your current list of medications taken at home.                The Pre-Service Center will call to verify your insurance information and to give you billing estimates and information.             The Preadmit Testing Office will be calling to schedule a visit to obtain information for the anesthesia team and provide instructions on preparation for surgery.  What can you expect for the Preadmit Testing Visit: Appointments may be scheduled in-person or by telephone.  If a telephone visit is scheduled, you may be asked to come into the office to have lab tests or other studies performed.   This visit will not be completed any greater than 14 days prior to your surgery.  If your surgery has been scheduled for a future date, please do not be alarmed if we have not contacted you to schedule an appointment more than a month prior to the surgery date.    Please be prepared to provide the following information during this appointment:            -Personal medical history                                               -Medication and allergy list            -Any history of problems with anesthesia              -Recent lab work or diagnostic studies            -Please notify us  of any needs we should be aware of to provide the best care possible           -You will be provided with instructions on how to prepare for your surgery.    On The Day of Surgery:  You must have a driver to take you home after surgery, you will be asked not to drive for 24 hours following surgery.  Taxi, Gisele and non-medical transport will not be acceptable means of transportation unless you have a responsible individual who will be traveling with you.  Visitors in the surgical area:   2 people will be able to visit you in your room once your preparation for surgery has been completed. During surgery, your visitors will be asked to wait in the Surgery Waiting Area.  It is not a requirement for them to stay, if they prefer to leave and come back.  Your visitor(s) will be given an  update once the surgery has been completed.  No visitors are allowed in the initial recovery room to respect patient privacy and safety.  Once you are more awake and transfer to the secondary recovery area, or are transferred to an inpatient room, visitors will again be able to see you.  To respect and protect your privacy: We will ask on the day of surgery who your driver will be and what the contact number for that individual will be. We will ask if it is okay to share information with this individual, or if there is an alternative individual that we, or the surgeon, should contact to provide updates and information. If family  or friends come to the surgical information desk requesting information about you, who you have not listed with us , no information will be given.   It may be helpful to designate someone as the main contact who will be responsible for updating your other friends and family.    PREADMIT TESTING OFFICE: [phone] SAME DAY SURGERY: [phone] We look forward to caring for you before and throughout the process of your surgery.

## 2024-07-08 ENCOUNTER — Ambulatory Visit (HOSPITAL_BASED_OUTPATIENT_CLINIC_OR_DEPARTMENT_OTHER): Payer: MEDICAID | Attending: Cardiology | Admitting: Cardiology

## 2024-07-08 DIAGNOSIS — G4730 Sleep apnea, unspecified: Secondary | ICD-10-CM

## 2024-07-08 DIAGNOSIS — E785 Hyperlipidemia, unspecified: Secondary | ICD-10-CM | POA: Diagnosis not present

## 2024-07-08 DIAGNOSIS — E782 Mixed hyperlipidemia: Secondary | ICD-10-CM

## 2024-07-08 DIAGNOSIS — G4733 Obstructive sleep apnea (adult) (pediatric): Secondary | ICD-10-CM | POA: Diagnosis not present

## 2024-07-08 DIAGNOSIS — I10 Essential (primary) hypertension: Secondary | ICD-10-CM | POA: Insufficient documentation

## 2024-07-08 DIAGNOSIS — R0683 Snoring: Secondary | ICD-10-CM | POA: Diagnosis present

## 2024-07-09 ENCOUNTER — Encounter (HOSPITAL_BASED_OUTPATIENT_CLINIC_OR_DEPARTMENT_OTHER): Payer: Self-pay | Admitting: Pharmacist Clinician (PhC)/ Clinical Pharmacy Specialist

## 2024-07-09 ENCOUNTER — Ambulatory Visit (INDEPENDENT_AMBULATORY_CARE_PROVIDER_SITE_OTHER): Admitting: Pharmacist Clinician (PhC)/ Clinical Pharmacy Specialist

## 2024-07-09 VITALS — BP 120/90 | HR 101 | Ht 73.0 in | Wt 260.0 lb

## 2024-07-09 DIAGNOSIS — I10 Essential (primary) hypertension: Secondary | ICD-10-CM | POA: Diagnosis not present

## 2024-07-09 MED ORDER — CARVEDILOL 12.5 MG PO TABS: 12.5000 mg | ORAL_TABLET | Freq: Two times a day (BID) | ORAL | 3 refills | Status: AC

## 2024-07-09 NOTE — Patient Instructions (Signed)
Follow up appointment: with me on December 19 at 10:30 am  Take your BP meds as follows: INCREASE CARVEDILOL  TO 12.5 MG TWICE DAILY (TAKE 2 OF THE 6.25 MG TABLETS TWICE DAILY UNTIL GONE - THEN START WITH THE 12.5 MG TABLETS)  Check your blood pressure at home daily (if able) and keep record of the readings.  Your blood pressure goal is <130/80  To check your pressure at home you will need to:  1. Sit up in a chair, with feet flat on the floor and back supported. Do not cross your ankles or legs. 2. Rest your left arm so that the cuff is about heart level. If the cuff goes on your upper arm,  then just relax the arm on the table, arm of the chair or your lap. If you have a wrist cuff, we  suggest relaxing your wrist against your chest (think of it as Pledging the Flag with the  wrong arm).  3. Place the cuff snugly around your arm, about 1 inch above the crook of your elbow. The  cords should be inside the groove of your elbow.  4. Sit quietly, with the cuff in place, for about 5 minutes. After that 5 minutes press the power  button to start a reading. 5. Do not talk or move while the reading is taking place.  6. Record your readings on a sheet of paper. Although most cuffs have a memory, it is often  easier to see a pattern developing when the numbers are all in front of you.  7. You can repeat the reading after 1-3 minutes if it is recommended  Make sure your bladder is empty and you have not had caffeine or tobacco within the last 30 min  Always bring your blood pressure log with you to your appointments. If you have not brought your monitor in to be double checked for accuracy, please bring it to your next appointment.  You can find a list of quality blood pressure cuffs at [email or web address]  Important lifestyle changes to control high blood pressure  Intervention  Effect on the BP  Lose extra pounds and watch your waistline Weight loss is one of the most effective lifestyle changes  for controlling blood pressure. If you're overweight or obese, losing even a small amount of weight can help reduce blood pressure. Blood pressure might go down by about 1 millimeter of mercury (mm Hg) with each kilogram (about 2.2 pounds) of weight lost.  Exercise regularly As a general goal, aim for at least 30 minutes of moderate physical activity every day. Regular physical activity can lower high blood pressure by about 5 to 8 mm Hg.  Eat a healthy diet Eating a diet rich in whole grains, fruits, vegetables, and low-fat dairy products and low in saturated fat and cholesterol. A healthy diet can lower high blood pressure by up to 11 mm Hg.  Reduce salt (sodium) in your diet Even a small reduction of sodium in the diet can improve heart health and reduce high blood pressure by about 5 to 6 mm Hg.  Limit alcohol One drink equals 12 ounces of beer, 5 ounces of wine, or 1.5 ounces of 80-proof liquor.  Limiting alcohol to less than one drink a day for women or two drinks a day for men can help lower blood pressure by about 4 mm Hg.   If you have any questions or concerns please use My Chart to send questions or call the office at [phone]      

## 2024-07-09 NOTE — Assessment & Plan Note (Signed)
 Assessment: BP is uncontrolled in office BP 120/90 mmHg;  above the diastlic goal (<130/80). Tolerates nifedipine , carvedilol  and spironolactone  well, without any side effects Denies SOB, palpitation, chest pain, headaches,or swelling Reiterated the importance of regular exercise and low salt diet   Plan:  Increase carvedilol  to 12.5 mg twice daily Continue taking nifedipine  xl 90 mg and spironolactone  Patient to keep record of BP readings with heart rate and report to us  at the next visit Patient to follow up with me in 2 months, Dr. Raford in 3 months  Labs ordered today:  none

## 2024-07-09 NOTE — Progress Notes (Signed)
 Office Visit    Patient Name: Jay Montgomery Date of Encounter: 07/09/2024  Primary Care Provider:  Edman Meade PEDLAR, FNP Primary Cardiologist:  Lynwood Schilling, MD  Chief Complaint    Hypertension - Advanced hypertension clinic  Past Medical History   HLD 7/25 LDL 143, (baseline > 190); on rosuvastatin  10  Depression/anxiety On lithium  - watch closely for drug interactions  tachycardia HR 100-120, even prior to starting Adderall       Allergies  Allergen Reactions   Hydrocodone  Itching   Hydroxyzine  Other (See Comments)    Dizziness, mental fogginess, nausea   Hydrocodone -Acetaminophen  Hives, Itching and Rash    History of Present Illness    Jay Montgomery is a 38 y.o. male patient who was referred to the Advanced [facility]pertension Clinic by Gloria Zarwolo NP.   He was first diagnosed in November 2024 with elevated diastolic readings, although systolic mostly in the 130's.    He was seen in June by Reche walker and subsequent labs showed no signs of hyperaldosteronism, but did have an elevated norepinephrine level.  A follow up 24 hr urine showed normal results.  He did have low Vitamin D  at 15, this is now supplemented with weekly replacement.   When I saw him in August, he had not seen much drop in diastolic readings, and added spironolactone  12.5 mg.   At follow up in September nifedipine  was increased to 90 mg daily.    Today he is in the office for follow up.  Has upcoming surgery in November to fuse 3 vertebrae.   Had a sleep study done last night in the facility - did not sleep much, so feeling off kilter today.     Blood Pressure Goal:  130/80  Current Medications: nifedipine  xl 90 daily, carvedilol  6.25 mg bid, spironolactone  12.5 mg daily  Adherence Assessment  Do you ever forget to take your medication? [] Yes [x] No  Do you ever skip doses due to side effects? [] Yes [x] No  Do you have trouble affording your medicines? [] Yes [x] No  Are you ever unable  to pick up your medication due to transportation difficulties? [] Yes [x] No   Adherence strategy: none  Previously tried:  verapamil  - hypotension   Family Hx:  father recently diagnosed with hypertension, mother deceased, early hypertension, died COPD; sister with hole in heart ; kids 9,16  Social Hx:      Tobacco: vape daily  Alcohol: no  Caffeine: sweet tea daily    Diet:    more water than other drinks; now eating less takeout and more salads (ham, cheese, lettuce, tomato) for lunch; no breakfast; dinner - variety of proteins, vegetables are corn and green beans (canned low sodium), tomatoes; notes with pain he tends to eat only once per daily  Exercise: limited by back pain;   Home BP readings:   mostly 138-140, nothing at 150 since adding spironolactone ; diastolic now always < 100 ;  Drug-drug interaction:  lithium  vs spironolactone  - The specific mechanism of this possible interaction is not clear, but any increase in lithium  concentrations may possibly be related to urinary sodium loss caused by the diuretic with subsequent changes in lithium  excretion. Any decrease in lithium  concentrations may result from impaired lithium  reabsorption.   Accessory Clinical Findings    Lab Results  Component Value Date   CREATININE 1.17 05/31/2024   BUN 12 05/31/2024   NA 138 05/31/2024   K 4.3 05/31/2024   CL 100 05/31/2024   CO2 19 (  L) 05/31/2024   Lab Results  Component Value Date   ALT 47 (H) 03/29/2024   AST 24 03/29/2024   ALKPHOS 128 (H) 03/29/2024   BILITOT 0.4 03/29/2024   Lab Results  Component Value Date   HGBA1C 5.4 03/29/2024    Screening for Secondary Hypertension:      Relevant Labs/Studies:    Latest Ref Rng & Units 05/31/2024    9:27 AM 03/29/2024    9:22 AM 03/15/2024    9:36 AM  Basic Labs  Sodium 134 - 144 mmol/L 138  140  137   Potassium 3.5 - 5.2 mmol/L 4.3  4.3  4.7   Creatinine 0.76 - 1.27 mg/dL 8.82  8.90  8.93        Latest Ref Rng & Units  03/29/2024    9:22 AM 03/15/2024    9:36 AM  Thyroid    TSH 0.450 - 4.500 uIU/mL 1.670  1.220        Latest Ref Rng & Units 03/15/2024    9:36 AM  Renin/Aldosterone   Aldosterone 0.0 - 30.0 ng/dL 63.8   Aldos/Renin Ratio 0.0 - 30.0 6.5        Latest Ref Rng & Units 03/15/2024    9:36 AM  Metanephrines/Catecholamines   Epinephrine 0.0 - 55.4 pg/mL 43.2   Norepinephrine 115 - 524 pg/mL 834   Dopamine 0.0 - 36.7 pg/mL 18.1   Metanephrines 0.0 - 88.0 pg/mL <25.0   Normetanephrines  0.0 - 210.1 pg/mL 123.4           Home Medications    Current Outpatient Medications  Medication Sig Dispense Refill   amphetamine -dextroamphetamine  (ADDERALL) 30 MG tablet Take one tablet daily. 30 tablet 0   ARIPiprazole  (ABILIFY ) 10 MG tablet Take 1 tablet (10 mg total) by mouth 2 (two) times daily. 60 tablet 2   buPROPion  (WELLBUTRIN  XL) 150 MG 24 hr tablet Take two tablets every morning. 60 tablet 2   busPIRone  (BUSPAR ) 10 MG tablet Take 1 tablet (10 mg total) by mouth 3 (three) times daily. 90 tablet 2   carvedilol  (COREG ) 12.5 MG tablet Take 1 tablet (12.5 mg total) by mouth 2 (two) times daily. 180 tablet 3   divalproex  (DEPAKOTE  ER) 500 MG 24 hr tablet Take 1 tablet (500 mg total) by mouth daily. 30 tablet 5   gabapentin  (NEURONTIN ) 300 MG capsule Take 1 capsule (300 mg total) by mouth at bedtime. 90 capsule 3   lithium  carbonate 150 MG capsule Take 1 capsule (150 mg total) by mouth at bedtime. 30 capsule 2   naloxone (NARCAN) nasal spray 4 mg/0.1 mL Place 1 spray into the nose once.     NIFEdipine  (PROCARDIA  XL/NIFEDICAL-XL) 90 MG 24 hr tablet Take 1 tablet (90 mg total) by mouth daily. 30 tablet 6   oxyCODONE  (OXY IR/ROXICODONE ) 5 MG immediate release tablet Take 5 mg by mouth every 4 (four) hours as needed.     rosuvastatin  (CRESTOR ) 10 MG tablet Take 1 tablet (10 mg total) by mouth daily. 90 tablet 3   spironolactone  (ALDACTONE ) 25 MG tablet Take 0.5 tablets (12.5 mg total) by mouth daily. 45  tablet 3   Vitamin D , Ergocalciferol , (DRISDOL ) 1.25 MG (50000 UNIT) CAPS capsule Take 1 capsule (50,000 Units total) by mouth every 7 (seven) days. 27 capsule 1   No current facility-administered medications for this visit.     Assessment & Plan   Primary hypertension Assessment: BP is uncontrolled in office BP 120/90 mmHg;  above  the diastlic goal (<130/80). Tolerates nifedipine , carvedilol  and spironolactone  well, without any side effects Denies SOB, palpitation, chest pain, headaches,or swelling Reiterated the importance of regular exercise and low salt diet   Plan:  Increase carvedilol  to 12.5 mg twice daily Continue taking nifedipine  xl 90 mg and spironolactone  Patient to keep record of BP readings with heart rate and report to us  at the next visit Patient to follow up with me in 2 months, Dr. Raford in 3 months  Labs ordered today:  none   Allean Mink PharmD CPP [facility]  [address] [place], [place] [address] [phone]

## 2024-07-17 ENCOUNTER — Telehealth: Admitting: Adult Health

## 2024-07-17 ENCOUNTER — Encounter: Payer: Self-pay | Admitting: Adult Health

## 2024-07-17 DIAGNOSIS — F319 Bipolar disorder, unspecified: Secondary | ICD-10-CM | POA: Diagnosis not present

## 2024-07-17 DIAGNOSIS — G4700 Insomnia, unspecified: Secondary | ICD-10-CM | POA: Diagnosis not present

## 2024-07-17 DIAGNOSIS — F411 Generalized anxiety disorder: Secondary | ICD-10-CM

## 2024-07-17 DIAGNOSIS — F909 Attention-deficit hyperactivity disorder, unspecified type: Secondary | ICD-10-CM

## 2024-07-17 MED ORDER — BUSPIRONE HCL 10 MG PO TABS: 10.0000 mg | ORAL_TABLET | Freq: Three times a day (TID) | ORAL | 2 refills | Status: AC

## 2024-07-17 NOTE — Procedures (Signed)
 [facility] [address] [place], [place] [address] Tel: [phone]   Fax: [phone]  Titration Interpretation  Patient Name:  Jay Montgomery, Jay Montgomery Study Date:  07/08/2024 Referring Physician:  WILBERT BIHARI [phone]) %%startinterp%% Indications for Polysomnography The patient is a 38 year old Male who is 6' 1 and weighs 250.0 lbs. His BMI equals 33.1.  A full night titration treatment study was performed.  Medications Taken:  NO MEDICAITON TAKEN.   Polysomnogram Data A full night polysomnogram recorded the standard physiologic parameters including EEG, EOG, EMG, EKG, nasal and oral airflow.  Respiratory parameters of chest and abdominal movements were recorded with Respiratory Inductance Plethysmography belts.  Oxygen saturation was recorded by pulse oximetry.   Sleep Architecture The total recording time of the polysomnogram was 448.6 minutes.  The total sleep time was 415.5 minutes.  The patient spent 2.3% of total sleep time in Stage N1, 34.1% in Stage N2, 28.8% in Stages N3, and 34.9% in REM.  Sleep latency was 4.9 minutes.  REM latency was 95.5 minutes.  Sleep Efficiency was 92.6%.  Wake after Sleep Onset time was 28.5 minutes.  Titration Summary The patient was titrated at pressures ranging from 15 cm/H20 up to 16 cm/H20.  The last pressure used in the study was 16 cm/H20.  Respiratory Events The polysomnogram revealed a presence of 0 obstructive, 10 centrals, and 0  mixed apneas resulting in an Apnea index of 1.4 events per hour.  There were 72 hypopneas (>=3% desaturation and/or arousal) resulting in an Apnea\Hypopnea Index (AHI >=3% desaturation and/or arousal) of 11.8 events per hour.  There were 19 hypopneas (>=4% desaturation) resulting in an Apnea\Hypopnea Index (AHI >=4% desaturation) of 4.2 events per hour.  There were 97 Respiratory Effort Related Arousals resulting in a RERA index of 14.0 events per hour. The Respiratory Disturbance  Index is 25.8 events per hour.  The snore index was 54.6 events per hour.  Mean oxygen saturation was 95.6%.  The lowest oxygen saturation during sleep was 90.0%.  Time spent <=88% oxygen saturation was 0 minutes.  Limb Activity There were 0 limb movements recorded.    Cardiac Summary The average pulse rate was 88.4 bpm.  The minimum pulse rate was 64.0 bpm while the maximum pulse rate was 115.0 bpm.  Cardiac rhythm was normal sinus rhythm  Diagnosis:  Obstructive sleep apnea Successful CPAP titration        Recommendations: Recommend a trial of ResMed CPAP at 16 cm H2O with heated humidity and medium ResMed AirFit F20 fullface mask  Close follow-up is necessary to ensure success with CPAP or oral appliance therapy for maximum benefit. A follow-up oximetry study on CPAP is recommended to assess the adequacy of therapy and determine the need for supplemental oxygen or the potential need for Bi-level therapy.  An arterial blood gas to determine the adequacy of baseline ventilation and oxygenation should also be considered. Healthy sleep recommendations include:  adequate nightly sleep (normal 7-9 hrs/night), avoidance of caffeine after noon and alcohol near bedtime, and maintaining a sleep environment that is cool, dark and quiet. Weight loss for overweight patients is recommended.  Even modest amounts of weight loss can significantly improve the severity of sleep apnea. Snoring recommendations include:  weight loss where appropriate, side sleeping, and avoidance of alcohol before bed. Operation of motor vehicle should be avoided when sleepy.    This study was personally reviewed and electronically signed by: BIHARI WILBERT SAUNDERS., MD Accredited Board Certified in Sleep Medicine Date/Time:  07/17/2024 5:01 PM    %%endinterp%%

## 2024-07-17 NOTE — Procedures (Signed)
  Indications for Polysomnography The patient is a 38 year old Male who is 6' 1 and weighs 250.0 lbs. His BMI equals 33.1.  A full night titration treatment study was performed.  Medications Taken:NO MEDICAITON TAKEN. Polysomnogram Data A full night polysomnogram recorded the standard physiologic parameters including EEG, EOG, EMG, EKG, nasal and oral airflow.  Respiratory parameters of chest and abdominal movements were recorded with Respiratory Inductance Plethysmography belts.   Oxygen saturation was recorded by pulse oximetry.  Sleep Architecture The total recording time of the polysomnogram was 448.6 minutes.  The total sleep time was 415.5 minutes.  The patient spent 2.3% of total sleep time in Stage N1, 34.1% in Stage N2, 28.8% in Stages N3, and 34.9% in REM.  Sleep latency was 4.9 minutes.   REM latency was 95.5 minutes.  Sleep Efficiency was 92.6%.  Wake after Sleep Onset time was 28.5 minutes.  Titration Summary The patient was titrated at pressures ranging from 15 cm/H20 up to 16 cm/H20.  The last pressure used in the study was 16 cm/H20.  Respiratory Events The polysomnogram revealed a presence of 0 obstructive, 10 centrals, and 0  mixed apneas resulting in an Apnea index of 1.4 events per hour.  There were 72 hypopneas (GreaterEqual to3% desaturation and/or arousal) resulting in an Apnea\Hypopnea Index  (AHI GreaterEqual to3% desaturation and/or arousal) of 11.8 events per hour.  There were 19 hypopneas (GreaterEqual to4% desaturation) resulting in an Apnea\Hypopnea Index (AHI GreaterEqual to4% desaturation) of 4.2 events per hour.  There were 97  Respiratory Effort Related Arousals resulting in a RERA index of 14.0 events per hour. The Respiratory Disturbance Index is 25.8 events per hour.  The snore index was 54.6 events per hour.  Mean oxygen saturation was 95.6%.  The lowest oxygen saturation during sleep was 90.0%.  Time spent LessEqual to88% oxygen saturation was 0  minutes.  Limb Activity There were 0 limb movements recorded.  Cardiac Summary The average pulse rate was 88.4 bpm.  The minimum pulse rate was 64.0 bpm while the maximum pulse rate was 115.0 bpm.  Cardiac rhythm was normal sinus rhythm  Diagnosis: Obstructive sleep apnea Successful CPAP titration        Recommendations: 1. Recommend a trial of ResMed CPAP at 16 cm H2O with heated humidity and medium ResMed AirFit F20 fullface mask 2. Close follow-up is necessary to ensure success with CPAP or oral appliance therapy for maximum benefit. 3. A follow-up oximetry study on CPAP is recommended to assess the adequacy of therapy and determine the need for supplemental oxygen or the potential need for Bi-level therapy.  An arterial blood gas to determine the adequacy of baseline ventilation and  oxygenation should also be considered. 4. Healthy sleep recommendations include:  adequate nightly sleep (normal 7-9 hrs/night), avoidance of caffeine after noon and alcohol near bedtime, and maintaining a sleep environment that is cool, dark and quiet. 5. Weight loss for overweight patients is recommended.  Even modest amounts of weight loss can significantly improve the severity of sleep apnea. 6. Snoring recommendations include:  weight loss where appropriate, side sleeping, and avoidance of alcohol before bed. 7. Operation of motor vehicle should be avoided when sleepy.    This study was personally reviewed and electronically signed by: SHLOMO WILBERT SAUNDERS., MD Accredited Board Certified in Sleep Medicine Date/Time: 07/17/2024 5:01 PM

## 2024-07-17 NOTE — Progress Notes (Signed)
 Jay Montgomery [id number] 1986-03-30 38 y.o.  Virtual Visit via Video Note  I connected with pt @ on 07/17/24 at  9:30 AM EDT by a video enabled telemedicine application and verified that I am speaking with the correct person using two identifiers.   I discussed the limitations of evaluation and management by telemedicine and the availability of in person appointments. The patient expressed understanding and agreed to proceed.  I discussed the assessment and treatment plan with the patient. The patient was provided an opportunity to ask questions and all were answered. The patient agreed with the plan and demonstrated an understanding of the instructions.   The patient was advised to call back or seek an in-person evaluation if the symptoms worsen or if the condition fails to improve as anticipated.  I provided 25 minutes of non-face-to-face time during this encounter.  The patient was located at home.  The provider was located at [facility].   Jay LOISE Sayers, NP   Subjective:   Patient ID:  Jay Montgomery is a 38 y.o. (DOB 1985/09/29) male.  Chief Complaint: No chief complaint on file.   HPI Jay Montgomery presents for follow-up of GAD, insomnia, ADHD, and BPD 1.  Working with pain management - monthly.   Describes mood today as up and down a lot with everything I have going on". Pleasant. Flat. Denies tearfulness. Mood symptoms - reports depression - feeling down with all the things I have going on. Reports upcoming neck surgery on 07/27/2024 [facility]. Reports recent disability hearing. Reports low interest and motivation - it hasn't been there. Reports increased anxiety - a lot going on right now. Reports irritability at times - I have a smart mouth at times. Reports ongoing concerns about his physical and mental health. Reports recent panic attacks - 1 since last visit. Reports difficulties leaving the house - I only leave the house for  my appointments. Reports increased worry, rumination and over thinking - my financial situation is not good - reports almost losing his home. Denies recent mania. Reports mood as variable - it's up and down. Stating I feel like I'm trying to manage with my situation. Taking medications as prescribed. Energy levels lower - I don't feel like doing anything. Active, does not have a regular exercise routine with physical disabilities. Enjoys some usual interests and activities. Married. Lives with wife and family. Appetite adequate. Weight gain - 248 to 255 pounds. Reports sleep is variable - it's all over the place. Reports recent diagnosis of sleep apnea - awaiting a CPAP machine. Averages 5 to 6 hours of broken sleep. Reports napping throughout the day - a lot. Focus and concentration not the best. Completing minimal tasks - struggles with self care. Unable to manage household tasks - family member assisting him.Reports he is unable to work with current physical limitations and medical issues. Denies SI or HI. Denies AH or VH. Denies self harm. Denies substance use.  Review of Systems:  Review of Systems  Musculoskeletal:  Negative for gait problem.  Neurological:  Negative for tremors.  Psychiatric/Behavioral:         Please refer to HPI    Medications: I have reviewed the patient's current medications.  Current Outpatient Medications  Medication Sig Dispense Refill   amphetamine -dextroamphetamine  (ADDERALL) 30 MG tablet Take one tablet daily. 30 tablet 0   ARIPiprazole  (ABILIFY ) 10 MG tablet Take 1 tablet (10 mg total) by mouth 2 (two) times daily. 60 tablet 2  buPROPion  (WELLBUTRIN  XL) 150 MG 24 hr tablet Take two tablets every morning. 60 tablet 2   busPIRone  (BUSPAR ) 10 MG tablet Take 1 tablet (10 mg total) by mouth 3 (three) times daily. 90 tablet 2   carvedilol  (COREG ) 12.5 MG tablet Take 1 tablet (12.5 mg total) by mouth 2 (two) times daily. 180 tablet 3    divalproex  (DEPAKOTE  ER) 500 MG 24 hr tablet Take 1 tablet (500 mg total) by mouth daily. 30 tablet 5   gabapentin  (NEURONTIN ) 300 MG capsule Take 1 capsule (300 mg total) by mouth at bedtime. 90 capsule 3   lithium  carbonate 150 MG capsule Take 1 capsule (150 mg total) by mouth at bedtime. 30 capsule 2   naloxone (NARCAN) nasal spray 4 mg/0.1 mL Place 1 spray into the nose once.     NIFEdipine  (PROCARDIA  XL/NIFEDICAL-XL) 90 MG 24 hr tablet Take 1 tablet (90 mg total) by mouth daily. 30 tablet 6   oxyCODONE  (OXY IR/ROXICODONE ) 5 MG immediate release tablet Take 5 mg by mouth every 4 (four) hours as needed.     rosuvastatin  (CRESTOR ) 10 MG tablet Take 1 tablet (10 mg total) by mouth daily. 90 tablet 3   spironolactone  (ALDACTONE ) 25 MG tablet Take 0.5 tablets (12.5 mg total) by mouth daily. 45 tablet 3   Vitamin D , Ergocalciferol , (DRISDOL ) 1.25 MG (50000 UNIT) CAPS capsule Take 1 capsule (50,000 Units total) by mouth every 7 (seven) days. 27 capsule 1   No current facility-administered medications for this visit.    Medication Side Effects: None  Allergies:  Allergies  Allergen Reactions   Hydrocodone  Itching   Hydroxyzine  Other (See Comments)    Dizziness, mental fogginess, nausea   Hydrocodone -Acetaminophen  Hives, Itching and Rash    Past Medical History:  Diagnosis Date   Allergy    Anxiety    Chicken pox    Depression    Headache    due to CSF leak   Heart murmur    as a child   Spinal stenosis     Family History  Problem Relation Age of Onset   Arthritis Mother    Hypertension Mother    COPD Mother    Emphysema Mother    Bipolar disorder Mother    Hypertension Father    Arthritis Father    Diabetes Father    Neuropathy Father    Bipolar disorder Sister    Schizophrenia Sister    ADD / ADHD Sister    Anxiety disorder Sister    Atrial fibrillation Sister     Social History   Socioeconomic History   Marital status: Legally Separated    Spouse name: Tasha  Jordan   Number of children: Not on file   Years of education: Not on file   Highest education level: 12th grade  Occupational History   Occupation: Designer, Jewellery  Tobacco Use   Smoking status: Former    Current packs/day: 1.00    Average packs/day: 1 pack/day for 20.0 years (20.0 ttl pk-yrs)    Types: Cigarettes   Smokeless tobacco: Never  Vaping Use   Vaping status: Some Days   Substances: Nicotine, CBD, Flavoring  Substance and Sexual Activity   Alcohol use: Not Currently    Comment: Haven't had alcohol in a year due to medication   Drug use: No    Comment: vapes w/ delta 8   Sexual activity: Yes    Partners: Female    Birth control/protection: Other-see comments    Comment:  Essure  Other Topics Concern   Not on file  Social History Narrative   Pt lives with wife    Pt not working    Social Drivers of Corporate Investment Banker Strain: Low Risk  (03/25/2024)   Overall Financial Resource Strain (CARDIA)    Difficulty of Paying Living Expenses: Not very hard  Recent Concern: Financial Resource Strain - High Risk (01/22/2024)   Overall Financial Resource Strain (CARDIA)    Difficulty of Paying Living Expenses: Very hard  Food Insecurity: Food Insecurity Present (03/27/2024)   Hunger Vital Sign    Worried About Running Out of Food in the Last Year: Sometimes true    Ran Out of Food in the Last Year: Sometimes true  Transportation Needs: No Transportation Needs (03/27/2024)   PRAPARE - Administrator, Civil Service (Medical): No    Lack of Transportation (Non-Medical): No  Physical Activity: Inactive (03/25/2024)   Exercise Vital Sign    Days of Exercise per Week: 0 days    Minutes of Exercise per Session: Not on file  Stress: Stress Concern Present (03/27/2024)   [employer] of Occupational Health - Occupational Stress Questionnaire    Feeling of Stress: Very much  Social Connections: Moderately Isolated (03/25/2024)   Social Connection and Isolation Panel     Frequency of Communication with Friends and Family: More than three times a week    Frequency of Social Gatherings with Friends and Family: Once a week    Attends Religious Services: Never    Database Administrator or Organizations: No    Attends Engineer, Structural: Not on file    Marital Status: Living with partner  Intimate Partner Violence: Not At Risk (08/10/2023)   Humiliation, Afraid, Rape, and Kick questionnaire    Fear of Current or Ex-Partner: No    Emotionally Abused: No    Physically Abused: No    Sexually Abused: No    Past Medical History, Surgical history, Social history, and Family history were reviewed and updated as appropriate.   Please see review of systems for further details on the patient's review from today.   Objective:   Physical Exam:  There were no vitals taken for this visit.  Physical Exam Constitutional:      General: He is not in acute distress. Musculoskeletal:        General: No deformity.  Neurological:     Mental Status: He is alert and oriented to person, place, and time.     Coordination: Coordination normal.  Psychiatric:        Attention and Perception: Attention and perception normal. He does not perceive auditory or visual hallucinations.        Mood and Affect: Mood normal. Mood is not anxious or depressed. Affect is not labile, blunt, angry or inappropriate.        Speech: Speech normal.        Behavior: Behavior normal.        Thought Content: Thought content normal. Thought content is not paranoid or delusional. Thought content does not include homicidal or suicidal ideation. Thought content does not include homicidal or suicidal plan.        Cognition and Memory: Cognition and memory normal.        Judgment: Judgment normal.     Comments: Insight intact     Lab Review:     Component Value Date/Time   NA 138 05/31/2024 0927   K 4.3 05/31/2024 9072  CL 100 05/31/2024 0927   CO2 19 (L) 05/31/2024 0927   GLUCOSE  109 (H) 05/31/2024 0927   GLUCOSE 104 (H) 01/26/2024 1703   BUN 12 05/31/2024 0927   CREATININE 1.17 05/31/2024 0927   CALCIUM  9.2 05/31/2024 0927   PROT 7.1 03/29/2024 0922   ALBUMIN 4.7 03/29/2024 0922   AST 24 03/29/2024 0922   ALT 47 (H) 03/29/2024 0922   ALKPHOS 128 (H) 03/29/2024 0922   BILITOT 0.4 03/29/2024 0922   GFRNONAA >60 01/26/2024 1703   GFRAA >60 06/23/2020 0620       Component Value Date/Time   WBC 11.5 (H) 03/29/2024 0922   WBC 10.9 (H) 01/26/2024 1703   RBC 5.15 03/29/2024 0922   RBC 5.66 01/26/2024 1703   HGB 15.6 03/29/2024 0922   HCT 47.5 03/29/2024 0922   PLT 290 03/29/2024 0922   MCV 92 03/29/2024 0922   MCH 30.3 03/29/2024 0922   MCH 30.4 01/26/2024 1703   MCHC 32.8 03/29/2024 0922   MCHC 33.5 01/26/2024 1703   RDW 13.4 03/29/2024 0922   LYMPHSABS 2.0 03/29/2024 0922   MONOABS 0.7 08/10/2023 0844   EOSABS 0.2 03/29/2024 0922   BASOSABS 0.1 03/29/2024 0922    No results found for: POCLITH, LITHIUM    No results found for: PHENYTOIN, PHENOBARB, VALPROATE, CBMZ   .res Assessment: Plan:    Plan:  Monitoring BP  Abilify  20mg  daily to target mood instability.  Lithium  150mg  at bedtime - to target passive SI.  Wellbutrin  XL 300mg  daily - denies seizure history. Buspar  10mg  TID Adderall 30mg  daily  ADHD outside testing - tested 13 - adult ADHD. Consider referral for psychological testing.  RTC 4 weeks  25 minutes spent dedicated to the care of this patient on the date of this encounter to include pre-visit review of records, ordering of medication, post visit documentation, and face-to-face time with the patient discussing GAD, insomnia, ADHD, and BPD 1. Discussed continuing current medication regimen.  Patient remains totally disabled and unable to work currently 05/13/2024 through 09/12/2024.  Discussed potential metabolic side effects associated with atypical antipsychotics, as well as potential risk for movement side  effects. Advised pt to contact office if movement side effects occur.   Discussed potential benefits, risk, and side effects of benzodiazepines to include potential risk of tolerance and dependence, as well as possible drowsiness.  Advised patient not to drive if experiencing drowsiness and to take lowest possible effective dose to minimize risk of dependence and tolerance.   Discussed potential benefits, risks, and side effects of stimulants with patient to include increased heart rate, palpitations, insomnia, increased anxiety, increased irritability, or decreased appetite.  Instructed patient to contact office if experiencing any significant tolerability issues.   There are no diagnoses linked to this encounter.   Please see After Visit Summary for patient specific instructions.  Future Appointments  Date Time Provider Department Center  07/17/2024  9:30 AM Danessa Mensch, Jay Mattocks, NP [facility] None  07/18/2024 10:30 AM [facility] None  08/08/2024  9:30 AM Hilma Hastings, PA-C [facility]  09/04/2024  9:15 AM [facility] DG 1 [facility]  09/04/2024  9:45 AM Clois Fret, MD [facility]  09/07/2024 10:30 AM Herschel Allean CROME, RPH-CPP [facility] [id number]  09/25/2024  1:45 PM Raford Riggs, MD [facility] [address]  10/10/2024  8:40 AM Edman, Meade PEDLAR, FNP [facility] [address]  10/16/2024  9:00 AM [facility] DG 1 [facility]  10/16/2024  9:30 AM Hilma Hastings, PA-C [facility]  10/29/2024  2:45 PM McCue, Harlene, NP [facility] None    No orders of the defined types were placed in this encounter.     -------------------------------

## 2024-07-18 ENCOUNTER — Other Ambulatory Visit: Payer: Self-pay

## 2024-07-18 ENCOUNTER — Encounter
Admission: RE | Admit: 2024-07-18 | Discharge: 2024-07-18 | Disposition: A | Discharge status: Home / Self Care | Source: Ambulatory Visit | Attending: Neurosurgery | Admitting: Neurosurgery

## 2024-07-18 VITALS — BP 119/93 | HR 96 | Temp 97.3°F | Resp 18 | Ht 72.0 in | Wt 259.6 lb

## 2024-07-18 DIAGNOSIS — Z01810 Encounter for preprocedural cardiovascular examination: Secondary | ICD-10-CM

## 2024-07-18 DIAGNOSIS — Z01818 Encounter for other preprocedural examination: Secondary | ICD-10-CM | POA: Insufficient documentation

## 2024-07-18 DIAGNOSIS — I10 Essential (primary) hypertension: Secondary | ICD-10-CM | POA: Diagnosis not present

## 2024-07-18 DIAGNOSIS — Z01812 Encounter for preprocedural laboratory examination: Secondary | ICD-10-CM

## 2024-07-18 HISTORY — DX: Abnormal levels of other serum enzymes: R74.8

## 2024-07-18 HISTORY — DX: Unspecified osteoarthritis, unspecified site: M19.90

## 2024-07-18 LAB — CBC
HCT: 43.3 % (ref 39.0–52.0)
Hemoglobin: 14.6 g/dL (ref 13.0–17.0)
MCH: 30.1 pg (ref 26.0–34.0)
MCHC: 33.7 g/dL (ref 30.0–36.0)
MCV: 89.3 fL (ref 80.0–100.0)
Platelets: 306 K/uL (ref 150–400)
RBC: 4.85 MIL/uL (ref 4.22–5.81)
RDW: 12.6 % (ref 11.5–15.5)
WBC: 9.6 K/uL (ref 4.0–10.5)
nRBC: 0 % (ref 0.0–0.2)

## 2024-07-18 LAB — SURGICAL PCR SCREEN
MRSA, PCR: NEGATIVE
Staphylococcus aureus: NEGATIVE

## 2024-07-18 LAB — TYPE AND SCREEN
ABO/RH(D): O NEG
Antibody Screen: NEGATIVE

## 2024-07-18 NOTE — Patient Instructions (Addendum)
 Your procedure is scheduled on: FRIDAY  NOVEMBER 7  Report to the Registration Desk on the 1st floor of the [facility] To find out your arrival time, please call [phone] between 1PM - 3PM on:  THURSDAY NOVEMBER 6 If your arrival time is 6:00 am, do not arrive before that time as the [facility] entrance doors do not open until 6:00 am.  REMEMBER: Instructions that are not followed completely may result in serious medical risk, up to and including death; or upon the discretion of your surgeon and anesthesiologist your surgery may need to be rescheduled.  Do not eat food after midnight the night before surgery.  No gum chewing or hard candies.   One week prior to surgery: Stop Anti-inflammatories (NSAIDS) such as Advil , Aleve , Ibuprofen , Motrin , Naproxen , Naprosyn  and Aspirin based products such as Excedrin, Goody's Powder, BC Powder. Stop ANY OVER THE COUNTER supplements until after surgery. Vitamin D    You may however, continue to take Tylenol  if needed for pain up until the day of surgery.   Continue taking all of your other prescription medications up until the day of surgery.  ON THE DAY OF SURGERY ONLY TAKE THESE MEDICATIONS WITH SIPS OF WATER:  carvedilol  (COREG )  amphetamine -dextroamphetamine  (ADDERALL)  ARIPiprazole  (ABILIFY )  buPROPion  (WELLBUTRIN  XL)  busPIRone  (BUSPAR )  NIFEdipine  (PROCARDIA  XL/NIFEDICAL-XL)    No Alcohol for 24 hours before or after surgery.  No Smoking including e-cigarettes for 24 hours before surgery.   Do not use any recreational drugs for at least a week (preferably 2 weeks) before your surgery.  Please be advised that the combination of cocaine and anesthesia may have negative outcomes, up to and including death. If you test positive for cocaine, your surgery will be cancelled.  On the morning of surgery brush your teeth with toothpaste and water, you may rinse your mouth with mouthwash if you wish. Do not swallow any toothpaste  or mouthwash.  Use CHG Soap as directed on instruction sheet.  Do not wear jewelry, make-up, hairpins, clips or nail polish.  For welded (permanent) jewelry: bracelets, anklets, waist bands, etc.  Please have this removed prior to surgery.  If it is not removed, there is a chance that hospital personnel will need to cut it off on the day of surgery.  Do not wear lotions, powders, or perfumes.   Do not shave body hair from the neck down 48 hours before surgery.  Contact lenses, hearing aids and dentures may not be worn into surgery.  Do not bring valuables to the hospital. [facility] is not responsible for any missing/lost belongings or valuables.   Notify your doctor if there is any change in your medical condition (cold, fever, infection).  Wear comfortable clothing (specific to your surgery type) to the hospital.  After surgery, you can help prevent lung complications by doing breathing exercises.  Take deep breaths and cough every 1-2 hours.   If you are being admitted to the hospital overnight, leave your suitcase in the car. After surgery it may be brought to your room.  In case of increased patient census, it may be necessary for you, the patient, to continue your postoperative care in the Same Day Surgery department.  If you are being discharged the day of surgery, you will not be allowed to drive home. You will need a responsible individual to drive you home and stay with you for 24 hours after surgery.   If you are taking public transportation, you will need  to have a responsible individual with you.  Please call the Pre-admissions Testing Dept. at [phone] if you have any questions about these instructions.  Surgery Visitation Policy:  Patients having surgery or a procedure may have two visitors.  Children under the age of 19 must have an adult with them who is not the patient.  Inpatient Visitation:    Visiting hours are 7 a.m. to 8 p.m. Up to four visitors  are allowed at one time in a patient room. The visitors may rotate out with other people during the day.  One visitor age 24 or older may stay with the patient overnight and must be in the room by 8 p.m.   Merchandiser, Retail to address health-related social needs:  [email or web address]     Pre-operative 4 CHG Bath Instructions   You can play a key role in reducing the risk of infection after surgery. Your skin needs to be as free of germs as possible. You can reduce the number of germs on your skin by washing with CHG (chlorhexidine  gluconate) soap before surgery. CHG is an antiseptic soap that kills germs and continues to kill germs even after washing.   DO NOT use if you have an allergy to chlorhexidine /CHG or antibacterial soaps. If your skin becomes reddened or irritated, stop using the CHG and notify one of our RNs at [phone].   Please shower with the CHG soap starting 4 days before surgery using the following schedule:   START MONDAY NOVEMBER 3     Please keep in mind the following:  DO NOT shave, including legs and underarms, starting the day of your first shower.   You may shave your face at any point before/day of surgery.  Place clean sheets on your bed the day you start using CHG soap. Use a clean washcloth (not used since being washed) for each shower. DO NOT sleep with pets once you start using the CHG.   CHG Shower Instructions:  If you choose to wash your hair and private area, wash first with your normal shampoo/soap.  After you use shampoo/soap, rinse your hair and body thoroughly to remove shampoo/soap residue.  Turn the water OFF and apply about 3 tablespoons (45 ml) of CHG soap to a CLEAN washcloth.  Apply CHG soap ONLY FROM YOUR NECK DOWN TO YOUR TOES (washing for 3-5 minutes)  DO NOT use CHG soap on face, private areas, open wounds, or sores.  Pay special attention to the area where your surgery is being performed.  If you are having  back surgery, having someone wash your back for you may be helpful. Wait 2 minutes after CHG soap is applied, then you may rinse off the CHG soap.  Pat dry with a clean towel  Put on clean clothes/pajamas   If you choose to wear lotion, please use ONLY the CHG-compatible lotions on the back of this paper.     Additional instructions for the day of surgery: DO NOT APPLY any lotions, deodorants, cologne, or perfumes.   Put on clean/comfortable clothes.  Brush your teeth.  Ask your nurse before applying any prescription medications to the skin.      CHG Compatible Lotions   Aveeno Moisturizing lotion  Cetaphil Moisturizing Cream  Cetaphil Moisturizing Lotion  Clairol Herbal Essence Moisturizing Lotion, Dry Skin  Clairol Herbal Essence Moisturizing Lotion, Extra Dry Skin  Clairol Herbal Essence Moisturizing Lotion, Normal Skin  Curel Age Defying Therapeutic Moisturizing Lotion with Alpha Hydroxy  Curel Extreme  Care Body Lotion  Curel Soothing Hands Moisturizing Hand Lotion  Curel Therapeutic Moisturizing Cream, Fragrance-Free  Curel Therapeutic Moisturizing Lotion, Fragrance-Free  Curel Therapeutic Moisturizing Lotion, Original Formula  Eucerin Daily Replenishing Lotion  Eucerin Dry Skin Therapy Plus Alpha Hydroxy Crme  Eucerin Dry Skin Therapy Plus Alpha Hydroxy Lotion  Eucerin Original Crme  Eucerin Original Lotion  Eucerin Plus Crme Eucerin Plus Lotion  Eucerin TriLipid Replenishing Lotion  Keri Anti-Bacterial Hand Lotion  Keri Deep Conditioning Original Lotion Dry Skin Formula Softly Scented  Keri Deep Conditioning Original Lotion, Fragrance Free Sensitive Skin Formula  Keri Lotion Fast Absorbing Fragrance Free Sensitive Skin Formula  Keri Lotion Fast Absorbing Softly Scented Dry Skin Formula  Keri Original Lotion  Keri Skin Renewal Lotion Keri Silky Smooth Lotion  Keri Silky Smooth Sensitive Skin Lotion  Nivea Body Creamy Conditioning Oil  Nivea Body Extra Enriched  Teacher, Adult Education Moisturizing Lotion Nivea Crme  Nivea Skin Firming Lotion  NutraDerm 30 Skin Lotion  NutraDerm Skin Lotion  NutraDerm Therapeutic Skin Cream  NutraDerm Therapeutic Skin Lotion  ProShield Protective Hand Cream  Provon moisturizing lotion

## 2024-07-23 ENCOUNTER — Telehealth: Payer: Self-pay | Admitting: *Deleted

## 2024-07-23 NOTE — Telephone Encounter (Signed)
 The patient has been notified of the result and verbalized understanding.  All questions (if any) were answered. Jay Montgomery, CMA 07/23/2024 12:01 PM     Upon patient request DME selection is [facility] Patient understands he will be contacted by [facility] to set up his cpap. Patient understands to call if [facility] does not contact him with new setup in a timely manner. Patient understands they will be called once confirmation has been received from [facility] that they have received their new machine to schedule 10 week follow up appointment.   [facility] notified of new cpap order  Please add to airview Patient was grateful for the call and thanked me.

## 2024-07-23 NOTE — Telephone Encounter (Signed)
-----   Message from Wilbert Bihari sent at 07/17/2024  5:02 PM EDT ----- Please let patient know that they had a successful PAP titration and let DME know that orders are in EPIC.  Please set up 6 week OV with me.

## 2024-07-26 MED ORDER — ORAL CARE MOUTH RINSE: 15.0000 mL | Freq: Once | OROMUCOSAL | Status: AC

## 2024-07-26 MED ORDER — CHLORHEXIDINE GLUCONATE 0.12 % MT SOLN
15.0000 mL | Freq: Once | OROMUCOSAL | Status: AC
  Administered 2024-07-27: 15 mL via OROMUCOSAL

## 2024-07-26 MED ORDER — LACTATED RINGERS IV SOLN: INTRAVENOUS | Status: DC

## 2024-07-27 ENCOUNTER — Ambulatory Visit: Payer: Self-pay | Admitting: Urgent Care

## 2024-07-27 ENCOUNTER — Encounter: Payer: Self-pay | Admitting: Neurosurgery

## 2024-07-27 ENCOUNTER — Other Ambulatory Visit: Payer: Self-pay

## 2024-07-27 ENCOUNTER — Encounter
Admission: RE | Disposition: A | Discharge status: Home / Self Care | Payer: Self-pay | Source: Home / Self Care | Attending: Neurosurgery

## 2024-07-27 ENCOUNTER — Ambulatory Visit

## 2024-07-27 ENCOUNTER — Ambulatory Visit
Admission: RE | Admit: 2024-07-27 | Discharge: 2024-07-29 | Disposition: A | Discharge status: Home / Self Care | Payer: MEDICAID | Attending: Neurosurgery | Admitting: Neurosurgery

## 2024-07-27 DIAGNOSIS — G9589 Other specified diseases of spinal cord: Secondary | ICD-10-CM

## 2024-07-27 DIAGNOSIS — G959 Disease of spinal cord, unspecified: Secondary | ICD-10-CM | POA: Diagnosis not present

## 2024-07-27 DIAGNOSIS — I10 Essential (primary) hypertension: Secondary | ICD-10-CM | POA: Diagnosis not present

## 2024-07-27 DIAGNOSIS — M4802 Spinal stenosis, cervical region: Secondary | ICD-10-CM

## 2024-07-27 DIAGNOSIS — Z01818 Encounter for other preprocedural examination: Secondary | ICD-10-CM

## 2024-07-27 DIAGNOSIS — M5412 Radiculopathy, cervical region: Secondary | ICD-10-CM | POA: Diagnosis present

## 2024-07-27 DIAGNOSIS — F418 Other specified anxiety disorders: Secondary | ICD-10-CM | POA: Diagnosis not present

## 2024-07-27 DIAGNOSIS — Z87891 Personal history of nicotine dependence: Secondary | ICD-10-CM | POA: Insufficient documentation

## 2024-07-27 DIAGNOSIS — Z981 Arthrodesis status: Secondary | ICD-10-CM | POA: Diagnosis not present

## 2024-07-27 HISTORY — PX: ANTERIOR CERVICAL DECOMP/DISCECTOMY FUSION: SHX1161

## 2024-07-27 LAB — ABO/RH: ABO/RH(D): O NEG

## 2024-07-27 SURGERY — ANTERIOR CERVICAL DECOMPRESSION/DISCECTOMY FUSION 3 LEVELS
Anesthesia: General

## 2024-07-27 MED ORDER — LITHIUM CARBONATE 150 MG PO CAPS
150.0000 mg | ORAL_CAPSULE | Freq: Every day | ORAL | Status: DC
  Administered 2024-07-27 – 2024-07-28 (×2): 150 mg via ORAL
  Filled 2024-07-27 (×4): qty 1

## 2024-07-27 MED ORDER — AMPHETAMINE-DEXTROAMPHETAMINE 10 MG PO TABS
30.0000 mg | ORAL_TABLET | Freq: Every day | ORAL | Status: DC
  Administered 2024-07-27 – 2024-07-29 (×3): 30 mg via ORAL
  Filled 2024-07-27 (×4): qty 3

## 2024-07-27 MED ORDER — PHENOL 1.4 % MT LIQD: 1.0000 | OROMUCOSAL | Status: DC | PRN

## 2024-07-27 MED ORDER — DOCUSATE SODIUM 100 MG PO CAPS
100.0000 mg | ORAL_CAPSULE | Freq: Two times a day (BID) | ORAL | Status: DC
  Administered 2024-07-27 – 2024-07-29 (×5): 100 mg via ORAL
  Filled 2024-07-27 (×5): qty 1

## 2024-07-27 MED ORDER — ACETAMINOPHEN 500 MG PO TABS
1000.0000 mg | ORAL_TABLET | Freq: Four times a day (QID) | ORAL | 0 refills | Status: DC | PRN
  Filled 2024-07-27: qty 30, 4d supply, fill #0

## 2024-07-27 MED ORDER — ENOXAPARIN SODIUM 40 MG/0.4ML IJ SOSY
40.0000 mg | PREFILLED_SYRINGE | INTRAMUSCULAR | Status: DC
  Administered 2024-07-28 – 2024-07-29 (×2): 40 mg via SUBCUTANEOUS
  Filled 2024-07-27 (×2): qty 0.4

## 2024-07-27 MED ORDER — FENTANYL CITRATE (PF) 100 MCG/2ML IJ SOLN
INTRAMUSCULAR | Status: AC
  Filled 2024-07-27: qty 2

## 2024-07-27 MED ORDER — MIDAZOLAM HCL (PF) 2 MG/2ML IJ SOLN
INTRAMUSCULAR | Status: DC | PRN
  Administered 2024-07-27: 2 mg via INTRAVENOUS

## 2024-07-27 MED ORDER — REMIFENTANIL HCL 1 MG IV SOLR
INTRAVENOUS | Status: AC
  Filled 2024-07-27: qty 1000

## 2024-07-27 MED ORDER — METHOCARBAMOL 1000 MG/10ML IJ SOLN
500.0000 mg | Freq: Four times a day (QID) | INTRAMUSCULAR | Status: DC | PRN
  Administered 2024-07-27: 500 mg via INTRAVENOUS

## 2024-07-27 MED ORDER — CEFAZOLIN SODIUM-DEXTROSE 2-4 GM/100ML-% IV SOLN
2.0000 g | Freq: Once | INTRAVENOUS | Status: AC
  Administered 2024-07-27: 2 g via INTRAVENOUS

## 2024-07-27 MED ORDER — GABAPENTIN 300 MG PO CAPS
300.0000 mg | ORAL_CAPSULE | Freq: Every day | ORAL | Status: DC
  Administered 2024-07-27 – 2024-07-28 (×2): 300 mg via ORAL
  Filled 2024-07-27 (×2): qty 1

## 2024-07-27 MED ORDER — LIDOCAINE HCL (PF) 2 % IJ SOLN
INTRAMUSCULAR | Status: AC
  Filled 2024-07-27: qty 5

## 2024-07-27 MED ORDER — ALBUTEROL SULFATE HFA 108 (90 BASE) MCG/ACT IN AERS
INHALATION_SPRAY | RESPIRATORY_TRACT | Status: DC | PRN
  Administered 2024-07-27 (×2): 5 via RESPIRATORY_TRACT

## 2024-07-27 MED ORDER — BUPIVACAINE-EPINEPHRINE (PF) 0.5% -1:200000 IJ SOLN
INTRAMUSCULAR | Status: DC | PRN
  Administered 2024-07-27: 6 mL via PERINEURAL

## 2024-07-27 MED ORDER — METHOCARBAMOL 1000 MG/10ML IJ SOLN
INTRAMUSCULAR | Status: AC
Start: 2024-07-27 — End: 2024-07-27
  Filled 2024-07-27: qty 10

## 2024-07-27 MED ORDER — CARVEDILOL 12.5 MG PO TABS
12.5000 mg | ORAL_TABLET | Freq: Two times a day (BID) | ORAL | Status: DC
  Administered 2024-07-27 – 2024-07-29 (×4): 12.5 mg via ORAL
  Filled 2024-07-27 (×4): qty 1

## 2024-07-27 MED ORDER — BISACODYL 5 MG PO TBEC: 5.0000 mg | DELAYED_RELEASE_TABLET | Freq: Every day | ORAL | Status: DC | PRN

## 2024-07-27 MED ORDER — REMIFENTANIL HCL 1 MG IV SOLR
INTRAVENOUS | Status: DC | PRN
  Administered 2024-07-27: .05 ug/kg/min via INTRAVENOUS

## 2024-07-27 MED ORDER — MENTHOL 3 MG MT LOZG: 1.0000 | LOZENGE | OROMUCOSAL | Status: DC | PRN

## 2024-07-27 MED ORDER — SUCCINYLCHOLINE CHLORIDE 200 MG/10ML IV SOSY
PREFILLED_SYRINGE | INTRAVENOUS | Status: AC
  Filled 2024-07-27: qty 10

## 2024-07-27 MED ORDER — FENTANYL CITRATE (PF) 100 MCG/2ML IJ SOLN
INTRAMUSCULAR | Status: DC | PRN
  Administered 2024-07-27 (×2): 50 ug via INTRAVENOUS

## 2024-07-27 MED ORDER — LIDOCAINE HCL (CARDIAC) PF 100 MG/5ML IV SOSY
PREFILLED_SYRINGE | INTRAVENOUS | Status: DC | PRN
  Administered 2024-07-27: 100 mg via INTRAVENOUS

## 2024-07-27 MED ORDER — ONDANSETRON HCL 4 MG/2ML IJ SOLN
INTRAMUSCULAR | Status: DC | PRN
  Administered 2024-07-27: 4 mg via INTRAVENOUS

## 2024-07-27 MED ORDER — OXYCODONE HCL 5 MG PO TABS
10.0000 mg | ORAL_TABLET | ORAL | Status: DC | PRN
  Administered 2024-07-27 – 2024-07-29 (×10): 10 mg via ORAL
  Filled 2024-07-27 (×10): qty 2

## 2024-07-27 MED ORDER — ARIPIPRAZOLE 2 MG PO TABS
10.0000 mg | ORAL_TABLET | Freq: Two times a day (BID) | ORAL | Status: DC
  Administered 2024-07-27 – 2024-07-29 (×4): 10 mg via ORAL
  Filled 2024-07-27 (×2): qty 5
  Filled 2024-07-27: qty 1
  Filled 2024-07-27 (×2): qty 5

## 2024-07-27 MED ORDER — METHOCARBAMOL 500 MG PO TABS
500.0000 mg | ORAL_TABLET | Freq: Four times a day (QID) | ORAL | Status: DC | PRN
  Administered 2024-07-27 – 2024-07-28 (×2): 500 mg via ORAL
  Filled 2024-07-27 (×2): qty 1

## 2024-07-27 MED ORDER — HYDROMORPHONE HCL 1 MG/ML IJ SOLN
1.0000 mg | INTRAMUSCULAR | Status: DC | PRN
  Administered 2024-07-27 – 2024-07-29 (×10): 1 mg via INTRAVENOUS
  Filled 2024-07-27 (×10): qty 1

## 2024-07-27 MED ORDER — SPIRONOLACTONE 12.5 MG HALF TABLET
12.5000 mg | ORAL_TABLET | Freq: Every day | ORAL | Status: DC
  Administered 2024-07-28 – 2024-07-29 (×2): 12.5 mg via ORAL
  Filled 2024-07-27 (×2): qty 1

## 2024-07-27 MED ORDER — NALOXONE HCL 4 MG/0.1ML NA LIQD
1.0000 | Freq: Once | NASAL | Status: DC | PRN
Start: 2024-07-27 — End: 2024-07-29

## 2024-07-27 MED ORDER — PROPOFOL 1000 MG/100ML IV EMUL
INTRAVENOUS | Status: AC
  Filled 2024-07-27: qty 100

## 2024-07-27 MED ORDER — PROPOFOL 10 MG/ML IV BOLUS
INTRAVENOUS | Status: DC | PRN
  Administered 2024-07-27: 50 mg via INTRAVENOUS
  Administered 2024-07-27: 130 ug/kg/min via INTRAVENOUS
  Administered 2024-07-27: 150 mg via INTRAVENOUS

## 2024-07-27 MED ORDER — DEXMEDETOMIDINE HCL IN NACL 80 MCG/20ML IV SOLN
INTRAVENOUS | Status: AC
  Filled 2024-07-27: qty 20

## 2024-07-27 MED ORDER — BUPROPION HCL ER (XL) 150 MG PO TB24
300.0000 mg | ORAL_TABLET | Freq: Every day | ORAL | Status: DC
  Administered 2024-07-28 – 2024-07-29 (×2): 300 mg via ORAL
  Filled 2024-07-27: qty 2
  Filled 2024-07-27: qty 1
  Filled 2024-07-27: qty 2

## 2024-07-27 MED ORDER — SODIUM CHLORIDE 0.9% FLUSH
3.0000 mL | INTRAVENOUS | Status: DC | PRN
Start: 2024-07-27 — End: 2024-07-29

## 2024-07-27 MED ORDER — ACETAMINOPHEN 650 MG RE SUPP: 650.0000 mg | RECTAL | Status: DC | PRN

## 2024-07-27 MED ORDER — MAGNESIUM CITRATE PO SOLN: 1.0000 | Freq: Once | ORAL | Status: DC | PRN

## 2024-07-27 MED ORDER — CHLORHEXIDINE GLUCONATE 0.12 % MT SOLN
OROMUCOSAL | Status: AC
  Filled 2024-07-27: qty 15

## 2024-07-27 MED ORDER — SODIUM CHLORIDE 0.9% FLUSH
3.0000 mL | Freq: Two times a day (BID) | INTRAVENOUS | Status: DC
  Administered 2024-07-27 – 2024-07-29 (×4): 3 mL via INTRAVENOUS

## 2024-07-27 MED ORDER — BUPIVACAINE-EPINEPHRINE (PF) 0.5% -1:200000 IJ SOLN
INTRAMUSCULAR | Status: AC
  Filled 2024-07-27: qty 20

## 2024-07-27 MED ORDER — ROSUVASTATIN CALCIUM 10 MG PO TABS
10.0000 mg | ORAL_TABLET | Freq: Every day | ORAL | Status: DC
  Administered 2024-07-27 – 2024-07-29 (×3): 10 mg via ORAL
  Filled 2024-07-27 (×3): qty 1

## 2024-07-27 MED ORDER — SENNA 8.6 MG PO TABS
1.0000 | ORAL_TABLET | Freq: Every day | ORAL | 0 refills | Status: AC
  Filled 2024-07-27: qty 100, 100d supply, fill #0

## 2024-07-27 MED ORDER — SURGIFLO WITH THROMBIN (HEMOSTATIC MATRIX KIT) OPTIME
TOPICAL | Status: DC | PRN
  Administered 2024-07-27 (×2): 1 via TOPICAL

## 2024-07-27 MED ORDER — OXYCODONE HCL 5 MG PO TABS
5.0000 mg | ORAL_TABLET | ORAL | 0 refills | Status: DC | PRN
  Filled 2024-07-27: qty 40, 4d supply, fill #0

## 2024-07-27 MED ORDER — CEFAZOLIN SODIUM-DEXTROSE 2-4 GM/100ML-% IV SOLN
INTRAVENOUS | Status: AC
  Filled 2024-07-27: qty 100

## 2024-07-27 MED ORDER — OXYCODONE HCL 5 MG PO TABS: 5.0000 mg | ORAL_TABLET | Freq: Once | ORAL | Status: DC | PRN

## 2024-07-27 MED ORDER — SODIUM CHLORIDE 0.9 % IV SOLN: 250.0000 mL | INTRAVENOUS | Status: AC

## 2024-07-27 MED ORDER — POLYETHYLENE GLYCOL 3350 17 G PO PACK: 17.0000 g | PACK | Freq: Every day | ORAL | Status: DC | PRN

## 2024-07-27 MED ORDER — DEXMEDETOMIDINE HCL IN NACL 80 MCG/20ML IV SOLN
INTRAVENOUS | Status: DC | PRN
  Administered 2024-07-27: 8 ug via INTRAVENOUS

## 2024-07-27 MED ORDER — 0.9 % SODIUM CHLORIDE (POUR BTL) OPTIME
TOPICAL | Status: DC | PRN
  Administered 2024-07-27: 500 mL

## 2024-07-27 MED ORDER — SUCCINYLCHOLINE CHLORIDE 200 MG/10ML IV SOSY
PREFILLED_SYRINGE | INTRAVENOUS | Status: DC | PRN
  Administered 2024-07-27: 100 mg via INTRAVENOUS

## 2024-07-27 MED ORDER — DOCUSATE SODIUM 100 MG PO CAPS
100.0000 mg | ORAL_CAPSULE | Freq: Two times a day (BID) | ORAL | 0 refills | Status: AC
  Filled 2024-07-27: qty 10, 5d supply, fill #0

## 2024-07-27 MED ORDER — OXYCODONE HCL 5 MG PO TABS
5.0000 mg | ORAL_TABLET | ORAL | Status: DC | PRN
  Administered 2024-07-28: 5 mg via ORAL
  Filled 2024-07-27 (×2): qty 1

## 2024-07-27 MED ORDER — DIVALPROEX SODIUM ER 250 MG PO TB24
500.0000 mg | ORAL_TABLET | Freq: Every day | ORAL | Status: DC
  Administered 2024-07-28 – 2024-07-29 (×2): 500 mg via ORAL
  Filled 2024-07-27: qty 1
  Filled 2024-07-27 (×2): qty 2

## 2024-07-27 MED ORDER — OXYCODONE HCL 5 MG/5ML PO SOLN: 5.0000 mg | Freq: Once | ORAL | Status: DC | PRN

## 2024-07-27 MED ORDER — ONDANSETRON HCL 4 MG/2ML IJ SOLN
INTRAMUSCULAR | Status: AC
  Filled 2024-07-27: qty 2

## 2024-07-27 MED ORDER — ACETAMINOPHEN 500 MG PO TABS
1000.0000 mg | ORAL_TABLET | Freq: Four times a day (QID) | ORAL | Status: AC
  Administered 2024-07-27 – 2024-07-28 (×4): 1000 mg via ORAL
  Filled 2024-07-27 (×4): qty 2

## 2024-07-27 MED ORDER — NIFEDIPINE ER OSMOTIC RELEASE 30 MG PO TB24
90.0000 mg | ORAL_TABLET | Freq: Every day | ORAL | Status: DC
  Administered 2024-07-28 – 2024-07-29 (×2): 90 mg via ORAL
  Filled 2024-07-27 (×2): qty 1

## 2024-07-27 MED ORDER — BUSPIRONE HCL 10 MG PO TABS
10.0000 mg | ORAL_TABLET | Freq: Three times a day (TID) | ORAL | Status: DC
  Administered 2024-07-27 – 2024-07-29 (×6): 10 mg via ORAL
  Filled 2024-07-27 (×7): qty 1

## 2024-07-27 MED ORDER — MIDAZOLAM HCL 2 MG/2ML IJ SOLN
INTRAMUSCULAR | Status: AC
  Filled 2024-07-27: qty 2

## 2024-07-27 MED ORDER — ONDANSETRON HCL 4 MG/2ML IJ SOLN: 4.0000 mg | Freq: Four times a day (QID) | INTRAMUSCULAR | Status: DC | PRN

## 2024-07-27 MED ORDER — VITAMIN D (ERGOCALCIFEROL) 1.25 MG (50000 UNIT) PO CAPS
50000.0000 [IU] | ORAL_CAPSULE | ORAL | Status: DC
  Administered 2024-07-28: 50000 [IU] via ORAL
  Filled 2024-07-27 (×2): qty 1

## 2024-07-27 MED ORDER — ONDANSETRON HCL 4 MG PO TABS: 4.0000 mg | ORAL_TABLET | Freq: Four times a day (QID) | ORAL | Status: DC | PRN

## 2024-07-27 MED ORDER — ALUM & MAG HYDROXIDE-SIMETH 200-200-20 MG/5ML PO SUSP: 30.0000 mL | Freq: Four times a day (QID) | ORAL | Status: DC | PRN

## 2024-07-27 MED ORDER — SENNA 8.6 MG PO TABS
1.0000 | ORAL_TABLET | Freq: Two times a day (BID) | ORAL | Status: DC
  Administered 2024-07-27 – 2024-07-29 (×5): 8.6 mg via ORAL
  Filled 2024-07-27 (×5): qty 1

## 2024-07-27 MED ORDER — ACETAMINOPHEN 325 MG PO TABS: 650.0000 mg | ORAL_TABLET | ORAL | Status: DC | PRN

## 2024-07-27 MED ORDER — DEXAMETHASONE SOD PHOSPHATE PF 10 MG/ML IJ SOLN
INTRAMUSCULAR | Status: DC | PRN
  Administered 2024-07-27: 10 mg via INTRAVENOUS

## 2024-07-27 MED ORDER — FENTANYL CITRATE (PF) 100 MCG/2ML IJ SOLN
25.0000 ug | INTRAMUSCULAR | Status: DC | PRN
  Administered 2024-07-27 (×2): 50 ug via INTRAVENOUS

## 2024-07-27 MED ORDER — METHOCARBAMOL 500 MG PO TABS
500.0000 mg | ORAL_TABLET | Freq: Three times a day (TID) | ORAL | 2 refills | Status: AC | PRN
  Filled 2024-07-27: qty 60, 20d supply, fill #0

## 2024-07-27 SURGICAL SUPPLY — 37 items
ALLOGRAFT BONE FIBER KORE 1CC (Bone Implant) IMPLANT
BASIN KIT SINGLE STR (MISCELLANEOUS) ×1 IMPLANT
BUR NEURO DRILL SOFT 3.0X3.8M (BURR) ×1 IMPLANT
DERMABOND ADVANCED .7 DNX12 (GAUZE/BANDAGES/DRESSINGS) ×1 IMPLANT
DRAIN CHANNEL JP 10F RND 20C F (MISCELLANEOUS) IMPLANT
DRAPE C ARM PK CFD 31 SPINE (DRAPES) ×1 IMPLANT
DRAPE LAPAROTOMY 77X122 PED (DRAPES) ×1 IMPLANT
DRAPE SPINE LEICA/WILD 54X150 (DRAPES) ×1 IMPLANT
DRSG TEGADERM 4X4.75 (GAUZE/BANDAGES/DRESSINGS) IMPLANT
ELECTRODE REM PT RTRN 9FT ADLT (ELECTROSURGICAL) ×1 IMPLANT
EVACUATOR SILICONE 100CC (DRAIN) IMPLANT
FEE INTRAOP CADWELL SUPPLY NCS (MISCELLANEOUS) IMPLANT
FEE INTRAOP MONITOR IMPULS NCS (MISCELLANEOUS) IMPLANT
GAUZE SPONGE 2X2 STRL 8-PLY (GAUZE/BANDAGES/DRESSINGS) IMPLANT
GLOVE BIOGEL PI IND STRL 6.5 (GLOVE) ×1 IMPLANT
GLOVE SURG SYN 6.5 PF PI (GLOVE) ×1 IMPLANT
GLOVE SURG SYN 8.5 PF PI (GLOVE) ×3 IMPLANT
GOWN SRG LRG LVL 4 IMPRV REINF (GOWNS) ×1 IMPLANT
GOWN SRG XL LVL 3 NONREINFORCE (GOWNS) ×1 IMPLANT
KIT TURNOVER KIT A (KITS) ×1 IMPLANT
MANIFOLD NEPTUNE II (INSTRUMENTS) ×1 IMPLANT
NS IRRIG 500ML POUR BTL (IV SOLUTION) ×1 IMPLANT
PACK LAMINECTOMY ARMC (PACKS) ×1 IMPLANT
PAD ARMBOARD POSITIONER FOAM (MISCELLANEOUS) ×2 IMPLANT
PIN CASPAR 14 (PIN) ×1 IMPLANT
PLATE ACP 1.9X58 3LVL (Plate) IMPLANT
SCREW ACP VSD 3.5X19 (Screw) IMPLANT
SPACER HEDRON C 12X14X8 7D (Spacer) IMPLANT
SPACER HEDRON C 12X14X9 7D (Spacer) IMPLANT
SPONGE KITTNER 5P (MISCELLANEOUS) ×1 IMPLANT
SURGIFLO W/THROMBIN 8M KIT (HEMOSTASIS) ×1 IMPLANT
SUT STRATA 3-0 15 PS-2 (SUTURE) ×1 IMPLANT
SUT VIC AB 3-0 SH 8-18 (SUTURE) ×1 IMPLANT
SUTURE EHLN 3-0 FS-10 30 BLK (SUTURE) IMPLANT
SYR 20ML LL LF (SYRINGE) ×1 IMPLANT
TAPE CLOTH 3X10 WHT NS LF (GAUZE/BANDAGES/DRESSINGS) ×2 IMPLANT
TRAP FLUID SMOKE EVACUATOR (MISCELLANEOUS) ×1 IMPLANT

## 2024-07-27 NOTE — Discharge Instructions (Signed)
 Your surgeon has performed an operation on your cervical spine (neck) to relieve pressure on the spinal cord and/or nerves. This involved making an incision in the front of your neck and removing one or more of the discs that support your spine. Next, a small piece of bone, a titanium plate, and screws were used to fuse two or more of the vertebrae (bones) together.  The following are instructions to help in your recovery once you have been discharged from the hospital. Even if you feel well, it is important that you follow these activity guidelines. If you do not let your neck heal properly from the surgery, you can increase the chance of return of your symptoms and other complications.  * Do not take anti-inflammatory medications for 3 months after surgery (naproxen [Aleve], ibuprofen  [Advil , Motrin ], celecoxib [Celebrex], etc.). These medications can prevent your bones from healing properly.  Activity    No bending, lifting, or twisting ("BLT"). Avoid lifting objects heavier than 10 pounds (gallon milk jug).  Where possible, avoid household activities that involve lifting, bending, reaching, pushing, or pulling such as laundry, vacuuming, grocery shopping, and childcare. Try to arrange for help from friends and family for these activities while your back heals.  Increase physical activity slowly as tolerated.  Taking short walks is encouraged, but avoid strenuous exercise. Do not jog, run, bicycle, lift weights, or participate in any other exercises unless specifically allowed by your doctor.  Talk to your doctor before resuming sexual activity.  You should not drive until cleared by your doctor.  Until released by your doctor, you should not return to work or school.  You should rest at home and let your body heal.   You may shower three days after your surgery.  After showering, lightly dab your incision dry. Do not take a tub bath or go swimming until approved by your doctor at your  follow-up appointment.  If your doctor ordered a cervical collar (neck brace) for you, you should wear it whenever you are out of bed. You may remove it when lying down or sleeping, but you should wear it at all other times. Not all neck surgeries require a cervical collar.  If you smoke, we strongly recommend that you quit.  Smoking has been proven to interfere with normal bone healing and will dramatically reduce the success rate of your surgery. Please contact [facility] (800-QUIT-NOW) and use the resources at www.[facility].com for assistance in stopping smoking.  Surgical Incision   If you have a dressing on your incision, you may remove it two days after your surgery. Keep your incision area clean and dry.  If you have staples or stitches on your incision, you should have a follow up scheduled for removal. If you do not have staples or stitches, you will have steri-strips (small pieces of surgical tape) or Dermabond glue. The steri-strips/glue should begin to peel away within about a week (it is fine if the steri-strips fall off before then). If the strips are still in place one week after your surgery, you may gently remove them.  Diet           You may return to your usual diet. However, you may experience discomfort when swallowing in the first month after your surgery. This is normal. You may find that softer foods are more comfortable for you to swallow. Be sure to stay hydrated.  You have been prescribed narcotic pain medications.  This often will cause constipation along with the anesthesia  that you underwent.  Please obtain Colace and senna. This has been sent to your local pharmacy, but at times it is not covered by insurance and you may obtain it over the counter..  You should take a stool softener and laxative for the duration of you taking the narcotic pain medications.  When to Contact Us   You may experience pain in your neck and/or pain between your shoulder blades. This is  normal and should improve in the next few weeks with the help of pain medication, muscle relaxers, and rest. Some patients report that a warm compress on the back of the neck or between the shoulder blades helps.  However, should you experience any of the following, contact us  immediately: New numbness or weakness Pain that is progressively getting worse, and is not relieved by your pain medication, muscle relaxers, rest, and warm compresses Bleeding, redness, swelling, pain, or drainage from surgical incision Chills or flu-like symptoms Fever greater than 101.0 F (38.3 C) Inability to eat, drink fluids, or take medications Problems with bowel or bladder functions Difficulty breathing or shortness of breath Warmth, tenderness, or swelling in your calf Contact Information How to contact us :  If you have any questions/concerns before or after surgery, you can reach us  at [phone], or you can send a mychart message. We can be reached by phone or mychart 8am-4pm, Monday-Friday.  *Please note: Calls after 4pm are forwarded to a third party answering service. Mychart messages are not routinely monitored during evenings, weekends, and holidays. Please call our office to contact the answering service for urgent concerns during non-business hours.

## 2024-07-27 NOTE — Plan of Care (Signed)

## 2024-07-27 NOTE — Anesthesia Preprocedure Evaluation (Signed)
 Anesthesia Evaluation  Patient identified by MRN, date of birth, ID band Patient awake    Reviewed: Allergy & Precautions, NPO status , Patient's Chart, lab work & pertinent test results  Airway Mallampati: II  TM Distance: >3 FB Neck ROM: Full    Dental no notable dental hx. (+) Chipped   Pulmonary neg pulmonary ROS, Patient abstained from smoking., former smoker   Pulmonary exam normal breath sounds clear to auscultation       Cardiovascular hypertension, On Medications Normal cardiovascular exam+ Valvular Problems/Murmurs  Rhythm:Regular Rate:Normal     Neuro/Psych  PSYCHIATRIC DISORDERS Anxiety Depression Bipolar Disorder    Neuromuscular disease  negative psych ROS   GI/Hepatic negative GI ROS, Neg liver ROS,,,  Endo/Other  negative endocrine ROS    Renal/GU      Musculoskeletal   Abdominal   Peds  Hematology negative hematology ROS (+)   Anesthesia Other Findings Past Medical History: No date: Allergy No date: Anxiety No date: Arthritis No date: Chicken pox No date: Depression No date: Elevated liver enzymes No date: Headache     Comment:  due to CSF leak No date: Heart murmur     Comment:  as a child No date: Spinal stenosis  Past Surgical History: No date: ADENOIDECTOMY 08/17/2017: BACK SURGERY     Comment:  L4-L5 12/01/2017: LUMBAR LAMINECTOMY/DECOMPRESSION MICRODISCECTOMY; N/A     Comment:  Procedure: REPAIR OF CEREBROSPINAL FLUID LEAK and               Placement of Lumbar Drain;  Surgeon: Louis Shove, MD;                Location: [facility] OR;  Service: Neurosurgery;  Laterality:               N/A; 07-2017: SPINE SURGERY     Comment:  2 back surgeries No date: TONSILLECTOMY  BMI    Body Mass Index: 34.25 kg/m      Reproductive/Obstetrics negative OB ROS                              Anesthesia Physical Anesthesia Plan  ASA: 2  Anesthesia Plan: General ETT    Post-op Pain Management:    Induction: Intravenous  PONV Risk Score and Plan: 2 and Ondansetron , Dexamethasone  and Midazolam   Airway Management Planned: Oral ETT  Additional Equipment:   Intra-op Plan:   Post-operative Plan: Extubation in OR  Informed Consent: I have reviewed the patients History and Physical, chart, labs and discussed the procedure including the risks, benefits and alternatives for the proposed anesthesia with the patient or authorized representative who has indicated his/her understanding and acceptance.     Dental Advisory Given  Plan Discussed with: Anesthesiologist, CRNA and Surgeon  Anesthesia Plan Comments: (Patient consented for risks of anesthesia including but not limited to:  - adverse reactions to medications - damage to eyes, teeth, lips or other oral mucosa - nerve damage due to positioning  - sore throat or hoarseness - Damage to heart, brain, nerves, lungs, other parts of body or loss of life  Patient voiced understanding and assent.)        Anesthesia Quick Evaluation

## 2024-07-27 NOTE — Transfer of Care (Signed)
 Immediate Anesthesia Transfer of Care Note  Patient: Jay Montgomery  Procedure(s) Performed: ANTERIOR CERVICAL DECOMPRESSION/DISCECTOMY FUSION 3 LEVELS  Patient Location: PACU  Anesthesia Type:General  Level of Consciousness: sedated  Airway & Oxygen Therapy: Patient Spontanous Breathing and Patient connected to face mask oxygen  Post-op Assessment: Report given to RN  Post vital signs: Reviewed and stable  Last Vitals:  Vitals Value Taken Time  BP 131/103 07/27/24 10:18  Temp    Pulse 82 07/27/24 10:21  Resp 23 07/27/24 10:21  SpO2 93 % 07/27/24 10:21  Vitals shown include unfiled device data.  Last Pain:  Vitals:   07/27/24 0624  TempSrc: Temporal  PainSc: 7          Complications: No notable events documented.

## 2024-07-27 NOTE — Anesthesia Procedure Notes (Signed)
 Procedure Name: Intubation Date/Time: 07/27/2024 7:23 AM  Performed by: Veronica Alm BROCKS, CRNAPre-anesthesia Checklist: Patient identified, Patient being monitored, Timeout performed, Emergency Drugs available and Suction available Patient Re-evaluated:Patient Re-evaluated prior to induction Oxygen Delivery Method: Circle system utilized Preoxygenation: Pre-oxygenation with 100% oxygen Induction Type: IV induction Ventilation: Mask ventilation without difficulty Laryngoscope Size: McGrath and 4 Grade View: Grade II Tube type: Oral Tube size: 7.5 mm Number of attempts: 1 Airway Equipment and Method: Stylet Placement Confirmation: ETT inserted through vocal cords under direct vision, positive ETCO2 and breath sounds checked- equal and bilateral Secured at: 21 cm Tube secured with: Tape Dental Injury: Teeth and Oropharynx as per pre-operative assessment

## 2024-07-27 NOTE — Anesthesia Postprocedure Evaluation (Signed)
 Anesthesia Post Note  Patient: Jay Montgomery  Procedure(s) Performed: ANTERIOR CERVICAL DECOMPRESSION/DISCECTOMY FUSION 3 LEVELS  Patient location during evaluation: PACU Anesthesia Type: General Level of consciousness: awake and alert Pain management: pain level controlled Vital Signs Assessment: post-procedure vital signs reviewed and stable Respiratory status: spontaneous breathing, nonlabored ventilation, respiratory function stable and patient connected to nasal cannula oxygen Cardiovascular status: blood pressure returned to baseline and stable Postop Assessment: no apparent nausea or vomiting Anesthetic complications: no   No notable events documented.   Last Vitals:  Vitals:   07/27/24 1045 07/27/24 1100  BP: (!) 140/92 136/81  Pulse: 94 96  Resp: (!) 22 18  Temp:    SpO2: 97% 92%    Last Pain:  Vitals:   07/27/24 1054  TempSrc:   PainSc: 8                  Debby Mines

## 2024-07-27 NOTE — Interval H&P Note (Signed)
 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  The various methods of treatment have been discussed with the patient and family. After consideration of risks, benefits and other options for treatment, the patient has consented to  Procedure(s) with comments: ANTERIOR CERVICAL DECOMPRESSION/DISCECTOMY FUSION 3 LEVELS (N/A) - C3-6 ANTERIOR CERVICAL DISCECTOMY AND FUSION as a surgical intervention.  The patient's history has been reviewed, patient examined, no change in status, stable for surgery.  I have reviewed the patient's chart and labs.  Questions were answered to the patient's satisfaction.    Heart sounds normal no MRG. Chest Clear to Auscultation Bilaterally.    [name]

## 2024-07-27 NOTE — Op Note (Signed)
 Indications: [name] is a 38 y.o. male with G95.9 Cervical myelopathy, M48.02 Spinal stenosis in cervical region, G95.89 Myelomalacia   Due to ongoing symptoms, surgery was recommended  Findings: stenosis, successful decompression  Preoperative Diagnosis: G95.9 Cervical myelopathy, M48.02 Spinal stenosis in cervical region, G95.89 Myelomalacia  Postoperative Diagnosis: same   EBL: 200 ml IVF: see AR Drains: one Disposition: Extubated and Stable to PACU Complications: none  No foley catheter was placed.   Preoperative Note:    Risks of surgery discussed include: infection, bleeding, stroke, coma, death, paralysis, CSF leak, nerve/spinal cord injury, numbness, tingling, weakness, complex regional pain syndrome, recurrent stenosis and/or disc herniation, vascular injury, development of instability, neck/back pain, need for further surgery, persistent symptoms, development of deformity, and the risks of anesthesia. The patient understood these risks and agreed to proceed.  Operative Note:  Operative Procedure: 1. Anterior Cervical Discectomy and Fusion C4-5 including bilateral foraminotomies and end plate preparation  2. Anterior Cervical Discectomy and Fusion C5-6 including bilateral foraminotomies and end plate preparation  3. Anterior Cervical Discectomy and Fusion C3-4 including bilateral foraminotomies and end plate preparation 4. Anterior Spinal Instrumentation C3 to 6 using Globus Xtend 5. Anterior arthrodesis from C3 to C6 with placement of biomechanical devices at C3-4, C4-5, and C5-6 6. Use of the operative microscope 7. Use of intraoperative flouroscopy  PROCEDURE IN DETAIL: After obtaining informed consent, the patient taken to the operating room, placed in supine position, general anesthesia induced.  The patient had a small shoulder roll placed behind their shoulders.  The patient received preop antibiotics and IV Decadron .  The patient had a neck incision  outlined, was prepped and draped in usual sterile fashion. A timeout was performed.  The incision was injected with local anesthetic.   An incision was opened, dissection taken down medial to the carotid artery and jugular vein, lateral to the trachea and esophagus.  The prevertebral fascia identified and a localizing x-ray demonstrated the correct level.  The longus colli were dissected laterally, and self-retaining retractors placed to open the operative field. The microscope was then brought into the field.  With this complete, distractor pins were placed in the vertebral bodies of C3 and C4. The distractor was placed from C3-4, and the annulus at C3-4 was opened using a bovie.  Curettes and pituitary rongeurs used to remove the majority of disk, then the drill was used to remove the posterior osteophyte and begin the foraminotomies. The nerve hook was used to elevate the posterior longitudinal ligament, which was then removed with Kerrison rongeurs. The microblunt nerve hook could be passed out the foramen bilaterally.   Meticulous hemostasis was obtained.    A trial was used to size the disc space. A biomechanical device (Globus Hedron C 8 mm height x 14 mm width x 12 mm depth) was filled with bone-promoting allograft and tapped behind the anterior lip of the vertebral body at C3/4.    The distractor was removed, and the C3 distractor pin removed. Bone wax was used for hemostasis.  A caspar pin was placed at C6, then the distractor placed at C4-6. The annulus C4-5 was opened using a bovie.  Curettes and pituitary rongeurs used to remove the majority of disk, then the drill was used to remove the posterior osteophyte and begin the foraminotomies. The nerve hook was used to elevate the posterior longitudinal ligament, which was then removed with Kerrison rongeurs. The microblunt nerve hook could be passed out the foramen bilaterally.   Meticulous  hemostasis was obtained.    A trial was used to size the  disc space. A biomechanical device (Globus Hedron C 8 mm height x 14 mm width x 12 mm depth) was filled with bone-promoting allograft and tapped behind the anterior lip of the vertebral body at C4/5.    The annulus C5-6 was opened using a bovie.  Curettes and pituitary rongeurs used to remove the majority of disk, then the drill was used to remove the posterior osteophyte and begin the foraminotomies. The nerve hook was used to elevate the posterior longitudinal ligament, which was then removed with Kerrison rongeurs. The microblunt nerve hook could be passed out the foramen bilaterally.   Meticulous hemostasis was obtained.    A trial was used to size the disc space. A biomechanical device (Globus Hedron C 9 mm height x 14 mm width x 12 mm depth) was filled with bone-promoting allograft and tapped behind the anterior lip of the vertebral body at C5/6.   The caspar distractor was removed, and bone wax used for hemostasis at each level. The anterior osteophytes were removed.   A separate, four segment, three level plate (58 mm Nuvasive ACP) was chosen.  Two screws placed in the vertebral bodies of all four segments, respectively making sure the screws were behind the locking mechanism.  Final AP and lateral radiographs were taken.  Please note that the plate is not inclusive to the biomechanical devices.  The anchoring mechanism of the plate is completely separate from the biomechanical devices.   A drain was placed.  With everything in good position, the wound was irrigated copiously with bacitracin -containing solution and meticulous hemostasis obtained.  Wound was closed in 2 layers using interrupted inverted 3-0 Vicryl sutures in the platysma and 3-0 monocryl in the dermis.  The wound was dressed with dermabond, the head of bed at 30 degrees, taken to recovery room in stable condition.  No new postop neurological deficits were identified.  All counts were correct at the end of the case.    Monitoring was used throughout without any changes.  I performed the critical portions of the procedure.   Reeves Daisy MD

## 2024-07-28 DIAGNOSIS — G959 Disease of spinal cord, unspecified: Secondary | ICD-10-CM | POA: Diagnosis not present

## 2024-07-28 DIAGNOSIS — M4802 Spinal stenosis, cervical region: Secondary | ICD-10-CM | POA: Diagnosis not present

## 2024-07-28 NOTE — Evaluation (Signed)
 Occupational Therapy Evaluation Patient Details Name: Jay Montgomery MRN: [id number] DOB: 07-14-1986 Today's Date: 07/28/2024   History of Present Illness   [name] is a 38 y.o. 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 Pt PO multi-level Anterior Cervical Discectomy and Fusion DOS 11/07.     Clinical Impressions Pt seen for Occupational Therapy evaluation this date. Pt lives with his significant other in a mobile home with 5 steps to enter with a hand rail.  He required assistance prior to admission with basic self care tasks and homemaking tasks.  Pt has bilateral hand numbness and has difficulty with picking up and holding objects at times.  Pt presents with muscle weakness, pain, decreased functional mobility, transfers and decreased ability to perform self care tasks.  Pt would benefit from skilled OT services to maximize safety and independence in necessary daily tasks.       If plan is discharge home, recommend the following:   A little help with walking and/or transfers;A lot of help with bathing/dressing/bathroom;Assistance with cooking/housework;Assist for transportation     Functional Status Assessment   Patient has had a recent decline in their functional status and demonstrates the ability to make significant improvements in function in a reasonable and predictable amount of time.     Equipment Recommendations         Recommendations for Other Services         Precautions/Restrictions   Precautions Precautions: Cervical Required Braces or Orthoses: Cervical Brace Cervical Brace: Hard collar Restrictions Weight Bearing Restrictions Per Provider Order: No     Mobility Bed Mobility Overal bed mobility: Needs Assistance Bed Mobility: Rolling, Sidelying to Sit Rolling: Min assist Sidelying to sit: Min assist            Transfers Overall transfer level: Needs  assistance Equipment used: Straight cane Transfers: Sit to/from Stand Sit to Stand: Min assist, Contact guard assist                  Balance Overall balance assessment: Needs assistance Sitting-balance support: Feet supported Sitting balance-Leahy Scale: Good                                     ADL either performed or assessed with clinical judgement   ADL Overall ADL's : Needs assistance/impaired Eating/Feeding: Modified independent   Grooming: Modified independent   Upper Body Bathing: Minimal assistance   Lower Body Bathing: Moderate assistance   Upper Body Dressing : Minimal assistance   Lower Body Dressing: Moderate assistance     Toilet Transfer Details (indicate cue type and reason): not tested this date         Functional mobility during ADLs: Minimal assistance;Cane General ADL Comments: Pt reports dropping items frequently prior to surgery secondary to numbness in bilateral hands, difficulty holding objects.     Vision         Perception         Praxis         Pertinent Vitals/Pain Pain Assessment Pain Assessment: 0-10 Pain Score: 8      Extremity/Trunk Assessment Upper Extremity Assessment Upper Extremity Assessment: Generalized weakness;LUE deficits/detail;Right hand dominant;RUE deficits/detail RUE Deficits / Details: numbness in bilateral hands, reports right worse than left and appears unchanged since surgery. RUE Sensation: decreased light touch LUE Deficits / Details: pt reports residual numbness in his hands and feels  the left is slightly better since surgery.   Lower Extremity Assessment Lower Extremity Assessment: Generalized weakness   Cervical / Trunk Assessment Cervical / Trunk Assessment: Neck Surgery   Communication Communication Communication: No apparent difficulties   Cognition Arousal: Alert Behavior During Therapy: WFL for tasks assessed/performed                                          Cueing  General Comments          Exercises     Shoulder Instructions      Home Living Family/patient expects to be discharged to:: Private residence Living Arrangements: Spouse/significant other Available Help at Discharge: Family Type of Home: Mobile home Home Access: Stairs to enter Secretary/administrator of Steps: 5 Entrance Stairs-Rails: Left Home Layout: One level     Bathroom Shower/Tub: It Trainer: Standard Bathroom Accessibility: Yes   Home Equipment: Cane - single point   Additional Comments: reacher and C pap      Prior Functioning/Environment Prior Level of Function : Independent/Modified Independent             Mobility Comments: used cane at home for mobility ADLs Comments: Pt reports he required assistance with lower body bathing and dressing prior to admission.  Significant other helps with homemaking tasks, cooking.    OT Problem List: Decreased strength;Decreased activity tolerance;Decreased knowledge of use of DME or AE;Impaired UE functional use;Impaired balance (sitting and/or standing);Pain   OT Treatment/Interventions: Self-care/ADL training;Therapeutic exercise;DME and/or AE instruction;Patient/family education;Therapeutic activities      OT Goals(Current goals can be found in the care plan section)   Acute Rehab OT Goals Patient Stated Goal: Pt would like to return home with his significant other, be as independent as possible OT Goal Formulation: With patient Time For Goal Achievement: 08/11/24 Potential to Achieve Goals: Good ADL Goals Pt Will Perform Lower Body Bathing: with min assist Pt Will Perform Lower Body Dressing: with min assist Pt Will Transfer to Toilet: with contact guard assist   OT Frequency:  Min 2X/week    Co-evaluation              AM-PAC OT 6 Clicks Daily Activity     Outcome Measure Help from another person eating meals?: None Help from another  person taking care of personal grooming?: None Help from another person toileting, which includes using toliet, bedpan, or urinal?: A Lot Help from another person bathing (including washing, rinsing, drying)?: A Lot Help from another person to put on and taking off regular upper body clothing?: A Little Help from another person to put on and taking off regular lower body clothing?: A Lot 6 Click Score: 17   End of Session    Activity Tolerance: Patient tolerated treatment well Patient left: in bed;with family/visitor present;with bed alarm set  OT Visit Diagnosis: Unsteadiness on feet (R26.81);Muscle weakness (generalized) (M62.81);Pain Pain - part of body:  (neck)                Time: 8844-8789 OT Time Calculation (min): 15 min Charges:  OT General Charges $OT Visit: 1 Visit OT Evaluation $OT Eval Low Complexity: 1 Low  Janetta Vandoren T Lacora Folmer, OTR/L, CLT  Ferlin Fairhurst 07/28/2024, 2:07 PM

## 2024-07-28 NOTE — Plan of Care (Signed)
  Problem: Education: Goal: Knowledge of General Education information will improve Description: Including pain rating scale, medication(s)/side effects and non-pharmacologic comfort measures Outcome: Progressing   Problem: Clinical Measurements: Goal: Ability to maintain clinical measurements within normal limits will improve Outcome: Progressing   Problem: Nutrition: Goal: Adequate nutrition will be maintained Outcome: Progressing   Problem: Coping: Goal: Level of anxiety will decrease Outcome: Progressing   Problem: Pain Managment: Goal: General experience of comfort will improve and/or be controlled Outcome: Progressing   Problem: Safety: Goal: Ability to remain free from injury will improve Outcome: Progressing

## 2024-07-28 NOTE — Evaluation (Signed)
 Physical Therapy Evaluation Patient Details Name: Jay Montgomery MRN: [id number] DOB: 1985/12/19 Today's Date: 07/28/2024  History of Present Illness  [name] is a 38 y.o. 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 Pt PO multi-level Anterior Cervical Discectomy and Fusion DOS 11/07.  Clinical Impression  Patient noted to be in sitting position at PT arrival in room, for an initial PT evaluation due to a decline in functional status, with baseline mobility reported as modI, and currently requiring min/CGA for transfers and hallway ambulation. The patient is A&O x 4, presenting with good willingness to work with PT and goals of getting stronger, with discharge expectations that include HHPT. The patient resides in a mobile home and lives with family/friend support. There are 5 steps to enter with L hand rail and inside the residence.  Vitals SpO? of 96% on RW at rest slightly de-stated to 92% after ambulation, and gait was assessed with SPC, limited by mild unsteadiness. Gait mechanic observations noted posterior weight shift, R/L drift with R/L pseudo trendelenburg type gait. Activity restrictions include impaired prolonged standing and limited community ambulation with history of fatigue within the last 6 months, and the overall clinical impression is that the patient presents with moderate mobility limitations secondary to sedentary lifestyle and acute medical complications. Recommended skilled PT will address safety, mobility, and discharge planning. PT recommendation to d/c patient to HHPT upon medical clearance.        If plan is discharge home, recommend the following: A little help with walking and/or transfers;A little help with bathing/dressing/bathroom   Can travel by private vehicle        Equipment Recommendations None recommended by PT  Recommendations for Other Services       Functional Status Assessment  Patient has had a recent decline in their functional status and/or demonstrates limited ability to make significant improvements in function in a reasonable and predictable amount of time     Precautions / Restrictions Precautions Required Braces or Orthoses: Cervical Brace Cervical Brace: Hard collar Restrictions Weight Bearing Restrictions Per Provider Order: No      Mobility  Bed Mobility                    Transfers Overall transfer level: Needs assistance Equipment used: Straight cane Transfers: Sit to/from Stand Sit to Stand: Min assist, Contact guard assist                Ambulation/Gait Ambulation/Gait assistance: Min assist, Contact guard assist Gait Distance (Feet): 75 Feet Assistive device: 1 person hand held assist, Straight cane Gait Pattern/deviations: Step-through pattern, Decreased stride length, Leaning posteriorly, Drifts right/left, Wide base of support Gait velocity: decreased     General Gait Details: pt. required intermittent CGA to minA thoughout mobilization/ambulation; constant need for vc forward weight shift backwards  Stairs            Wheelchair Mobility     Tilt Bed    Modified Rankin (Stroke Patients Only)       Balance Overall balance assessment: Needs assistance Sitting-balance support: Feet supported Sitting balance-Leahy Scale: Good   Postural control: Posterior lean Standing balance support: During functional activity, Reliant on assistive device for balance Standing balance-Leahy Scale: Fair                               Pertinent Vitals/Pain Pain Assessment Pain Assessment: Faces Faces Pain  Scale: Hurts even more Pain Location: neck Pain Descriptors / Indicators: Aching, Sore Pain Intervention(s): Limited activity within patient's tolerance, Monitored during session, Repositioned    Home Living Family/patient expects to be discharged to:: Private residence Living Arrangements:  Spouse/significant other   Type of Home: Mobile home Home Access: Stairs to enter Entrance Stairs-Rails: Left Entrance Stairs-Number of Steps: 5   Home Layout: One level Home Equipment: Cane - single point Additional Comments: reacher and C pap    Prior Function Prior Level of Function : Independent/Modified Independent             Mobility Comments: used cane and didnt move around much       Extremity/Trunk Assessment        Lower Extremity Assessment Lower Extremity Assessment: Generalized weakness    Cervical / Trunk Assessment Cervical / Trunk Assessment: Neck Surgery  Communication   Communication Communication: No apparent difficulties    Cognition Arousal: Alert Behavior During Therapy: WFL for tasks assessed/performed   PT - Cognitive impairments: No apparent impairments                         Following commands: Intact       Cueing Cueing Techniques: Verbal cues     General Comments      Exercises Other Exercises Other Exercises: Pt educated on cervical precautions and educated caregiver/fiance on ocassional need to provide some physical assistance   Assessment/Plan    PT Assessment Patient needs continued PT services  PT Problem List Decreased strength;Decreased activity tolerance;Decreased balance;Decreased mobility;Decreased coordination       PT Treatment Interventions DME instruction;Gait training;Stair training;Functional mobility training;Therapeutic activities;Therapeutic exercise;Balance training;Neuromuscular re-education;Patient/family education    PT Goals (Current goals can be found in the Care Plan section)  Acute Rehab PT Goals PT Goal Formulation: With patient/family Time For Goal Achievement: 08/11/24 Potential to Achieve Goals: Fair    Frequency Min 2X/week     Co-evaluation               AM-PAC PT 6 Clicks Mobility  Outcome Measure Help needed turning from your back to your side while in a  flat bed without using bedrails?: A Little Help needed moving from lying on your back to sitting on the side of a flat bed without using bedrails?: A Little Help needed moving to and from a bed to a chair (including a wheelchair)?: A Little Help needed standing up from a chair using your arms (e.g., wheelchair or bedside chair)?: A Little Help needed to walk in hospital room?: A Little Help needed climbing 3-5 steps with a railing? : A Lot 6 Click Score: 17    End of Session Equipment Utilized During Treatment: Gait belt;Cervical collar Activity Tolerance: Patient tolerated treatment well;No increased pain Patient left: in chair;with family/visitor present Nurse Communication: Mobility status PT Visit Diagnosis: Other abnormalities of gait and mobility (R26.89);Muscle weakness (generalized) (M62.81);Difficulty in walking, not elsewhere classified (R26.2)    Time: 8977-8961 PT Time Calculation (min) (ACUTE ONLY): 16 min   Charges:       PT General Charges $$ ACUTE PT VISIT: 1 Visit         Sherlean Lesches DPT, PT    Sherlean A Brittany Osier 07/28/2024, 10:57 AM

## 2024-07-28 NOTE — Progress Notes (Signed)
 Neurosurgery visit note Patient seen and examined.  Pain control overnight patient did have episodes of slight decrease in O2 saturations down to 93% was carefully watched overnight denies any shortness of breath and otherwise is doing well this morning.  He is about to mobilize with the nursing teams.  The drain overnight only put out 15 cc.  I remove this and the bedside this morning. Patient otherwise a neurologic exam appears to be awake fluent and appropriate.  His hand strength is approximately 4+ out of 5 bilaterally and subjectively improved from preoperative otherwise denies any radicular symptoms in his lower extremities grossly intact lower extremity sensation.  And strength  AP: Overall the patient is doing extremely well we will continue to mobilize today and depending on his mobilization and pain control status may be able to discharge later today versus tomorrow.  Belvie PARAS. Deatrice, MD Neurosurgery

## 2024-07-29 ENCOUNTER — Other Ambulatory Visit (HOSPITAL_COMMUNITY): Payer: Self-pay

## 2024-07-29 DIAGNOSIS — M4802 Spinal stenosis, cervical region: Secondary | ICD-10-CM | POA: Diagnosis not present

## 2024-07-29 DIAGNOSIS — G959 Disease of spinal cord, unspecified: Secondary | ICD-10-CM | POA: Diagnosis not present

## 2024-07-29 NOTE — Progress Notes (Signed)
 Physical Therapy Treatment Patient Details Name: Jay Montgomery MRN: [id number] DOB: [birth year] Today's Date: 07/29/2024   History of Present Illness [name] is a 38 y.o. 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 Pt PO multi-level Anterior Cervical Discectomy and Fusion DOS 11/07.    PT Comments  Patient seen for PT session focused on stair navigation. Patient required CGA for stair navigation and used SPC. Tolerated session wll with no signs of exertion or distress. Vitals remained stable during activity.  Patient shows good potential to make progress and is near baseline level of function. Continued skilled PT recommended to progress toward functional goals. Pt is safe to d/c with support. Discharge recommendation remains appropriate     If plan is discharge home, recommend the following: A little help with walking and/or transfers;A little help with bathing/dressing/bathroom   Can travel by private vehicle        Equipment Recommendations  None recommended by PT    Recommendations for Other Services       Precautions / Restrictions Precautions Precautions: Cervical Required Braces or Orthoses: Cervical Brace Cervical Brace: Hard collar Restrictions Weight Bearing Restrictions Per Provider Order: No     Mobility  Bed Mobility                    Transfers Overall transfer level: Needs assistance Equipment used: Straight cane Transfers: Sit to/from Stand Sit to Stand: Min assist, Contact guard assist                Ambulation/Gait Ambulation/Gait assistance: Min assist, Contact guard assist Gait Distance (Feet): 150 Feet Assistive device: 1 person hand held assist, Straight cane Gait Pattern/deviations: Step-through pattern, Decreased stride length, Leaning posteriorly, Drifts right/left, Wide base of support Gait velocity: decreased     General Gait Details: pt. required  intermittent CGA to minA thoughout mobilization/ambulation; constant need for vc forward weight shift backwards   Stairs Stairs: Yes Stairs assistance: Contact guard assist, Supervision Stair Management: One rail Left Number of Stairs: 5     Wheelchair Mobility     Tilt Bed    Modified Rankin (Stroke Patients Only)       Balance Overall balance assessment: Needs assistance Sitting-balance support: Feet supported Sitting balance-Leahy Scale: Good   Postural control: Posterior lean Standing balance support: During functional activity, Reliant on assistive device for balance Standing balance-Leahy Scale: Fair                              Communication    Cognition Arousal: Alert Behavior During Therapy: WFL for tasks assessed/performed                                    Cueing    Exercises      General Comments        Pertinent Vitals/Pain Pain Assessment Pain Score: 6  Pain Location: neck Pain Descriptors / Indicators: Aching, Sore Pain Intervention(s): Limited activity within patient's tolerance, Monitored during session, Repositioned    Home Living                          Prior Function            PT Goals (current goals can now be found in the care plan section) Acute Rehab  PT Goals PT Goal Formulation: With patient/family Time For Goal Achievement: 08/11/24 Potential to Achieve Goals: Fair Progress towards PT goals: Progressing toward goals    Frequency    Min 2X/week      PT Plan      Co-evaluation              AM-PAC PT 6 Clicks Mobility   Outcome Measure  Help needed turning from your back to your side while in a flat bed without using bedrails?: A Little Help needed moving from lying on your back to sitting on the side of a flat bed without using bedrails?: A Little Help needed moving to and from a bed to a chair (including a wheelchair)?: A Little Help needed standing up from a  chair using your arms (e.g., wheelchair or bedside chair)?: A Little Help needed to walk in hospital room?: A Little Help needed climbing 3-5 steps with a railing? : A Lot 6 Click Score: 17    End of Session Equipment Utilized During Treatment: Gait belt;Cervical collar Activity Tolerance: Patient tolerated treatment well;No increased pain Patient left: in chair;with family/visitor present Nurse Communication: Mobility status PT Visit Diagnosis: Other abnormalities of gait and mobility (R26.89);Muscle weakness (generalized) (M62.81);Difficulty in walking, not elsewhere classified (R26.2)     Time: 8848-8794 PT Time Calculation (min) (ACUTE ONLY): 14 min  Charges:    $Therapeutic Activity: 8-22 mins PT General Charges $$ ACUTE PT VISIT: 1 Visit                     Sherlean Lesches DPT, PT     Sherlean A Arne Schlender 07/29/2024, 12:10 PM

## 2024-07-29 NOTE — Progress Notes (Signed)
 Neurosurgery visit note Patient seen and examined he is doing well this morning.  Pain is controlled.  He is able to ambulate and tolerating p.o.'s fine and has been working with physical therapy this morning but has 1 more session.  Notes only minor difficulty swallowing no difficulty with speech or phonation no swelling or fluctuant fluid collections around his neck area his incision is well-healed.  He is wearing his cervical collar.  On physical exam is awake fluent and appropriate his cranial nerves are grossly intact.  He has 4+ out of 5 bilateral handgrip improved from preop otherwise ambulating with good strength.  AP: Over the patient is doing extremely well he can be discharged today and he has follow-up already arranged  Jay Montgomery. Deatrice, MD Neurosurgery

## 2024-07-29 NOTE — Plan of Care (Signed)
 Patient is pleasant. Alert and oriented x4. Clear lung sounds, on room air no noted cough. Abdomen soft, last bowel movement 11/8, +bowel sounds. Voiding without any difficulty. +CMS, able to feel sensation, patient able to wiggle fingers and toes,+dorsi/plantar flex, complains of numbness or tingling BUE but states LUE is better today. PRN for pain given with positive affect in reduction of pain. Tolerating diet. Ambulating standby assist out of bed, with miami collar on. Incision on neck C/D/I with dermabond. Dressing soiled of where JP had been removed and this RN changed gauze and Tegaderm. Hourly rounding performed, fall precautions maintained, and call bell within reach.  Problem: Education: Goal: Knowledge of General Education information will improve Description: Including pain rating scale, medication(s)/side effects and non-pharmacologic comfort measures Outcome: Progressing   Problem: Health Behavior/Discharge Planning: Goal: Ability to manage health-related needs will improve Outcome: Progressing   Problem: Clinical Measurements: Goal: Ability to maintain clinical measurements within normal limits will improve Outcome: Progressing Goal: Will remain free from infection Outcome: Progressing Goal: Diagnostic test results will improve Outcome: Progressing Goal: Respiratory complications will improve Outcome: Progressing Goal: Cardiovascular complication will be avoided Outcome: Progressing   Problem: Activity: Goal: Risk for activity intolerance will decrease Outcome: Progressing   Problem: Nutrition: Goal: Adequate nutrition will be maintained Outcome: Progressing   Problem: Coping: Goal: Level of anxiety will decrease Outcome: Progressing   Problem: Elimination: Goal: Will not experience complications related to bowel motility Outcome: Progressing Goal: Will not experience complications related to urinary retention Outcome: Progressing   Problem: Pain  Managment: Goal: General experience of comfort will improve and/or be controlled Outcome: Progressing   Problem: Safety: Goal: Ability to remain free from injury will improve Outcome: Progressing   Problem: Skin Integrity: Goal: Risk for impaired skin integrity will decrease Outcome: Progressing   Problem: Education: Goal: Ability to verbalize activity precautions or restrictions will improve Outcome: Progressing Goal: Knowledge of the prescribed therapeutic regimen will improve Outcome: Progressing Goal: Understanding of discharge needs will improve Outcome: Progressing   Problem: Activity: Goal: Ability to avoid complications of mobility impairment will improve Outcome: Progressing Goal: Ability to tolerate increased activity will improve Outcome: Progressing Goal: Will remain free from falls Outcome: Progressing   Problem: Bowel/Gastric: Goal: Gastrointestinal status for postoperative course will improve Outcome: Progressing   Problem: Clinical Measurements: Goal: Ability to maintain clinical measurements within normal limits will improve Outcome: Progressing Goal: Postoperative complications will be avoided or minimized Outcome: Progressing Goal: Diagnostic test results will improve Outcome: Progressing   Problem: Pain Management: Goal: Pain level will decrease Outcome: Progressing   Problem: Skin Integrity: Goal: Will show signs of wound healing Outcome: Progressing   Problem: Health Behavior/Discharge Planning: Goal: Identification of resources available to assist in meeting health care needs will improve Outcome: Progressing   Problem: Bladder/Genitourinary: Goal: Urinary functional status for postoperative course will improve Outcome: Progressing

## 2024-07-29 NOTE — Plan of Care (Signed)

## 2024-07-30 ENCOUNTER — Encounter: Payer: Self-pay | Admitting: Neurosurgery

## 2024-08-06 NOTE — Progress Notes (Unsigned)
   REFERRING PHYSICIAN:  Edman Meade PEDLAR, Fnp [address] [place],  [place] [address]  DOS: 07/27/24  ACDF C3-C6 for cervical myelopathy  HISTORY OF PRESENT ILLNESS: Story Vanvranken Bertling is 2 weeks status post above surgery. Given oxycodone  and robaxin  on discharge from the hospital.   Preop numbness/tingling in hands with dexterity issues and balance issues.       PHYSICAL EXAMINATION:  NEUROLOGICAL:  General: In no acute distress.   Awake, alert, oriented to person, place, and time.  Pupils equal round and reactive to light.  Facial tone is symmetric.    Strength: Side Biceps Triceps Deltoid Interossei Grip Wrist Ext. Wrist Flex.  R 5 5 5 5  4+*** 5 5  L 5 5 5 5  4+*** 5 5   Incision c/d/i  Imaging:  Nothing new to review.   Assessment / Plan: JAHLIL ZILLER is doing well s/p above surgery. Treatment options reviewed with patient and following plan made:   - We discussed activity escalation and I have advised the patient to lift up to 10 pounds until 6 weeks after surgery (until follow up with Dr. Clois).   - Reviewed wound care.  - Continue current medications including *** - Follow up as scheduled in 4 weeks and prn.   Advised to contact the office if any questions or concerns arise.   Glade Boys PA-C Dept of Neurosurgery

## 2024-08-08 ENCOUNTER — Ambulatory Visit (INDEPENDENT_AMBULATORY_CARE_PROVIDER_SITE_OTHER): Admitting: Orthopedic Surgery

## 2024-08-08 ENCOUNTER — Encounter: Payer: Self-pay | Admitting: Orthopedic Surgery

## 2024-08-08 VITALS — BP 140/88 | Temp 98.1°F | Ht 73.0 in | Wt 254.1 lb

## 2024-08-08 DIAGNOSIS — G959 Disease of spinal cord, unspecified: Secondary | ICD-10-CM

## 2024-08-08 DIAGNOSIS — Z981 Arthrodesis status: Secondary | ICD-10-CM

## 2024-08-08 MED ORDER — OXYCODONE HCL 5 MG PO TABS: 5.0000 mg | ORAL_TABLET | Freq: Four times a day (QID) | ORAL | 0 refills | Status: DC | PRN

## 2024-08-08 NOTE — Patient Instructions (Signed)
 It was nice to see you today.   I am sorry you are not feeling better yet.   Okay to get incision wet in the shower, do not submerge in pool or hot tub.   Call if any concerns about the incision such as redness, drainage, or fever/chills.   No bending, twisting, or lifting. You can lift up to 10 pounds until your follow up in 4 weeks.   Wear your collar when you are up and walking. Wear collar when you leave the house.   I sent a refill of oxycodone  to your pharmacy. Continue to take the least amount needed and only take for severe pain. Remember this medication can make you sleepy and/or constipated.   We will see you back in 4 weeks for your 6 weeks postop visit.   Your blood pressure was slightly elevated today. I want you to recheck it at home and follow up with your PCP if it remains high. If you have any chest pain, shortness of breath, blurry vision, or headaches then you need to go to ED. We have also sent your PCP a message to let them know about your elevated blood pressure.    Please call with any questions or concerns.   Glade Boys PA-C [phone]     The physicians and staff at [facility] at [facility] are committed to providing excellent care. You may receive a survey asking for feedback about your experience at our office. We value you your feedback and appreciate you taking the time to to fill it out. The [facility] leadership team is also available to discuss your experience in person, feel free to contact us  [phone].

## 2024-08-23 ENCOUNTER — Telehealth: Payer: Self-pay | Admitting: Adult Health

## 2024-08-23 NOTE — Telephone Encounter (Signed)
 MYC cxl

## 2024-08-27 ENCOUNTER — Telehealth: Payer: Self-pay | Admitting: Adult Health

## 2024-08-27 ENCOUNTER — Other Ambulatory Visit: Payer: Self-pay | Admitting: Physician Assistant

## 2024-08-27 DIAGNOSIS — G959 Disease of spinal cord, unspecified: Secondary | ICD-10-CM

## 2024-08-27 NOTE — Telephone Encounter (Signed)
 Patient called in for refill on Adderall 30mg . Ph: [phone] appt 12/29 Pharmacy [facility] [address] [place], [place]

## 2024-08-28 ENCOUNTER — Other Ambulatory Visit: Payer: Self-pay

## 2024-08-28 DIAGNOSIS — F909 Attention-deficit hyperactivity disorder, unspecified type: Secondary | ICD-10-CM

## 2024-08-28 MED ORDER — AMPHETAMINE-DEXTROAMPHETAMINE 30 MG PO TABS: ORAL_TABLET | ORAL | 0 refills | Status: AC

## 2024-08-28 NOTE — Telephone Encounter (Signed)
 Pended

## 2024-09-04 ENCOUNTER — Other Ambulatory Visit

## 2024-09-04 ENCOUNTER — Encounter: Admitting: Neurosurgery

## 2024-09-05 ENCOUNTER — Ambulatory Visit: Admitting: Physician Assistant

## 2024-09-05 ENCOUNTER — Encounter: Payer: Self-pay | Admitting: Physician Assistant

## 2024-09-05 ENCOUNTER — Other Ambulatory Visit

## 2024-09-05 VITALS — BP 136/88 | Ht 73.0 in | Wt 254.0 lb

## 2024-09-05 DIAGNOSIS — Z981 Arthrodesis status: Secondary | ICD-10-CM | POA: Diagnosis not present

## 2024-09-05 DIAGNOSIS — G959 Disease of spinal cord, unspecified: Secondary | ICD-10-CM

## 2024-09-05 NOTE — Progress Notes (Signed)
° °  REFERRING PHYSICIAN:  Edman Meade PEDLAR, Fnp [address] [place],  [place] [address]  DOS: 07/27/24  ACDF C3-C6 for cervical myelopathy  HISTORY OF PRESENT ILLNESS: Jay Montgomery is 6 weeks status post above surgery. He continues with his preop numbness/tingling in hands with dexterity issues and balance issues.  He does feel as though his pain has improved however.  At this point is only taking Tylenol  and Robaxin .   PHYSICAL EXAMINATION:  NEUROLOGICAL:  General: In no acute distress.   Awake, alert, oriented to person, place, and time.  Pupils equal round and reactive to light.  Facial tone is symmetric.    Strength: Side Biceps Triceps Deltoid Interossei Grip Wrist Ext. Wrist Flex.  R 5 5 5 5  4+ 5 5  L 5 5 5 5  4+ 5 5   Incision c/d/i  Imaging:  No hardware failure seen on x-rays today.  Will await final review from radiologist.  Assessment / Plan: Jay Montgomery is doing reasonably well s/p above surgery. Treatment options reviewed with patient and following plan made:   - We discussed activity escalation and I have advised the patient to lift up to 25 pounds until 12 weeks after surgery (until follow up with Dr. Clois).   - Continue current medications with Tylenol  and Robaxin  as needed. - Discontinue cervical collar at this time. - Follow up as scheduled in 6 weeks and prn.  - He is concerned about his continued gait instability.  I have ordered home health physical therapy to help with this.   Advised to contact the office if any questions or concerns arise.   Lyle Decamp PA-C Dept of Neurosurgery

## 2024-09-07 ENCOUNTER — Ambulatory Visit (HOSPITAL_BASED_OUTPATIENT_CLINIC_OR_DEPARTMENT_OTHER): Admitting: Pharmacist Clinician (PhC)/ Clinical Pharmacy Specialist

## 2024-09-07 NOTE — Progress Notes (Deleted)
 "  Office Visit    Patient Name: Jay Montgomery Date of Encounter: 09/07/2024  Primary Care Provider:  Edman Meade PEDLAR, FNP Primary Cardiologist:  Lynwood Schilling, MD  Chief Complaint    Hypertension - Advanced hypertension clinic  Past Medical History   HLD 7/25 LDL 143, (baseline > 190); on rosuvastatin  10  Depression/anxiety On lithium  - watch closely for drug interactions  tachycardia HR 100-120, even prior to starting Adderall       Allergies  Allergen Reactions   Hydrocodone  Itching   Hydroxyzine  Other (See Comments)    Dizziness, mental fogginess, nausea   Hydrocodone -Acetaminophen  Hives, Itching and Rash    History of Present Illness    Jay Montgomery is a 38 y.o. male patient who was referred to the Advanced Hypertension Clinic by Gloria Zarwolo NP.   He was first diagnosed in November 2024 with elevated diastolic readings, although systolic mostly in the 130's.    He was seen in June by Reche walker and subsequent labs showed no signs of hyperaldosteronism, but did have an elevated norepinephrine level.  A follow up 24 hr urine showed normal results.  He did have low Vitamin D  at 15, this is now supplemented with weekly replacement.   When I saw him in August, he had not seen much drop in diastolic readings, and added spironolactone  12.5 mg.   At follow up in September nifedipine  was increased to 90 mg daily.  When I saw him in October he had just come from an overnight sleep study, so had not slept well.  He also had cervical spinal fusion in November  Today he is in the office for follow up.      Blood Pressure Goal:  130/80  Current Medications: nifedipine  xl 90 daily, carvedilol  12.5 mg bid, spironolactone  12.5 mg daily  Adherence Assessment  Do you ever forget to take your medication? [] Yes [x] No  Do you ever skip doses due to side effects? [] Yes [x] No  Do you have trouble affording your medicines? [] Yes [x] No  Are you ever unable to pick up  your medication due to transportation difficulties? [] Yes [x] No   Adherence strategy: none  Previously tried:  verapamil  - hypotension   Family Hx:  father recently diagnosed with hypertension, mother deceased, early hypertension, died COPD; sister with hole in heart ; kids 9,16  Social Hx:      Tobacco: vape daily  Alcohol: no  Caffeine: sweet tea daily    Diet:    more water than other drinks; now eating less takeout and more salads (ham, cheese, lettuce, tomato) for lunch; no breakfast; dinner - variety of proteins, vegetables are corn and green beans (canned low sodium), tomatoes; notes with pain he tends to eat only once per daily  Exercise: limited by back pain;   Home BP readings:   mostly 138-140, nothing at 150 since adding spironolactone ; diastolic now always < 100 ;  Drug-drug interaction:  lithium  vs spironolactone  - The specific mechanism of this possible interaction is not clear, but any increase in lithium  concentrations may possibly be related to urinary sodium loss caused by the diuretic with subsequent changes in lithium  excretion. Any decrease in lithium  concentrations may result from impaired lithium  reabsorption.   Accessory Clinical Findings    Lab Results  Component Value Date   CREATININE 1.17 05/31/2024   BUN 12 05/31/2024   NA 138 05/31/2024   K 4.3 05/31/2024   CL 100 05/31/2024   CO2 19 (L) 05/31/2024  Lab Results  Component Value Date   ALT 47 (H) 03/29/2024   AST 24 03/29/2024   ALKPHOS 128 (H) 03/29/2024   BILITOT 0.4 03/29/2024   Lab Results  Component Value Date   HGBA1C 5.4 03/29/2024    Screening for Secondary Hypertension:      Relevant Labs/Studies:    Latest Ref Rng & Units 05/31/2024    9:27 AM 03/29/2024    9:22 AM 03/15/2024    9:36 AM  Basic Labs  Sodium 134 - 144 mmol/L 138  140  137   Potassium 3.5 - 5.2 mmol/L 4.3  4.3  4.7   Creatinine 0.76 - 1.27 mg/dL 8.82  8.90  8.93        Latest Ref Rng & Units 03/29/2024     9:22 AM 03/15/2024    9:36 AM  Thyroid    TSH 0.450 - 4.500 uIU/mL 1.670  1.220        Latest Ref Rng & Units 03/15/2024    9:36 AM  Renin/Aldosterone   Aldosterone 0.0 - 30.0 ng/dL 63.8   Aldos/Renin Ratio 0.0 - 30.0 6.5        Latest Ref Rng & Units 03/15/2024    9:36 AM  Metanephrines/Catecholamines   Epinephrine  0.0 - 55.4 pg/mL 43.2   Norepinephrine 115 - 524 pg/mL 834   Dopamine 0.0 - 36.7 pg/mL 18.1   Metanephrines 0.0 - 88.0 pg/mL <25.0   Normetanephrines  0.0 - 210.1 pg/mL 123.4           Home Medications    Current Outpatient Medications  Medication Sig Dispense Refill   amphetamine -dextroamphetamine  (ADDERALL) 30 MG tablet Take one tablet daily. 30 tablet 0   ARIPiprazole  (ABILIFY ) 10 MG tablet Take 1 tablet (10 mg total) by mouth 2 (two) times daily. 60 tablet 2   buPROPion  (WELLBUTRIN  XL) 150 MG 24 hr tablet Take two tablets every morning. 60 tablet 2   busPIRone  (BUSPAR ) 10 MG tablet Take 1 tablet (10 mg total) by mouth 3 (three) times daily. 90 tablet 2   carvedilol  (COREG ) 12.5 MG tablet Take 1 tablet (12.5 mg total) by mouth 2 (two) times daily. 180 tablet 3   divalproex  (DEPAKOTE  ER) 500 MG 24 hr tablet Take 1 tablet (500 mg total) by mouth daily. 30 tablet 5   docusate sodium  (COLACE) 100 MG capsule Take 1 capsule (100 mg total) by mouth 2 (two) times daily. 10 capsule 0   gabapentin  (NEURONTIN ) 300 MG capsule Take 1 capsule (300 mg total) by mouth at bedtime. 90 capsule 3   lithium  carbonate 150 MG capsule Take 1 capsule (150 mg total) by mouth at bedtime. 30 capsule 2   methocarbamol  (ROBAXIN ) 500 MG tablet Take 1 tablet (500 mg total) by mouth every 8 (eight) hours as needed for muscle spasms. 60 tablet 2   naloxone  (NARCAN ) nasal spray 4 mg/0.1 mL Place 1 spray into the nose once.     NIFEdipine  (PROCARDIA  XL/NIFEDICAL-XL) 90 MG 24 hr tablet Take 1 tablet (90 mg total) by mouth daily. 30 tablet 6   rosuvastatin  (CRESTOR ) 10 MG tablet Take 1 tablet (10 mg  total) by mouth daily. 90 tablet 3   senna (SENOKOT) 8.6 MG TABS tablet Take 1 tablet (8.6 mg total) by mouth daily. 120 tablet 0   spironolactone  (ALDACTONE ) 25 MG tablet Take 0.5 tablets (12.5 mg total) by mouth daily. 45 tablet 3   Vitamin D , Ergocalciferol , (DRISDOL ) 1.25 MG (50000 UNIT) CAPS capsule Take 1 capsule (  50,000 Units total) by mouth every 7 (seven) days. 27 capsule 1   No current facility-administered medications for this visit.     Assessment & Plan   No problem-specific Assessment & Plan notes found for this encounter.   Joanna Borawski PharmD CPP [facility]  [address] [place], [place] [address] [phone]   "

## 2024-09-17 ENCOUNTER — Encounter: Payer: Self-pay | Admitting: Adult Health

## 2024-09-17 ENCOUNTER — Telehealth: Admitting: Adult Health

## 2024-09-17 DIAGNOSIS — Z79899 Other long term (current) drug therapy: Secondary | ICD-10-CM

## 2024-09-17 NOTE — Progress Notes (Signed)
 Patient no show appointment.   Labs sent.

## 2024-09-25 ENCOUNTER — Telehealth (HOSPITAL_BASED_OUTPATIENT_CLINIC_OR_DEPARTMENT_OTHER): Payer: Self-pay | Admitting: *Deleted

## 2024-09-25 ENCOUNTER — Encounter (HOSPITAL_BASED_OUTPATIENT_CLINIC_OR_DEPARTMENT_OTHER): Payer: Self-pay | Admitting: Cardiovascular Disease

## 2024-09-25 ENCOUNTER — Ambulatory Visit (HOSPITAL_BASED_OUTPATIENT_CLINIC_OR_DEPARTMENT_OTHER): Admitting: Cardiovascular Disease

## 2024-09-25 VITALS — BP 122/78 | HR 88 | Ht 73.0 in | Wt 261.2 lb

## 2024-09-25 DIAGNOSIS — Z5181 Encounter for therapeutic drug level monitoring: Secondary | ICD-10-CM | POA: Diagnosis not present

## 2024-09-25 DIAGNOSIS — E782 Mixed hyperlipidemia: Secondary | ICD-10-CM

## 2024-09-25 DIAGNOSIS — R001 Bradycardia, unspecified: Secondary | ICD-10-CM

## 2024-09-25 DIAGNOSIS — I10 Essential (primary) hypertension: Secondary | ICD-10-CM | POA: Diagnosis not present

## 2024-09-25 MED ORDER — HYDRALAZINE HCL 25 MG PO TABS: 25.0000 mg | ORAL_TABLET | Freq: Two times a day (BID) | ORAL | 1 refills | Status: AC

## 2024-09-25 MED ORDER — ROSUVASTATIN CALCIUM 20 MG PO TABS: 20.0000 mg | ORAL_TABLET | Freq: Every day | ORAL | 3 refills | Status: AC

## 2024-09-25 NOTE — Patient Instructions (Addendum)
 Medication Instructions:  START HYDRALAZINE  25 MG TWICE A DAY ABOUT 12 HOURS APART   INCREASE YOUR ROSUVASTATIN  TO 20 MG DAILY   Labwork: FASTING LIPID/CMET IN 2 TO 3 MONTHS   Testing/Procedures: NONE  Follow-Up: KRISTIN A PHARM D IN 2 MONTHS   DR Pollard IN 6 MONTHS   If you need a refill on your cardiac medications before your next appointment, please call your pharmacy.  WILL SEND A MESSAGE TO THE SLEEP/SCHEDULING TEAM TO GET YOU SEEN BY DR SHLOMO

## 2024-09-25 NOTE — Telephone Encounter (Signed)
 Patient was seen by Dr Raford today  Does not tolerate CPAP and Dr Raford wanted for him to see Dr Shlomo   Will forward to sleep and scheduling team

## 2024-09-25 NOTE — Progress Notes (Signed)
 "  [facility]pert[facility] Follow Up:    Date:  09/25/2024   ID:  SANAD FEARNOW, DOB 06-30-1986, MRN [id number]  PCP:  Edman Meade PEDLAR, FNP  Cardiologist:  Lynwood Schilling, MD  Nephrologist:  Referring MD: Edman Meade PEDLAR, FNP   CC: Hypertension  History of Present Illness:    Jay Montgomery is a 39 y.o. male with a hx of hypertension, reversible cerebral vasoconstriction syndrome, severe OSA, anxiety and Bipolar disorder here to follow-up.  He first established care in the [facility] 02/2024.  He was seen in the ED 01/2024 after sudden drop in his blood pressure soon after starting verapamil .  Verapamil  was transitioned to amlodipine .  He was first diagnosed with hypertension 07/2023.  Home blood pressure readings were in the 130s over 90s.  He was taking both amlodipine  and nifedipine  at the time.  Benazepril  had been switched to lisinopril .  However this was never started due to concern for interaction with lithium .  He had episodes of heart racing with rates in the 100s to 120s.  He was unable to exercise due to chronic pain.  At his initial visit nifedipine  was stopped and he continued on amlodipine .  Carvedilol  was also added.  Home sleep study was ordered and he was found to have severe OSA.  Both LDL and LP(a) were elevated and was recommended that he start rosuvastatin .  He underwent [facility] C3-C6 07/2024.  Discussed the use of AI scribe hyperlipidemia, OSA, software for clinical note transcription with the patient, who gave verbal consent to proceed.  History of Present Illness Jay Montgomery is a 39 year old male with hypertension who presents with persistent swelling in his feet and difficulty with CPAP use. He is accompanied by a family member. He was referred by Dr. Vannie for a sleep study.  He experiences persistent swelling in his feet, describing them as 'uncontrollably swell,' with difficulty putting on shoes and toes resembling 'sausage  links.' The swelling is constant, even after resting, and extends to his fingers, preventing him from wearing rings. He wonders if the swelling may be related to his medication.  He has a history of hypertension and was previously on spironolactone , which was discontinued. He is currently taking carvedilol  twice a day and nifedipine , which he believes may be contributing to his swelling. He also takes rosuvastatin .  Following surgery, he experienced persistent pain, balance issues, and numbness and tingling. Although the pain has improved slightly, these symptoms continue to impact his daily activities. He attempts to stay active by walking around his house and occasionally outside, but this often results in significant pain the following day.  He uses a CPAP machine for sleep apnea but finds it uncomfortable and suffocating, particularly due to the full-face mask. He often removes it subconsciously during sleep and does not feel rested, waking up every hour. He prefers a nasal mask instead.  His blood pressure has been stable with current medications, and he monitors it at home, reporting similar readings to those obtained in the clinic. He recently had his disability approved, which he finds relieving.  He mentions a high cholesterol level noted in July, for which he is on rosuvastatin .  Previous antihypertensives:    Past Medical History:  Diagnosis Date   Allergy    Anxiety    Arthritis    Chicken pox    Depression    Elevated liver enzymes    Headache    due to CSF leak  Heart murmur    as a child   Spinal stenosis     Past Surgical History:  Procedure Laterality Date   ADENOIDECTOMY     ANTERIOR CERVICAL DECOMP/DISCECTOMY FUSION N/A 07/27/2024   Procedure: ANTERIOR CERVICAL DECOMPRESSION/DISCECTOMY FUSION 3 LEVELS;  Surgeon: Clois Fret, MD;  Location: [facility] ORS;  Service: Neurosurgery;  Laterality: N/A;  C3-6 ANTERIOR CERVICAL DISCECTOMY AND FUSION   BACK SURGERY   08/17/2017   L4-L5   LUMBAR LAMINECTOMY/DECOMPRESSION MICRODISCECTOMY N/A 12/01/2017   Procedure: REPAIR OF CEREBROSPINAL FLUID LEAK and Placement of Lumbar Drain;  Surgeon: Louis Shove, MD;  Location: [facility] OR;  Service: Neurosurgery;  Laterality: N/A;   SPINE SURGERY  07-2017   2 back surgeries   TONSILLECTOMY      Current Medications: Active Medications[1]   Allergies:   Hydrocodone , Hydroxyzine , and Hydrocodone -acetaminophen    Social History   Socioeconomic History   Marital status: Legally Separated    Spouse name: Tasha Jordan   Number of children: Not on file   Years of education: Not on file   Highest education level: 12th grade  Occupational History   Occupation: Designer, Jewellery  Tobacco Use   Smoking status: Former    Current packs/day: 1.00    Average packs/day: 1 pack/day for 20.0 years (20.0 ttl pk-yrs)    Types: Cigarettes   Smokeless tobacco: Never  Vaping Use   Vaping status: Some Days   Substances: Nicotine, Flavoring  Substance and Sexual Activity   Alcohol use: Not Currently    Comment: Haven't had alcohol in a year due to medication   Drug use: No    Comment: vapes w/ delta 8   Sexual activity: Yes    Partners: Female    Birth control/protection: Other-see comments    Comment: Essure  Other Topics Concern   Not on file  Social History Narrative   Pt lives with wife    Pt not working    Social Drivers of Health   Tobacco Use: Medium Risk (09/17/2024)   Patient History    Smoking Tobacco Use: Former    Smokeless Tobacco Use: Never    Passive Exposure: Not on Actuary Strain: Low Risk (03/25/2024)   Overall Financial Resource Strain (CARDIA)    Difficulty of Paying Living Expenses: Not very hard  Recent Concern: Physicist, Medical Strain - High Risk (01/22/2024)   Overall Financial Resource Strain (CARDIA)    Difficulty of Paying Living Expenses: Very hard  Food Insecurity: Food Insecurity Present (07/27/2024)   Epic    Worried  About Programme Researcher, Broadcasting/film/video in the Last Year: Sometimes true    Ran Out of Food in the Last Year: Sometimes true  Transportation Needs: No Transportation Needs (07/27/2024)   Epic    Lack of Transportation (Medical): No    Lack of Transportation (Non-Medical): No  Physical Activity: Inactive (03/25/2024)   Exercise Vital Sign    Days of Exercise per Week: 0 days    Minutes of Exercise per Session: Not on file  Stress: Stress Concern Present (03/27/2024)   [employer] of Occupational Health - Occupational Stress Questionnaire    Feeling of Stress: Very much  Social Connections: Moderately Isolated (03/25/2024)   Social Connection and Isolation Panel    Frequency of Communication with Friends and Family: More than three times a week    Frequency of Social Gatherings with Friends and Family: Once a week    Attends Religious Services: Never    Production Manager of  Clubs or Organizations: No    Attends Banker Meetings: Not on file    Marital Status: Living with partner  Depression (PHQ2-9): High Risk (02/14/2024)   Depression (PHQ2-9)    PHQ-2 Score: 23  Alcohol Screen: Low Risk (03/25/2024)   Alcohol Screen    Last Alcohol Screening Score (AUDIT): 0  Housing: Low Risk (07/27/2024)   Epic    Unable to Pay for Housing in the Last Year: No    Number of Times Moved in the Last Year: 0    Homeless in the Last Year: No  Utilities: Not At Risk (07/27/2024)   Epic    Threatened with loss of utilities: No  Health Literacy: Adequate Health Literacy (03/27/2024)   B1300 Health Literacy    Frequency of need for help with medical instructions: Never     Family History: The patient's family history includes ADD / ADHD in his sister; Anxiety disorder in his sister; Arthritis in his father and mother; Atrial fibrillation in his sister; Bipolar disorder in his mother and sister; COPD in his mother; Diabetes in his father; Emphysema in his mother; Hypertension in his father and mother; Neuropathy  in his father; Schizophrenia in his sister.  ROS:   Please see the history of present illness.     All other systems reviewed and are negative.  EKGs/Labs/Other Studies Reviewed:    EKG:  EKG is not ordered today.    Recent Labs: 03/29/2024: ALT 47; TSH 1.670 05/31/2024: BUN 12; Creatinine, Ser 1.17; Potassium 4.3; Sodium 138 07/18/2024: Hemoglobin 14.6; Platelets 306   Recent Lipid Panel    Component Value Date/Time   CHOL 212 (H) 03/29/2024 0922   TRIG 170 (H) 03/29/2024 0922   HDL 38 (L) 03/29/2024 0922   CHOLHDL 5.6 (H) 03/29/2024 0922   CHOLHDL 7.1 08/11/2023 0504   VLDL 35 08/11/2023 0504   LDLCALC 143 (H) 03/29/2024 0922    Physical Exam:   VS:  BP 122/78 (BP Location: Right Arm, Patient Position: Sitting, Cuff Size: Large)   Pulse 88   Ht 6' 1 (1.854 m)   Wt 261 lb 3.2 oz (118.5 kg)   SpO2 99%   BMI 34.46 kg/m  , BMI Body mass index is 34.46 kg/m. GENERAL:  Well appearing HEENT: Pupils equal round and reactive, fundi not visualized, oral mucosa unremarkable NECK:  No jugular venous distention, waveform within normal limits, carotid upstroke brisk and symmetric, no bruits, no thyromegaly LUNGS:  Clear to auscultation bilaterally HEART:  RRR.  PMI not displaced or sustained,S1 and S2 within normal limits, no S3, no S4, no clicks, no rubs, no murmurs ABD:  Flat, positive bowel sounds normal in frequency in pitch, no bruits, no rebound, no guarding, no midline pulsatile mass, no hepatomegaly, no splenomegaly EXT:  2 plus pulses throughout, 1+ LE edema to the ankles bilaterally, no cyanosis no clubbing SKIN:  No rashes no nodules NEURO:  Cranial nerves II through XII grossly intact, motor grossly intact throughout PSYCH:  Cognitively intact, oriented to person place and time   ASSESSMENT/PLAN:    Assessment and Plan Assessment & Plan # Hypertension # Peripheral edema secondary to antihypertensive therapy Blood pressure stable within target range. However, he  now has lower extremity edema that is likely due to the nifedipine .  He has no heart failure symptoms.  Medication options are limited given that many medicines interfere with his lithium . - Continue carvedilol  as prescribed. - Monitor blood pressure regularly at home. - Contact clinic  if blood pressure becomes unstable. - Discontinued nifedipine . - Initiated hydralazine  25 mg twice a day. - Monitor for improvement in edema.  # Hyperlipidemia Managed with rosuvastatin . Recent cholesterol levels high despite current dosing. - Increased rosuvastatin  to 20 mg daily. - Recheck lipid panel in three months.  # Obstructive sleep apnea Managed with CPAP, but significant discomfort and intolerance to full face mask reported. - Sent message to sleep medicine team to explore alternative CPAP options, such as nasal masks or Inspire device. - Coordinate with sleep medicine team to assess insurance coverage for alternative treatments.   Screening for Secondary Hypertension:     Relevant Labs/Studies:    Latest Ref Rng & Units 05/31/2024    9:27 AM 03/29/2024    9:22 AM 03/15/2024    9:36 AM  Basic Labs  Sodium 134 - 144 mmol/L 138  140  137   Potassium 3.5 - 5.2 mmol/L 4.3  4.3  4.7   Creatinine 0.76 - 1.27 mg/dL 8.82  8.90  8.93        Latest Ref Rng & Units 03/29/2024    9:22 AM 03/15/2024    9:36 AM  Thyroid    TSH 0.450 - 4.500 uIU/mL 1.670  1.220        Latest Ref Rng & Units 03/15/2024    9:36 AM  Renin/Aldosterone   Aldosterone 0.0 - 30.0 ng/dL 63.8   Aldos/Renin Ratio 0.0 - 30.0 6.5        Latest Ref Rng & Units 03/15/2024    9:36 AM  Metanephrines/Catecholamines   Epinephrine  0.0 - 55.4 pg/mL 43.2   Norepinephrine 115 - 524 pg/mL 834   Dopamine 0.0 - 36.7 pg/mL 18.1   Metanephrines 0.0 - 88.0 pg/mL <25.0   Normetanephrines  0.0 - 210.1 pg/mL 123.4     Disposition:    FU with PharmD in 2 months. Me in 6 months.     Medication Adjustments/Labs and Tests Ordered: Current  medicines are reviewed at length with the patient today.  Concerns regarding medicines are outlined above.  No orders of the defined types were placed in this encounter.  No orders of the defined types were placed in this encounter.    Signed, Annabella Scarce, MD  09/25/2024 2:12 PM    [facility]     [1]  Current Meds  Medication Sig   amphetamine -dextroamphetamine  (ADDERALL) 30 MG tablet Take one tablet daily.   ARIPiprazole  (ABILIFY ) 10 MG tablet Take 1 tablet (10 mg total) by mouth 2 (two) times daily.   buPROPion  (WELLBUTRIN  XL) 150 MG 24 hr tablet Take two tablets every morning.   busPIRone  (BUSPAR ) 10 MG tablet Take 1 tablet (10 mg total) by mouth 3 (three) times daily.   carvedilol  (COREG ) 12.5 MG tablet Take 1 tablet (12.5 mg total) by mouth 2 (two) times daily.   divalproex  (DEPAKOTE  ER) 500 MG 24 hr tablet Take 1 tablet (500 mg total) by mouth daily.   gabapentin  (NEURONTIN ) 300 MG capsule Take 1 capsule (300 mg total) by mouth at bedtime.   lithium  carbonate 150 MG capsule Take 1 capsule (150 mg total) by mouth at bedtime.   methocarbamol  (ROBAXIN ) 500 MG tablet Take 1 tablet (500 mg total) by mouth every 8 (eight) hours as needed for muscle spasms.   naloxone  (NARCAN ) nasal spray 4 mg/0.1 mL Place 1 spray into the nose once.   NIFEdipine  (PROCARDIA  XL/NIFEDICAL-XL) 90 MG 24 hr tablet Take 1 tablet (90 mg total) by  mouth daily.   rosuvastatin  (CRESTOR ) 10 MG tablet Take 1 tablet (10 mg total) by mouth daily.   "

## 2024-10-02 NOTE — Telephone Encounter (Signed)
 Will forward to the sleep schedulers Chloe and Alicia.

## 2024-10-09 ENCOUNTER — Other Ambulatory Visit: Payer: Self-pay | Admitting: Neurosurgery

## 2024-10-09 DIAGNOSIS — G959 Disease of spinal cord, unspecified: Secondary | ICD-10-CM

## 2024-10-09 NOTE — Progress Notes (Unsigned)
 "  Subjective:    Patient ID: Jay Montgomery, male    DOB: May 23, 1986, 39 y.o.   MRN: [id number]   Chief Complaint: No chief complaint on file.    HPI:  Jay Montgomery is a 39 y.o. who identifies as a male who was assigned male at birth.   Social history: Lives with: girlfriend Work history: disability   Comes in today for follow up of the following chronic medical issues:  1. Primary hypertension No c/o chest pain, sob or headache. Does not check blood pressure at home. Is on coreg  12.6mg  daily.  BP Readings from Last 3 Encounters:  09/25/24 122/78  09/05/24 136/88  08/08/24 (!) 140/88     2. Attention deficit hyperactivity disorder (ADHD), combined type Is on adderal daily  to help him concentrate. Denies medication side effects.  3. Bipolar 1 disorder (HCC) 4. Major depressive disorder, recurrent episode, moderate (HCC) 5. GAD (generalized anxiety disorder) 6. Mixed obsessional thoughts and acts 7. PTSD (post-traumatic stress disorder) Patient is on lithium ,abilify , wellbutrin  and buspar . He says he has been doing well lately. Sees psych monthly    10/10/2024    8:51 AM 02/14/2024   10:18 AM 02/09/2024    1:24 PM  Depression screen PHQ 2/9  Decreased Interest 3  3  Down, Depressed, Hopeless 2  3  PHQ - 2 Score 5  6  Altered sleeping 3  3  Tired, decreased energy 3  3  Change in appetite 3  0  Feeling bad or failure about yourself  2  3  Trouble concentrating 2  3  Moving slowly or fidgety/restless 1  3  Suicidal thoughts 0  1  PHQ-9 Score 19  22   Difficult doing work/chores Extremely dIfficult  Extremely dIfficult     Information is confidential and restricted. Go to Review Flowsheets to unlock data.   Data saved with a previous flowsheet row definition      10/10/2024    8:51 AM 02/14/2024   10:17 AM 02/09/2024    1:25 PM 01/23/2024   10:39 AM  GAD 7 : Generalized Anxiety Score  Nervous, Anxious, on Edge 3  3  3    Control/stop worrying 3  3  3     Worry too much - different things 3  3  3    Trouble relaxing 3  3  3    Restless 2  3  3    Easily annoyed or irritable 3  3  3    Afraid - awful might happen 1  3  3    Total GAD 7 Score 18  21 21   Anxiety Difficulty Extremely difficult  Extremely difficult Extremely difficult     Information is confidential and restricted. Go to Review Flowsheets to unlock data.   Data saved with a previous flowsheet row definition      8. SIRS (systemic inflammatory response syndrome) (HCC) Is on neurontin  at night to help with pain and burning sensation of legs. Hands are numb and tingling also. Has very week lower ext. Just had back surgery in November which he is still recovering from.  9. bradycardia Had appointment with cardiology on 09/25/24- office note was reviewed and no change was made to plan of care.  10. Obstructive sleep apnea Has not been wear cpap because the  current mask he is using feels like he is suffocating. He cannot tell a difference with or without machine.  New complaints: None today  Allergies[1] Outpatient Encounter Medications as of 10/10/2024  Medication Sig   amphetamine -dextroamphetamine  (ADDERALL) 30 MG tablet Take one tablet daily.   ARIPiprazole  (ABILIFY ) 10 MG tablet Take 1 tablet (10 mg total) by mouth 2 (two) times daily.   buPROPion  (WELLBUTRIN  XL) 150 MG 24 hr tablet Take two tablets every morning.   busPIRone  (BUSPAR ) 10 MG tablet Take 1 tablet (10 mg total) by mouth 3 (three) times daily.   carvedilol  (COREG ) 12.5 MG tablet Take 1 tablet (12.5 mg total) by mouth 2 (two) times daily.   divalproex  (DEPAKOTE  ER) 500 MG 24 hr tablet Take 1 tablet (500 mg total) by mouth daily.   docusate sodium  (COLACE) 100 MG capsule Take 1 capsule (100 mg total) by mouth 2 (two) times daily. (Patient not taking: Reported on 09/25/2024)   gabapentin  (NEURONTIN ) 300 MG capsule Take 1 capsule (300 mg total) by mouth at bedtime.   hydrALAZINE  (APRESOLINE ) 25 MG tablet Take 1 tablet  (25 mg total) by mouth in the morning and at bedtime.   lithium  carbonate 150 MG capsule Take 1 capsule (150 mg total) by mouth at bedtime.   methocarbamol  (ROBAXIN ) 500 MG tablet Take 1 tablet (500 mg total) by mouth every 8 (eight) hours as needed for muscle spasms.   naloxone  (NARCAN ) nasal spray 4 mg/0.1 mL Place 1 spray into the nose once.   rosuvastatin  (CRESTOR ) 20 MG tablet Take 1 tablet (20 mg total) by mouth daily.   senna (SENOKOT) 8.6 MG TABS tablet Take 1 tablet (8.6 mg total) by mouth daily. (Patient not taking: Reported on 09/25/2024)   Vitamin D , Ergocalciferol , (DRISDOL ) 1.25 MG (50000 UNIT) CAPS capsule Take 1 capsule (50,000 Units total) by mouth every 7 (seven) days.   No facility-administered encounter medications on file as of 10/10/2024.    Past Surgical History:  Procedure Laterality Date   ADENOIDECTOMY     ANTERIOR CERVICAL DECOMP/DISCECTOMY FUSION N/A 07/27/2024   Procedure: ANTERIOR CERVICAL DECOMPRESSION/DISCECTOMY FUSION 3 LEVELS;  Surgeon: Clois Fret, MD;  Location: [facility] ORS;  Service: Neurosurgery;  Laterality: N/A;  C3-6 ANTERIOR CERVICAL DISCECTOMY AND FUSION   BACK SURGERY  08/17/2017   L4-L5   LUMBAR LAMINECTOMY/DECOMPRESSION MICRODISCECTOMY N/A 12/01/2017   Procedure: REPAIR OF CEREBROSPINAL FLUID LEAK and Placement of Lumbar Drain;  Surgeon: Louis Shove, MD;  Location: [facility] OR;  Service: Neurosurgery;  Laterality: N/A;   SPINE SURGERY  07-2017   2 back surgeries   TONSILLECTOMY      Family History  Problem Relation Age of Onset   Arthritis Mother    Hypertension Mother    COPD Mother    Emphysema Mother    Bipolar disorder Mother    Hypertension Father    Arthritis Father    Diabetes Father    Neuropathy Father    Bipolar disorder Sister    Schizophrenia Sister    ADD / ADHD Sister    Anxiety disorder Sister    Atrial fibrillation Sister       Review of Systems  Constitutional:  Negative for diaphoresis.  Eyes:  Negative for pain.   Respiratory:  Negative for shortness of breath.   Cardiovascular:  Negative for chest pain, palpitations and leg swelling.  Gastrointestinal:  Negative for abdominal pain.  Endocrine: Negative for polydipsia.  Musculoskeletal:  Positive for back pain (secondary to surgery).  Skin:  Negative for rash.  Neurological:  Negative for dizziness, weakness and headaches.  Hematological:  Does not bruise/bleed easily.  All other systems reviewed and are negative.      Objective:  Physical Exam Vitals and nursing note reviewed.  Constitutional:      Appearance: Normal appearance. He is well-developed.  HENT:     Head: Normocephalic.     Nose: Nose normal.     Mouth/Throat:     Mouth: Mucous membranes are moist.     Pharynx: Oropharynx is clear.  Eyes:     Pupils: Pupils are equal, round, and reactive to light.  Neck:     Thyroid : No thyroid  mass or thyromegaly.     Vascular: No carotid bruit or JVD.     Trachea: Phonation normal.  Cardiovascular:     Rate and Rhythm: Normal rate and regular rhythm.  Pulmonary:     Effort: Pulmonary effort is normal. No respiratory distress.     Breath sounds: Normal breath sounds.  Abdominal:     General: Bowel sounds are normal.     Palpations: Abdomen is soft.     Tenderness: There is no abdominal tenderness.  Musculoskeletal:        General: Normal range of motion.     Cervical back: Normal range of motion and neck supple.     Comments: Ambulating with cane- gait is slow and steady  Lymphadenopathy:     Cervical: No cervical adenopathy.  Skin:    General: Skin is warm and dry.  Neurological:     Mental Status: He is alert and oriented to person, place, and time.  Psychiatric:        Behavior: Behavior normal.        Thought Content: Thought content normal.        Judgment: Judgment normal.    BP 130/87   Pulse 93   Ht 6' 1 (1.854 m)   Wt 254 lb (115.2 kg)   SpO2 99%   BMI 33.51 kg/m        Assessment & Plan:  JOEANGEL JEANPAUL comes in today with chief complaint of Medical Management of Chronic Issues (Five month follow up )   Diagnosis and orders addressed:  1. Primary hypertension (Primary) Continue current meds Dash diet Keep check of blood pressure at home  2. Attention deficit hyperactivity disorder (ADHD), combined type 3. Bipolar 1 disorder (HCC) 4. Major depressive disorder, recurrent episode, moderate (HCC) 5. GAD (generalized anxiety disorder) 6. Mixed obsessional thoughts and acts 7. PTSD (post-traumatic stress disorder) Continue all current meds Have lithium  level checked today Keep monthly follow up with psych  8. SIRS (systemic inflammatory response syndrome) (HCC) Continue neurontin  Fall prevention  9. Bradycardia Report any near syncopial episodes  10. Attention deficit hyperactivity disorder (ADHD), unspecified ADHD type Gets refills from psych  11. Obstructive sleep apnea syndrome Prescription written for new CPAP mask Encouraged to wear nightly   Labs pending Health Maintenance reviewed Diet and exercise encouraged  Follow up plan: 6 MONTHS   Mary-Margaret Gladis, FNP     [1]  Allergies Allergen Reactions   Hydrocodone  Itching   Hydroxyzine  Other (See Comments)    Dizziness, mental fogginess, nausea   Nifedipine  Swelling   Hydrocodone -Acetaminophen  Hives, Itching and Rash   "

## 2024-10-10 ENCOUNTER — Ambulatory Visit: Admitting: Nurse Practitioner

## 2024-10-10 ENCOUNTER — Encounter: Payer: Self-pay | Admitting: Nurse Practitioner

## 2024-10-10 ENCOUNTER — Encounter: Payer: Self-pay | Admitting: Adult Health

## 2024-10-10 ENCOUNTER — Telehealth: Admitting: Adult Health

## 2024-10-10 VITALS — BP 130/87 | HR 93 | Ht 73.0 in | Wt 254.0 lb

## 2024-10-10 DIAGNOSIS — R001 Bradycardia, unspecified: Secondary | ICD-10-CM | POA: Diagnosis not present

## 2024-10-10 DIAGNOSIS — F4310 Post-traumatic stress disorder, unspecified: Secondary | ICD-10-CM

## 2024-10-10 DIAGNOSIS — F902 Attention-deficit hyperactivity disorder, combined type: Secondary | ICD-10-CM

## 2024-10-10 DIAGNOSIS — G4733 Obstructive sleep apnea (adult) (pediatric): Secondary | ICD-10-CM | POA: Diagnosis not present

## 2024-10-10 DIAGNOSIS — F319 Bipolar disorder, unspecified: Secondary | ICD-10-CM

## 2024-10-10 DIAGNOSIS — F331 Major depressive disorder, recurrent, moderate: Secondary | ICD-10-CM

## 2024-10-10 DIAGNOSIS — F422 Mixed obsessional thoughts and acts: Secondary | ICD-10-CM | POA: Diagnosis not present

## 2024-10-10 DIAGNOSIS — Z0389 Encounter for observation for other suspected diseases and conditions ruled out: Secondary | ICD-10-CM

## 2024-10-10 DIAGNOSIS — I10 Essential (primary) hypertension: Secondary | ICD-10-CM | POA: Diagnosis not present

## 2024-10-10 DIAGNOSIS — F909 Attention-deficit hyperactivity disorder, unspecified type: Secondary | ICD-10-CM | POA: Diagnosis not present

## 2024-10-10 DIAGNOSIS — R6510 Systemic inflammatory response syndrome (SIRS) of non-infectious origin without acute organ dysfunction: Secondary | ICD-10-CM | POA: Diagnosis not present

## 2024-10-10 DIAGNOSIS — F411 Generalized anxiety disorder: Secondary | ICD-10-CM | POA: Diagnosis not present

## 2024-10-10 NOTE — Patient Instructions (Signed)

## 2024-10-10 NOTE — Progress Notes (Signed)
 Patient no show appointment. ? ?

## 2024-10-10 NOTE — Progress Notes (Deleted)
 Jay Montgomery [id number] July 21, 1986 39 y.o.  Virtual Visit via Video Note  I connected with pt @ on 10/10/24 at  3:30 PM EST by a video enabled telemedicine application and verified that I am speaking with the correct person using two identifiers.   I discussed the limitations of evaluation and management by telemedicine and the availability of in person appointments. The patient expressed understanding and agreed to proceed.  I discussed the assessment and treatment plan with the patient. The patient was provided an opportunity to ask questions and all were answered. The patient agreed with the plan and demonstrated an understanding of the instructions.   The patient was advised to call back or seek an in-person evaluation if the symptoms worsen or if the condition fails to improve as anticipated.  I provided *** minutes of non-face-to-face time during this encounter.  The patient was located at home.  The provider was located at [name].   Angeline LOISE Sayers, NP   Subjective:   Patient ID:  Jay Montgomery is a 39 y.o. (DOB 06/15/1986) male.  Chief Complaint: No chief complaint on file.   HPI Jay Montgomery presents for follow-up of ***   Review of Systems:  Review of Systems  Medications: {medication reviewed/display:3041432}  Current Outpatient Medications  Medication Sig Dispense Refill   amphetamine -dextroamphetamine  (ADDERALL) 30 MG tablet Take one tablet daily. 30 tablet 0   ARIPiprazole  (ABILIFY ) 10 MG tablet Take 1 tablet (10 mg total) by mouth 2 (two) times daily. 60 tablet 2   buPROPion  (WELLBUTRIN  XL) 150 MG 24 hr tablet Take two tablets every morning. 60 tablet 2   busPIRone  (BUSPAR ) 10 MG tablet Take 1 tablet (10 mg total) by mouth 3 (three) times daily. 90 tablet 2   carvedilol  (COREG ) 12.5 MG tablet Take 1 tablet (12.5 mg total) by mouth 2 (two) times daily. 180 tablet 3   divalproex  (DEPAKOTE  ER) 500 MG 24 hr tablet Take 1 tablet (500  mg total) by mouth daily. 30 tablet 5   docusate sodium  (COLACE) 100 MG capsule Take 1 capsule (100 mg total) by mouth 2 (two) times daily. (Patient not taking: Reported on 09/25/2024) 10 capsule 0   gabapentin  (NEURONTIN ) 300 MG capsule Take 1 capsule (300 mg total) by mouth at bedtime. 90 capsule 3   hydrALAZINE  (APRESOLINE ) 25 MG tablet Take 1 tablet (25 mg total) by mouth in the morning and at bedtime. 180 tablet 1   lithium  carbonate 150 MG capsule Take 1 capsule (150 mg total) by mouth at bedtime. 30 capsule 2   methocarbamol  (ROBAXIN ) 500 MG tablet Take 1 tablet (500 mg total) by mouth every 8 (eight) hours as needed for muscle spasms. 60 tablet 2   naloxone  (NARCAN ) nasal spray 4 mg/0.1 mL Place 1 spray into the nose once.     rosuvastatin  (CRESTOR ) 20 MG tablet Take 1 tablet (20 mg total) by mouth daily. 90 tablet 3   senna (SENOKOT) 8.6 MG TABS tablet Take 1 tablet (8.6 mg total) by mouth daily. (Patient not taking: Reported on 09/25/2024) 120 tablet 0   Vitamin D , Ergocalciferol , (DRISDOL ) 1.25 MG (50000 UNIT) CAPS capsule Take 1 capsule (50,000 Units total) by mouth every 7 (seven) days. 27 capsule 1   No current facility-administered medications for this visit.    Medication Side Effects: {Medication Side Effects (Optional):21014029}  Allergies: Allergies[1]  Past Medical History:  Diagnosis Date   Allergy    Anxiety    Arthritis  Chicken pox    Depression    Elevated liver enzymes    Headache    due to CSF leak   Heart murmur    as a child   Spinal stenosis     Family History  Problem Relation Age of Onset   Arthritis Mother    Hypertension Mother    COPD Mother    Emphysema Mother    Bipolar disorder Mother    Hypertension Father    Arthritis Father    Diabetes Father    Neuropathy Father    Bipolar disorder Sister    Schizophrenia Sister    ADD / ADHD Sister    Anxiety disorder Sister    Atrial fibrillation Sister     Social History   Socioeconomic  History   Marital status: Legally Separated    Spouse name: Tasha Jordan   Number of children: Not on file   Years of education: Not on file   Highest education level: 12th grade  Occupational History   Occupation: Designer, Jewellery  Tobacco Use   Smoking status: Former    Current packs/day: 1.00    Average packs/day: 1 pack/day for 20.0 years (20.0 ttl pk-yrs)    Types: Cigarettes   Smokeless tobacco: Never  Vaping Use   Vaping status: Some Days   Substances: Nicotine, Flavoring  Substance and Sexual Activity   Alcohol use: Not Currently    Comment: Haven't had alcohol in a year due to medication   Drug use: No    Comment: vapes w/ delta 8   Sexual activity: Yes    Partners: Female    Birth control/protection: Other-see comments    Comment: Essure  Other Topics Concern   Not on file  Social History Narrative   Pt lives with wife    Pt not working    Social Drivers of Health   Tobacco Use: Medium Risk (10/10/2024)   Patient History    Smoking Tobacco Use: Former    Smokeless Tobacco Use: Never    Passive Exposure: Not on Actuary Strain: Low Risk (03/25/2024)   Overall Financial Resource Strain (CARDIA)    Difficulty of Paying Living Expenses: Not very hard  Recent Concern: Physicist, Medical Strain - High Risk (01/22/2024)   Overall Financial Resource Strain (CARDIA)    Difficulty of Paying Living Expenses: Very hard  Food Insecurity: Food Insecurity Present (07/27/2024)   Epic    Worried About Programme Researcher, Broadcasting/film/video in the Last Year: Sometimes true    Ran Out of Food in the Last Year: Sometimes true  Transportation Needs: No Transportation Needs (07/27/2024)   Epic    Lack of Transportation (Medical): No    Lack of Transportation (Non-Medical): No  Physical Activity: Inactive (03/25/2024)   Exercise Vital Sign    Days of Exercise per Week: 0 days    Minutes of Exercise per Session: Not on file  Stress: Stress Concern Present (03/27/2024)   [employer]  of Occupational Health - Occupational Stress Questionnaire    Feeling of Stress: Very much  Social Connections: Moderately Isolated (03/25/2024)   Social Connection and Isolation Panel    Frequency of Communication with Friends and Family: More than three times a week    Frequency of Social Gatherings with Friends and Family: Once a week    Attends Religious Services: Never    Database Administrator or Organizations: No    Attends Banker Meetings: Not on file  Marital Status: Living with partner  Intimate Partner Violence: Not At Risk (07/27/2024)   Epic    Fear of Current or Ex-Partner: No    Emotionally Abused: No    Physically Abused: No    Sexually Abused: No  Depression (PHQ2-9): High Risk (10/10/2024)   Depression (PHQ2-9)    PHQ-2 Score: 19  Alcohol Screen: Low Risk (03/25/2024)   Alcohol Screen    Last Alcohol Screening Score (AUDIT): 0  Housing: Low Risk (07/27/2024)   Epic    Unable to Pay for Housing in the Last Year: No    Number of Times Moved in the Last Year: 0    Homeless in the Last Year: No  Utilities: Not At Risk (07/27/2024)   Epic    Threatened with loss of utilities: No  Health Literacy: Adequate Health Literacy (03/27/2024)   B1300 Health Literacy    Frequency of need for help with medical instructions: Never    Past Medical History, Surgical history, Social history, and Family history were reviewed and updated as appropriate.   Please see review of systems for further details on the patient's review from today.   Objective:   Physical Exam:  There were no vitals taken for this visit.  Physical Exam  Lab Review:     Component Value Date/Time   NA 138 05/31/2024 0927   K 4.3 05/31/2024 0927   CL 100 05/31/2024 0927   CO2 19 (L) 05/31/2024 0927   GLUCOSE 109 (H) 05/31/2024 0927   GLUCOSE 104 (H) 01/26/2024 1703   BUN 12 05/31/2024 0927   CREATININE 1.17 05/31/2024 0927   CALCIUM  9.2 05/31/2024 0927   PROT 7.1 03/29/2024 0922    ALBUMIN 4.7 03/29/2024 0922   AST 24 03/29/2024 0922   ALT 47 (H) 03/29/2024 0922   ALKPHOS 128 (H) 03/29/2024 0922   BILITOT 0.4 03/29/2024 0922   GFRNONAA >60 01/26/2024 1703   GFRAA >60 06/23/2020 0620       Component Value Date/Time   WBC 9.6 07/18/2024 1109   RBC 4.85 07/18/2024 1109   HGB 14.6 07/18/2024 1109   HGB 15.6 03/29/2024 0922   HCT 43.3 07/18/2024 1109   HCT 47.5 03/29/2024 0922   PLT 306 07/18/2024 1109   PLT 290 03/29/2024 0922   MCV 89.3 07/18/2024 1109   MCV 92 03/29/2024 0922   MCH 30.1 07/18/2024 1109   MCHC 33.7 07/18/2024 1109   RDW 12.6 07/18/2024 1109   RDW 13.4 03/29/2024 0922   LYMPHSABS 2.0 03/29/2024 0922   MONOABS 0.7 08/10/2023 0844   EOSABS 0.2 03/29/2024 0922   BASOSABS 0.1 03/29/2024 0922    No results found for: POCLITH, LITHIUM    No results found for: PHENYTOIN, PHENOBARB, VALPROATE, CBMZ   .res Assessment: Plan:    There are no diagnoses linked to this encounter.   Please see After Visit Summary for patient specific instructions.  Future Appointments  Date Time Provider Department Center  10/10/2024  3:30 PM Zakary Kimura, Angeline Mattocks, NP CP-CP None  10/16/2024  8:00 AM [facility] DG 1 [facility] [place]  10/16/2024  8:30 AM Clois Fret, MD [facility] [place]  11/13/2024  2:00 PM Herschel Allean CROME, RPH-CPP [facility] [id number]  04/03/2025  9:20 AM Gladis Mustard, FNP [facility] [address]    No orders of the defined types were placed in this encounter.     -------------------------------     [1]  Allergies Allergen Reactions   Hydrocodone  Itching   Hydroxyzine   Other (See Comments)    Dizziness, mental fogginess, nausea   Nifedipine  Swelling   Hydrocodone -Acetaminophen  Hives, Itching and Rash

## 2024-10-15 ENCOUNTER — Telehealth: Payer: Self-pay

## 2024-10-15 NOTE — Telephone Encounter (Signed)
 If pt continues Bupropion  XL 150 mg take 2 every am, please change to 300 mg tablet to avoid a prior authorization.

## 2024-10-16 ENCOUNTER — Other Ambulatory Visit

## 2024-10-16 ENCOUNTER — Encounter: Admitting: Neurosurgery

## 2024-10-16 ENCOUNTER — Encounter: Admitting: Orthopedic Surgery

## 2024-10-17 ENCOUNTER — Encounter: Payer: Self-pay | Admitting: Adult Health

## 2024-10-17 ENCOUNTER — Telehealth: Admitting: Adult Health

## 2024-10-17 DIAGNOSIS — F319 Bipolar disorder, unspecified: Secondary | ICD-10-CM

## 2024-10-17 DIAGNOSIS — F909 Attention-deficit hyperactivity disorder, unspecified type: Secondary | ICD-10-CM

## 2024-10-17 DIAGNOSIS — F411 Generalized anxiety disorder: Secondary | ICD-10-CM | POA: Diagnosis not present

## 2024-10-17 DIAGNOSIS — G4700 Insomnia, unspecified: Secondary | ICD-10-CM

## 2024-10-17 NOTE — Progress Notes (Signed)
 Jay Montgomery [id number] [birth year] 39 y.o.  Virtual Visit via Video Note  I connected with pt @ on 10/17/24 at  5:00 PM EST by a video enabled telemedicine application and verified that I am speaking with the correct person using two identifiers.   I discussed the limitations of evaluation and management by telemedicine and the availability of in person appointments. The patient expressed understanding and agreed to proceed.  I discussed the assessment and treatment plan with the patient. The patient was provided an opportunity to ask questions and all were answered. The patient agreed with the plan and demonstrated an understanding of the instructions.   The patient was advised to call back or seek an in-person evaluation if the symptoms worsen or if the condition fails to improve as anticipated.  I provided 25 minutes of non-face-to-face time during this encounter.  The patient was located at home.  The provider was located at [facility].   Jay LOISE Sayers, NP   Subjective:   Patient ID:  Jay Montgomery is a 39 y.o. (DOB 1986-09-14) male.  Chief Complaint: No chief complaint on file.   HPI Jay Montgomery presents for follow-up of GAD, insomnia, ADHD, and BPD 1.  Describes mood today as up and down. Pleasant. Flat. Denies tearfulness. Mood symptoms - reports decreased depression - not as much. Reports low interest and motivation. Reports feeling anxious at times - some days I'm on pins and needles. Denies irritability - it's more manageable here lately. Denies recent panic attacks. Reports some worry, rumination and over thinking. Reports possible mania - has been doing more - spending more.  Reports mood as variable - it's up and down. Stating I feel like I'm managing as well as I can for the situation I'm in. Taking medications as prescribed. Energy levels improved - it's up and down. Active, does not have a regular exercise routine with  physical disabilities. Enjoys some usual interests and activities. Married. Lives with wife and family. Appetite adequate. Weight gain - 256 pounds. Reports sleep is horrible - using CPAP and is struggling with the mask. Reports 5 hours of broken sleep. Reports napping throughout the day - a lot. Focus and concentration pretty good. Completing tasks. Reports managing household tasks. Reports he was awarded with SSDI with current physical limitations and medical issues for 18 months. Denies SI or HI. Denies AH or VH. Denies self harm. Denies substance use.   Review of Systems:  Review of Systems  Musculoskeletal:  Negative for gait problem.  Neurological:  Negative for tremors.  Psychiatric/Behavioral:         Please refer to HPI    Medications: I have reviewed the patient's current medications.  Current Outpatient Medications  Medication Sig Dispense Refill   amphetamine -dextroamphetamine  (ADDERALL) 30 MG tablet Take one tablet daily. 30 tablet 0   ARIPiprazole  (ABILIFY ) 10 MG tablet Take 1 tablet (10 mg total) by mouth 2 (two) times daily. 60 tablet 2   buPROPion  (WELLBUTRIN  XL) 150 MG 24 hr tablet Take two tablets every morning. 60 tablet 2   busPIRone  (BUSPAR ) 10 MG tablet Take 1 tablet (10 mg total) by mouth 3 (three) times daily. 90 tablet 2   carvedilol  (COREG ) 12.5 MG tablet Take 1 tablet (12.5 mg total) by mouth 2 (two) times daily. 180 tablet 3   divalproex  (DEPAKOTE  ER) 500 MG 24 hr tablet Take 1 tablet (500 mg total) by mouth daily. 30 tablet 5   docusate sodium  (COLACE) 100 MG  capsule Take 1 capsule (100 mg total) by mouth 2 (two) times daily. (Patient not taking: Reported on 09/25/2024) 10 capsule 0   gabapentin  (NEURONTIN ) 300 MG capsule Take 1 capsule (300 mg total) by mouth at bedtime. 90 capsule 3   hydrALAZINE  (APRESOLINE ) 25 MG tablet Take 1 tablet (25 mg total) by mouth in the morning and at bedtime. 180 tablet 1   lithium  carbonate 150 MG capsule Take 1  capsule (150 mg total) by mouth at bedtime. 30 capsule 2   methocarbamol  (ROBAXIN ) 500 MG tablet Take 1 tablet (500 mg total) by mouth every 8 (eight) hours as needed for muscle spasms. 60 tablet 2   naloxone  (NARCAN ) nasal spray 4 mg/0.1 mL Place 1 spray into the nose once.     rosuvastatin  (CRESTOR ) 20 MG tablet Take 1 tablet (20 mg total) by mouth daily. 90 tablet 3   senna (SENOKOT) 8.6 MG TABS tablet Take 1 tablet (8.6 mg total) by mouth daily. (Patient not taking: Reported on 09/25/2024) 120 tablet 0   Vitamin D , Ergocalciferol , (DRISDOL ) 1.25 MG (50000 UNIT) CAPS capsule Take 1 capsule (50,000 Units total) by mouth every 7 (seven) days. 27 capsule 1   No current facility-administered medications for this visit.    Medication Side Effects: None  Allergies: Allergies[1]  Past Medical History:  Diagnosis Date   Allergy    Anxiety    Arthritis    Chicken pox    Depression    Elevated liver enzymes    Headache    due to CSF leak   Heart murmur    as a child   Spinal stenosis     Family History  Problem Relation Age of Onset   Arthritis Mother    Hypertension Mother    COPD Mother    Emphysema Mother    Bipolar disorder Mother    Hypertension Father    Arthritis Father    Diabetes Father    Neuropathy Father    Bipolar disorder Sister    Schizophrenia Sister    ADD / ADHD Sister    Anxiety disorder Sister    Atrial fibrillation Sister     Social History   Socioeconomic History   Marital status: Legally Separated    Spouse name: Jay Montgomery   Number of children: Not on file   Years of education: Not on file   Highest education level: 12th grade  Occupational History   Occupation: Designer, Jewellery  Tobacco Use   Smoking status: Former    Current packs/day: 1.00    Average packs/day: 1 pack/day for 20.0 years (20.0 ttl pk-yrs)    Types: Cigarettes   Smokeless tobacco: Never  Vaping Use   Vaping status: Some Days   Substances: Nicotine, Flavoring   Substance and Sexual Activity   Alcohol use: Not Currently    Comment: Haven't had alcohol in a year due to medication   Drug use: No    Comment: vapes w/ delta 8   Sexual activity: Yes    Partners: Female    Birth control/protection: Other-see comments    Comment: Essure  Other Topics Concern   Not on file  Social History Narrative   Pt lives with wife    Pt not working    Social Drivers of Health   Tobacco Use: Medium Risk (10/10/2024)   Patient History    Smoking Tobacco Use: Former    Smokeless Tobacco Use: Never    Passive Exposure: Not on Hospital Doctor  Resource Strain: Low Risk (03/25/2024)   Overall Financial Resource Strain (CARDIA)    Difficulty of Paying Living Expenses: Not very hard  Recent Concern: Financial Resource Strain - High Risk (01/22/2024)   Overall Financial Resource Strain (CARDIA)    Difficulty of Paying Living Expenses: Very hard  Food Insecurity: Food Insecurity Present (07/27/2024)   Epic    Worried About Programme Researcher, Broadcasting/film/video in the Last Year: Sometimes true    Ran Out of Food in the Last Year: Sometimes true  Transportation Needs: No Transportation Needs (07/27/2024)   Epic    Lack of Transportation (Medical): No    Lack of Transportation (Non-Medical): No  Physical Activity: Inactive (03/25/2024)   Exercise Vital Sign    Days of Exercise per Week: 0 days    Minutes of Exercise per Session: Not on file  Stress: Stress Concern Present (03/27/2024)   [employer] of Occupational Health - Occupational Stress Questionnaire    Feeling of Stress: Very much  Social Connections: Moderately Isolated (03/25/2024)   Social Connection and Isolation Panel    Frequency of Communication with Friends and Family: More than three times a week    Frequency of Social Gatherings with Friends and Family: Once a week    Attends Religious Services: Never    Database Administrator or Organizations: No    Attends Engineer, Structural: Not on file    Marital  Status: Living with partner  Intimate Partner Violence: Not At Risk (07/27/2024)   Epic    Fear of Current or Ex-Partner: No    Emotionally Abused: No    Physically Abused: No    Sexually Abused: No  Depression (PHQ2-9): High Risk (10/10/2024)   Depression (PHQ2-9)    PHQ-2 Score: 19  Alcohol Screen: Low Risk (03/25/2024)   Alcohol Screen    Last Alcohol Screening Score (AUDIT): 0  Housing: Low Risk (07/27/2024)   Epic    Unable to Pay for Housing in the Last Year: No    Number of Times Moved in the Last Year: 0    Homeless in the Last Year: No  Utilities: Not At Risk (07/27/2024)   Epic    Threatened with loss of utilities: No  Health Literacy: Adequate Health Literacy (03/27/2024)   B1300 Health Literacy    Frequency of need for help with medical instructions: Never    Past Medical History, Surgical history, Social history, and Family history were reviewed and updated as appropriate.   Please see review of systems for further details on the patient's review from today.   Objective:   Physical Exam:  There were no vitals taken for this visit.  Physical Exam Constitutional:      General: He is not in acute distress. Musculoskeletal:        General: No deformity.  Neurological:     Mental Status: He is alert and oriented to person, place, and time.     Coordination: Coordination normal.  Psychiatric:        Attention and Perception: Attention and perception normal. He does not perceive auditory or visual hallucinations.        Mood and Affect: Mood normal. Mood is not anxious or depressed. Affect is not labile, blunt, angry or inappropriate.        Speech: Speech normal.        Behavior: Behavior normal.        Thought Content: Thought content normal. Thought content is not paranoid or delusional. Thought  content does not include homicidal or suicidal ideation. Thought content does not include homicidal or suicidal plan.        Cognition and Memory: Cognition and memory  normal.        Judgment: Judgment normal.     Comments: Insight intact     Lab Review:     Component Value Date/Time   NA 138 05/31/2024 0927   K 4.3 05/31/2024 0927   CL 100 05/31/2024 0927   CO2 19 (L) 05/31/2024 0927   GLUCOSE 109 (H) 05/31/2024 0927   GLUCOSE 104 (H) 01/26/2024 1703   BUN 12 05/31/2024 0927   CREATININE 1.17 05/31/2024 0927   CALCIUM  9.2 05/31/2024 0927   PROT 7.1 03/29/2024 0922   ALBUMIN 4.7 03/29/2024 0922   AST 24 03/29/2024 0922   ALT 47 (H) 03/29/2024 0922   ALKPHOS 128 (H) 03/29/2024 0922   BILITOT 0.4 03/29/2024 0922   GFRNONAA >60 01/26/2024 1703   GFRAA >60 06/23/2020 0620       Component Value Date/Time   WBC 9.6 07/18/2024 1109   RBC 4.85 07/18/2024 1109   HGB 14.6 07/18/2024 1109   HGB 15.6 03/29/2024 0922   HCT 43.3 07/18/2024 1109   HCT 47.5 03/29/2024 0922   PLT 306 07/18/2024 1109   PLT 290 03/29/2024 0922   MCV 89.3 07/18/2024 1109   MCV 92 03/29/2024 0922   MCH 30.1 07/18/2024 1109   MCHC 33.7 07/18/2024 1109   RDW 12.6 07/18/2024 1109   RDW 13.4 03/29/2024 0922   LYMPHSABS 2.0 03/29/2024 0922   MONOABS 0.7 08/10/2023 0844   EOSABS 0.2 03/29/2024 0922   BASOSABS 0.1 03/29/2024 0922    No results found for: POCLITH, LITHIUM    No results found for: PHENYTOIN, PHENOBARB, VALPROATE, CBMZ   .res Assessment: Plan:    Plan:  Monitoring BP  Abilify  20mg  daily to target mood instability.  Lithium  150mg  at bedtime - to target passive SI.  Wellbutrin  XL 300mg  daily - denies seizure history. Buspar  10mg  TID Adderall 30mg  daily  ADHD outside testing - tested 95 - adult ADHD. Consider referral for psychological testing.  RTC 4 weeks  25 minutes spent dedicated to the care of this patient on the date of this encounter to include pre-visit review of records, ordering of medication, post visit documentation, and face-to-face time with the patient discussing GAD, insomnia, ADHD, and BPD 1. Discussed continuing  current medication regimen. Patient remains totally disabled and unable to work - approved for disability.   Discussed potential metabolic side effects associated with atypical antipsychotics, as well as potential risk for movement side effects. Advised pt to contact office if movement side effects occur.   Discussed potential benefits, risk, and side effects of benzodiazepines to include potential risk of tolerance and dependence, as well as possible drowsiness.  Advised patient not to drive if experiencing drowsiness and to take lowest possible effective dose to minimize risk of dependence and tolerance.   Discussed potential benefits, risks, and side effects of stimulants with patient to include increased heart rate, palpitations, insomnia, increased anxiety, increased irritability, or decreased appetite.  Instructed patient to contact office if experiencing any significant tolerability issues.   There are no diagnoses linked to this encounter.   Please see After Visit Summary for patient specific instructions.  Future Appointments  Date Time Provider Department Center  10/17/2024  5:00 PM Haniah Penny Nattalie, NP CP-CP None  11/13/2024  2:00 PM Alvstad, Kristin L, RPH-CPP [facility] [id number]  04/03/2025  9:20 AM Gladis Mustard, FNP [facility] [address]    No orders of the defined types were placed in this encounter.     -------------------------------     [1]  Allergies Allergen Reactions   Hydrocodone  Itching   Hydroxyzine  Other (See Comments)    Dizziness, mental fogginess, nausea   Nifedipine  Swelling   Hydrocodone -Acetaminophen  Hives, Itching and Rash

## 2024-10-18 ENCOUNTER — Ambulatory Visit (HOSPITAL_BASED_OUTPATIENT_CLINIC_OR_DEPARTMENT_OTHER): Admitting: Pharmacist Clinician (PhC)/ Clinical Pharmacy Specialist

## 2024-10-18 ENCOUNTER — Encounter (HOSPITAL_BASED_OUTPATIENT_CLINIC_OR_DEPARTMENT_OTHER): Payer: Self-pay

## 2024-10-29 ENCOUNTER — Ambulatory Visit: Admitting: Adult Health

## 2024-11-01 ENCOUNTER — Encounter

## 2024-11-13 ENCOUNTER — Ambulatory Visit (HOSPITAL_BASED_OUTPATIENT_CLINIC_OR_DEPARTMENT_OTHER): Admitting: Pharmacist Clinician (PhC)/ Clinical Pharmacy Specialist

## 2025-04-03 ENCOUNTER — Ambulatory Visit: Payer: Self-pay | Admitting: Nurse Practitioner
